# Patient Record
Sex: Female | Born: 1950 | Race: White | Hispanic: No | Marital: Married | Smoking: Never smoker
Health system: Southern US, Community
[De-identification: ages and names within clinical notes are randomized; demographics above are authoritative.]

## PROBLEM LIST (undated history)

## (undated) DIAGNOSIS — H9190 Unspecified hearing loss, unspecified ear: Secondary | ICD-10-CM

## (undated) DIAGNOSIS — M1990 Unspecified osteoarthritis, unspecified site: Secondary | ICD-10-CM

## (undated) DIAGNOSIS — E039 Hypothyroidism, unspecified: Secondary | ICD-10-CM

## (undated) DIAGNOSIS — Z9889 Other specified postprocedural states: Secondary | ICD-10-CM

## (undated) DIAGNOSIS — I428 Other cardiomyopathies: Secondary | ICD-10-CM

## (undated) DIAGNOSIS — I4819 Other persistent atrial fibrillation: Secondary | ICD-10-CM

## (undated) DIAGNOSIS — G629 Polyneuropathy, unspecified: Secondary | ICD-10-CM

## (undated) DIAGNOSIS — K760 Fatty (change of) liver, not elsewhere classified: Secondary | ICD-10-CM

## (undated) DIAGNOSIS — M8580 Other specified disorders of bone density and structure, unspecified site: Secondary | ICD-10-CM

## (undated) DIAGNOSIS — Z953 Presence of xenogenic heart valve: Secondary | ICD-10-CM

## (undated) DIAGNOSIS — K219 Gastro-esophageal reflux disease without esophagitis: Secondary | ICD-10-CM

## (undated) DIAGNOSIS — I351 Nonrheumatic aortic (valve) insufficiency: Secondary | ICD-10-CM

## (undated) DIAGNOSIS — M7580 Other shoulder lesions, unspecified shoulder: Secondary | ICD-10-CM

## (undated) DIAGNOSIS — I071 Rheumatic tricuspid insufficiency: Secondary | ICD-10-CM

## (undated) DIAGNOSIS — I10 Essential (primary) hypertension: Secondary | ICD-10-CM

## (undated) DIAGNOSIS — Q211 Atrial septal defect: Secondary | ICD-10-CM

## (undated) DIAGNOSIS — R7303 Prediabetes: Secondary | ICD-10-CM

## (undated) DIAGNOSIS — K5900 Constipation, unspecified: Secondary | ICD-10-CM

## (undated) DIAGNOSIS — K589 Irritable bowel syndrome without diarrhea: Secondary | ICD-10-CM

## (undated) DIAGNOSIS — I2510 Atherosclerotic heart disease of native coronary artery without angina pectoris: Secondary | ICD-10-CM

## (undated) DIAGNOSIS — F419 Anxiety disorder, unspecified: Secondary | ICD-10-CM

## (undated) DIAGNOSIS — Q2112 Patent foramen ovale: Secondary | ICD-10-CM

## (undated) DIAGNOSIS — E785 Hyperlipidemia, unspecified: Secondary | ICD-10-CM

## (undated) DIAGNOSIS — I340 Nonrheumatic mitral (valve) insufficiency: Secondary | ICD-10-CM

## (undated) DIAGNOSIS — I499 Cardiac arrhythmia, unspecified: Secondary | ICD-10-CM

## (undated) DIAGNOSIS — M19012 Primary osteoarthritis, left shoulder: Secondary | ICD-10-CM

## (undated) DIAGNOSIS — I5042 Chronic combined systolic (congestive) and diastolic (congestive) heart failure: Secondary | ICD-10-CM

## (undated) DIAGNOSIS — M797 Fibromyalgia: Secondary | ICD-10-CM

## (undated) HISTORY — PX: WISDOM TOOTH EXTRACTION: SHX21

## (undated) HISTORY — PX: COLON SURGERY: SHX602

## (undated) HISTORY — DX: Nonrheumatic aortic (valve) insufficiency: I35.1

## (undated) HISTORY — DX: Other cardiomyopathies: I42.8

## (undated) HISTORY — DX: Patent foramen ovale: Q21.12

## (undated) HISTORY — DX: Atrial septal defect: Q21.1

## (undated) HISTORY — DX: Chronic combined systolic (congestive) and diastolic (congestive) heart failure: I50.42

## (undated) HISTORY — PX: TONSILLECTOMY: SUR1361

## (undated) HISTORY — PX: ESOPHAGOGASTRODUODENOSCOPY ENDOSCOPY: SHX5814

## (undated) HISTORY — PX: ABDOMINAL HYSTERECTOMY: SHX81

## (undated) HISTORY — PX: COLONOSCOPY: SHX174

## (undated) HISTORY — PX: TUBAL LIGATION: SHX77

## (undated) HISTORY — DX: Unspecified hearing loss, unspecified ear: H91.90

## (undated) HISTORY — PX: EYE SURGERY: SHX253

## (undated) HISTORY — DX: Rheumatic tricuspid insufficiency: I07.1

---

## 1989-05-01 HISTORY — PX: THYROID SURGERY: SHX805

## 1998-01-29 ENCOUNTER — Ambulatory Visit (HOSPITAL_COMMUNITY): Admission: RE | Admit: 1998-01-29 | Discharge: 1998-01-29 | Payer: Self-pay | Admitting: Obstetrics and Gynecology

## 1998-02-16 ENCOUNTER — Ambulatory Visit (HOSPITAL_BASED_OUTPATIENT_CLINIC_OR_DEPARTMENT_OTHER): Admission: RE | Admit: 1998-02-16 | Discharge: 1998-02-16 | Payer: Self-pay | Admitting: Orthopaedic Surgery

## 1998-03-01 ENCOUNTER — Other Ambulatory Visit: Admission: RE | Admit: 1998-03-01 | Discharge: 1998-03-01 | Payer: Self-pay | Admitting: Obstetrics and Gynecology

## 1998-03-18 ENCOUNTER — Encounter: Admission: RE | Admit: 1998-03-18 | Discharge: 1998-06-16 | Payer: Self-pay | Admitting: Orthopaedic Surgery

## 1998-05-01 HISTORY — PX: OTHER SURGICAL HISTORY: SHX169

## 1998-06-03 ENCOUNTER — Emergency Department (HOSPITAL_COMMUNITY): Admission: EM | Admit: 1998-06-03 | Discharge: 1998-06-03 | Payer: Self-pay | Admitting: Emergency Medicine

## 1998-11-01 ENCOUNTER — Emergency Department (HOSPITAL_COMMUNITY): Admission: EM | Admit: 1998-11-01 | Discharge: 1998-11-01 | Payer: Self-pay | Admitting: Emergency Medicine

## 1998-11-01 ENCOUNTER — Encounter: Payer: Self-pay | Admitting: Emergency Medicine

## 1998-11-07 ENCOUNTER — Emergency Department (HOSPITAL_COMMUNITY): Admission: EM | Admit: 1998-11-07 | Discharge: 1998-11-07 | Payer: Self-pay | Admitting: Emergency Medicine

## 1999-02-11 ENCOUNTER — Encounter: Admission: RE | Admit: 1999-02-11 | Discharge: 1999-02-11 | Payer: Self-pay | Admitting: Internal Medicine

## 1999-04-15 ENCOUNTER — Other Ambulatory Visit: Admission: RE | Admit: 1999-04-15 | Discharge: 1999-04-15 | Payer: Self-pay | Admitting: Obstetrics and Gynecology

## 1999-06-14 ENCOUNTER — Ambulatory Visit (HOSPITAL_COMMUNITY): Admission: RE | Admit: 1999-06-14 | Discharge: 1999-06-14 | Payer: Self-pay | Admitting: Gastroenterology

## 1999-06-14 ENCOUNTER — Encounter (INDEPENDENT_AMBULATORY_CARE_PROVIDER_SITE_OTHER): Payer: Self-pay

## 1999-06-20 ENCOUNTER — Encounter: Payer: Self-pay | Admitting: Gastroenterology

## 1999-06-20 ENCOUNTER — Ambulatory Visit (HOSPITAL_COMMUNITY): Admission: RE | Admit: 1999-06-20 | Discharge: 1999-06-20 | Payer: Self-pay | Admitting: Gastroenterology

## 1999-06-27 ENCOUNTER — Encounter: Payer: Self-pay | Admitting: Gastroenterology

## 1999-06-27 ENCOUNTER — Ambulatory Visit (HOSPITAL_COMMUNITY): Admission: RE | Admit: 1999-06-27 | Discharge: 1999-06-27 | Payer: Self-pay | Admitting: Gastroenterology

## 2000-04-03 ENCOUNTER — Ambulatory Visit (HOSPITAL_COMMUNITY): Admission: RE | Admit: 2000-04-03 | Discharge: 2000-04-03 | Payer: Self-pay | Admitting: Gastroenterology

## 2000-04-03 ENCOUNTER — Encounter: Payer: Self-pay | Admitting: Gastroenterology

## 2000-05-10 ENCOUNTER — Encounter: Payer: Self-pay | Admitting: Vascular Surgery

## 2000-05-14 ENCOUNTER — Ambulatory Visit (HOSPITAL_COMMUNITY): Admission: RE | Admit: 2000-05-14 | Discharge: 2000-05-14 | Payer: Self-pay | Admitting: Vascular Surgery

## 2000-05-14 ENCOUNTER — Encounter (INDEPENDENT_AMBULATORY_CARE_PROVIDER_SITE_OTHER): Payer: Self-pay | Admitting: *Deleted

## 2000-05-25 ENCOUNTER — Other Ambulatory Visit: Admission: RE | Admit: 2000-05-25 | Discharge: 2000-05-25 | Payer: Self-pay | Admitting: Obstetrics and Gynecology

## 2001-06-28 ENCOUNTER — Other Ambulatory Visit: Admission: RE | Admit: 2001-06-28 | Discharge: 2001-06-28 | Payer: Self-pay | Admitting: Obstetrics and Gynecology

## 2001-07-22 ENCOUNTER — Encounter: Payer: Self-pay | Admitting: Obstetrics and Gynecology

## 2001-07-22 ENCOUNTER — Ambulatory Visit (HOSPITAL_COMMUNITY): Admission: RE | Admit: 2001-07-22 | Discharge: 2001-07-22 | Payer: Self-pay | Admitting: Obstetrics and Gynecology

## 2002-07-22 ENCOUNTER — Other Ambulatory Visit: Admission: RE | Admit: 2002-07-22 | Discharge: 2002-07-22 | Payer: Self-pay | Admitting: Obstetrics and Gynecology

## 2002-08-01 ENCOUNTER — Ambulatory Visit (HOSPITAL_COMMUNITY): Admission: RE | Admit: 2002-08-01 | Discharge: 2002-08-01 | Payer: Self-pay | Admitting: Internal Medicine

## 2002-08-01 ENCOUNTER — Encounter: Payer: Self-pay | Admitting: Internal Medicine

## 2002-08-13 ENCOUNTER — Encounter: Payer: Self-pay | Admitting: Obstetrics and Gynecology

## 2002-08-13 ENCOUNTER — Ambulatory Visit (HOSPITAL_COMMUNITY): Admission: RE | Admit: 2002-08-13 | Discharge: 2002-08-13 | Payer: Self-pay | Admitting: Obstetrics and Gynecology

## 2002-09-23 ENCOUNTER — Encounter
Admission: RE | Admit: 2002-09-23 | Discharge: 2002-12-22 | Payer: Self-pay | Admitting: Physical Medicine & Rehabilitation

## 2002-09-24 ENCOUNTER — Encounter: Payer: Self-pay | Admitting: Physical Medicine & Rehabilitation

## 2002-09-24 ENCOUNTER — Ambulatory Visit (HOSPITAL_COMMUNITY)
Admission: RE | Admit: 2002-09-24 | Discharge: 2002-09-24 | Payer: Self-pay | Admitting: Physical Medicine & Rehabilitation

## 2003-01-15 ENCOUNTER — Encounter
Admission: RE | Admit: 2003-01-15 | Discharge: 2003-04-15 | Payer: Self-pay | Admitting: Physical Medicine & Rehabilitation

## 2003-02-03 ENCOUNTER — Encounter
Admission: RE | Admit: 2003-02-03 | Discharge: 2003-05-04 | Payer: Self-pay | Admitting: Physical Medicine & Rehabilitation

## 2003-04-24 ENCOUNTER — Ambulatory Visit (HOSPITAL_COMMUNITY): Admission: RE | Admit: 2003-04-24 | Discharge: 2003-04-24 | Payer: Self-pay | Admitting: Emergency Medicine

## 2003-05-07 ENCOUNTER — Encounter
Admission: RE | Admit: 2003-05-07 | Discharge: 2003-08-05 | Payer: Self-pay | Admitting: Physical Medicine & Rehabilitation

## 2003-07-06 ENCOUNTER — Ambulatory Visit (HOSPITAL_COMMUNITY): Admission: RE | Admit: 2003-07-06 | Discharge: 2003-07-06 | Payer: Self-pay | Admitting: Gastroenterology

## 2003-07-28 ENCOUNTER — Other Ambulatory Visit: Admission: RE | Admit: 2003-07-28 | Discharge: 2003-07-28 | Payer: Self-pay | Admitting: Obstetrics and Gynecology

## 2003-07-29 ENCOUNTER — Ambulatory Visit (HOSPITAL_COMMUNITY): Admission: RE | Admit: 2003-07-29 | Discharge: 2003-07-29 | Payer: Self-pay | Admitting: Obstetrics and Gynecology

## 2003-08-06 ENCOUNTER — Encounter
Admission: RE | Admit: 2003-08-06 | Discharge: 2003-11-04 | Payer: Self-pay | Admitting: Physical Medicine & Rehabilitation

## 2003-08-21 ENCOUNTER — Ambulatory Visit (HOSPITAL_COMMUNITY): Admission: RE | Admit: 2003-08-21 | Discharge: 2003-08-21 | Payer: Self-pay | Admitting: Obstetrics and Gynecology

## 2004-02-04 ENCOUNTER — Encounter
Admission: RE | Admit: 2004-02-04 | Discharge: 2004-05-04 | Payer: Self-pay | Admitting: Physical Medicine & Rehabilitation

## 2004-02-05 ENCOUNTER — Ambulatory Visit: Payer: Self-pay | Admitting: Physical Medicine & Rehabilitation

## 2004-03-30 ENCOUNTER — Encounter: Admission: RE | Admit: 2004-03-30 | Discharge: 2004-03-30 | Payer: Self-pay | Admitting: Internal Medicine

## 2004-04-06 ENCOUNTER — Encounter: Admission: RE | Admit: 2004-04-06 | Discharge: 2004-04-06 | Payer: Self-pay | Admitting: Internal Medicine

## 2004-04-15 ENCOUNTER — Encounter: Admission: RE | Admit: 2004-04-15 | Discharge: 2004-04-15 | Payer: Self-pay | Admitting: Gastroenterology

## 2004-07-04 ENCOUNTER — Ambulatory Visit (HOSPITAL_COMMUNITY): Admission: RE | Admit: 2004-07-04 | Discharge: 2004-07-04 | Payer: Self-pay | Admitting: Internal Medicine

## 2004-08-01 ENCOUNTER — Encounter
Admission: RE | Admit: 2004-08-01 | Discharge: 2004-10-30 | Payer: Self-pay | Admitting: Physical Medicine & Rehabilitation

## 2004-08-04 ENCOUNTER — Other Ambulatory Visit: Admission: RE | Admit: 2004-08-04 | Discharge: 2004-08-04 | Payer: Self-pay | Admitting: Obstetrics and Gynecology

## 2004-11-23 ENCOUNTER — Ambulatory Visit: Payer: Self-pay | Admitting: Physical Medicine & Rehabilitation

## 2004-11-23 ENCOUNTER — Encounter
Admission: RE | Admit: 2004-11-23 | Discharge: 2005-02-21 | Payer: Self-pay | Admitting: Physical Medicine & Rehabilitation

## 2005-01-24 ENCOUNTER — Ambulatory Visit: Payer: Self-pay | Admitting: Physical Medicine & Rehabilitation

## 2005-07-13 ENCOUNTER — Ambulatory Visit: Payer: Self-pay | Admitting: Physical Medicine & Rehabilitation

## 2005-07-13 ENCOUNTER — Encounter
Admission: RE | Admit: 2005-07-13 | Discharge: 2005-10-11 | Payer: Self-pay | Admitting: Physical Medicine & Rehabilitation

## 2005-10-13 ENCOUNTER — Ambulatory Visit: Payer: Self-pay | Admitting: Physical Medicine & Rehabilitation

## 2005-10-13 ENCOUNTER — Encounter
Admission: RE | Admit: 2005-10-13 | Discharge: 2006-01-11 | Payer: Self-pay | Admitting: Physical Medicine & Rehabilitation

## 2006-02-14 ENCOUNTER — Encounter: Admission: RE | Admit: 2006-02-14 | Discharge: 2006-02-14 | Payer: Self-pay | Admitting: Gastroenterology

## 2006-02-22 ENCOUNTER — Ambulatory Visit: Payer: Self-pay | Admitting: Physical Medicine & Rehabilitation

## 2006-02-22 ENCOUNTER — Encounter
Admission: RE | Admit: 2006-02-22 | Discharge: 2006-05-23 | Payer: Self-pay | Admitting: Physical Medicine & Rehabilitation

## 2006-08-08 ENCOUNTER — Encounter
Admission: RE | Admit: 2006-08-08 | Discharge: 2006-11-06 | Payer: Self-pay | Admitting: Physical Medicine & Rehabilitation

## 2006-09-05 ENCOUNTER — Ambulatory Visit: Payer: Self-pay | Admitting: Physical Medicine & Rehabilitation

## 2006-11-29 ENCOUNTER — Encounter
Admission: RE | Admit: 2006-11-29 | Discharge: 2007-02-27 | Payer: Self-pay | Admitting: Physical Medicine & Rehabilitation

## 2007-01-14 ENCOUNTER — Ambulatory Visit: Payer: Self-pay | Admitting: Physical Medicine & Rehabilitation

## 2007-02-17 ENCOUNTER — Emergency Department (HOSPITAL_COMMUNITY): Admission: EM | Admit: 2007-02-17 | Discharge: 2007-02-18 | Payer: Self-pay | Admitting: Emergency Medicine

## 2007-05-31 ENCOUNTER — Encounter: Admission: RE | Admit: 2007-05-31 | Discharge: 2007-05-31 | Payer: Self-pay | Admitting: Internal Medicine

## 2007-06-21 ENCOUNTER — Inpatient Hospital Stay (HOSPITAL_COMMUNITY): Admission: EM | Admit: 2007-06-21 | Discharge: 2007-06-25 | Payer: Self-pay | Admitting: Emergency Medicine

## 2007-06-24 ENCOUNTER — Encounter (INDEPENDENT_AMBULATORY_CARE_PROVIDER_SITE_OTHER): Payer: Self-pay | Admitting: Cardiology

## 2007-07-08 ENCOUNTER — Encounter
Admission: RE | Admit: 2007-07-08 | Discharge: 2007-07-09 | Payer: Self-pay | Admitting: Physical Medicine & Rehabilitation

## 2007-07-08 ENCOUNTER — Ambulatory Visit: Payer: Self-pay | Admitting: Physical Medicine & Rehabilitation

## 2007-07-15 ENCOUNTER — Encounter: Admission: RE | Admit: 2007-07-15 | Discharge: 2007-07-15 | Payer: Self-pay | Admitting: Endocrinology

## 2007-07-19 ENCOUNTER — Encounter: Admission: RE | Admit: 2007-07-19 | Discharge: 2007-07-19 | Payer: Self-pay | Admitting: Endocrinology

## 2007-07-26 ENCOUNTER — Ambulatory Visit: Payer: Self-pay | Admitting: Pulmonary Disease

## 2007-07-26 ENCOUNTER — Inpatient Hospital Stay (HOSPITAL_COMMUNITY): Admission: AD | Admit: 2007-07-26 | Discharge: 2007-08-04 | Payer: Self-pay | Admitting: Interventional Cardiology

## 2007-07-31 ENCOUNTER — Encounter (INDEPENDENT_AMBULATORY_CARE_PROVIDER_SITE_OTHER): Payer: Self-pay | Admitting: Pulmonary Disease

## 2007-09-11 ENCOUNTER — Encounter
Admission: RE | Admit: 2007-09-11 | Discharge: 2007-09-11 | Payer: Self-pay | Admitting: Physical Medicine & Rehabilitation

## 2007-09-11 ENCOUNTER — Ambulatory Visit: Payer: Self-pay | Admitting: Physical Medicine & Rehabilitation

## 2007-11-18 ENCOUNTER — Emergency Department (HOSPITAL_COMMUNITY): Admission: EM | Admit: 2007-11-18 | Discharge: 2007-11-18 | Payer: Self-pay | Admitting: Emergency Medicine

## 2008-01-24 ENCOUNTER — Encounter
Admission: RE | Admit: 2008-01-24 | Discharge: 2008-01-27 | Payer: Self-pay | Admitting: Physical Medicine & Rehabilitation

## 2008-01-27 ENCOUNTER — Ambulatory Visit: Payer: Self-pay | Admitting: Physical Medicine & Rehabilitation

## 2008-04-20 ENCOUNTER — Encounter
Admission: RE | Admit: 2008-04-20 | Discharge: 2008-04-20 | Payer: Self-pay | Admitting: Physical Medicine & Rehabilitation

## 2008-04-20 ENCOUNTER — Ambulatory Visit: Payer: Self-pay | Admitting: Physical Medicine & Rehabilitation

## 2008-06-20 ENCOUNTER — Encounter: Admission: RE | Admit: 2008-06-20 | Discharge: 2008-06-20 | Payer: Self-pay | Admitting: Orthopedic Surgery

## 2008-06-29 ENCOUNTER — Encounter: Admission: RE | Admit: 2008-06-29 | Discharge: 2008-06-29 | Payer: Self-pay | Admitting: Orthopedic Surgery

## 2008-08-14 ENCOUNTER — Encounter
Admission: RE | Admit: 2008-08-14 | Discharge: 2008-08-18 | Payer: Self-pay | Admitting: Physical Medicine & Rehabilitation

## 2008-08-18 ENCOUNTER — Ambulatory Visit: Payer: Self-pay | Admitting: Physical Medicine & Rehabilitation

## 2008-09-29 ENCOUNTER — Encounter: Admission: RE | Admit: 2008-09-29 | Discharge: 2008-09-29 | Payer: Self-pay | Admitting: Internal Medicine

## 2009-05-10 ENCOUNTER — Encounter
Admission: RE | Admit: 2009-05-10 | Discharge: 2009-08-08 | Payer: Self-pay | Admitting: Physical Medicine & Rehabilitation

## 2009-05-12 ENCOUNTER — Ambulatory Visit: Payer: Self-pay | Admitting: Physical Medicine & Rehabilitation

## 2009-09-02 ENCOUNTER — Encounter
Admission: RE | Admit: 2009-09-02 | Discharge: 2009-09-03 | Payer: Self-pay | Admitting: Physical Medicine & Rehabilitation

## 2009-09-03 ENCOUNTER — Ambulatory Visit: Payer: Self-pay | Admitting: Physical Medicine & Rehabilitation

## 2009-11-16 ENCOUNTER — Encounter
Admission: RE | Admit: 2009-11-16 | Discharge: 2009-11-24 | Payer: Self-pay | Admitting: Physical Medicine & Rehabilitation

## 2009-11-24 ENCOUNTER — Ambulatory Visit: Payer: Self-pay | Admitting: Physical Medicine & Rehabilitation

## 2010-03-11 ENCOUNTER — Encounter
Admission: RE | Admit: 2010-03-11 | Discharge: 2010-03-11 | Payer: Self-pay | Source: Home / Self Care | Attending: Physical Medicine & Rehabilitation | Admitting: Physical Medicine & Rehabilitation

## 2010-04-12 ENCOUNTER — Ambulatory Visit: Payer: Self-pay | Admitting: Physical Medicine & Rehabilitation

## 2010-05-22 ENCOUNTER — Encounter: Payer: Self-pay | Admitting: Gastroenterology

## 2010-06-08 ENCOUNTER — Ambulatory Visit (HOSPITAL_BASED_OUTPATIENT_CLINIC_OR_DEPARTMENT_OTHER): Payer: Medicare Other | Admitting: Physical Medicine & Rehabilitation

## 2010-06-08 ENCOUNTER — Encounter: Payer: MEDICARE | Attending: Physical Medicine & Rehabilitation

## 2010-06-08 DIAGNOSIS — M76899 Other specified enthesopathies of unspecified lower limb, excluding foot: Secondary | ICD-10-CM

## 2010-06-08 DIAGNOSIS — F3289 Other specified depressive episodes: Secondary | ICD-10-CM | POA: Insufficient documentation

## 2010-06-08 DIAGNOSIS — M47812 Spondylosis without myelopathy or radiculopathy, cervical region: Secondary | ICD-10-CM

## 2010-06-08 DIAGNOSIS — M542 Cervicalgia: Secondary | ICD-10-CM | POA: Insufficient documentation

## 2010-06-08 DIAGNOSIS — M5382 Other specified dorsopathies, cervical region: Secondary | ICD-10-CM

## 2010-06-08 DIAGNOSIS — IMO0001 Reserved for inherently not codable concepts without codable children: Secondary | ICD-10-CM | POA: Insufficient documentation

## 2010-06-08 DIAGNOSIS — F329 Major depressive disorder, single episode, unspecified: Secondary | ICD-10-CM | POA: Insufficient documentation

## 2010-06-08 DIAGNOSIS — Z9181 History of falling: Secondary | ICD-10-CM | POA: Insufficient documentation

## 2010-06-08 DIAGNOSIS — R51 Headache: Secondary | ICD-10-CM | POA: Insufficient documentation

## 2010-08-03 ENCOUNTER — Encounter: Payer: MEDICARE | Attending: Physical Medicine & Rehabilitation | Admitting: Physical Medicine & Rehabilitation

## 2010-08-03 DIAGNOSIS — IMO0001 Reserved for inherently not codable concepts without codable children: Secondary | ICD-10-CM | POA: Insufficient documentation

## 2010-08-03 DIAGNOSIS — M545 Low back pain, unspecified: Secondary | ICD-10-CM | POA: Insufficient documentation

## 2010-08-03 DIAGNOSIS — M47812 Spondylosis without myelopathy or radiculopathy, cervical region: Secondary | ICD-10-CM

## 2010-08-03 DIAGNOSIS — Z79899 Other long term (current) drug therapy: Secondary | ICD-10-CM | POA: Insufficient documentation

## 2010-08-03 DIAGNOSIS — F329 Major depressive disorder, single episode, unspecified: Secondary | ICD-10-CM | POA: Insufficient documentation

## 2010-08-03 DIAGNOSIS — M76899 Other specified enthesopathies of unspecified lower limb, excluding foot: Secondary | ICD-10-CM | POA: Insufficient documentation

## 2010-08-03 DIAGNOSIS — F3289 Other specified depressive episodes: Secondary | ICD-10-CM | POA: Insufficient documentation

## 2010-08-03 DIAGNOSIS — M7530 Calcific tendinitis of unspecified shoulder: Secondary | ICD-10-CM

## 2010-08-03 DIAGNOSIS — M25519 Pain in unspecified shoulder: Secondary | ICD-10-CM | POA: Insufficient documentation

## 2010-08-15 ENCOUNTER — Other Ambulatory Visit: Payer: Self-pay | Admitting: Physical Medicine & Rehabilitation

## 2010-08-15 DIAGNOSIS — M25512 Pain in left shoulder: Secondary | ICD-10-CM

## 2010-08-18 ENCOUNTER — Ambulatory Visit (HOSPITAL_COMMUNITY)
Admission: RE | Admit: 2010-08-18 | Discharge: 2010-08-18 | Disposition: A | Discharge status: Home / Self Care | Payer: MEDICARE | Source: Ambulatory Visit | Attending: Physical Medicine & Rehabilitation | Admitting: Physical Medicine & Rehabilitation

## 2010-08-18 DIAGNOSIS — M719 Bursopathy, unspecified: Secondary | ICD-10-CM | POA: Insufficient documentation

## 2010-08-18 DIAGNOSIS — M25519 Pain in unspecified shoulder: Secondary | ICD-10-CM | POA: Insufficient documentation

## 2010-08-18 DIAGNOSIS — M67919 Unspecified disorder of synovium and tendon, unspecified shoulder: Secondary | ICD-10-CM | POA: Insufficient documentation

## 2010-08-18 DIAGNOSIS — M25419 Effusion, unspecified shoulder: Secondary | ICD-10-CM | POA: Insufficient documentation

## 2010-08-18 DIAGNOSIS — M25512 Pain in left shoulder: Secondary | ICD-10-CM

## 2010-08-26 ENCOUNTER — Encounter: Payer: MEDICARE | Attending: Neurosurgery | Admitting: Neurosurgery

## 2010-08-26 DIAGNOSIS — M719 Bursopathy, unspecified: Secondary | ICD-10-CM | POA: Insufficient documentation

## 2010-08-26 DIAGNOSIS — M549 Dorsalgia, unspecified: Secondary | ICD-10-CM | POA: Insufficient documentation

## 2010-08-26 DIAGNOSIS — M25519 Pain in unspecified shoulder: Secondary | ICD-10-CM | POA: Insufficient documentation

## 2010-08-26 DIAGNOSIS — M67919 Unspecified disorder of synovium and tendon, unspecified shoulder: Secondary | ICD-10-CM | POA: Insufficient documentation

## 2010-08-26 DIAGNOSIS — M5380 Other specified dorsopathies, site unspecified: Secondary | ICD-10-CM | POA: Insufficient documentation

## 2010-08-26 DIAGNOSIS — R209 Unspecified disturbances of skin sensation: Secondary | ICD-10-CM | POA: Insufficient documentation

## 2010-08-26 DIAGNOSIS — M542 Cervicalgia: Secondary | ICD-10-CM | POA: Insufficient documentation

## 2010-08-26 DIAGNOSIS — M25529 Pain in unspecified elbow: Secondary | ICD-10-CM

## 2010-08-26 DIAGNOSIS — R5381 Other malaise: Secondary | ICD-10-CM | POA: Insufficient documentation

## 2010-08-26 DIAGNOSIS — M25419 Effusion, unspecified shoulder: Secondary | ICD-10-CM | POA: Insufficient documentation

## 2010-08-26 DIAGNOSIS — K5900 Constipation, unspecified: Secondary | ICD-10-CM | POA: Insufficient documentation

## 2010-08-26 DIAGNOSIS — R5383 Other fatigue: Secondary | ICD-10-CM | POA: Insufficient documentation

## 2010-08-26 DIAGNOSIS — M19029 Primary osteoarthritis, unspecified elbow: Secondary | ICD-10-CM

## 2010-08-27 NOTE — Assessment & Plan Note (Signed)
Account [id number].  This is a patient of Dr. Riley Kill who has been seen for some time, looks like lastly in early April with some shoulder problems.  She had had a fall in [employer] prior to that time, and she had had resulting left shoulder pain, back pain, and some neck pain.  She does have an attorney that has represented her.  She rates her pain today at about 7, varies from time to time, her general activity level is about 2.  Pain is worse in the morning and in the evening.  She sleeps pretty good.  Pain is worse with most of all activities.  Rest and medications tend to help.  She is disabled.  REVIEW OF SYSTEMS:  Notable for some weight loss, constipation, tingling, spasms in her back, and some weakness, otherwise unremarkable.  Primary physician is Dr. Rene Paci.  Orthopedist, Dr. Prince Rome.  Social history and family history unchanged.  PHYSICAL EXAMINATION:  VITAL SIGNS:  Blood pressure 124/43.  Pulse 63, respirations 18, O2 sats 98% on room air. MUSCULOSKELETAL:  Shows her to be about 3/5 strength in the upper extremities.  She gives way to pain.  She has a very decreased range of motion bilaterally in her shoulders.  She cannot abduct her arms very well laterally, reaching behind her is very painful. NEUROLOGICAL:  She is alert and oriented x3.  Her affect appears to be bright.  Gait is normal.  Constitutionally, she is within normal limits.  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  The patient did undergo an MRI on August 18, 2010, which shows rotator cuff tendon is intact.  She has mild infraspinatus and supraspinatus tendinopathy and tendinosis.  The long head of the biceps tendon is intact.  There are some degenerative changes without any discrete labral tears.  There is moderate-sized glenohumeral joint effusion and shows nodular thickening with a significant synovial mass surrounding biceps tendon and the bicipital groove, this is on the left.  Also the  findings could be suspicious for pigmented villonodular synovitis.  This also could be a sequelae of hemarthrosis.  AC joint degenerative changes lateral downsloping the acromion may be causing some impingement, mild rotator cuff tendinopathy, but no tear.  Again, glenohumeral joint effusion and synovial thickening.  She is requested to go back to see Dr. Lavada Mesi for all of this as he is her orthopedist and has been in the past.  We will go ahead and refer her back over to him.  If she has any difficulty in the interim, she is to call, otherwise, we will see her back in a month as scheduled.  She would like a copy of this sent to her attorney who is Larence Penning.     Carey Johndrow L. Blima Dessert    RLW/MedQ D:  08/26/2010 14:59:56  T:  08/27/2010 04:58:07  Job #:  [id number]

## 2010-08-30 NOTE — Assessment & Plan Note (Signed)
Allison [name] is back regarding her multiple pain issues, particularly her problems after her fall.  Therapy has been working on her neck and shoulders.  She has noted increasing pain in the left shoulder and is having problems with lifting the left arm.  She reports pain radiating from the arm down to the hand.  She is having some similar symptoms in the right but less severe.  Pain is sharp, stabbing, and aching.  Her pain medication is not helping her with her symptoms.  REVIEW OF SYSTEMS:  Notable for spasm, tingling, numbness, weakness, weight gain, constipation.  Full 12-point review is in the written health and history section of the chart.  SOCIAL HISTORY:  Unchanged.  She remains married.  PHYSICAL EXAMINATION:  VITAL SIGNS:  Blood pressure is 146/49, pulse 63, respiratory rate 18, she is satting 97% on room air. NEUROLOGIC:  The patient has some pain in her left upper trapezius on the left side particularly at the C4-C5 levels.  She is less tender and spastic on the right.  Rotation and forward flexion tend to exacerbate her neck symptoms.  Left shoulder was painful with apprehension testing as well as rotator cuff impingement testing as she had a hard time lifting the left arm in abduction.  Right arm was slightly tender to palpation over the acromion a subacromial space.  She was able to lift the arm more comfortably and had some mild pain with impingement testing.  Bicipital tendons may have been tender on the left side, lesser so on the right.  Both shoulders were diffusely tender as a whole.  Spurling test was negative.  Reflexes were 2+ and besides her shoulder strength was 5/5 in all upper extremity muscle testing.  ASSESSMENT: 1. Fibromyalgia syndrome. 2. Depression. 3. Recent fall with head trauma, whiplash injury.  The patient with     myofascial pain on the left trapezius muscle today.  She may have     some underlying facet arthropathy as well. 4. History of bilateral  greater trochanter bursitis. 5. History of low back pain. 6. Bilateral shoulder pain, left much more than right.  Exam on the     left side is concerning for rotator cuff tear today.  Symptoms are     less severe in the right and more likely only a tendonitis and     bursitis.  PLAN: 1. After informed consent, I injected both shoulders via lateral     approach of 40 mg Kenalog and 3 mL of 1% lidocaine.  The patient     tolerated it well and had some pain relief when she left the office     today. 2. I injected the left trapezius at 2 separate locations with trigger     point injections.  We used 2 mL of 1% lidocaine at each location.     The patient tolerated it well, had no adverse responses and really     did a good job with her injections today. 3. We will increase Celebrex temporarily to b.i.d.  She uses her     narcotic medication including hydrocodone for breakthrough     symptoms. 4. I will send her for an MRI of the left shoulder without contrast to     assess the rotator cuff region and to look for     any tears.  I asked that therapy hold off on exercises to that area     until we receive the results of her test. 5. I will  see her back pending the above.     Ranelle Oyster, M.D. Electronically Signed    ZTS/MedQ D:  08/03/2010 12:27:59  T:  08/04/2010 01:37:58  Job #:  [id number]

## 2010-09-13 NOTE — Assessment & Plan Note (Signed)
Allison [name] is back regarding her fibromyalgia.  Her main complaints were  ongoing hip pain as well as swelling in the legs.  She has had some  persistent nausea for which she is seeing GI at [facility] and still  has no diagnosis.  She is using Darvocet or Vicodin for breakthrough  pain.  She also likes Celebrex for days when she is more achy.  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  She states that she  talked to her family doctor about the lower extremity swelling and it  has really happened more recently and then once earlier in the summer,  perhaps in June.  Elevation and local measures were recommended.   REVIEW OF SYSTEMS:  Notable for the above.  The patient reports  occasional tingling in the legs as well as constipation at times, some  coughing.  Other pertinent positives are above.   SOCIAL HISTORY:  The patient is married and husband is supportive.   PHYSICAL EXAMINATION:  Blood pressure is 152/57, pulse is 99,  respiratory rate is 20.  Her saturation is 97% on room air.  The patient  is pleasant.  Alert and oriented x3.  Affect is bright and appropriate.  Gait is stable.  She has some ongoing tenderness in the bilateral  greater trochanter regions.  She has good extremity strength and  sensation throughout.  She has trace 1+ edema of both lower extremities  of a pitting type.  No spasticity is seen in the legs.  Cognitively she  is appropriate.  HEART:  Regular.  CHEST:  Clear.  ABDOMEN:  Soft, nontender.   ASSESSMENT:  1. Fibromyalgia syndrome.  2. Depression.  3. Myofascial cervical pain.  4. Bilateral greater trochanteric bursitis.  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  6. History of plantar fasciitis.  7. Lower extremity edema.   PLAN:  1. Discussed preventative measures with her legs including elevation,      TED stockings, etc.  The Celebrex certainly may add to this as      well.  2. After informed consent we injected both hips  via the lateral      approach using 40 mg of Kenalog and 3 mL of 1% lidocaine.  The      patient tolerated it well.  3. Continue activity and range of motion as tolerated.  4. I will see her back in about 6 months for follow up.  In general      she is doing fairly well.      Ranelle Oyster, M.D.  Electronically Signed     ZTS/MedQ  D:  01/14/2007 11:02:40  T:  01/14/2007 11:27:43  Job #:  [id number]

## 2010-09-13 NOTE — Assessment & Plan Note (Signed)
Allison [name] is back regarding multiple pain complaints.  She states that she saw  Dr. Lajoyce Corners recently for increased low back pain.  He states that her pain  in her back and leg are due to the back issues.  There was also some  question of her cervical problems as well.  Apparently MRI was done in  both areas.  She has not been able to pursue a lot of her activities as  she usually does.  She has complained of lot of anxiety and depression  recently.  She ran out of her Zoloft about 2 weeks ago or so.  The  patient's pain score today was 6/10.  Pain is described as burning and  aching.  Burning specifically is notable in her feet.  She uses  Neurontin here and there for pain as well as Vicodin.  We injected her  hips on many occasions in the past.   REVIEW OF SYSTEMS:  The patient reports tingling, spasms, anxiety,  constipation, nausea, shortness of breath, and weight gain.  Other  pertinent positives as above.  Full review is in the written health and  history section of the chart.   SOCIAL HISTORY:  The patient is married, and she and her husband are  looking for a new home.   PHYSICAL EXAMINATION:  VITAL SIGNS:  Blood pressure is 152/71, pulse is  58, respiratory rate 18, and she is sating 96% on room air.  NEUROLOGIC:  The patient is pleasant, alert, and oriented x3.  Affect is  bright and appropriate.  Cognitively she is intact.  She is very anxious  and tearful at times in the office today.  MUSCULOSKELETAL:  Gait is generally stable.  No obvious antalgia.  She  had some pain over the PSIS areas bilaterally and Allison [name] test was  equivocal and positive.  Compression test was equivocal.  She had  essentially normal range of motion in the legs, although pain with  hamstring stretching.  Straight leg testing was negative.  Strength is  5/5 with 2+ reflexes and normal sensation in both legs.  Posture is  fair.  She has gained a bit of weight, but not much over her baseline.  HEART:  Regular.  CHEST:  Clear.  ABDOMEN:  Soft and nontender.   ASSESSMENT:  1. Fibromyalgia syndrome.  2. Depression.  3. Hypothyroidism.  4. Bilateral trochanteric bursitis.  5. Low back pain with likely spondylosis and degenerative disk      disease.  I think a lot of her problems are also made related to      sacroiliitis.   PLAN:  1. I would like to get the patient in physical therapy.  First we need      to review her MRI studies.  We will ask Dr. Lajoyce Corners for these.  2. Resume Zoloft 100 mg daily.  3. I would like the patient to resume her Neurontin 100 mg b.i.d.      schedule.  We described that today.  4. I will see her back in about a month's time.  I think certainly      that her new pain in the back and legs is correctable situation to      a certain extent.  Lot of her pain is appearing to be slight      anxiety mediated.     Ranelle Oyster, M.D.  Electronically Signed    ZTS/MedQ  D:  08/18/2008 13:12:37  T:  08/19/2008 02:14:17  Job #:  [id number] 

## 2010-09-13 NOTE — Consult Note (Signed)
Allison [name], Allison [name]              ACCOUNT NO.:  [id number]   MEDICAL RECORD NO.:  [id number]          PATIENT TYPE:  INP   LOCATION:  2925                         FACILITY:  [facility]   PHYSICIAN:  Corky Crafts, MDDATE OF BIRTH:  1951-02-10   DATE OF CONSULTATION:  DATE OF DISCHARGE:                                 CONSULTATION   REASON FOR CONSULTATION:  1. Atrial fibrillation with rapid ventricular response.  2. Thyroid disease.  3. Hypertension.   HISTORY OF PRESENT ILLNESS:  The patient is a 60 year old woman who has  no known prior cardiac history.  She had been experiencing fatigue and  dyspnea for the past 2 weeks. She has also been feeling palpitations.  She saw Dr. Donette Larry in the office today because of problems related to  irritable bowel syndrome.  She had an irregular heart beat on exam and  an EKG showed atrial fibrillation with rapid ventricular response.  Her  ventricular rate was about 160 beats per minute.  She was sent to the  emergency room.  We are asked to see her in consultation   She notes intermittently that she has had palpitations for really a few  months and even more rarely palpitations longer than that.  She has not  had chest pain.  She was scheduled to have her thyroid evaluated by Dr.  Zachery Dakins.  She has had a partial thyroidectomy in the past.  Currently  she is not short of breath.  She does feel her heart racing and her  blood pressure is elevated although she does not carry a diagnosis of  high blood pressure.   PAST MEDICAL HISTORY:  1. Congenital hearing loss t.i.d.  2. IBS.  3. GERD.  4. Partial thyroidectomy in 1991.  5. Bilateral cataract removal.   ALLERGIES:  1. SULFA.  2. LEVAQUIN.  3. BIAXIN.  4. MACROBID.  5. TOPAMAX.  6. DOXYCYCLINE.  7. NSAIDS.  8. EFFEXOR.  9. TRAZODONE.  10.LEXAPRO.  11.MIRTAZAPINE.   MEDICATIONS:  Caltrate, Benadryl, Tums, lorazepam p.r.n., Ambien p.r.n.,  Darvocet p.r.n., Vicodin p.r.n.,  Celebrex p.r.n.,  Cymbalta, Xanax,  Phenergan,  B12, Mucinex nasal spray, Sudafed cough suppressant, Nexium  40 mg a day, Singulair, Flexeril, Nasocort, hyoscyamine   SOCIAL HISTORY:  She does not smoke.  She does not drink.  She does not  use any illegal drugs.   FAMILY HISTORY:  Father had a history of an irregular heartbeat.   REVIEW OF SYSTEMS:  Significant for the palpitations, fatigue, and  abnormal bowel pattern. No bleeding problems.  No focal weakness.  No  rash.  No chest pain.  No orthopnea.  No PND.  All other systems  negative.   PHYSICAL EXAMINATION:  Blood pressure 150/55, heart rate ranging  anywhere from 160 to 200 beats a minute, respiratory rate of 20.  GENERAL:  She is awake, alert, in no apparent distress.  HEAD:  Normocephalic, atraumatic.  EYES:  Extraocular movements intact.  NECK:  No JVD.  CARDIOVASCULAR:  Tachycardiac, S1-S2.  LUNGS:  Clear to auscultation bilaterally.  ABDOMEN:  Soft, mild tenderness with palpation.  No rebound.  No  guarding.  EXTREMITIES:  Showed trace pretibial edema.  NEURO:  No focal motor deficits.  She is deaf and communicates with the  help of sign language as well as lip breathing.  SKIN:  No rash.  BACK:  No kyphosis.  PSYCHIATRIC:  She is anxious.   EKG shows atrial fibrillation with rapid ventricular response.  Nonspecific ST-segment changes.   Lab work is pending.   Chest x-ray is pending.   MEDICAL DECISION MAKING:  A 60 year old with atrial fibrillation with  rapid ventricular response.   PLAN:  1. For rate control she is on IV Cardizem.  She did respond somewhat      to a dose of IV Lopressor. Will give her metoprolol 25 mg b.i.d.  2. In terms of anticoagulation, she is on heparin.  She actually may      be a long-term aspirin candidate given her young age and lack of      risk factors.  I will have to see what her happens with her blood      pressure; for now would use aspirin 325 mg.  3. She needs her  thyroid function checked.  Certainly this atrial      fibrillation could be in response to problems with her thyroid.  4. Use Cardizem for blood pressure control for now.  5. Irritable bowel syndrome symptoms per Dr. Donette Larry.  6. We also counseled her in terms of not taking stimulants such as      Sudafed.  7. She should be in a step-down unit.      Corky Crafts, MD  Electronically Signed     JSV/MEDQ  D:  06/21/2007  T:  06/22/2007  Job:  [id number]

## 2010-09-13 NOTE — H&P (Signed)
Allison [name], Allison [name]              ACCOUNT NO.:  [id number]   MEDICAL RECORD NO.:  [id number]          PATIENT TYPE:  INP   LOCATION:  2925                         FACILITY:  [facility]   PHYSICIAN:  [name], MD      DATE OF BIRTH:  08-21-1950   DATE OF ADMISSION:  06/21/2007  DATE OF DISCHARGE:  04/02/2007                              HISTORY & PHYSICAL   CHIEF COMPLAINT:  Shortness of breath and palpitations.  The patient is  60 year old, was seen in the office the first time, past medical history  of IBS, GERD, fibromyalgia and anxiety and congenital hearing deficit,  comes in with her mom.  She has been having some shortness of breath and  heart beating real fast.  She said it went on, on and off for some time  and also has history of longstanding GI problems with vomiting, and when  she bends down.  Extensive workup done in the past unremarkable, as far  as in the office, her heart rate of 160s. No chest pain, no fevers, no  cough, no abdominal pain, admitted for evaluation and for her atrial  fibrillation.   PAST MEDICAL HISTORY:  She has had medication list:  1. Lorazepam 2 mg at bedtime p.r.n.  2. Darvocet 100 p.r.n.  3. Betonica 25 mg as needed for urination.  4. Climara 0.075 once a week.  5. Nexium 40 mg daily.  6. Singulair 10 mg daily.  7. Celebrex 200 mg mg p.r.n.  8. Cymbalta 60 mg daily.  9. Levsin p.r.n.  10.Nasacort nasal spray.   PAST SURGICAL HISTORY:  Hysterectomy, partial thyroid surgery 91,  cataract removed, tubal ligation, and tonsillectomy.   ALLERGIES:  AS FAR AS ALLERGIES.  SHE HAS ALLERGIES TO SULFA, LEVAQUIN,  BIAXIN AND MACROBID.  SHE IS INTOLERANT TO NONSTEROIDALS AND TOPAMAX AND  TRAZODONE AND SSRIS.   REVIEW OF SYSTEMS:  As above.   EXAM:  VITAL SIGNS:  Blood pressure 130/80, heart rate of 160,  temperature 92, pulse ox 98% on room air, weight of 174 pounds.  GENERAL:  Well-appearing female with congenital hearing loss with little  difficult to understand at times.  LUNGS:  Was clear.  CARDIOLOGY:  Irregular, normal S1, S2, tachy.  No JVD.  ABDOMEN:  Soft  without tenderness.  Extremities: No edema.  No thyromegaly.  She has  scar from thyroid surgery.   LAB DATA:  Will get blood chemistries, thyroid, CBC and cardiac markers.  EKG showed atrial fibrillation with rate of 165 with a-fib.   ASSESSMENT/PLAN:  1. Shortness of breath and palpitation with atrial fibrillation, rapid      ventricular  response.  2. Irritable bowel syndrome and gastroesophageal reflux disease.  3. Fibromyalgia.  4. Anxiety.   PLAN:  Admit to telemetry at [facility].  Will start him on diltiazem  drip to control the rate, IV heparin, get cardiac markers and blood  work, check a thyroid, cardiology consult, possibly need an echo.  On  the IBS and GERD, continue on Nexium and fibromyalgia, continue on  Cymbalta and lorazepam.  I have discussed  with cardiology Dr. Eldridge Dace,  will see the patient this afternoon.      [name], MD  Electronically Signed     KH/MEDQ  D:  06/21/2007  T:  06/22/2007  Job:  [id number]

## 2010-09-13 NOTE — Assessment & Plan Note (Signed)
Allison [name] is back regarding her fibromyalgia and chronic hip pain.  She  continues to have hip pain in the greater trochanter regions  bilaterally.  This tends to flare up every 3-4 months or so.  She has  had back spasms flare after shoveling snow on Saturday and is still a  bit sore there.  We talked about stretching exercises, and she really is  not stretching on a regular basis in an organized manner.  The patient  rates her pain at 5/10, described as constant and aching.  The pain  interferes with general activities, relationship with others, and  enjoyment of life on a moderate level.  Sleep is fair.  She uses Vicodin  for breakthrough pain.   REVIEW OF SYSTEMS:  Notable for numbness, tingling, spasms, anxiety,  some weight gain, and shortness of breath.  Other pertinent  positives  are above.  Full review is in the written health and history section in  the chart.   SOCIAL HISTORY:  The patient is married and living with her husband.   PHYSICAL EXAMINATION:  VITAL SIGNS:  Blood pressure is 132/71, pulse is  59, respiratory rate 18, and she is sating 96% on room air.  GENERAL:  The patient is pleasant, alert, and oriented x3.  MUSCULOSKELETAL:  She walks with a normal gait.  She is able to nearly  bend her waist to 90 degrees.  She had some pain in both PSIS areas and  lower lumbar paraspinals.  Both greater trochanter regions were tender  with palpation and cross-legged maneuvers.  Strength remains 5/5 and  sensation is normal.  Cognitively, she is intact.  HEART:  Regular.  CHEST:  Clear.  ABDOMEN:  Soft and nontender.   ASSESSMENT:  1. Fibromyalgia syndrome.  2. Depression.  3. Hypothyroidism.  4. Bilateral trochanteric bursitis.  5. Low back muscle strain.   PLAN:  1. After informed consent, we injected both the greater trochanters      today with 3 mL of 1% lidocaine and 40 mg of Kenalog.  The patient      tolerated it well.  2. Continue Vicodin for breakthrough  pain.  3. I will see her back in 3 months' time.  In general, she is doing      very well.  I did give her a formal exercise to try for her back      and core muscles.  She may need a formal therapy to reiterate.      Ranelle Oyster, M.D.  Electronically Signed     ZTS/MedQ  D:  04/20/2008 12:37:48  T:  04/21/2008 02:40:55  Job #:  [id number]

## 2010-09-13 NOTE — Assessment & Plan Note (Signed)
Kinsleigh is back regarding her fibromyalgia and chronic hip pain.  She has  had some pain in her neck over the last week or so after repositioning  in bed due to upper respiratory tract infection.  The pain usually is  over the shoulder and lower neck region.  It is most often centralized  without pain into either shoulder.  Her hips are beginning to bother her  a bit again.  She has been working in the yard quite a lot and thinks  this has exacerbated symptoms.   REVIEW OF SYSTEMS:  Notable for the above.  She still has some anxiety,  recent weight gain, occasional constipation, and her upper respiratory  symptoms.  Otherwise she is stable.   SOCIAL HISTORY:  Unchanged.  She is married and doing well with her  husband.   PHYSICAL EXAMINATION:  VITAL SIGNS:  Blood pressure is 114/52, pulse is  54, respiratory rate 20, and she is saturating 99% on room air.  GENERAL:  The patient is pleasant, alert and oriented x3.  Affect is  bright and appropriate.  She has some pain over both greater trochanters  with radiation into the knees.  No pain with straight leg raising or  ranging of the back.  She has some pain over the spinous process of C6-7  without obvious muscle spasm or taut bands appreciated today.  She does  have a head forward posture and rotation internally of the shoulders.  HEART:  Regular.  CHEST:  Clear.  ABDOMEN:  Soft and nontender.   ASSESSMENT:  1. Fibromyalgia syndrome.  2. Depression.  3. Hypothyroidism.  4. Bilateral greater trochanter bursitis.  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  6. Plantar fasciitis.   PLAN:  1. After informed consent, we injected both greater trochanters with      40 mg of Kenalog and 3 mL of 1% lidocaine.  The patient tolerated      it well.  2. I gave the patient a sample of Lidoderm patches to place over her      neck region.  I think most of this is positional at this point and      should improve with stretching and  range of motion and improved      posture.  3. Continue Darvocet or Vicodin for breakthrough pain.  4. I will see her back at her prior scheduled appointment in 3-4      months.      Ranelle Oyster, M.D.  Electronically Signed     ZTS/MedQ  D:  09/11/2007 12:30:25  T:  09/11/2007 12:42:46  Job #:  [id number]

## 2010-09-13 NOTE — H&P (Signed)
[name], [name]              ACCOUNT NO.:  [id number]   MEDICAL RECORD NO.:  [id number]          PATIENT TYPE:  INP   LOCATION:  2032                         FACILITY:  [facility]   PHYSICIAN:  Corky Crafts, MDDATE OF BIRTH:  [birth year]   DATE OF ADMISSION:  07/26/2007  DATE OF DISCHARGE:                              HISTORY & PHYSICAL   CHIEF COMPLAINT:  Shortness of breath.   Allison [name] is a pleasant 60 year old female patient of Dr. Fredric [name] with a 4- to 5-week history of atrial fibrillation felt to be  secondary to hyperthyroidism.  She was initially hospitalized for this  on June 21, 2007.  She was placed on beta blocker therapy at that  time as well as Cardizem.  Rate improved and she was later discharged.  Due to her young age and low CHADS score, she was not placed on  Coumadin.   The patient underwent radioactive iodine treatment on July 19, 2007,  and has done fairly well.  However, heart rate has continued to be  elevated and the last week she has had increasing dyspnea and inability  to lie flat due to her breathing.  She has also had some increasing  fatigue.  She is being admitted for atrial fibrillation with rapid  ventricular rate with increased dyspnea.   REVIEW OF SYSTEMS:  As above.  Denies any fever or chills.  HEENT:  Congenital deafness.  Does read lips.  Has had some mouth soreness on  the right side of her tongue.  Is currently on a mouthwash for this.  No  sore throat.  NECK:  Feels like she is choking at times.  Thyroid gland  is enlarged.  Thyroid nontender.  CHEST:  Has had some palpitations.  Has had no chest pain or pressure.  LUNGS:  As above.  She is unable to  speak due to difficulty of breathing.  She has PND and orthopnea.  No  cough or wheezing.  No recent URI.  ABDOMEN:  Denies pain, nausea,  vomiting, diarrhea, constipation, melena or hematochezia.  GU:  Recent  UTI, placed on Keflex, symptoms improving.  EXTREMITIES:   Denies  significant edema.  No numbness, tingling.  No headaches, seizures,  memory loss.  Occasional lightheadedness but no presyncope or frank  syncope.   PAST MEDICAL HISTORY:  1. Atrial fibrillation with RVR 4-5 weeks.  2. Hyperthyroidism, received radioactive iodine treatment on July 19, 2007.  3. Congenital deafness.  4. Anxiety/depression.  5. CHF.  6. GERD.  7. IBS.   PAST SURGICAL HISTORY:  1. Partial thyroidectomy, 1991.  2. Bilateral cataract removal.   SOCIAL HISTORY:  Nonsmoker, does not drink.  She is married.  No illicit  drug use.  Mother involved in her care.   FAMILY HISTORY:  Father had a history of irregular heartbeat.   CURRENT MEDICATIONS:  1. Nexium 40 mg a day.  2. Climara patch 0.075 mg weekly.  3. Cymbalta 60 mg daily.  4. Xanax 0.25 mg b.i.d. p.r.n.  5. Calcium with D b.i.d.  6. Singulair 10  mg a day.  7. Atenolol 50 mg one in the morning and two in the evening.  8. __________  question dose, one a day.  9. Levsin 0.125 mg t.i.d. p.r.n.  10.Lasix 40 mg b.i.d.  11.Potassium chloride 20 mEq daily.  12.Aspirin 325 mg daily.  13.Zantac 150 mg daily.  14.Cartia XT 120 mg daily.  15.Ambien 5 mg at bedtime p.r.n. sleep.  16.Cephalexin 500 mg b.i.d. x10 days.  17.Cyclobenzaprine 5 mg p.r.n.  18.Nystatin mouthwash t.i.d.   DRUG ALLERGIES:  Numerous:  1. SULFA.  2. LEVAQUIN.  3. BIAXIN.  4. MACROBID.  5. TOPAMAX.  6. DOXYCYCLINE.  7. NSAIDS.  8. EFFEXOR.  9. TRAZODONE.  10.LEXAPRO.  11.MIRTAZAPINE.   OBJECTIVE DATA:  Weight is 182.4 up from 172.8 on July 16, 2007.  Pulse  is 108 and irregular.  Blood pressure is 100/80.  GENERAL:  Very pleasant, no acute distress.  She is able to communicate  well with lip reading.  SKIN:  Warm and dry.  HEENT:  Ears, nose and throat unremarkable.  NECK:  Small goiter present.  It is nontender.  No carotid bruits are  auscultated.  There is JVD when the patient lies flat.  CHEST:  Rapid S1, S2.   Ventricular rate 120 beats per minute.  I do not  hear a murmur or rub.  LUNGS:  Tachypneic, respiratory rate at 28 breaths per minute.  She has  decreased breath sounds in the right mid to lower lung area.  ABDOMEN:  Nontender.  GU/RECTAL:  Deferred.  EXTREMITIES:  Moves all extremities x4.  Strength equal bilaterally.  Distal pulses +2.  NEURO:  Strength equal bilaterally.  Neurologic nonfocal.  INTEGUMENT:  Faint macular rash on the lower arms and legs, suspect drug  rash, question Keflex.   IMPRESSION:  1. Atrial fibrillation with rapid ventricular rate.  2. Congestive heart failure as a result of #1.  3. Increasing dyspnea and weakness.  4. Hyperthyroidism, status post radioactive iodine treatment.  5. History of anxiety/depression.  6. Current urinary tract infection.   PLAN:  Will admit to telemetry.  Obtain isoenzymes, labs including TSH.  Start on Cardizem drip after IV bolus.  Monitor pulmonary status.  May  increase Lasix dose based on BNP, chest x-ray.  We will obtain a UA with  admission orders due to a previous history of UTI.  I think her rash is  most likely due to the Keflex.  Dr. Eldridge Dace to follow.      Tamera C. Melvyn Neth, N.P.      Corky Crafts, MD  Electronically Signed    TCL/MEDQ  D:  07/26/2007  T:  07/27/2007  Job:  [id number]

## 2010-09-13 NOTE — Assessment & Plan Note (Signed)
Allison [name] is back regarding her fibromyalgia and chronic hip pain.  She has  had some more pain in the greater trochanteric regions over the last few  weeks.  She has been active in the yard again and this seems to  exacerbate symptoms.  She does try to ice and rest the legs and stretch  but pain has been slowly increasing again.  She has some pain in her  feet as well, usually associated with more walking and exercise.  Neck  is generally stable.  Mood has been good.  She uses Vicodin for  breakthrough pain primarily.  The patient rates her pain at 5/10 today  and describes it as aching.  Pain interferes with general activity,  relations with others, enjoyment of life on a moderate level.   REVIEW OF SYSTEMS:  Notable for spasms in the back and legs.  Otherwise  pertinent positives are as above.  Full reviews is in the written health  and history section in the chart.   SOCIAL HISTORY:  The patient is married and living with her spouse.   PHYSICAL EXAMINATION:  VITAL SIGNS:  Blood pressure is 113/43, pulse is  56, respiratory rate 18, and she is sating 95% on room air.  GENERAL:  The patient is pleasant, alert, and oriented x3.  Affect is  bright.  She continues to have pain over both greater trochanters, left  greater than right.  She had good strength at 5/5 in both legs.  Normal  sensation.  She had no antalgia with gait today.  She has some pain over  the lower cervical spine but no obvious spasm was appreciated.  She has  some mild head forward position but otherwise posture is good.  HEART:  Regular.  CHEST:  Clear.  ABDOMEN:  Soft and nontender.  She remains hard of hearing.   ASSESSMENT:  1. Fibromyalgia syndrome.  2. Depression.  3. Hypothyroidism.  4. Bilateral greater trochanteric bursitis.  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  6. Plantar fasciitis bilaterally.   PLAN:  1. After informed consent we injected both greater trochanters today      with 4 mg  of Kenalog and 3 mL of 1% lidocaine.  The patient      tolerated well.  2. Continue Vicodin for breakthrough pain.  3. Advised her to continue stretching but to stay active as tolerated.  4. I will see her back in about 3 months' time.      Ranelle Oyster, M.D.  Electronically Signed     ZTS/MedQ  D:  01/27/2008 13:21:57  T:  01/28/2008 04:50:55  Job #:  [id number]

## 2010-09-13 NOTE — Discharge Summary (Signed)
Allison [name], Allison [name]              ACCOUNT NO.:  [id number]   MEDICAL RECORD NO.:  [id number]          PATIENT TYPE:  INP   LOCATION:  3710                         FACILITY:  [facility]   PHYSICIAN:  [name], MD      DATE OF BIRTH:  [birth year]   DATE OF ADMISSION:  06/21/2007  DATE OF DISCHARGE:  06/25/2007                               DISCHARGE SUMMARY   MEDICATIONS ON DISCHARGE:  1. Nexium 40 mg daily.  2. Climara 0.075-mg patch once a week.  3. Cymbalta 60 mg daily.  4. Xanax 0.25 b.i.d. p.r.n.  5. Calcium with vitamin D 2 a day.  6. Aspirin 325 mg daily.  7. Singulair 10 mg daily.  8. Atenolol 50 mg b.i.d.  9. Tapazole 5 mg t.i.d.  10.Darvocet-N 100 p.r.n. for pain.  11.Bethanechol 25 mg daily p.r.n.  12.Lorazepam 2 mg p.o. daily p.r.n.  13.Nystatin mouthwash 5-10 mL swish and spit 4 times a day as needed.   No decongestants or cold medications.  Follow with cardiology and follow  with me in 1 week.   DISCHARGE DIAGNOSES:  1. Atrial fibrillation with rapid ventricular response.  2. Mild congestive heart failure secondary to atrial fibrillation.  3. Hyperthyroidism.  4. Gastroesophageal reflux disease.  5. Hearing loss, congenital.  6. Hypertension.  7. Depression.  8. Fibromyalgia.   CONSULTATION:  Cardiology.   As far as the lab data, normal renal function.  White count 9.6.  The  electrolytes remained negative.  TSH 0.007, free T4 3.88, and T3 is 447,  antibodies positive.  As far as her EKG, showed a AFib  with rapid  ventricular response.  The echocardiogram of the heart, normal LV  function, mildly decreased EF of 45%, moderate mitral regurgitation.  As  far as the BNP, was elevated initially.   HOSPITAL COURSE:  1. AFib with rapid ventricular response and CHF.  The patient was      initially treated with IV Cardizem and switched to beta-blockers      and thought to be secondary to hyperthyroidism, medically managed      with aspirin and did not require  any Coumadin.  Cardiology was      following.  An echocardiogram showed mild decreased LV function      would consider mild CHF.  The patient was started on some      diuresing, which improved her symptoms.  It was also thought to be      related to hyperthyroidism.  2. Hyperthyroidism with possibly Graves disease with positive      antibodies.  The patient had previous thyroidectomies, started her      on Tapazole.  We will give radioactive iodine treatment as      outpatient and her heart rate remained in the 90-100 range with the      beta-blocker and Tapazole.  3. Depression.  Continued on Cymbalta.  4. Gastroesophageal reflux disease.  Continued on Nexium.  5. The hypertension remain stable.   The patient will be followed up outpatient with cardiology as well as  followup endocrinology outpatient,  with me  in the 1 week.      [name], MD  Electronically Signed     KH/MEDQ  D:  07/23/2007  T:  07/24/2007  Job:  [id number]

## 2010-09-13 NOTE — Discharge Summary (Signed)
[name], [name]              ACCOUNT NO.:  [id number]   MEDICAL RECORD NO.:  [id number]          PATIENT TYPE:  INP   LOCATION:  2032                         FACILITY:  [facility]   PHYSICIAN:  Guy Franco, P.A.       DATE OF BIRTH:  1950-10-01   DATE OF ADMISSION:  07/26/2007  DATE OF DISCHARGE:  08/04/2007                               DISCHARGE SUMMARY   DISCHARGE DIAGNOSES:  1. Atrial fibrillation, rate controlled.  2. Transudative effusion, status post thoracentesis.  3. Volume overload, clinically improved.  4. Hyperthyroidism, status post radioactive iodine treatment March      2009.  5. Depression.   HOSPITAL COURSE:  Allison [name] is a 60 year old female with a 4-5 week  history of atrial fibrillation felt to be secondary to her  hyperthyroidism.  She was initially hospitalized for this on June 21, 2007.  She was placed on beta-blocker therapy at that time as well  as Cardizem. The rate improved and she was later discharged to home from  that hospital stay.  Due to her young age and low chance for it, she was  not placed on Coumadin nor will be placed on Coumadin during this  admission.  On July 19, 2007, she underwent radioactive iodine  treatment for her hyperthyroidism and unfortunately her heart rates  continued to elevate over the past week.  She has had some shortness of  breath and inability to lie flat due to her breathing.  She was admitted  for atrial fibrillation with RVR and with increased shortness of breath.   She was treated in the hospital with IV Cardizem and Tenormin.  Ultimately, we did get her heart rate under control.  She was felt to be  at more risk and she had a low chance for it, therefore, she was not  placed on any Coumadin but she was placed on aspirin therapy.   Because of her shortness of breath, a chest x-ray was performed and this  showed a congestive heart failure with bilateral pleural and parenchymal  densities noted.  Followup  chest x-rays confirmed right pleural  effusion.  Pulmonary and critical care were called in to help [place] with  this, and she underwent a thoracentesis and this was transudative.  By  August 04, 2007, she was felt to be ready for discharge to home.   DISCHARGE LABS:  Include pleural fluid with no yeast or fungal elements  noted.  Pleural fluid grew no organisms.  TSH less than 0.004 with a  free T4 of 1.25 and T3 of 3.1.  AFB showed no acid fast bacilli.  Sodium  135, potassium 4.5, BUN 10, creatinine 0.89, BNP 515.  Pleural fluid  showed cholesterol of 31, pH 7.61, triglycerides 20, protein 6.5, LDH  181.  Pleural fluid shows a hazy appearance with white blood count per  field of 1075 with neutrophils 27, lymphocytes 48, macrophage in serous  fluid was 25.   DISCHARGE MEDICATIONS:  1. Potassium 20 mEq a day.  2. Lasix 40 mg a day.  3. Enteric-coated aspirin 325 mg a day.  4. Zantac 150 mg a day.  5. Singulair 10 mg a day.  6. Climara patches as prior to admission.  7. Nexium 40 mg a day.  8. Cymbalta 60 mg.  9. Atenolol 100 mg twice a day.  10  Cartia XT 180 mg a day.  1. Xanax p.r.n.  2. Calcium with vitamin D daily.  3. Buffinol as before.  4. Levsin as needed.  5. Nystatin mouthwash as needed, as prior to admission.   The patient is to remain on low-sodium, heart-healthy diet.  Follow up  with Eldridge Dace on August 26, 2007, at 3:30 p.m.      Guy Franco, P.A.     LB/MEDQ  D:  09/04/2007  T:  09/05/2007  Job:  [id number]

## 2010-09-13 NOTE — Assessment & Plan Note (Signed)
Allison [name] is back regarding her fibromyalgia and hip pain.  From the  standpoint of her pain, she has been doing fairly well.  The pain today  is a 2/10.  She has had some problems with her thyroid and secondarily  her heart.  She was in the hospital for increased heart rate and heart  failure.  She is apparently going to need chemical therapy for her  enlarged thyroid with eventual thyroid replacement after treatment is  complete.  In general, she admits to feeling a bit better, but she is  still short of breath with minimal exertion.  Hips are stable.  She does  not want further injections today.   REVIEW OF SYSTEMS:  Notable for constipation, vomiting, coughing,  shortness of breath.  Full review of systems as noted above.   SOCIAL HISTORY:  Unchanged.   PHYSICAL EXAMINATION:  Blood pressure is 113/62.  Pulse is 85 and  irregular.  Respiratory rate 18.  She is saturating 98% on room air.  Patient is pleasant, alert and oriented x3.  Affect is bright and  appropriate.  Gait is stable.  No loss of balance noted today.  She has some tender  points in the hips, shoulders, back, legs, etc.  Minimal swelling is  noted in the legs.  Reflexes are 2+.  Sensation is intact.   ASSESSMENT:  1. Fibromyalgia syndrome.  2. Depression.  3. History of hypothyroidism due to enlarged thyroid with associated      cardiac complications.  4. Bilateral greater trochanter bursitis.  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  6. History of plantar fasciitis.   PLAN:  1. Continue exercise up to tolerance in the house.  2. Endocrinology plan per primary team.  3. Hold off on any further injections today.  4. Continue Darvocet or Vicodin for breakthrough pain.  5. Continue Celebrex for now as long as this is okay with her      cardiology team.  6. I will see her back in 58-month followup.  From my standpoint, she      is doing fairly well.      Ranelle Oyster, M.D.  Electronically  Signed     ZTS/MedQ  D:  07/09/2007 11:50:11  T:  07/09/2007 18:27:02  Job #:  [id number]

## 2010-09-16 NOTE — Op Note (Signed)
[place]. [facility]  Patient:    Allison [name], Allison [name]                       MRN: [id number] Proc. Date: 05/14/00 Attending:  Larina Earthly, M.D.                           Operative Report  PREOPERATIVE DIAGNOSIS:  Varicose veins, right leg.  POSTOPERATIVE DIAGNOSIS:  Varicose veins, right leg.  PROCEDURE:  Ligation/stripping of greater saphenous vein from mid thigh to mid calf, removal of tributary varicosities with stab evulsion technique.  SURGEON:  Larina Earthly, M.D.  ASSISTANT:  Myrlene Broker, P.A.-C.  ANESTHESIA:  LMA.  COMPLICATIONS:  None.  DISPOSITION:  Recovery room, stable.  PROCEDURE IN DETAIL:  The patient was taken to the operating room, placed in supine position, where the area of the right groin and right leg were prepped and draped in usual sterile fashion.  Prior to anesthesia, the patient had had her legs marked while standing and although tributary varicosities were isolated.  Using stab evulsion technique, the tributary branches of the greater saphenous vein were removed with stab evulsion.  There was removal of her saphenous vein from the mid thigh to the mid calf, which was the only segment involved with the varicosities.  This was removed with the small head of the vein stripper.  The saphenous vein was ligated proximal and distal to this level.  Hemostasis was obtained with pressure otherwise.  The wounds were irrigated with saline and all wounds were all closed with 4-0 subcuticular Vicryl stitches.  Benzoin and Steri-Strips were applied.  A Kerlix and Coban pressure dressing was applied.  The patient tolerated without immediate complication and was transferred to the recovery room in stable condition. DD:  05/14/00 TD:  05/14/00 Job: [id number]

## 2010-09-27 ENCOUNTER — Encounter: Payer: Medicare Other | Attending: Physical Medicine & Rehabilitation | Admitting: Physical Medicine & Rehabilitation

## 2010-09-27 DIAGNOSIS — S139XXA Sprain of joints and ligaments of unspecified parts of neck, initial encounter: Secondary | ICD-10-CM | POA: Insufficient documentation

## 2010-09-27 DIAGNOSIS — X58XXXA Exposure to other specified factors, initial encounter: Secondary | ICD-10-CM | POA: Insufficient documentation

## 2010-09-27 DIAGNOSIS — M76899 Other specified enthesopathies of unspecified lower limb, excluding foot: Secondary | ICD-10-CM | POA: Insufficient documentation

## 2010-09-27 DIAGNOSIS — M47812 Spondylosis without myelopathy or radiculopathy, cervical region: Secondary | ICD-10-CM

## 2010-09-27 DIAGNOSIS — IMO0001 Reserved for inherently not codable concepts without codable children: Secondary | ICD-10-CM

## 2010-09-27 DIAGNOSIS — M19019 Primary osteoarthritis, unspecified shoulder: Secondary | ICD-10-CM

## 2010-09-27 DIAGNOSIS — G609 Hereditary and idiopathic neuropathy, unspecified: Secondary | ICD-10-CM

## 2010-09-27 DIAGNOSIS — F329 Major depressive disorder, single episode, unspecified: Secondary | ICD-10-CM | POA: Insufficient documentation

## 2010-09-27 DIAGNOSIS — M129 Arthropathy, unspecified: Secondary | ICD-10-CM | POA: Insufficient documentation

## 2010-09-27 DIAGNOSIS — F3289 Other specified depressive episodes: Secondary | ICD-10-CM | POA: Insufficient documentation

## 2010-09-27 DIAGNOSIS — R209 Unspecified disturbances of skin sensation: Secondary | ICD-10-CM | POA: Insufficient documentation

## 2010-09-27 DIAGNOSIS — M545 Low back pain, unspecified: Secondary | ICD-10-CM | POA: Insufficient documentation

## 2010-09-28 NOTE — Assessment & Plan Note (Signed)
Allison [name] is back regarding her multiple pain issues.  She ended up having left shoulder surgery several days ago.  Her MRI of her shoulder revealed significant tendinopathy and tendinosis as well as degenerative changes.  Dr. Lajoyce Corners, performed the surgery.  Her neck is still giving her problems at times as well particularly over the lower aspect of the cervical spine.  She has burning and stinging in her feet which is not new and something that we have treated before.  Orthopedics recommend Neurology consult.  REVIEW OF SYSTEMS:  Notable for the above.  Full 12-point review is in the written health and history section of the chart.  SOCIAL HISTORY:  Unchanged.  PHYSICAL EXAMINATION:  VITAL SIGNS:  Blood pressure is 133/60, pulse is 63, respiratory rate 18, she is satting 94% on room air. GENERAL:  The patient is pleasant, alert, and oriented x3.  Remains hard- of-hearing. MUSCULOSKELETAL:  She has pain with palpation over the C6-C7 interspace and associated paraspinals.  Pain seemed to be worse with extension and flexion.  Left shoulder is notably bruised with operative site noted which is intact.  She is limited with rotation and abduction of left shoulder today still.  The remainder of her exam is essentially unremarkable.  She has decreased pinprick and light touch distally in both feet which is not new for her and often inconsistent, but frankly.  ASSESSMENT: 1. Fibromyalgia syndrome. 2. Depression. 3. Cervical whiplash injury with underlying facet     arthropathy/spondylosis. 4. History of bilateral greater trochanter bursitis and low back pain. 5. Bilateral degenerative shoulder disease.  PLAN: 1. Injected the C6-C7 trigger point today, 2 mL of 1% lidocaine.  The     patient has experienced some relief before she left.  I recommended     neck flexion exercises and improved posture and range of motion.  I     think ideally therapy would be helpful for her neck problem.  She   does have enough on her playing with the left shoulder at this     point, so I backed off that request. 2. Continue with her medications for pain as we prescribed. 3. The patient will see Neurology, regarding her suspected neuropathy     and peripheral neuropathy. 4. I will see her back here in about 3-4 months' time.  She will call     me with any problems or questions.     Ranelle Oyster, M.D. Electronically Signed    ZTS/MedQ D:  09/27/2010 12:36:11  T:  09/28/2010 01:25:14  Job #:  [id number]

## 2011-01-17 ENCOUNTER — Encounter: Payer: Medicare Other | Attending: Physical Medicine & Rehabilitation | Admitting: Physical Medicine & Rehabilitation

## 2011-01-17 DIAGNOSIS — M47812 Spondylosis without myelopathy or radiculopathy, cervical region: Secondary | ICD-10-CM

## 2011-01-17 DIAGNOSIS — M76899 Other specified enthesopathies of unspecified lower limb, excluding foot: Secondary | ICD-10-CM

## 2011-01-17 DIAGNOSIS — F329 Major depressive disorder, single episode, unspecified: Secondary | ICD-10-CM | POA: Insufficient documentation

## 2011-01-17 DIAGNOSIS — F3289 Other specified depressive episodes: Secondary | ICD-10-CM | POA: Insufficient documentation

## 2011-01-17 DIAGNOSIS — S139XXA Sprain of joints and ligaments of unspecified parts of neck, initial encounter: Secondary | ICD-10-CM | POA: Insufficient documentation

## 2011-01-17 DIAGNOSIS — IMO0001 Reserved for inherently not codable concepts without codable children: Secondary | ICD-10-CM | POA: Insufficient documentation

## 2011-01-17 DIAGNOSIS — M7530 Calcific tendinitis of unspecified shoulder: Secondary | ICD-10-CM

## 2011-01-17 DIAGNOSIS — X58XXXA Exposure to other specified factors, initial encounter: Secondary | ICD-10-CM | POA: Insufficient documentation

## 2011-01-17 NOTE — Assessment & Plan Note (Signed)
HISTORY:  Allison [name] is back regarding her multiple issues.  She had minimal benefits with the trigger point injection, we performed in May.  I have not heard from her since.  Apparently she saw Dr. Aldean Baker and is doing an extensive neck workup.  The patient tells me she had some injections in her neck as well as EMG nerve conduction study too.  I am really not to clear on the complicated events of the last few months as the patient was not clear when she recounted them to me today.  The patient is getting oxycodone from Dr. Lajoyce Corners.  REVIEW OF SYSTEMS:  Notable for the above.  Full 12-point review is in the written health and history section of the chart.  SOCIAL HISTORY:  Unchanged.  PHYSICAL EXAMINATION:  VITAL SIGNS:  Blood pressure 135/55, pulse 60, respiratory rate 16, and she is satting 95% on room air. GENERAL:  The patient is pleasant, alert.  Right shoulder is notably painful with apprehension maneuver as well as rotator cuff maneuvers today.  She has good strength and sensation in both sides.  She is a bit limited still with flexion, extension as well as rotation of the neck. Cognitively, she is at her baseline.  ASSESSMENT: 1. Fibromyalgia syndrome. 2. Depression. 3. Cervical whiplash injury questionable facet/spondylosis underlying.  PLAN: 1. The patient is going through extensive workup to Dr. Audrie Lia office.     I understand he is prescribing her some oxycodone.  If this     continues, I will not be able to prescribe her hydrocodone any     longer.  I let her followup with him regarding further treatment     and plan.  I will stay out of the neck picture at this point. 2. We will see her back here in about 4 months with no further changes     made today from our end.  I do feel that her shoulder and neck     problem is potentially more shoulder related involving the labrum     and/or rotator cuff.     Ranelle Oyster, M.D. Electronically  Signed    ZTS/MedQ D:  01/17/2011 13:35:33  T:  01/17/2011 22:15:00  Job #:  [id number]

## 2011-01-23 LAB — CBC
HCT: 33.4 — ABNORMAL LOW
HCT: 35 — ABNORMAL LOW
HCT: 36.9
HCT: 38.6
Hemoglobin: 11.1 — ABNORMAL LOW
Hemoglobin: 11.6 — ABNORMAL LOW
Hemoglobin: 12.6
Hemoglobin: 13
MCHC: 32.6
MCHC: 33.1
MCHC: 33.2
MCHC: 32.2
MCV: 83.2
MCV: 83.5
MCV: 83.8
MCV: 84.1
MCV: 81.6
Platelets: 193
Platelets: 200
Platelets: 223
RBC: 4.21
RBC: 4.95
RDW: 12.7
RDW: 12.7
RDW: 12.8
RDW: 12.8
RDW: 13.2
WBC: 12.7 — ABNORMAL HIGH
WBC: 9.6
WBC: 8.1

## 2011-01-23 LAB — BASIC METABOLIC PANEL
BUN: 12
CO2: 24
CO2: 25
CO2: 27
CO2: 27
CO2: 32
Calcium: 8.7
Calcium: 8.7
Calcium: 8.8
Chloride: 101
Chloride: 100
Chloride: 100
Chloride: 99
Creatinine, Ser: 1.13
Creatinine, Ser: 1.34 — ABNORMAL HIGH
Creatinine, Ser: 1.38 — ABNORMAL HIGH
GFR calc Af Amer: >60
GFR calc Af Amer: >60
GFR calc non Af Amer: 58 — ABNORMAL LOW
GFR calc non Af Amer: >60
Glucose, Bld: 101 — ABNORMAL HIGH
Glucose, Bld: 142 — ABNORMAL HIGH
Glucose, Bld: 103 — ABNORMAL HIGH
Glucose, Bld: 93
Glucose, Bld: 96
Glucose, Bld: 99
Potassium: 3.3 — ABNORMAL LOW
Potassium: 4
Potassium: 3.9
Sodium: 132 — ABNORMAL LOW
Sodium: 134 — ABNORMAL LOW
Sodium: 137

## 2011-01-23 LAB — COMPREHENSIVE METABOLIC PANEL
ALT: 13
ALT: 12
AST: 18
Alkaline Phosphatase: 97
Alkaline Phosphatase: 172 — ABNORMAL HIGH
BUN: 6
CO2: 22
CO2: 29
Chloride: 106
Chloride: 100
Creatinine, Ser: 1.18
GFR calc Af Amer: 57 — ABNORMAL LOW
GFR calc non Af Amer: 47 — ABNORMAL LOW
Glucose, Bld: 166 — ABNORMAL HIGH
Potassium: 4.1
Potassium: 4.5
Total Bilirubin: 0.9
Total Bilirubin: 0.8

## 2011-01-23 LAB — CARDIAC PANEL(CRET KIN+CKTOT+MB+TROPI)
CK, MB: 1.1
CK, MB: 1.4
Relative Index: INVALID
Relative Index: INVALID
Relative Index: INVALID
Relative Index: INVALID
Total CK: 20
Total CK: 22
Total CK: 31
Total CK: 29
Total CK: 42
Troponin I: 0.01
Troponin I: 0.02
Troponin I: 0.01

## 2011-01-23 LAB — DIFFERENTIAL
Basophils Absolute: 0
Basophils Relative: 1
Eosinophils Absolute: 0
Neutro Abs: 6.2
Neutrophils Relative %: 66

## 2011-01-23 LAB — CK TOTAL AND CKMB (NOT AT ARMC)
CK, MB: 0.8
Total CK: 24

## 2011-01-23 LAB — HEPARIN LEVEL (UNFRACTIONATED)
Heparin Unfractionated: 0.18 — ABNORMAL LOW
Heparin Unfractionated: 0.32
Heparin Unfractionated: 0.36

## 2011-01-23 LAB — BLOOD GAS, ARTERIAL
Acid-base deficit: 4.2 — ABNORMAL HIGH
TCO2: 20.1
pCO2 arterial: 27.9 — ABNORMAL LOW
pH, Arterial: 7.449 — ABNORMAL HIGH
pO2, Arterial: 62.9 — ABNORMAL LOW

## 2011-01-23 LAB — B-NATRIURETIC PEPTIDE (CONVERTED LAB)
Pro B Natriuretic peptide (BNP): 476 — ABNORMAL HIGH
Pro B Natriuretic peptide (BNP): 962 — ABNORMAL HIGH

## 2011-01-23 LAB — T3: T3, Total: 447.5 — ABNORMAL HIGH (ref 80.0–204.0)

## 2011-01-23 LAB — MAGNESIUM: Magnesium: 1.9

## 2011-01-23 LAB — APTT
aPTT: 41 — ABNORMAL HIGH
aPTT: 42 — ABNORMAL HIGH

## 2011-01-23 LAB — T4, FREE: Free T4: 3.15 — ABNORMAL HIGH

## 2011-01-23 LAB — PROTIME-INR
INR: 1.1
INR: 1.1
Prothrombin Time: 13.9

## 2011-01-24 LAB — BASIC METABOLIC PANEL
BUN: 10
BUN: 10
Calcium: 8.7
Calcium: 8.9
Creatinine, Ser: 0.89
Creatinine, Ser: 0.93
GFR calc Af Amer: >60
GFR calc non Af Amer: >60
GFR calc non Af Amer: >60
Glucose, Bld: 111 — ABNORMAL HIGH
Potassium: 3.7

## 2011-01-24 LAB — FUNGUS CULTURE W SMEAR

## 2011-01-24 LAB — BODY FLUID CELL COUNT WITH DIFFERENTIAL
Lymphs, Fluid: 48
Monocyte-Macrophage-Serous Fluid: 25 — ABNORMAL LOW
Neutrophil Count, Fluid: 27 — ABNORMAL HIGH
Total Nucleated Cell Count, Fluid: 1075 — ABNORMAL HIGH

## 2011-01-24 LAB — PROTEIN, TOTAL: Total Protein: 6.5

## 2011-01-24 LAB — BODY FLUID CULTURE

## 2011-01-24 LAB — LACTATE DEHYDROGENASE: LDH: 181

## 2011-01-24 LAB — GLUCOSE, SEROUS FLUID: Glucose, Fluid: 101

## 2011-01-24 LAB — TRIGLYCERIDES, BODY FLUIDS: Triglycerides, Fluid: 20

## 2011-01-24 LAB — AFB CULTURE WITH SMEAR (NOT AT ARMC)

## 2011-01-24 LAB — PH, BODY FLUID: pH, Fluid: 7.61

## 2011-01-24 LAB — B-NATRIURETIC PEPTIDE (CONVERTED LAB)
Pro B Natriuretic peptide (BNP): 515 — ABNORMAL HIGH
Pro B Natriuretic peptide (BNP): 746 — ABNORMAL HIGH

## 2011-01-24 LAB — T3, FREE: T3, Free: 3.1 (ref 2.3–4.2)

## 2011-01-24 LAB — GLUCOSE, RANDOM: Glucose, Bld: 82

## 2011-01-24 LAB — PROTEIN, BODY FLUID

## 2011-01-24 LAB — AMYLASE, BODY FLUID: Amylase, Fluid: 13

## 2011-02-08 LAB — URINALYSIS, ROUTINE W REFLEX MICROSCOPIC
Bilirubin Urine: NEGATIVE
Nitrite: NEGATIVE
Specific Gravity, Urine: 1.011
Urobilinogen, UA: 0.2
pH: 5.5

## 2011-02-08 LAB — DIFFERENTIAL
Basophils Absolute: 0
Basophils Relative: 0
Lymphocytes Relative: 37
Monocytes Absolute: 0.7
Monocytes Relative: 11
Neutro Abs: 3.4
Neutrophils Relative %: 50

## 2011-02-08 LAB — CBC
HCT: 40.5
Hemoglobin: 13.4
MCV: 84.9
Platelets: 277
RDW: 13

## 2011-02-08 LAB — COMPREHENSIVE METABOLIC PANEL
Albumin: 3.3 — ABNORMAL LOW
Alkaline Phosphatase: 91
BUN: 5 — ABNORMAL LOW
Creatinine, Ser: 0.66
Glucose, Bld: 117 — ABNORMAL HIGH
Total Bilirubin: 0.4
Total Protein: 6.1

## 2011-05-15 ENCOUNTER — Encounter: Payer: Medicare Other | Admitting: Physical Medicine & Rehabilitation

## 2011-06-19 ENCOUNTER — Other Ambulatory Visit: Payer: Self-pay | Admitting: Internal Medicine

## 2011-06-19 DIAGNOSIS — Z1231 Encounter for screening mammogram for malignant neoplasm of breast: Secondary | ICD-10-CM

## 2011-07-12 ENCOUNTER — Ambulatory Visit
Admission: RE | Admit: 2011-07-12 | Discharge: 2011-07-12 | Disposition: A | Discharge status: Home / Self Care | Payer: Medicare Other | Source: Ambulatory Visit | Attending: Internal Medicine | Admitting: Internal Medicine

## 2011-07-12 DIAGNOSIS — Z1231 Encounter for screening mammogram for malignant neoplasm of breast: Secondary | ICD-10-CM

## 2012-04-03 ENCOUNTER — Other Ambulatory Visit: Payer: Self-pay | Admitting: Gastroenterology

## 2012-04-04 ENCOUNTER — Encounter (HOSPITAL_COMMUNITY): Payer: Self-pay | Admitting: Pharmacy Technician

## 2012-04-05 ENCOUNTER — Encounter (HOSPITAL_COMMUNITY): Payer: Self-pay | Admitting: *Deleted

## 2012-04-05 NOTE — Pre-Procedure Instructions (Addendum)
Your procedure is scheduled on: Tuesday, April 09, 2012     Hearing impaired,numerous medication allergies Report to [facility] Admitting at:1200 Call this number if you have problems morning of your procedure:[phone]  Follow all bowel prep instructions per your doctor's orders.  Do not eat or drink anything after midnight the night before your procedure. You may brush your teeth, rinse out your mouth, but no water, no food, no chewing gum, no mints, no candies, no chewing tobacco.     Take these medicines the morning of your procedure with A SIP OF WATER:   Please make arrangements for a responsible person to drive you home after the procedure. You cannot go home by cab/taxi. We recommend you have someone with you at home the first 24 hours after your procedure. Driver for procedure is Bud  LEAVE ALL VALUABLES, JEWELRY, BILLFOLD AT HOME.  NO DENTURES, CONTACT LENSES ALLOWED IN THE ENDOSCOPY ROOM.   YOU MAY WEAR DEODORANT, PLEASE REMOVE ALL JEWELRY, WATCHES RINGS, BODY PIERCINGS AND LEAVE AT HOME.   WOMEN: NO MAKE-UP, LOTIONS PERFUMES

## 2012-04-09 ENCOUNTER — Encounter (HOSPITAL_COMMUNITY): Payer: Self-pay | Admitting: Anesthesiology

## 2012-04-09 ENCOUNTER — Encounter (HOSPITAL_COMMUNITY)
Admission: RE | Disposition: A | Discharge status: Home / Self Care | Payer: Self-pay | Source: Ambulatory Visit | Attending: Gastroenterology

## 2012-04-09 ENCOUNTER — Ambulatory Visit (HOSPITAL_COMMUNITY)
Admission: RE | Admit: 2012-04-09 | Discharge: 2012-04-09 | Disposition: A | Discharge status: Home / Self Care | Payer: Medicare Other | Source: Ambulatory Visit | Attending: Gastroenterology | Admitting: Gastroenterology

## 2012-04-09 ENCOUNTER — Encounter (HOSPITAL_COMMUNITY): Payer: Self-pay | Admitting: *Deleted

## 2012-04-09 ENCOUNTER — Ambulatory Visit (HOSPITAL_COMMUNITY): Payer: Medicare Other | Admitting: Anesthesiology

## 2012-04-09 DIAGNOSIS — N183 Chronic kidney disease, stage 3 unspecified: Secondary | ICD-10-CM | POA: Insufficient documentation

## 2012-04-09 DIAGNOSIS — IMO0001 Reserved for inherently not codable concepts without codable children: Secondary | ICD-10-CM | POA: Insufficient documentation

## 2012-04-09 DIAGNOSIS — E890 Postprocedural hypothyroidism: Secondary | ICD-10-CM | POA: Insufficient documentation

## 2012-04-09 DIAGNOSIS — Z860100 Personal history of colon polyps, unspecified: Secondary | ICD-10-CM | POA: Insufficient documentation

## 2012-04-09 DIAGNOSIS — K921 Melena: Secondary | ICD-10-CM | POA: Insufficient documentation

## 2012-04-09 DIAGNOSIS — Z79899 Other long term (current) drug therapy: Secondary | ICD-10-CM | POA: Insufficient documentation

## 2012-04-09 DIAGNOSIS — K219 Gastro-esophageal reflux disease without esophagitis: Secondary | ICD-10-CM | POA: Insufficient documentation

## 2012-04-09 HISTORY — PX: COLONOSCOPY WITH PROPOFOL: SHX5780

## 2012-04-09 HISTORY — DX: Essential (primary) hypertension: I10

## 2012-04-09 HISTORY — DX: Gastro-esophageal reflux disease without esophagitis: K21.9

## 2012-04-09 HISTORY — DX: Other shoulder lesions, unspecified shoulder: M75.80

## 2012-04-09 HISTORY — DX: Unspecified osteoarthritis, unspecified site: M19.90

## 2012-04-09 SURGERY — COLONOSCOPY WITH PROPOFOL
Anesthesia: Monitor Anesthesia Care

## 2012-04-09 MED ORDER — LACTATED RINGERS IV SOLN
INTRAVENOUS | Status: DC | PRN
  Administered 2012-04-09: 12:00:00 via INTRAVENOUS

## 2012-04-09 MED ORDER — KETAMINE HCL 10 MG/ML IJ SOLN
INTRAMUSCULAR | Status: DC | PRN
  Administered 2012-04-09: 30 mg via INTRAVENOUS

## 2012-04-09 MED ORDER — SODIUM CHLORIDE 0.9 % IV SOLN: INTRAVENOUS | Status: DC

## 2012-04-09 MED ORDER — ONDANSETRON HCL 4 MG/2ML IJ SOLN
INTRAMUSCULAR | Status: DC | PRN
  Administered 2012-04-09: 4 mg via INTRAVENOUS

## 2012-04-09 MED ORDER — PROPOFOL 10 MG/ML IV EMUL
INTRAVENOUS | Status: DC | PRN
  Administered 2012-04-09: 100 ug/kg/min via INTRAVENOUS

## 2012-04-09 MED ORDER — MIDAZOLAM HCL 5 MG/5ML IJ SOLN
INTRAMUSCULAR | Status: DC | PRN
  Administered 2012-04-09: 2 mg via INTRAVENOUS

## 2012-04-09 SURGICAL SUPPLY — 22 items

## 2012-04-09 NOTE — Preoperative (Signed)
Beta Blockers   Reason not to administer Beta Blockers:Not Applicable 

## 2012-04-09 NOTE — Anesthesia Preprocedure Evaluation (Addendum)
Anesthesia Evaluation  Patient identified by MRN, date of birth, ID band Patient awake    Reviewed: Allergy & Precautions, H&P , NPO status , Patient's Chart, lab work & pertinent test results, reviewed documented beta blocker date and time   Airway Mallampati: II TM Distance: >3 FB Neck ROM: full    Dental No notable dental hx.    Pulmonary asthma ,  breath sounds clear to auscultation  Pulmonary exam normal       Cardiovascular Exercise Tolerance: Good hypertension, Pt. on home beta blockers Rhythm:regular Rate:Normal     Neuro/Psych negative neurological ROS  negative psych ROS   GI/Hepatic negative GI ROS, Neg liver ROS, GERD-  Medicated and Controlled,  Endo/Other  negative endocrine ROS  Renal/GU negative Renal ROS  negative genitourinary   Musculoskeletal   Abdominal   Peds  Hematology negative hematology ROS (+)   Anesthesia Other Findings   Reproductive/Obstetrics negative OB ROS                           Anesthesia Physical Anesthesia Plan  ASA: II  Anesthesia Plan: MAC   Post-op Pain Management:    Induction:   Airway Management Planned:   Additional Equipment:   Intra-op Plan:   Post-operative Plan:   Informed Consent: I have reviewed the patients History and Physical, chart, labs and discussed the procedure including the risks, benefits and alternatives for the proposed anesthesia with the patient or authorized representative who has indicated his/her understanding and acceptance.   Dental Advisory Given  Plan Discussed with: CRNA and Surgeon  Anesthesia Plan Comments:         Anesthesia Quick Evaluation

## 2012-04-09 NOTE — Op Note (Signed)
Procedure: Diagnostic colonoscopy following a bout of painless hematochezia.  Endoscopist:Martin Johnson  Premedication: Propofol administered by anesthesia  Procedure: The patient was placed in the left lateral decubitus position. Anal inspection and digital rectal exam were normal. The Pentax pediatric colonoscope was introduced into the rectum and advanced to the cecum. A normal-appearing ileocecal valve and appendiceal orifice were identified. Colonic preparation for the exam today was good.  Rectum. Normal. Retroflex view of the distal rectum normal.  Sigmoid colon and descending colon area normal.  Splenic flexure. Normal.  Transverse colon. Normal.  Hepatic flexure. Normal.  Ascending colon. Normal.  Cecum and ileocecal valve. Normal.  Assessment: Normal diagnostic proctocolonoscopy to the cecum.  Recommendations: Schedule repeat surveillance colonoscopy in 5 years

## 2012-04-09 NOTE — H&P (Signed)
  Problem: Resolved hematochezia.  History: The patient is a 61 year old female born 10/14/1950. The patient has a past history of colon polyps and underwent a normal surveillance colonoscopy in 2009. Last month, the patient had an episode of painless hematochezia which has not recurred.  The patient is scheduled to undergo a diagnostic colonoscopy.  Medication allergies: Sulfa drugs. Levaquin. Biaxin. Macrobid. Doxycycline. Nonsteroidal anti-inflammatory medication.  Chronic medications: Lorazepam. Cyclobenzaprine. Hydrocodone. Acetaminophen. Ambien. Synthroid. Calcium. Vitamin D. Climara patch. Flexeril. Fluticasone. Hyoscyamine. Atenolol. Nexium. Singulair. Promethazine. Fexofenodine. Diclofenac. Lyrica.  Past medical history: Hypothyroidism post ablation for hyperthyroidism. Atrial fibrillation. Heart failure. Chronic abdominal pain. Gastroesophageal reflux. Fibromyalgia syndrome. Anxiety. Osteoarthritis. Insomnia. Irritable bowel syndrome. Depression. Rhinitis. Congenital hearing loss. Colon polyps removed colonoscopically in the past. Peripheral neuropathy. Stage III chronic kidney disease.  Past surgical history: Thyroidectomy. Bilateral cataract surgery. Tonsillectomy. Hysterectomy. Tubal ligation.  Habits: The patient has never smoked cigarettes. She does not consume alcohol.  Exam: The patient is alert and lying comfortably on the endoscopy stretcher. Sclera are nonicteric. Lungs are clear to auscultation. Cardiac exam reveals a regular rhythm. Abdomen is soft, flat, and nontender to palpation in all quadrants.  Plan: Proceed with diagnostic colonoscopy to evaluate hematochezia.

## 2012-04-09 NOTE — Anesthesia Postprocedure Evaluation (Signed)
  Anesthesia Post-op Note  Patient: Allison [name]  Procedure(s) Performed: Procedure(s) (LRB): COLONOSCOPY WITH PROPOFOL (N/A)  Patient Location: PACU  Anesthesia Type: MAC  Level of Consciousness: awake and alert   Airway and Oxygen Therapy: Patient Spontanous Breathing  Post-op Pain: mild  Post-op Assessment: Post-op Vital signs reviewed, Patient's Cardiovascular Status Stable, Respiratory Function Stable, Patent Airway and No signs of Nausea or vomiting  Last Vitals:  Filed Vitals:   04/09/12 1336  BP: 133/76  Temp:   Resp: 15    Post-op Vital Signs: stable   Complications: No apparent anesthesia complications

## 2012-04-09 NOTE — Transfer of Care (Signed)
Immediate Anesthesia Transfer of Care Note  Patient: Allison [name]  Procedure(s) Performed: Procedure(s) (LRB) with comments: COLONOSCOPY WITH PROPOFOL (N/A)  Patient Location: PACU  Anesthesia Type:MAC  Level of Consciousness: awake, alert , oriented and patient cooperative  Airway & Oxygen Therapy: Patient Spontanous Breathing and Patient connected to face mask oxygen  Post-op Assessment: Report given to PACU RN, Post -op Vital signs reviewed and stable and Patient moving all extremities X 4  Post vital signs: Reviewed and stable  Complications: No apparent anesthesia complications

## 2012-04-10 ENCOUNTER — Encounter (HOSPITAL_COMMUNITY): Payer: Self-pay | Admitting: Gastroenterology

## 2012-05-17 ENCOUNTER — Other Ambulatory Visit: Payer: Self-pay | Admitting: Orthopedic Surgery

## 2012-05-17 ENCOUNTER — Ambulatory Visit
Admission: RE | Admit: 2012-05-17 | Discharge: 2012-05-17 | Disposition: A | Discharge status: Home / Self Care | Payer: Medicare Other | Source: Ambulatory Visit | Attending: Orthopedic Surgery | Admitting: Orthopedic Surgery

## 2012-05-17 DIAGNOSIS — M25511 Pain in right shoulder: Secondary | ICD-10-CM

## 2012-05-21 ENCOUNTER — Other Ambulatory Visit: Payer: Self-pay | Admitting: Orthopedic Surgery

## 2012-05-21 NOTE — Progress Notes (Signed)
Patient has preop appointment on 05/29/12 at 1100am for surgery of 06/06/12.  Need orders in EPIC.  Thanks.

## 2012-05-22 ENCOUNTER — Encounter (HOSPITAL_COMMUNITY): Payer: Self-pay | Admitting: Pharmacy Technician

## 2012-05-23 ENCOUNTER — Other Ambulatory Visit: Payer: Self-pay | Admitting: Orthopedic Surgery

## 2012-05-29 ENCOUNTER — Inpatient Hospital Stay (HOSPITAL_COMMUNITY): Admission: RE | Admit: 2012-05-29 | Payer: Medicare Other | Source: Ambulatory Visit

## 2012-06-06 ENCOUNTER — Inpatient Hospital Stay (HOSPITAL_COMMUNITY): Admission: RE | Admit: 2012-06-06 | Payer: Medicare Other | Source: Ambulatory Visit | Admitting: Orthopedic Surgery

## 2012-06-06 ENCOUNTER — Encounter (HOSPITAL_COMMUNITY): Admission: RE | Payer: Self-pay | Source: Ambulatory Visit

## 2012-06-06 SURGERY — ARTHROPLASTY, SHOULDER, TOTAL
Anesthesia: Choice | Site: Shoulder | Laterality: Right

## 2012-06-25 ENCOUNTER — Other Ambulatory Visit: Payer: Self-pay | Admitting: Orthopedic Surgery

## 2012-06-26 ENCOUNTER — Encounter (HOSPITAL_COMMUNITY): Payer: Self-pay | Admitting: Pharmacy Technician

## 2012-06-27 ENCOUNTER — Inpatient Hospital Stay (HOSPITAL_COMMUNITY)
Admission: RE | Admit: 2012-06-27 | Discharge: 2012-06-27 | Disposition: A | Discharge status: Home / Self Care | Payer: Medicare Other | Source: Ambulatory Visit

## 2012-06-27 NOTE — Pre-Procedure Instructions (Signed)
Allison [name]  06/27/2012   Your procedure is scheduled on:  Tuesday, March 4  Report to [facility]  On the 3rd floorat 0530 AM.  Call this number if you have problems the morning of surgery: [phone]   Remember:   Do not eat food or drink liquids after midnight.Monday night   Take these medicines the morning of surgery with A SIP OF WATER: Atenolol,Nexium,Estrace,Gabapentin,Synthroid,Hydrocodone, if needed, Singulair,Methocarbomol, Prednisone   Do not wear jewelry, make-up or nail polish.  Do not wear lotions, powders,  Perfumes or deodorant.  Do not shave 48 hours prior to surgery.   Do not bring valuables to the hospital.  Contacts, dentures or bridgework may not be worn into surgery.  Leave suitcase in the car. After surgery it may be brought to your room.  For patients admitted to the hospital, checkout time is 11:00 AM the day of  discharge.   Special Instructions: Incentive Spirometry - Practice and bring it with you on the day of surgery. Shower using CHG 2 nights before surgery and the night before surgery.  If you shower the day of surgery use CHG.  Use special wash - you have one bottle of CHG for all showers.  You should use approximately 1/3 of the bottle for each shower.   Please read over the following fact sheets that you were given: Pain Booklet, Coughing and Deep Breathing, Blood Transfusion Information and Surgical Site Infection Prevention

## 2012-06-27 NOTE — Progress Notes (Signed)
No show for PAT; patient called and states the insurance has not approved her surgery. Will call her surgeon to verify.PAT to be rescheduled when surgery is approved.

## 2012-07-01 ENCOUNTER — Encounter (HOSPITAL_COMMUNITY): Payer: Self-pay | Admitting: *Deleted

## 2012-07-01 MED ORDER — CEFAZOLIN SODIUM-DEXTROSE 2-3 GM-% IV SOLR
2.0000 g | INTRAVENOUS | Status: AC
  Administered 2012-07-02: 2 g via INTRAVENOUS
  Filled 2012-07-01: qty 50

## 2012-07-01 MED ORDER — POVIDONE-IODINE 7.5 % EX SOLN
Freq: Once | CUTANEOUS | Status: DC
  Filled 2012-07-01: qty 118

## 2012-07-02 ENCOUNTER — Inpatient Hospital Stay (HOSPITAL_COMMUNITY)
Admission: RE | Admit: 2012-07-02 | Discharge: 2012-07-03 | DRG: 484 | Disposition: A | Discharge status: Home / Self Care | Payer: Medicare Other | Source: Ambulatory Visit | Attending: Orthopedic Surgery | Admitting: Orthopedic Surgery

## 2012-07-02 ENCOUNTER — Encounter (HOSPITAL_COMMUNITY)
Admission: RE | Disposition: A | Discharge status: Home / Self Care | Payer: Self-pay | Source: Ambulatory Visit | Attending: Orthopedic Surgery

## 2012-07-02 ENCOUNTER — Encounter (HOSPITAL_COMMUNITY): Payer: Self-pay | Admitting: Anesthesiology

## 2012-07-02 ENCOUNTER — Inpatient Hospital Stay (HOSPITAL_COMMUNITY): Payer: Medicare Other

## 2012-07-02 ENCOUNTER — Inpatient Hospital Stay (HOSPITAL_COMMUNITY): Payer: Medicare Other | Admitting: Anesthesiology

## 2012-07-02 DIAGNOSIS — Z888 Allergy status to other drugs, medicaments and biological substances status: Secondary | ICD-10-CM

## 2012-07-02 DIAGNOSIS — E039 Hypothyroidism, unspecified: Secondary | ICD-10-CM | POA: Diagnosis present

## 2012-07-02 DIAGNOSIS — K219 Gastro-esophageal reflux disease without esophagitis: Secondary | ICD-10-CM | POA: Diagnosis present

## 2012-07-02 DIAGNOSIS — I10 Essential (primary) hypertension: Secondary | ICD-10-CM | POA: Diagnosis present

## 2012-07-02 DIAGNOSIS — Z882 Allergy status to sulfonamides status: Secondary | ICD-10-CM

## 2012-07-02 DIAGNOSIS — M19019 Primary osteoarthritis, unspecified shoulder: Principal | ICD-10-CM | POA: Diagnosis present

## 2012-07-02 DIAGNOSIS — Z96611 Presence of right artificial shoulder joint: Secondary | ICD-10-CM

## 2012-07-02 DIAGNOSIS — Z881 Allergy status to other antibiotic agents status: Secondary | ICD-10-CM

## 2012-07-02 DIAGNOSIS — M19011 Primary osteoarthritis, right shoulder: Secondary | ICD-10-CM

## 2012-07-02 HISTORY — DX: Hypothyroidism, unspecified: E03.9

## 2012-07-02 HISTORY — DX: Fibromyalgia: M79.7

## 2012-07-02 HISTORY — PX: TOTAL SHOULDER ARTHROPLASTY: SHX126

## 2012-07-02 LAB — COMPREHENSIVE METABOLIC PANEL
BUN: 12 mg/dL (ref 6–23)
Calcium: 9.8 mg/dL (ref 8.4–10.5)
Creatinine, Ser: 1.09 mg/dL (ref 0.50–1.10)
GFR calc Af Amer: 61 mL/min — ABNORMAL LOW (ref >90)
Glucose, Bld: 87 mg/dL (ref 70–99)
Total Protein: 6.9 g/dL (ref 6.0–8.3)

## 2012-07-02 LAB — CBC WITH DIFFERENTIAL/PLATELET
Eosinophils Absolute: 0.1 10*3/uL (ref 0.0–0.7)
Eosinophils Relative: 2 % (ref 0–5)
Hemoglobin: 13 g/dL (ref 12.0–15.0)
Lymphs Abs: 2.7 10*3/uL (ref 0.7–4.0)
MCH: 26.7 pg (ref 26.0–34.0)
MCHC: 31.9 g/dL (ref 30.0–36.0)
MCV: 83.8 fL (ref 78.0–100.0)
Monocytes Relative: 8 % (ref 3–12)
RBC: 4.87 MIL/uL (ref 3.87–5.11)

## 2012-07-02 LAB — URINALYSIS, ROUTINE W REFLEX MICROSCOPIC
Bilirubin Urine: NEGATIVE
Hgb urine dipstick: NEGATIVE
Protein, ur: NEGATIVE mg/dL
Specific Gravity, Urine: 1.011 (ref 1.005–1.030)
Urobilinogen, UA: 0.2 mg/dL (ref 0.0–1.0)

## 2012-07-02 LAB — TYPE AND SCREEN
ABO/RH(D): O POS
Antibody Screen: NEGATIVE

## 2012-07-02 LAB — PROTIME-INR
INR: 0.87 (ref 0.00–1.49)
Prothrombin Time: 11.8 seconds (ref 11.6–15.2)

## 2012-07-02 LAB — SURGICAL PCR SCREEN: MRSA, PCR: NEGATIVE

## 2012-07-02 SURGERY — ARTHROPLASTY, SHOULDER, TOTAL
Anesthesia: General | Site: Shoulder | Laterality: Right | Wound class: Clean

## 2012-07-02 MED ORDER — NEOSTIGMINE METHYLSULFATE 1 MG/ML IJ SOLN
INTRAMUSCULAR | Status: DC | PRN
  Administered 2012-07-02: 4 mg via INTRAVENOUS

## 2012-07-02 MED ORDER — DEXAMETHASONE SODIUM PHOSPHATE 10 MG/ML IJ SOLN
INTRAMUSCULAR | Status: DC | PRN
  Administered 2012-07-02: 8 mg via INTRAVENOUS

## 2012-07-02 MED ORDER — THROMBIN 5000 UNITS EX SOLR
CUTANEOUS | Status: AC
  Filled 2012-07-02: qty 5000

## 2012-07-02 MED ORDER — HYDROMORPHONE HCL PF 1 MG/ML IJ SOLN
0.2500 mg | INTRAMUSCULAR | Status: DC | PRN
  Administered 2012-07-02 (×2): 0.5 mg via INTRAVENOUS

## 2012-07-02 MED ORDER — ACETAMINOPHEN 10 MG/ML IV SOLN
INTRAVENOUS | Status: AC
  Administered 2012-07-02: 1000 mg via INTRAVENOUS
  Filled 2012-07-02: qty 100

## 2012-07-02 MED ORDER — ONDANSETRON HCL 4 MG/2ML IJ SOLN
INTRAMUSCULAR | Status: DC | PRN
  Administered 2012-07-02: 4 mg via INTRAVENOUS

## 2012-07-02 MED ORDER — THROMBIN 20000 UNITS EX KIT
PACK | CUTANEOUS | Status: AC
  Filled 2012-07-02: qty 1

## 2012-07-02 MED ORDER — METHOCARBAMOL 750 MG PO TABS
750.0000 mg | ORAL_TABLET | Freq: Three times a day (TID) | ORAL | Status: DC
  Administered 2012-07-02 – 2012-07-03 (×3): 750 mg via ORAL
  Filled 2012-07-02 (×6): qty 1

## 2012-07-02 MED ORDER — ATENOLOL 50 MG PO TABS
50.0000 mg | ORAL_TABLET | Freq: Every day | ORAL | Status: DC
  Administered 2012-07-03: 50 mg via ORAL
  Filled 2012-07-02 (×2): qty 1

## 2012-07-02 MED ORDER — ALUM & MAG HYDROXIDE-SIMETH 200-200-20 MG/5ML PO SUSP: 30.0000 mL | ORAL | Status: DC | PRN

## 2012-07-02 MED ORDER — LORAZEPAM 1 MG PO TABS
2.0000 mg | ORAL_TABLET | Freq: Every evening | ORAL | Status: DC | PRN
  Administered 2012-07-02: 2 mg via ORAL
  Filled 2012-07-02: qty 2

## 2012-07-02 MED ORDER — METOCLOPRAMIDE HCL 5 MG/ML IJ SOLN
5.0000 mg | Freq: Three times a day (TID) | INTRAMUSCULAR | Status: DC | PRN
  Administered 2012-07-02: 10 mg via INTRAVENOUS
  Filled 2012-07-02: qty 2

## 2012-07-02 MED ORDER — MIDAZOLAM HCL 2 MG/2ML IJ SOLN
INTRAMUSCULAR | Status: AC
  Filled 2012-07-02: qty 2

## 2012-07-02 MED ORDER — PROPOFOL 10 MG/ML IV BOLUS
INTRAVENOUS | Status: DC | PRN
  Administered 2012-07-02: 160 mg via INTRAVENOUS
  Administered 2012-07-02: 40 mg via INTRAVENOUS

## 2012-07-02 MED ORDER — ONDANSETRON HCL 4 MG/2ML IJ SOLN
4.0000 mg | Freq: Once | INTRAMUSCULAR | Status: AC | PRN
  Administered 2012-07-02: 4 mg via INTRAVENOUS

## 2012-07-02 MED ORDER — ONDANSETRON HCL 4 MG/2ML IJ SOLN: 4.0000 mg | Freq: Four times a day (QID) | INTRAMUSCULAR | Status: DC | PRN

## 2012-07-02 MED ORDER — OXYCODONE-ACETAMINOPHEN 5-325 MG PO TABS: 1.0000 | ORAL_TABLET | ORAL | Status: DC | PRN

## 2012-07-02 MED ORDER — SODIUM CHLORIDE 0.9 % IR SOLN
Status: DC | PRN
  Administered 2012-07-02: 3000 mL

## 2012-07-02 MED ORDER — MENTHOL 3 MG MT LOZG
1.0000 | LOZENGE | OROMUCOSAL | Status: DC | PRN
  Filled 2012-07-02: qty 9

## 2012-07-02 MED ORDER — BUPIVACAINE-EPINEPHRINE (PF) 0.5% -1:200000 IJ SOLN
INTRAMUSCULAR | Status: AC
  Filled 2012-07-02: qty 10

## 2012-07-02 MED ORDER — ACETAMINOPHEN 650 MG RE SUPP: 650.0000 mg | Freq: Four times a day (QID) | RECTAL | Status: DC | PRN

## 2012-07-02 MED ORDER — FLEET ENEMA 7-19 GM/118ML RE ENEM: 1.0000 | ENEMA | Freq: Once | RECTAL | Status: AC | PRN

## 2012-07-02 MED ORDER — LEVOTHYROXINE SODIUM 112 MCG PO TABS
112.0000 ug | ORAL_TABLET | Freq: Every day | ORAL | Status: DC
  Administered 2012-07-03: 112 ug via ORAL
  Filled 2012-07-02 (×2): qty 1

## 2012-07-02 MED ORDER — MUPIROCIN 2 % EX OINT
TOPICAL_OINTMENT | CUTANEOUS | Status: AC
  Administered 2012-07-02: 1 via NASAL
  Filled 2012-07-02: qty 22

## 2012-07-02 MED ORDER — ROCURONIUM BROMIDE 100 MG/10ML IV SOLN
INTRAVENOUS | Status: DC | PRN
  Administered 2012-07-02: 35 mg via INTRAVENOUS

## 2012-07-02 MED ORDER — BUPIVACAINE-EPINEPHRINE PF 0.5-1:200000 % IJ SOLN
INTRAMUSCULAR | Status: DC | PRN
  Administered 2012-07-02: 25 mL

## 2012-07-02 MED ORDER — PHENOL 1.4 % MT LIQD: 1.0000 | OROMUCOSAL | Status: DC | PRN

## 2012-07-02 MED ORDER — BISACODYL 5 MG PO TBEC: 5.0000 mg | DELAYED_RELEASE_TABLET | Freq: Every day | ORAL | Status: DC | PRN

## 2012-07-02 MED ORDER — PREDNISONE 10 MG PO TABS
10.0000 mg | ORAL_TABLET | Freq: Every day | ORAL | Status: DC
  Administered 2012-07-02: 10 mg via ORAL
  Filled 2012-07-02 (×2): qty 1

## 2012-07-02 MED ORDER — GLYCOPYRROLATE 0.2 MG/ML IJ SOLN
INTRAMUSCULAR | Status: DC | PRN
  Administered 2012-07-02: 0.6 mg via INTRAVENOUS

## 2012-07-02 MED ORDER — FENTANYL CITRATE 0.05 MG/ML IJ SOLN
INTRAMUSCULAR | Status: DC | PRN
  Administered 2012-07-02: 50 ug via INTRAVENOUS
  Administered 2012-07-02: 25 ug via INTRAVENOUS
  Administered 2012-07-02: 100 ug via INTRAVENOUS

## 2012-07-02 MED ORDER — LIDOCAINE HCL (CARDIAC) 20 MG/ML IV SOLN
INTRAVENOUS | Status: DC | PRN
  Administered 2012-07-02: 100 mg via INTRAVENOUS

## 2012-07-02 MED ORDER — MIDAZOLAM HCL 5 MG/5ML IJ SOLN
INTRAMUSCULAR | Status: DC | PRN
  Administered 2012-07-02 (×2): 2 mg via INTRAVENOUS

## 2012-07-02 MED ORDER — POLYETHYLENE GLYCOL 3350 17 G PO PACK: 17.0000 g | PACK | Freq: Every day | ORAL | Status: DC | PRN

## 2012-07-02 MED ORDER — LORAZEPAM 1 MG PO TABS: 2.0000 mg | ORAL_TABLET | Freq: Every evening | ORAL | Status: DC | PRN

## 2012-07-02 MED ORDER — PANTOPRAZOLE SODIUM 40 MG PO TBEC
80.0000 mg | DELAYED_RELEASE_TABLET | Freq: Every day | ORAL | Status: DC
  Administered 2012-07-03: 80 mg via ORAL
  Filled 2012-07-02: qty 2

## 2012-07-02 MED ORDER — DIPHENHYDRAMINE HCL 12.5 MG/5ML PO ELIX: 12.5000 mg | ORAL_SOLUTION | ORAL | Status: DC | PRN

## 2012-07-02 MED ORDER — HEMOSTATIC AGENTS (NO CHARGE) OPTIME
TOPICAL | Status: DC | PRN
  Administered 2012-07-02: 1 via TOPICAL

## 2012-07-02 MED ORDER — OXYCODONE HCL 5 MG PO TABS
5.0000 mg | ORAL_TABLET | ORAL | Status: DC | PRN
Start: 2012-07-02 — End: 2012-07-03
  Administered 2012-07-03: 10 mg via ORAL
  Filled 2012-07-02: qty 2

## 2012-07-02 MED ORDER — HYDROMORPHONE HCL PF 1 MG/ML IJ SOLN
INTRAMUSCULAR | Status: AC
  Filled 2012-07-02: qty 1

## 2012-07-02 MED ORDER — DOCUSATE SODIUM 100 MG PO CAPS
100.0000 mg | ORAL_CAPSULE | Freq: Two times a day (BID) | ORAL | Status: DC
  Administered 2012-07-02 – 2012-07-03 (×3): 100 mg via ORAL
  Filled 2012-07-02 (×4): qty 1

## 2012-07-02 MED ORDER — ASPIRIN EC 325 MG PO TBEC: 325.0000 mg | DELAYED_RELEASE_TABLET | Freq: Two times a day (BID) | ORAL | Status: DC

## 2012-07-02 MED ORDER — PHENYLEPHRINE HCL 10 MG/ML IJ SOLN
10.0000 mg | INTRAVENOUS | Status: DC | PRN
  Administered 2012-07-02: 5 ug/min via INTRAVENOUS

## 2012-07-02 MED ORDER — ONDANSETRON HCL 4 MG/2ML IJ SOLN
INTRAMUSCULAR | Status: AC
  Filled 2012-07-02: qty 2

## 2012-07-02 MED ORDER — ACETAMINOPHEN 10 MG/ML IV SOLN
1000.0000 mg | Freq: Four times a day (QID) | INTRAVENOUS | Status: AC
  Administered 2012-07-02 – 2012-07-03 (×3): 1000 mg via INTRAVENOUS
  Filled 2012-07-02 (×5): qty 100

## 2012-07-02 MED ORDER — METOCLOPRAMIDE HCL 10 MG PO TABS: 5.0000 mg | ORAL_TABLET | Freq: Three times a day (TID) | ORAL | Status: DC | PRN

## 2012-07-02 MED ORDER — GABAPENTIN 300 MG PO CAPS
300.0000 mg | ORAL_CAPSULE | Freq: Three times a day (TID) | ORAL | Status: DC
  Administered 2012-07-02 – 2012-07-03 (×3): 300 mg via ORAL
  Filled 2012-07-02 (×5): qty 1

## 2012-07-02 MED ORDER — ACETAMINOPHEN 325 MG PO TABS: 650.0000 mg | ORAL_TABLET | Freq: Four times a day (QID) | ORAL | Status: DC | PRN

## 2012-07-02 MED ORDER — SODIUM CHLORIDE 0.9 % IV SOLN: INTRAVENOUS | Status: DC

## 2012-07-02 MED ORDER — MONTELUKAST SODIUM 10 MG PO TABS
10.0000 mg | ORAL_TABLET | Freq: Every day | ORAL | Status: DC
  Administered 2012-07-03: 10 mg via ORAL
  Filled 2012-07-02 (×2): qty 1

## 2012-07-02 MED ORDER — LACTATED RINGERS IV SOLN
INTRAVENOUS | Status: DC | PRN
  Administered 2012-07-02 (×2): via INTRAVENOUS

## 2012-07-02 MED ORDER — 0.9 % SODIUM CHLORIDE (POUR BTL) OPTIME
TOPICAL | Status: DC | PRN
  Administered 2012-07-02: 1000 mL

## 2012-07-02 MED ORDER — ASPIRIN EC 325 MG PO TBEC
325.0000 mg | DELAYED_RELEASE_TABLET | Freq: Two times a day (BID) | ORAL | Status: DC
  Administered 2012-07-02 – 2012-07-03 (×2): 325 mg via ORAL
  Filled 2012-07-02 (×3): qty 1

## 2012-07-02 MED ORDER — PHENYLEPHRINE HCL 10 MG/ML IJ SOLN
INTRAMUSCULAR | Status: DC | PRN
  Administered 2012-07-02: 40 ug via INTRAVENOUS
  Administered 2012-07-02: 80 ug via INTRAVENOUS
  Administered 2012-07-02 (×2): 40 ug via INTRAVENOUS

## 2012-07-02 MED ORDER — ONDANSETRON HCL 4 MG PO TABS: 4.0000 mg | ORAL_TABLET | Freq: Four times a day (QID) | ORAL | Status: DC | PRN

## 2012-07-02 MED ORDER — CEFAZOLIN SODIUM-DEXTROSE 2-3 GM-% IV SOLR
2.0000 g | Freq: Four times a day (QID) | INTRAVENOUS | Status: AC
Start: 2012-07-02 — End: 2012-07-03
  Administered 2012-07-02 – 2012-07-03 (×3): 2 g via INTRAVENOUS
  Filled 2012-07-02 (×3): qty 50

## 2012-07-02 MED ORDER — MORPHINE SULFATE 2 MG/ML IJ SOLN: 1.0000 mg | INTRAMUSCULAR | Status: DC | PRN

## 2012-07-02 SURGICAL SUPPLY — 78 items
ASSEMBLY NECK TAPER FIXED 135 (Orthopedic Implant) ×1 IMPLANT
BLADE SAW SAG 73X25 THK (BLADE) ×1
BLADE SAW SGTL 73X25 THK (BLADE) ×1 IMPLANT
BLADE SURG 15 STRL LF DISP TIS (BLADE) ×1 IMPLANT
BLADE SURG 15 STRL SS (BLADE) ×2
BOWL SMART MIX CTS (DISPOSABLE) IMPLANT
CEMENT BONE DEPUY (Cement) ×2 IMPLANT
CHLORAPREP W/TINT 26ML (MISCELLANEOUS) ×2 IMPLANT
CLOTH BEACON ORANGE TIMEOUT ST (SAFETY) ×2 IMPLANT
COVER SURGICAL LIGHT HANDLE (MISCELLANEOUS) ×2 IMPLANT
DRAPE INCISE IOBAN 66X45 STRL (DRAPES) ×2 IMPLANT
DRAPE SURG 17X23 STRL (DRAPES) ×2 IMPLANT
DRAPE U-SHAPE 47X51 STRL (DRAPES) ×3 IMPLANT
DRSG ADAPTIC 3X8 NADH LF (GAUZE/BANDAGES/DRESSINGS) IMPLANT
DRSG MEPILEX BORDER 4X4 (GAUZE/BANDAGES/DRESSINGS) ×1 IMPLANT
DRSG MEPILEX BORDER 4X8 (GAUZE/BANDAGES/DRESSINGS) ×2 IMPLANT
DRSG PAD ABDOMINAL 8X10 ST (GAUZE/BANDAGES/DRESSINGS) ×4 IMPLANT
ELECT BLADE 4.0 EZ CLEAN MEGAD (MISCELLANEOUS) ×2
ELECT REM PT RETURN 9FT ADLT (ELECTROSURGICAL) ×2
ELECTRODE BLDE 4.0 EZ CLN MEGD (MISCELLANEOUS) ×1 IMPLANT
ELECTRODE REM PT RTRN 9FT ADLT (ELECTROSURGICAL) ×1 IMPLANT
EPIPHYSIS BODY POROCOAT SZ10 (Orthopedic Implant) ×1 IMPLANT
EVACUATOR 1/8 PVC DRAIN (DRAIN) ×3 IMPLANT
GLENOID ANCHOR PEG CROSSLK 48 (Orthopedic Implant) ×1 IMPLANT
GLOVE BIO SURGEON STRL SZ7 (GLOVE) ×2 IMPLANT
GLOVE BIO SURGEON STRL SZ7.5 (GLOVE) ×2 IMPLANT
GLOVE BIOGEL PI IND STRL 6.5 (GLOVE) IMPLANT
GLOVE BIOGEL PI IND STRL 7.0 (GLOVE) ×1 IMPLANT
GLOVE BIOGEL PI IND STRL 8 (GLOVE) ×1 IMPLANT
GLOVE BIOGEL PI INDICATOR 6.5 (GLOVE) ×1
GLOVE BIOGEL PI INDICATOR 7.0 (GLOVE) ×1
GLOVE BIOGEL PI INDICATOR 8 (GLOVE) ×1
GLOVE SURG SS PI 7.0 STRL IVOR (GLOVE) ×1 IMPLANT
GOWN PREVENTION PLUS LG XLONG (DISPOSABLE) ×2 IMPLANT
GOWN STRL NON-REIN LRG LVL3 (GOWN DISPOSABLE) ×1 IMPLANT
GOWN STRL REIN XL XLG (GOWN DISPOSABLE) ×2 IMPLANT
HANDPIECE INTERPULSE COAX TIP (DISPOSABLE) ×2
HEMOSTAT SURGICEL 2X14 (HEMOSTASIS) ×2 IMPLANT
HOOD PEEL AWAY FACE SHEILD DIS (HOOD) ×4 IMPLANT
KIT BASIN OR (CUSTOM PROCEDURE TRAY) ×2 IMPLANT
KIT ROOM TURNOVER OR (KITS) ×2 IMPLANT
MANIFOLD NEPTUNE II (INSTRUMENTS) ×2 IMPLANT
NDL HYPO 25GX1X1/2 BEV (NEEDLE) IMPLANT
NDL MAYO TROCAR (NEEDLE) ×1 IMPLANT
NEEDLE HYPO 25GX1X1/2 BEV (NEEDLE) IMPLANT
NEEDLE MAYO TROCAR (NEEDLE) ×2 IMPLANT
NS IRRIG 1000ML POUR BTL (IV SOLUTION) ×2 IMPLANT
PACK SHOULDER (CUSTOM PROCEDURE TRAY) ×2 IMPLANT
PAD ARMBOARD 7.5X6 YLW CONV (MISCELLANEOUS) ×4 IMPLANT
PIN METAGLENE 2.5 (PIN) ×1 IMPLANT
RETRIEVER SUT HEWSON (MISCELLANEOUS) ×1 IMPLANT
SET HNDPC FAN SPRY TIP SCT (DISPOSABLE) ×1 IMPLANT
SLING ARM IMMOBILIZER LRG (SOFTGOODS) ×1 IMPLANT
SLING ARM IMMOBILIZER MED (SOFTGOODS) ×1 IMPLANT
SMARTMIX MINI TOWER (MISCELLANEOUS) ×2
SPONGE GAUZE 4X4 12PLY (GAUZE/BANDAGES/DRESSINGS) ×2 IMPLANT
SPONGE LAP 18X18 X RAY DECT (DISPOSABLE) ×2 IMPLANT
SPONGE LAP 4X18 X RAY DECT (DISPOSABLE) ×5 IMPLANT
STEM GLOBAL AP 12MM (Stem) ×1 IMPLANT
STRIP CLOSURE SKIN 1/2X4 (GAUZE/BANDAGES/DRESSINGS) ×3 IMPLANT
SUCTION FRAZIER TIP 10 FR DISP (SUCTIONS) ×2 IMPLANT
SUPPORT WRAP ARM LG (MISCELLANEOUS) ×2 IMPLANT
SUT ETHIBOND 2 OS 4 DA (SUTURE) ×6 IMPLANT
SUT ETHIBOND NAB CT1 #1 30IN (SUTURE) ×3 IMPLANT
SUT FIBERWIRE #2 38 T-5 BLUE (SUTURE) ×6
SUT MNCRL AB 4-0 PS2 18 (SUTURE) ×2 IMPLANT
SUT SILK 2 0 TIES 17X18 (SUTURE)
SUT SILK 2-0 18XBRD TIE BLK (SUTURE) ×1 IMPLANT
SUT VIC AB 0 CTB1 27 (SUTURE) ×2 IMPLANT
SUT VIC AB 2-0 CT1 27 (SUTURE) ×4
SUT VIC AB 2-0 CT1 TAPERPNT 27 (SUTURE) ×1 IMPLANT
SUTURE FIBERWR #2 38 T-5 BLUE (SUTURE) IMPLANT
SYR CONTROL 10ML LL (SYRINGE) ×1 IMPLANT
TOWEL OR 17X24 6PK STRL BLUE (TOWEL DISPOSABLE) ×2 IMPLANT
TOWEL OR 17X26 10 PK STRL BLUE (TOWEL DISPOSABLE) ×2 IMPLANT
TOWER SMARTMIX MINI (MISCELLANEOUS) ×1 IMPLANT
TRAY FOLEY CATH 14FR (SET/KITS/TRAYS/PACK) IMPLANT
WATER STERILE IRR 1000ML POUR (IV SOLUTION) ×2 IMPLANT

## 2012-07-02 NOTE — H&P (Signed)
Allison [name] is an 62 y.o. female.   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 Failed arthroscopic treatment, injections and activity modification.  Past Medical History  Diagnosis Date  . Hypertension   . GERD (gastroesophageal reflux disease)   . Arthritis     all over  . AC (acromioclavicular) joint bone spurs     spurs in shoulders with pain  . Complication of anesthesia   . PONV (postoperative nausea and vomiting)   . Hypothyroidism   . Asthma     from reflux  . Deaf     Past Surgical History  Procedure Laterality Date  . Righr shoulder surgery  1999    spurs  . Wisdom tooth extraction    . Tonsillectomy    . Tubal ligation    . Thyroid surgery  1991  . Vericose surgery right leg  05-1998  . Eye surgery      catarcts  . Esophagogastroduodenoscopy endoscopy      several times  . Colonoscopy  [id number]  . Colonoscopy with propofol  04/09/2012    Procedure: COLONOSCOPY WITH PROPOFOL;  Surgeon: Charolett Bumpers, MD;  Location: [facility];  Service: Endoscopy;  Laterality: N/A;  . Abdominal hysterectomy      partial    History reviewed. No pertinent family history. Social History:  reports that she has never smoked. She has never used smokeless tobacco. She reports that she does not drink alcohol or use illicit drugs.  Allergies:  Allergies  Allergen Reactions  . Aleve (Naproxen) Other (See Comments)    Stomach ache  . Bextra (Valdecoxib) Other (See Comments)    Stomach ache   . Biaxin (Clarithromycin) Other (See Comments)    Dizzy, blurred vision, achy stomach  . Doxycycline Other (See Comments)    Stomach ache  . Durabac (Apap-Salicyl-Phenyltolox-Caff) Nausea And Vomiting  . Estradiol Other (See Comments)    Hurt stomach  . Levaquin (Levofloxacin) Other (See Comments)    Dizzy, achy stomach, can't sleep  . Macrobid (Nitrofurantoin)   . Motrin (Ibuprofen) Other (See Comments)    Stomach ahce  .  Oxaprozin Other (See Comments)    Hurt stomach  . Robaxin (Methocarbamol) Other (See Comments)    Hurt stomach  . Sulfa Antibiotics Other (See Comments)    Blister on large toe  . Topamax (Topiramate) Nausea And Vomiting  . Trazodone And Nefazodone Other (See Comments)    Weakness in legs, numbness in arms, funny feeling, rapid heart beat, loose focus    Medications Prior to Admission  Medication Sig Dispense Refill  . atenolol (TENORMIN) 50 MG tablet Take 50 mg by mouth daily before breakfast.       . Calcium Carbonate-Vitamin D (CALTRATE 600+D) 600-400 MG-UNIT per tablet Take 1 tablet by mouth daily.      . Cyanocobalamin (VITAMIN B-12) 5000 MCG SUBL Place 1 tablet under the tongue. For nerve pain in feet and metabolism support      . cyclobenzaprine (FLEXERIL) 5 MG tablet Take 5 mg by mouth 3 (three) times daily as needed. Muscle spasms      . diclofenac (VOLTAREN) 75 MG EC tablet Take 75 mg by mouth daily as needed. For arthritis and stiffness      . diphenhydrAMINE (BENADRYL) 25 mg capsule Take 25 mg by mouth every 6 (six) hours as needed. For itching      . esomeprazole (NEXIUM) 40 MG capsule Take 40 mg by mouth daily  before breakfast.      . estradiol (ESTRACE) 1 MG tablet Take 1 mg by mouth daily.      Marland Kitchen gabapentin (NEURONTIN) 300 MG capsule Take 300 mg by mouth 3 (three) times daily. For nerve pain in both feet      . Guaifenesin-Codeine (IOPHEN C-NR PO) Take 15 mLs by mouth every 6 (six) hours as needed. For cough      . HYDROcodone-acetaminophen (VICODIN) 5-500 MG per tablet Take 1 tablet by mouth every 6 (six) hours as needed. For pain for fibromyalgia      . levothyroxine (SYNTHROID, LEVOTHROID) 112 MCG tablet Take 112 mcg by mouth every morning.      Marland Kitchen LORazepam (ATIVAN) 2 MG tablet Take 2 mg by mouth at bedtime as needed. For restful sleep      . methocarbamol (ROBAXIN) 750 MG tablet Take 750 mg by mouth 3 (three) times daily.      . montelukast (SINGULAIR) 10 MG tablet Take  10 mg by mouth daily before breakfast.       . OVER THE COUNTER MEDICATION Take 1 tablet by mouth every 4 (four) hours as needed. For pain. [employer] Backache Relief Extra Strength.      . predniSONE (DELTASONE) 10 MG tablet Take 10 mg by mouth daily.       . promethazine (PHENERGAN) 25 MG tablet Take 25 mg by mouth every 8 (eight) hours as needed. For nasuea and vomiting      . Simethicone (GAS-X PO) Take 1 tablet by mouth daily as needed. For gas      . oxaprozin (DAYPRO) 600 MG tablet Take 1,200 mg by mouth daily.        Results for orders placed during the hospital encounter of 07/02/12 (from the past 48 hour(s))  URINALYSIS, ROUTINE W REFLEX MICROSCOPIC     Status: None   Collection Time    07/02/12  6:33 AM      Result Value Range   Color, Urine YELLOW  YELLOW   APPearance CLEAR  CLEAR   Specific Gravity, Urine 1.011  1.005 - 1.030   pH 5.0  5.0 - 8.0   Glucose, UA NEGATIVE  NEGATIVE mg/dL   Hgb urine dipstick NEGATIVE  NEGATIVE   Bilirubin Urine NEGATIVE  NEGATIVE   Ketones, ur NEGATIVE  NEGATIVE mg/dL   Protein, ur NEGATIVE  NEGATIVE mg/dL   Urobilinogen, UA 0.2  0.0 - 1.0 mg/dL   Nitrite NEGATIVE  NEGATIVE   Leukocytes, UA NEGATIVE  NEGATIVE   Comment: MICROSCOPIC NOT DONE ON URINES WITH NEGATIVE PROTEIN, BLOOD, LEUKOCYTES, NITRITE, OR GLUCOSE <1000 mg/dL.   Dg Chest 2 View  07/02/2012  *RADIOLOGY REPORT*  Clinical Data: Preoperative chest radiograph.  CHEST - 2 VIEW  Comparison: Chest radiograph performed 11/18/2007  Findings: The lungs are well-aerated and clear.  There is no evidence of focal opacification, pleural effusion or pneumothorax.  The heart is normal in size; the mediastinal contour is within normal limits.  No acute osseous abnormalities are seen.  Mild degenerative change is noted at the right glenohumeral joint.  IMPRESSION: No acute cardiopulmonary process seen.   Original Report Authenticated By: Tonia Ghent, M.D.     Review of Systems  All other systems  reviewed and are negative.    Blood pressure 149/72, pulse 56, temperature 98.1 F (36.7 C), temperature source Oral, resp. rate 20, height 5\' 7"  (1.702 m), weight 95.255 kg (210 lb), SpO2 98.00%. Physical Exam  Constitutional: She is oriented  to person, place, and time. She appears well-developed and well-nourished.  HENT:  Head: Atraumatic.  Eyes: EOM are normal.  Cardiovascular: Intact distal pulses.   Respiratory: Effort normal.  Musculoskeletal:       Right shoulder: She exhibits decreased range of motion and pain. She exhibits no swelling, no deformity, normal pulse and normal strength.  Neurological: She is alert and oriented to person, place, and time.  Skin: Skin is warm and dry.  Psychiatric: She has a normal mood and affect.     Assessment/Plan R endstage glenohumeral OA Plan R total shoulder replacement Risks / benefits of surgery discussed Consent on chart  NPO for OR Preop antibiotics   Jameel Quant WILLIAM 07/02/2012, 7:30 AM

## 2012-07-02 NOTE — Anesthesia Procedure Notes (Addendum)
Anesthesia Regional Block:  Supraclavicular block  Pre-Anesthetic Checklist: ,, timeout performed, Correct Patient, Correct Site, Correct Laterality, Correct Procedure, Correct Position, site marked, Risks and benefits discussed,  Surgical consent,  Pre-op evaluation,  At surgeon's request and post-op pain management  Laterality: Right and Upper  Prep: chloraprep       Needles:  Injection technique: Single-shot  Needle Type: Echogenic Needle     Needle Length: 5cm 5 cm Needle Gauge: 21    Additional Needles:  Procedures: ultrasound guided (picture in chart) Supraclavicular block Narrative:  Start time: 07/02/2012 8:00 AM End time: 07/02/2012 8:11 AM Injection made incrementally with aspirations every 5 mL.  Performed by: Personally  Anesthesiologist: Sheldon Silvan  Supraclavicular block Procedure Name: Intubation Date/Time: 07/02/2012 8:29 AM Performed by: Sheppard Evens Pre-anesthesia Checklist: Patient identified, Emergency Drugs available, Suction available, Patient being monitored and Timeout performed Patient Re-evaluated:Patient Re-evaluated prior to inductionOxygen Delivery Method: Circle system utilized Preoxygenation: Pre-oxygenation with 100% oxygen Intubation Type: IV induction Ventilation: Mask ventilation without difficulty Laryngoscope Size: Miller and 2 Grade View: Grade I Tube type: Oral Tube size: 7.5 mm Number of attempts: 1 Airway Equipment and Method: Stylet Placement Confirmation: ETT inserted through vocal cords under direct vision,  breath sounds checked- equal and bilateral and positive ETCO2 Secured at: 22 cm Tube secured with: Tape Dental Injury: Teeth and Oropharynx as per pre-operative assessment

## 2012-07-02 NOTE — Progress Notes (Signed)
Orthopedic Tech Progress Note Patient Details:  Allison [name] April 14, 1951 [id number] Replacement arm sling applied to patient's right UE. Sling patient was given in OR was saturated with blood.  Ortho Devices Type of Ortho Device: Arm sling Ortho Device/Splint Interventions: Application   Asia R Thompson 07/02/2012, 1:23 PM

## 2012-07-02 NOTE — Anesthesia Postprocedure Evaluation (Signed)
  Anesthesia Post-op Note  Patient: Allison [name]  Procedure(s) Performed: Procedure(s) with comments: TOTAL SHOULDER ARTHROPLASTY (Right) - Right total shoulder arthroplasty  Patient Location: PACU  Anesthesia Type:GA combined with regional for post-op pain  Level of Consciousness: awake, oriented, sedated and patient cooperative  Airway and Oxygen Therapy: Patient Spontanous Breathing  Post-op Pain: none  Post-op Assessment: Post-op Vital signs reviewed, Patient's Cardiovascular Status Stable, Respiratory Function Stable, Patent Airway, No signs of Nausea or vomiting and Pain level controlled  Post-op Vital Signs: stable  Complications: No apparent anesthesia complications

## 2012-07-02 NOTE — Op Note (Signed)
Procedure(s): TOTAL SHOULDER ARTHROPLASTY Procedure Note  Allison [name] female 62 y.o. 07/02/2012  Procedure(s) and Anesthesia Type:    * RIGHT TOTAL SHOULDER ARTHROPLASTY - General  Surgeon(s) and Role:    * Mable Paris, MD - Primary   Indications:  62 y.o. female  With endstage right shoulder arthritis. Pain and dysfunction interfered with quality of life and nonoperative treatment with activity modification, NSAIDS and injections failed.     Surgeon: Mable Paris   Assistants: Damita Lack PA-C [facility] was present and scrubbed throughout the procedure and was essential in positioning, retraction, exposure, and closure)  Anesthesia: General endotracheal anesthesia with preoperative interscalene block    Procedure Detail  TOTAL SHOULDER ARTHROPLASTY  Findings: DePuy size 12 porocoated stem with excellent press fit, 48 x 18 centered head, 48 anchor peg glenoid. Excellent stability. A lesser tuberosity osteotomy was performed.  Estimated Blood Loss:  200 mL         Drains: 1 medium hemovac  Blood Given: none          Specimens: none        Complications:  * No complications entered in OR log *         Disposition: PACU - hemodynamically stable.         Condition: stable    Procedure:   The patient was identified in the preoperative holding area where I personally marked the operative extremity after verifying with the patient and consent. She  was taken to the operating room where She was transferred to the   operative table.  The patient received an interscalene block in   the holding area by the attending anesthesiologist.  General anesthesia was induced   in the operating room without complication.  The patient did receive IV  Ancef prior to the commencement of the procedure.  The patient was   placed in the beach-chair position with the back raised about 30   degrees.  The nonoperative extremity and head and neck were  carefully   positioned and padded protecting against neurovascular compromise.  The   left upper extremity was then prepped and draped in the standard sterile   fashion.    The appropriate operative time-out was performed with   Anesthesia, the perioperative staff, as well as myself and we all agreed   that the right side was the correct operative site.  An approximately   10 cm incision was made from the tip of the coracoid to the center point of the   humerus at the level of the axilla.  Dissection was carried down sharply   through subcutaneous tissues and cephalic vein was identified and taken   laterally with the deltoid.  The pectoralis major was taken medially.  The   upper 1 cm of the pectoralis major was released from its attachment on   the humerus.  The clavipectoral fascia was incised just lateral to the   conjoined tendon.  This incision was carried up to but not into the   coracoacromial ligament.  Digital palpation was used to prove   integrity of the axillary nerve which was protected throughout the   procedure.  Musculocutaneous nerve was not palpated in the operative   field.  Conjoined tendon was then retracted gently medially and the   deltoid laterally.  Anterior circumflex humeral vessels were clamped and   coagulated.  The soft tissues overlying the biceps was incised and this   incision was carried across the transverse  humeral ligament to the base   of the coracoid.  The biceps was tenodesed to the soft tissue just above   pectoralis major and the remaining portion of the biceps superiorly was   excised.  An osteotomy was performed at the lesser tuberosity and the   subscapularis was freed from the underlying capsule.  Capsule was then   released all the way down to the 6 o'clock position of the humeral head.   The humeral head was then delivered with simultaneous adduction,   extension and external rotation.  All humeral osteophytes were removed   and the  anatomic neck of the humerus was marked and cut free hand at   approximately 25 degrees retroversion within about 3 mm of the cuff   reflection posteriorly.  The head size was estimated to be a 48 medium   offset.  At that point, the humeral head was retracted posteriorly with   a Fukuda retractor and the anterior-inferior capsule was excised.   Remaining portion of the capsule was released at the base of the   coracoid.  The remaining biceps anchor and the entire anterior-inferior   labrum was excised.  The posterior labrum was also excised but the   posterior capsule was not released.  The guidepin was placed bicortically with 0 elevated guide.  The reamer was used to ream to concentric bone with punctate bleeding.  This gave an excellent concentric surface.  The center hole was then drilled for an anchor peg glenoid followed by the three peripheral holes and none of the holes   exited the glenoid wall.  I then pulse irrigated these holes and dried   them with Surgicel.  The three peripheral holes were then   pressurized cemented and the anchor peg glenoid was placed and impacted   with an excellent fit.  The glenoid was a 48 component.  The proximal humerus was then again exposed taking care not to displace the glenoid.    The humerus was then sequentially reamed going from 6 to 12 by 2 mm incriments. The 12 mm reamer was found to have appropriate cortical contact.  A   box osteotome was then used and a 12-mm broach.  The broach handle was   removed and the trial head was placed.   Calcar reamer was used.The centered 48 x 18 head fit best.  With the trial implantation of the component, there was   approximately 50% posterior translation with immediate snap back to the   anatomic position.  With forward elevation, there was no tendency   towards posterior subluxation.    The trial was removed and the final implant was prepared on a back table.   Small holes were drilled on both sides of the  lesser tuberosity osteotomy, through which 3 #2 FiberWire is were passed. The implant was then placed through the loop of all 3 #2 FiberWires and impacted with an excellent press-fit. This achieved excellent anatomic reconstruction of the proximal humerus.  The joint was then copiously irrigated with pulse lavage.  The subscapularis and   lesser tuberosity osteotomy were then repaired using the 3 #2 fiber wires previously passed.   One #1 Ethibond was placed at the rotator interval just above   the lesser tuberosity.  After repair of the lesser tuberosity, a medium   Hemovac was placed out anterolaterally and again copious irrigation was   used.   Skin was closed with 2-0 Vicryl sutures in the deep dermal layer  and 4-0 Monocryl in a subcuticular  running fashion.  Sterile dressings were then applied including Steri- Strips, 4x4s, ABDs and tape.  The patient was placed in a sling and allowed to awaken from general anesthesia and taken to the recovery room in stable  condition.      POSTOPERATIVE PLAN:  Early passive range of motion will be allowed with the goal of 40 degrees external rotation and a 140 degrees forward elevation.  No internal rotation at this time.  No active motion of the arm until the lesser tuberosity heels.  The patient will likely be kept in the hospital for 1-2 days and then discharged home.

## 2012-07-02 NOTE — Transfer of Care (Signed)
Immediate Anesthesia Transfer of Care Note  Patient: Allison [name]  Procedure(s) Performed: Procedure(s) with comments: TOTAL SHOULDER ARTHROPLASTY (Right) - Right total shoulder arthroplasty  Patient Location: PACU  Anesthesia Type:General  Level of Consciousness: awake, alert  and oriented  Airway & Oxygen Therapy: Patient Spontanous Breathing and Patient connected to nasal cannula oxygen  Post-op Assessment: Report given to PACU RN and Post -op Vital signs reviewed and stable  Post vital signs: Reviewed and stable  Complications: No apparent anesthesia complications

## 2012-07-02 NOTE — Anesthesia Preprocedure Evaluation (Addendum)
Anesthesia Evaluation  Patient identified by MRN, date of birth, ID band Patient awake    Reviewed: Allergy & Precautions, H&P , NPO status , Patient's Chart, lab work & pertinent test results, reviewed documented beta blocker date and time   History of Anesthesia Complications (+) PONV  Airway Mallampati: I TM Distance: >3 FB Neck ROM: full    Dental  (+) Teeth Intact and Dental Advisory Given   Pulmonary asthma ,  breath sounds clear to auscultation        Cardiovascular hypertension, Pt. on medications and Pt. on home beta blockers Rhythm:regular Rate:Normal     Neuro/Psych    GI/Hepatic GERD-  Medicated and Controlled,  Endo/Other  Hypothyroidism   Renal/GU      Musculoskeletal  (+) Fibromyalgia -, narcotic dependent  Abdominal   Peds  Hematology   Anesthesia Other Findings   Reproductive/Obstetrics                         Anesthesia Physical Anesthesia Plan  ASA: II  Anesthesia Plan: General and Regional   Post-op Pain Management: MAC Combined w/ Regional for Post-op pain   Induction: Intravenous  Airway Management Planned: Oral ETT  Additional Equipment:   Intra-op Plan:   Post-operative Plan: Extubation in OR  Informed Consent: I have reviewed the patients History and Physical, chart, labs and discussed the procedure including the risks, benefits and alternatives for the proposed anesthesia with the patient or authorized representative who has indicated his/her understanding and acceptance.   Dental advisory given  Plan Discussed with: CRNA, Anesthesiologist and Surgeon  Anesthesia Plan Comments:        Anesthesia Quick Evaluation

## 2012-07-03 ENCOUNTER — Encounter (HOSPITAL_COMMUNITY): Payer: Self-pay | Admitting: Orthopedic Surgery

## 2012-07-03 DIAGNOSIS — M19011 Primary osteoarthritis, right shoulder: Secondary | ICD-10-CM

## 2012-07-03 LAB — CBC
HCT: 34.4 % — ABNORMAL LOW (ref 36.0–46.0)
MCH: 26.8 pg (ref 26.0–34.0)
MCV: 79.4 fL (ref 78.0–100.0)
RBC: 4.33 MIL/uL (ref 3.87–5.11)
RDW: 14.9 % (ref 11.5–15.5)
WBC: 12.1 10*3/uL — ABNORMAL HIGH (ref 4.0–10.5)

## 2012-07-03 LAB — BASIC METABOLIC PANEL
BUN: 11 mg/dL (ref 6–23)
CO2: 24 mEq/L (ref 19–32)
Chloride: 104 mEq/L (ref 96–112)
Creatinine, Ser: 0.96 mg/dL (ref 0.50–1.10)

## 2012-07-03 NOTE — Progress Notes (Signed)
PATIENT ID: Allison [name]   1 Day Post-Op Procedure(s) (LRB): TOTAL SHOULDER ARTHROPLASTY (Right)  Subjective: Feeling well today, reports some soreness in right shoulder but pain is under control. Reports hand is still numb but somewhat improving. No other complaints or concerns. Ready to go home today.  Objective:  Filed Vitals:   07/03/12 0505  BP: 110/48  Pulse: 56  Temp: 97.7 F (36.5 C)  Resp: 18     Awake, alert, orientated R UE dressing clean, dry, intact Drain removed today Not quite firing biceps and deltoid, dull sensation to light touch of right hand Vascularly intact Repositioned in sling  Labs:   Recent Labs  07/02/12 0650 07/03/12 0700  HGB 13.0 11.6*   Recent Labs  07/02/12 0650 07/03/12 0700  WBC 6.2 12.1*  RBC 4.87 4.33  HCT 40.8 34.4*  PLT 239 245   Recent Labs  07/02/12 0650 07/03/12 0700  NA 141 141  K 4.1 3.7  CL 105 104  CO2 25 24  BUN 12 11  CREATININE 1.09 0.96  GLUCOSE 87 113*  CALCIUM 9.8 9.4    Assessment and Plan: 1 day s/p right total shoulder arthroplasty Will work with OT today on passive ROM limited to 40 ER, 140FF Right hand numbness and lack of bicep firing likely due to resiudal block, should wear off in a few hours Drain removed today Likely d/c home today after working with PT Percocet 1-2 po q 4-6 hrs prn pain for home pain control, ASA325mg  BID x2 weeks   VTE proph: SCDs, ASA 325mg  BIDx2 weeks

## 2012-07-03 NOTE — Evaluation (Signed)
Occupational Therapy Evaluation Patient Details Name: Allison [name] MRN: [id number] DOB: 09/26/1950 Today's Date: 07/03/2012 Time: 0906-1005 OT Time Calculation (min): 59 min  OT Assessment / Plan / Recommendation Clinical Impression  Pt is a pleasant 62 yr old female admitted for shoulder replacement on the right side.  She presents with increased shoulder pain and decreased overall strength.  Will bnefit from acute care OT to help provide education on compensation strategies for selfcare as well as education on positiong and exercises allowed.      OT Assessment  Patient needs continued OT Services    Follow Up Recommendations  No OT follow up       Equipment Recommendations  None recommended by OT       Frequency  Min 2X/week    Precautions / Restrictions Precautions Precautions: Shoulder Type of Shoulder Precautions: No AROM right shoulder, OK for AAROM/PROM shoulder flexion to 140 degrees and internal/external rotation with dowel Precaution Booklet Issued: Yes (comment) Restrictions Weight Bearing Restrictions: Yes RUE Weight Bearing: Non weight bearing   Pertinent Vitals/Pain Pt with pain 2/10 in her shoulder    ADL  Eating/Feeding: Simulated;Set up Where Assessed - Eating/Feeding: Chair Grooming: Simulated;Set up Where Assessed - Grooming: Unsupported sitting Upper Body Bathing: Simulated;Minimal assistance Where Assessed - Upper Body Bathing: Unsupported sitting Lower Body Bathing: Simulated;Supervision/safety Where Assessed - Lower Body Bathing: Supported sit to stand Upper Body Dressing: Simulated;Minimal assistance Where Assessed - Upper Body Dressing: Unsupported sitting Lower Body Dressing: Simulated;Supervision/safety Where Assessed - Lower Body Dressing: Supported sit to stand Toilet Transfer: Performed;Independent Toilet Transfer Method: Other (comment) (ambulate without assistive device) Toilet Transfer Equipment: Regular height toilet Toileting -  Clothing Manipulation and Hygiene: Simulated;Supervision/safety Where Assessed - Toileting Clothing Manipulation and Hygiene: Sit on 3-in-1 or toilet Tub/Shower Transfer Method: Not assessed Equipment Used: Other (comment) (sling) Transfers/Ambulation Related to ADLs: Pt is independent with mobility ADL Comments: Pt is deaf but able to read lips.  Educated her on positioning, AAROM/AROM exercises for the shoulder and non-involved joints per orders, selfcare tasks, and sling wear.  Pt requests one more visit to help with educating her husband.    OT Diagnosis: Generalized weakness;Acute pain  OT Problem List: Decreased strength;Decreased range of motion;Decreased knowledge of precautions;Impaired UE functional use OT Treatment Interventions: Therapeutic exercise;Self-care/ADL training;Patient/family education   OT Goals Acute Rehab OT Goals OT Goal Formulation: With patient Time For Goal Achievement: 07/10/12 Potential to Achieve Goals: Good Miscellaneous OT Goals Miscellaneous OT Goal #1: Pt's husband will be independent with donning sling. Miscellaneous OT Goal #2: Pt's husband will demonstrate independence with helping pt during dressing, bathing, and exercises for the RUE OT Goal: Miscellaneous Goal #2 - Progress: Goal set today  Visit Information  Last OT Received On: 07/03/12 Assistance Needed: +1    Subjective Data  Subjective: My husband is handicapped so I'll have to get my son to help me. Patient Stated Goal: Pt wants to get back   Prior Functioning     Home Living Lives With: Spouse Available Help at Discharge: Family Home Adaptive Equipment: None Prior Function Level of Independence: Independent Able to Take Stairs?: Yes Driving: No Communication Communication: Deaf Dominant Hand: Right         Vision/Perception Vision - History Baseline Vision: Bifocals Patient Visual Report: No change from baseline Vision - Assessment Eye Alignment: Within Functional  Limits Vision Assessment: Vision not tested Perception Perception: Within Functional Limits Praxis Praxis: Intact   Cognition  Cognition Overall Cognitive Status: Appears within  functional limits for tasks assessed/performed Arousal/Alertness: Awake/alert Orientation Level: Appears intact for tasks assessed Behavior During Session: [facility] for tasks performed    Extremity/Trunk Assessment Right Upper Extremity Assessment RUE ROM/Strength/Tone: Deficits;Unable to fully assess;Due to precautions RUE ROM/Strength/Tone Deficits: Pt with RUE in sling, able to demonstrate 90% of full digit flexion and -20 -140 degrees elbow flexion.   RUE Sensation: Deficits RUE Sensation Deficits: Pt still with numbness in fingers throughout. RUE Coordination: Deficits RUE Coordination Deficits: decreased secondary to numbness and swelling in the digits. Left Upper Extremity Assessment LUE ROM/Strength/Tone: Within functional levels LUE Sensation: WFL - Light Touch LUE Coordination: WFL - gross/fine motor Trunk Assessment Trunk Assessment: Normal     Mobility Bed Mobility Bed Mobility: Supine to Sit Supine to Sit: 6: Modified independent (Device/Increase time);HOB flat Transfers Transfers: Sit to Stand;Stand to Sit Sit to Stand: 7: Independent;With upper extremity assist Stand to Sit: With upper extremity assist;7: Independent        Balance Balance Balance Assessed: Yes Dynamic Standing Balance Dynamic Standing - Balance Support: No upper extremity supported Dynamic Standing - Level of Assistance: 7: Independent   End of Session OT - End of Session Equipment Utilized During Treatment: Other (comment) (sling) Activity Tolerance: Patient tolerated treatment well Patient left: in chair;with call bell/phone within reach Nurse Communication: Mobility status     MCGUIRE,JAMES OTR/L Pager number [phone] 07/03/2012, 10:32 AM

## 2012-07-03 NOTE — Progress Notes (Signed)
UR COMPLETED  

## 2012-07-03 NOTE — Progress Notes (Signed)
Occupational Therapy Treatment Patient Details Name: Allison [name] MRN: [id number] DOB: 02-Nov-1950 Today's Date: 07/03/2012 Time: 8469-6295 OT Time Calculation (min): 25 min  OT Assessment / Plan / Recommendation Comments on Treatment Session Pt with slightly more pain this session than earlier session secondary to arm numbness now wearing off.  Educated pt and spouse on exercises for the shoulder and non-involved joints, sling wear and proper fit, positioning, and selfcare tasks.  No further acute OT needs.    Follow Up Recommendations  No OT follow up       Equipment Recommendations  None recommended by OT          Plan All goals met and education completed, patient discharged from OT services    Precautions / Restrictions Precautions Precautions: Shoulder Type of Shoulder Precautions: No AROM right shoulder, OK for AAROM/PROM shoulder flexion to 140 degrees and internal/external rotation with dowel Precaution Booklet Issued: Yes (comment) Restrictions Weight Bearing Restrictions: Yes RUE Weight Bearing: Non weight bearing   Pertinent Vitals/Pain Pain 4/10 on the faces scale in the right shoulder with AAROM exercises    ADL  Lower Body Dressing: Performed;Set up Transfers/Ambulation Related to ADLs: Pt is independent with mobility ADL Comments: Second visit today to provide education to pt and pt's spouse on positioning, sling wear, exercises, and ADLS.  Pt performed 1 set 61f 10 repetitions for elbow flexion AROM to the right arm, and 1 set of 10 repetitions for external and shoulder flexion AAROM.  Pt able to tolerate 0-70 degrees shoulder flexion.  Pt's husband is able to verbalize safe assist with helping pt during bathing and dressing tasks as well as providing assistance with donning the sling to correct position.    OT Goals Miscellaneous OT Goals OT Goal: Miscellaneous Goal #1 - Progress: Met OT Goal: Miscellaneous Goal #2 - Progress: Met  Visit Information  Last OT  Received On: 07/03/12 Assistance Needed: +1    Subjective Data  Subjective: It hurts more this time than it did earlier today. Patient Stated Goal: Get back to using her right arm like before      Cognition  Cognition Overall Cognitive Status: Appears within functional limits for tasks assessed/performed Arousal/Alertness: Awake/alert Orientation Level: Appears intact for tasks assessed Behavior During Session: [facility] for tasks performed    Mobility  Bed Mobility Bed Mobility: Supine to Sit Supine to Sit: 6: Modified independent (Device/Increase time);HOB flat Transfers Transfers: Sit to Stand;Stand to Sit Sit to Stand: 7: Independent;With upper extremity assist Stand to Sit: With upper extremity assist;7: Independent    Exercises  Donning/doffing shirt without moving shoulder: Caregiver independent with task Method for sponge bathing under operated UE: Caregiver independent with task Donning/doffing sling/immobilizer: Caregiver independent with task Correct positioning of sling/immobilizer: Caregiver independent with task ROM for elbow, wrist and digits of operated UE: Independent Sling wearing schedule (on at all times/off for ADL's): Independent Proper positioning of operated UE when showering: Independent;Caregiver independent with task Positioning of UE while sleeping: Independent;Caregiver independent with task   Balance Balance Balance Assessed: Yes Dynamic Standing Balance Dynamic Standing - Balance Support: No upper extremity supported Dynamic Standing - Level of Assistance: 7: Independent   End of Session OT - End of Session Equipment Utilized During Treatment: Other (comment) (sling) Activity Tolerance: Patient limited by pain Patient left: in bed;with nursing in room;with family/visitor present Nurse Communication: Other (comment) (conclusion of OT needs)     Andreina Outten OTR/L Pager number [phone] 07/03/2012, 4:13 PM

## 2012-07-04 NOTE — Discharge Summary (Signed)
Patient ID: Allison [name] MRN: [id number] DOB/AGE: 05/06/1950 62 y.o.  Admit date: 07/02/2012 Discharge date: 07/04/2012  Admission Diagnoses:  Principal Problem:   Arthritis of right shoulder region   Discharge Diagnoses:  Same  Past Medical History  Diagnosis Date  . Hypertension   . GERD (gastroesophageal reflux disease)   . Arthritis     all over  . AC (acromioclavicular) joint bone spurs     spurs in shoulders with pain  . Complication of anesthesia   . PONV (postoperative nausea and vomiting)   . Hypothyroidism   . Asthma     from reflux  . Deaf   . Fibromyalgia     Surgeries: Procedure(s): TOTAL SHOULDER ARTHROPLASTY on 07/02/2012   Consultants:    Discharged Condition: Improved  Hospital Course: Allison [name] is an 62 y.o. female who was admitted 07/02/2012 for operative treatment ofArthritis of right shoulder region. Patient has severe unremitting pain that affects sleep, daily activities, and work/hobbies. After pre-op clearance the patient was taken to the operating room on 07/02/2012 and underwent  Procedure(s): TOTAL SHOULDER ARTHROPLASTY.    Patient was given perioperative antibiotics: Anti-infectives   Start     Dose/Rate Route Frequency Ordered Stop   07/02/12 1500  ceFAZolin (ANCEF) IVPB 2 g/50 mL premix     2 g 100 mL/hr over 30 Minutes Intravenous Every 6 hours 07/02/12 1427 07/03/12 0336   07/02/12 0600  ceFAZolin (ANCEF) IVPB 2 g/50 mL premix     2 g 100 mL/hr over 30 Minutes Intravenous On call to O.R. 07/01/12 1409 07/02/12 0830       Patient was given sequential compression devices, early ambulation, and ASA 325mg  BID to prevent DVT.  Patient benefited maximally from hospital stay and there were no complications.    Recent vital signs: No data found.    Recent laboratory studies:  Recent Labs  07/02/12 0650 07/03/12 0700  WBC 6.2 12.1*  HGB 13.0 11.6*  HCT 40.8 34.4*  PLT 239 245  NA 141 141  K 4.1 3.7  CL 105 104  CO2 25 24   BUN 12 11  CREATININE 1.09 0.96  GLUCOSE 87 113*  INR 0.87  --   CALCIUM 9.8 9.4     Discharge Medications:     Medication List    STOP taking these medications       diclofenac 75 MG EC tablet  Commonly known as:  VOLTAREN     HYDROcodone-acetaminophen 5-500 MG per tablet  Commonly known as:  VICODIN     oxaprozin 600 MG tablet  Commonly known as:  DAYPRO      TAKE these medications       aspirin EC 325 MG tablet  Take 1 tablet (325 mg total) by mouth 2 (two) times daily.     atenolol 50 MG tablet  Commonly known as:  TENORMIN  Take 50 mg by mouth daily before breakfast.     CALTRATE 600+D 600-400 MG-UNIT per tablet  Generic drug:  Calcium Carbonate-Vitamin D  Take 1 tablet by mouth daily.     cyclobenzaprine 5 MG tablet  Commonly known as:  FLEXERIL  Take 5 mg by mouth 3 (three) times daily as needed. Muscle spasms     diphenhydrAMINE 25 mg capsule  Commonly known as:  BENADRYL  Take 25 mg by mouth every 6 (six) hours as needed. For itching     esomeprazole 40 MG capsule  Commonly known as:  NEXIUM  Take  40 mg by mouth daily before breakfast.     estradiol 1 MG tablet  Commonly known as:  ESTRACE  Take 1 mg by mouth daily.     gabapentin 300 MG capsule  Commonly known as:  NEURONTIN  Take 300 mg by mouth 3 (three) times daily. For nerve pain in both feet     GAS-X PO  Take 1 tablet by mouth daily as needed. For gas     IOPHEN C-NR PO  Take 15 mLs by mouth every 6 (six) hours as needed. For cough     levothyroxine 112 MCG tablet  Commonly known as:  SYNTHROID, LEVOTHROID  Take 112 mcg by mouth every morning.     LORazepam 2 MG tablet  Commonly known as:  ATIVAN  Take 2 mg by mouth at bedtime as needed. For restful sleep     methocarbamol 750 MG tablet  Commonly known as:  ROBAXIN  Take 750 mg by mouth 3 (three) times daily.     montelukast 10 MG tablet  Commonly known as:  SINGULAIR  Take 10 mg by mouth daily before breakfast.     OVER  THE COUNTER MEDICATION  Take 1 tablet by mouth every 4 (four) hours as needed. For pain. [employer] Backache Relief Extra Strength.     oxyCODONE-acetaminophen 5-325 MG per tablet  Commonly known as:  PERCOCET/ROXICET  Take 1-2 tablets by mouth every 4 (four) hours as needed.     predniSONE 10 MG tablet  Commonly known as:  DELTASONE  Take 10 mg by mouth daily.     promethazine 25 MG tablet  Commonly known as:  PHENERGAN  Take 25 mg by mouth every 8 (eight) hours as needed. For nasuea and vomiting     Vitamin B-12 5000 MCG Subl  Place 1 tablet under the tongue. For nerve pain in feet and metabolism support        Diagnostic Studies: Dg Chest 2 View  07/02/2012  *RADIOLOGY REPORT*  Clinical Data: Preoperative chest radiograph.  CHEST - 2 VIEW  Comparison: Chest radiograph performed 11/18/2007  Findings: The lungs are well-aerated and clear.  There is no evidence of focal opacification, pleural effusion or pneumothorax.  The heart is normal in size; the mediastinal contour is within normal limits.  No acute osseous abnormalities are seen.  Mild degenerative change is noted at the right glenohumeral joint.  IMPRESSION: No acute cardiopulmonary process seen.   Original Report Authenticated By: Tonia Ghent, M.D.    Dg Shoulder Right Port  07/02/2012  *RADIOLOGY REPORT*  Clinical Data: Post right total shoulder replacement  PORTABLE RIGHT SHOULDER - 2+ VIEW  Comparison: Portable exam 1122 hours compared to CT shoulder 05/17/2012  Findings: Osseous demineralization. Right humeral prosthesis identified. No gross evidence of fracture or dislocation on single AP view; no orthogonal view for confirmation of glenohumeral alignment. AC joint alignment grossly normal. Surgical drain present. Right basilar atelectasis noted.  IMPRESSION: Right humeral prosthesis without apparent acute complication on single AP view.   Original Report Authenticated By: Ulyses Southward, M.D.     Disposition: 01-Home or Self Care       Discharge Orders   Future Orders Complete By Expires     Call MD / Call 911  As directed     Comments:      If you experience chest pain or shortness of breath, CALL 911 and be transported to the hospital emergency room.  If you develope a fever above 101 F, pus (  white drainage) or increased drainage or redness at the wound, or calf pain, call your surgeon's office.    Constipation Prevention  As directed     Comments:      Drink plenty of fluids.  Prune juice may be helpful.  You may use a stool softener, such as Colace (over the counter) 100 mg twice a day.  Use MiraLax (over the counter) for constipation as needed.    Diet - low sodium heart healthy  As directed     Driving restrictions  As directed     Comments:      No driving until cleared by physician.    Increase activity slowly as tolerated  As directed     Lifting restrictions  As directed     Comments:      No lifting until cleared by physician.       Follow-up Information   Follow up with Mable Paris, MD. Schedule an appointment as soon as possible for a visit in 2 weeks.   Contact information:   [address] [place] [address] [phone]        Signed: Jiles Harold 07/04/2012, 4:05 PM

## 2012-10-14 ENCOUNTER — Other Ambulatory Visit: Payer: Self-pay | Admitting: Orthopedic Surgery

## 2012-10-14 DIAGNOSIS — M545 Low back pain: Secondary | ICD-10-CM

## 2012-10-15 ENCOUNTER — Ambulatory Visit
Admission: RE | Admit: 2012-10-15 | Discharge: 2012-10-15 | Disposition: A | Discharge status: Home / Self Care | Payer: Medicare Other | Source: Ambulatory Visit | Attending: Orthopedic Surgery | Admitting: Orthopedic Surgery

## 2012-10-15 DIAGNOSIS — M545 Low back pain: Secondary | ICD-10-CM

## 2013-04-17 ENCOUNTER — Other Ambulatory Visit: Payer: Self-pay | Admitting: Endocrinology

## 2013-04-17 DIAGNOSIS — E039 Hypothyroidism, unspecified: Secondary | ICD-10-CM

## 2013-04-18 ENCOUNTER — Ambulatory Visit
Admission: RE | Admit: 2013-04-18 | Discharge: 2013-04-18 | Disposition: A | Discharge status: Home / Self Care | Payer: Medicare Other | Source: Ambulatory Visit | Attending: Endocrinology | Admitting: Endocrinology

## 2013-04-18 DIAGNOSIS — E039 Hypothyroidism, unspecified: Secondary | ICD-10-CM

## 2013-06-19 ENCOUNTER — Telehealth: Payer: Self-pay | Admitting: *Deleted

## 2013-06-19 NOTE — Telephone Encounter (Signed)
Patient needs atenolol refill to be sent to [facility]. Thanks, MI

## 2013-06-20 MED ORDER — ATENOLOL 50 MG PO TABS: 50.0000 mg | ORAL_TABLET | Freq: Every day | ORAL | Status: DC

## 2013-06-20 NOTE — Telephone Encounter (Signed)
Refilled

## 2013-07-02 ENCOUNTER — Encounter: Payer: Self-pay | Admitting: Interventional Cardiology

## 2013-07-11 ENCOUNTER — Encounter: Payer: Self-pay | Admitting: Interventional Cardiology

## 2013-07-15 ENCOUNTER — Other Ambulatory Visit: Payer: Self-pay | Admitting: Internal Medicine

## 2013-07-15 DIAGNOSIS — Z1231 Encounter for screening mammogram for malignant neoplasm of breast: Secondary | ICD-10-CM

## 2013-08-01 ENCOUNTER — Ambulatory Visit: Payer: Medicare Other

## 2013-08-22 ENCOUNTER — Encounter (INDEPENDENT_AMBULATORY_CARE_PROVIDER_SITE_OTHER): Payer: Self-pay

## 2013-08-22 ENCOUNTER — Ambulatory Visit
Admission: RE | Admit: 2013-08-22 | Discharge: 2013-08-22 | Disposition: A | Discharge status: Home / Self Care | Payer: Medicare Other | Source: Ambulatory Visit | Attending: Internal Medicine | Admitting: Internal Medicine

## 2013-08-22 DIAGNOSIS — Z1231 Encounter for screening mammogram for malignant neoplasm of breast: Secondary | ICD-10-CM

## 2013-08-28 ENCOUNTER — Encounter: Payer: Self-pay | Admitting: Interventional Cardiology

## 2013-08-28 ENCOUNTER — Ambulatory Visit (INDEPENDENT_AMBULATORY_CARE_PROVIDER_SITE_OTHER): Payer: Medicare Other | Admitting: Interventional Cardiology

## 2013-08-28 VITALS — BP 110/70 | HR 64 | Ht 67.0 in | Wt 224.0 lb

## 2013-08-28 DIAGNOSIS — I4891 Unspecified atrial fibrillation: Secondary | ICD-10-CM

## 2013-08-28 DIAGNOSIS — R1310 Dysphagia, unspecified: Secondary | ICD-10-CM

## 2013-08-28 NOTE — Patient Instructions (Signed)
Your physician recommends that you continue on your current medications as directed. Please refer to the Current Medication list given to you today.  Your physician wants you to follow-up in: 1 year with Dr. Varanasi. You will receive a reminder letter in the mail two months in advance. If you don't receive a letter, please call our office to schedule the follow-up appointment.  

## 2013-08-28 NOTE — Progress Notes (Signed)
Patient ID: Allison [name], female   DOB: 11-08-1950, 63 y.o.   MRN: [id number]    [address] [place], [place]  [address] Phone: [phone] Fax:  [phone]  Date:  08/28/2013   ID:  Allison [name], DOB 28-Apr-1951, MRN [id number]  PCP:  [name], MD      History of Present Illness: Allison [name] is a 63 y.o. female who had atrial fibrillation associated with hyperthyroidism several years ago. She underwent a Cardiolite stress test a year ago which was negative for ischemia. She had successful shoulder surgery. She has not had any chest discomfort or palpitations recently. Her only complaint is trouble swallowing certain solid foods. It is better she drinks something along with trying to swallow the food. She does not report any weight loss.   Wt Readings from Last 3 Encounters:  08/28/13 224 lb (101.606 kg)  07/01/12 210 lb (95.255 kg)  07/01/12 210 lb (95.255 kg)     Past Medical History  Diagnosis Date  . Hypertension   . GERD (gastroesophageal reflux disease)   . Arthritis     all over  . AC (acromioclavicular) joint bone spurs     spurs in shoulders with pain  . Complication of anesthesia   . PONV (postoperative nausea and vomiting)   . Hypothyroidism   . Asthma     from reflux  . Deaf   . Fibromyalgia   . Arrhythmia     Current Outpatient Prescriptions  Medication Sig Dispense Refill  . ALPRAZolam (XANAX) 0.25 MG tablet       . aspirin EC 325 MG tablet Take 1 tablet (325 mg total) by mouth 2 (two) times daily.  30 tablet  0  . atenolol (TENORMIN) 50 MG tablet Take 1 tablet (50 mg total) by mouth daily before breakfast.  90 tablet  0  . Calcium Carbonate-Vitamin D (CALTRATE 600+D) 600-400 MG-UNIT per tablet Take 1 tablet by mouth daily.      . cefUROXime (CEFTIN) 500 MG tablet       . Cyanocobalamin (VITAMIN B-12) 5000 MCG SUBL Place 1 tablet under the tongue. For nerve pain in feet and metabolism support      . cyclobenzaprine (FLEXERIL)  5 MG tablet Take 5 mg by mouth 3 (three) times daily as needed. Muscle spasms      . diclofenac (VOLTAREN) 75 MG EC tablet       . diphenhydrAMINE (BENADRYL) 25 mg capsule Take 25 mg by mouth every 6 (six) hours as needed. For itching      . esomeprazole (NEXIUM) 40 MG capsule Take 40 mg by mouth daily before breakfast.      . estradiol (ESTRACE) 1 MG tablet Take 1 mg by mouth daily.      Marland Kitchen gabapentin (NEURONTIN) 300 MG capsule Take 300 mg by mouth 3 (three) times daily. For nerve pain in both feet      . Guaifenesin-Codeine (IOPHEN C-NR PO) Take 15 mLs by mouth every 6 (six) hours as needed. For cough      . ipratropium (ATROVENT) 0.06 % nasal spray       . levothyroxine (SYNTHROID, LEVOTHROID) 112 MCG tablet Take 112 mcg by mouth every morning.      Marland Kitchen LORazepam (ATIVAN) 2 MG tablet Take 2 mg by mouth at bedtime as needed. For restful sleep      . methocarbamol (ROBAXIN) 750 MG tablet Take 750 mg by mouth 3 (three) times daily.      Marland Kitchen  montelukast (SINGULAIR) 10 MG tablet Take 10 mg by mouth daily before breakfast.       . omeprazole (PRILOSEC) 40 MG capsule       . OVER THE COUNTER MEDICATION Take 1 tablet by mouth every 4 (four) hours as needed. For pain. [employer]S Backache Relief Extra Strength.      . oxyCODONE-acetaminophen (PERCOCET/ROXICET) 5-325 MG per tablet Take 1-2 tablets by mouth every 4 (four) hours as needed.  60 tablet  0  . predniSONE (DELTASONE) 10 MG tablet Take 10 mg by mouth daily.       . promethazine (PHENERGAN) 25 MG tablet Take 25 mg by mouth every 8 (eight) hours as needed. For nasuea and vomiting      . Simethicone (GAS-X PO) Take 1 tablet by mouth daily as needed. For gas       No current facility-administered medications for this visit.    Allergies:    Allergies  Allergen Reactions  . Aleve [Naproxen] Other (See Comments)    Stomach ache  . Bextra [Valdecoxib] Other (See Comments)    Stomach ache   . Biaxin [Clarithromycin] Other (See Comments)    Dizzy, blurred  vision, achy stomach  . Doxycycline Other (See Comments)    Stomach ache  . Durabac [Apap-Salicyl-Phenyltolox-Caff] Nausea And Vomiting  . Estradiol Other (See Comments)    Hurt stomach  . Levaquin [Levofloxacin] Other (See Comments)    Dizzy, achy stomach, can't sleep  . Macrobid [Nitrofurantoin]   . Motrin [Ibuprofen] Other (See Comments)    Stomach ahce  . Oxaprozin Other (See Comments)    Hurt stomach  . Robaxin [Methocarbamol] Other (See Comments)    Hurt stomach  . Sulfa Antibiotics Other (See Comments)    Blister on large toe  . Topamax [Topiramate] Nausea And Vomiting  . Trazodone And Nefazodone Other (See Comments)    Weakness in legs, numbness in arms, funny feeling, rapid heart beat, loose focus    Social History:  The patient  reports that she has never smoked. She has never used smokeless tobacco. She reports that she does not drink alcohol or use illicit drugs.   Family History:  The patient's family history is not on file.   ROS:  Please see the history of present illness.  No nausea, vomiting.  No fevers, chills.  No focal weakness.  No dysuria.  All other systems reviewed and negative.   PHYSICAL EXAM: VS:  BP 110/70  Pulse 64  Ht 5\' 7"  (1.702 m)  Wt 224 lb (101.606 kg)  BMI 35.08 kg/m2 Well nourished, well developed, in no acute distress HEENT: normal Neck: no JVD, no carotid bruits Cardiac:  normal S1, S2; RRR;  Lungs:  clear to auscultation bilaterally, no wheezing, rhonchi or rales Abd: soft, nontender, no hepatomegaly Ext: no edema Skin: warm and dry Neuro:   no focal abnormalities noted  EKG:  Normal sinus rhythm with occasional PACs. No significant ST segment changes     ASSESSMENT AND PLAN:  Atrial fibrillation  Refill Atenolol Tablet, 50 MG, 1 tablet, Orally, Once a day, 90 days, 90, Refills 3 No recurrent symptoms of atrial fibrillation recently.  Continue aspirin. I reviewed her lab work from earlier in 2015. Her thyroid, lipids, kidney  and liver tests were normal.  Dysphagia: She reports some difficulty swallowing solid foods and having to drink liquids when eating certain foods.  I asked her to Followup with Dr. Eula ListenHussain. She may need some kind of barium swallow study.  Signed, Fredric Mare, MD, [facility] 08/28/2013 12:07 PM

## 2013-09-25 ENCOUNTER — Other Ambulatory Visit: Payer: Self-pay | Admitting: *Deleted

## 2013-09-25 MED ORDER — ATENOLOL 50 MG PO TABS: 50.0000 mg | ORAL_TABLET | Freq: Every day | ORAL | Status: DC

## 2013-10-01 ENCOUNTER — Encounter: Payer: Self-pay | Admitting: Interventional Cardiology

## 2013-10-01 ENCOUNTER — Ambulatory Visit (INDEPENDENT_AMBULATORY_CARE_PROVIDER_SITE_OTHER): Payer: Medicare Other | Admitting: Interventional Cardiology

## 2013-10-01 ENCOUNTER — Telehealth: Payer: Self-pay | Admitting: Cardiology

## 2013-10-01 ENCOUNTER — Encounter (INDEPENDENT_AMBULATORY_CARE_PROVIDER_SITE_OTHER): Payer: Self-pay

## 2013-10-01 VITALS — BP 110/70 | HR 72 | Ht 67.0 in | Wt 223.0 lb

## 2013-10-01 DIAGNOSIS — R0602 Shortness of breath: Secondary | ICD-10-CM

## 2013-10-01 DIAGNOSIS — R609 Edema, unspecified: Secondary | ICD-10-CM

## 2013-10-01 DIAGNOSIS — I4891 Unspecified atrial fibrillation: Secondary | ICD-10-CM

## 2013-10-01 MED ORDER — FUROSEMIDE 20 MG PO TABS: 20.0000 mg | ORAL_TABLET | Freq: Every day | ORAL | Status: DC

## 2013-10-01 MED ORDER — ASPIRIN EC 325 MG PO TBEC: 325.0000 mg | DELAYED_RELEASE_TABLET | Freq: Every day | ORAL | Status: DC

## 2013-10-01 MED ORDER — POTASSIUM CHLORIDE ER 8 MEQ PO TBCR: 8.0000 meq | EXTENDED_RELEASE_TABLET | Freq: Every day | ORAL | Status: DC

## 2013-10-01 NOTE — Telephone Encounter (Signed)
Pt request to be seen. Appt made for 1:30 pm today.

## 2013-10-01 NOTE — Telephone Encounter (Signed)
Pt called stating she had to go to the ED on Monday because she had difficulty breathing. She called 911 and they brought her to [facility]. They did all kinds of test and they said that pt didn't have any blood clots. Pt had some LE edema. Pt started taking Klor-Con 8 MEQ 1 tablet and furosemide 20 mg 1 tablet when she got home on Monday night. The ER did not tell her to do this; they just told her she needed to f/u with her cardiologist. Pt feels okay today and the fluid retention is a little better. The SOB is better, but it is still worse at night.

## 2013-10-01 NOTE — Patient Instructions (Addendum)
Your physician has recommended you make the following change in your medication:   1. Start taking lasix 20 mg daily.  2. Start taking Klor-Con 8 MEQ 1 tablet daily.   3. Start taking Aspirin 325 mg 1 tablet daily.   Your physician recommends that you return for lab work in: 1 week for BMET and BNP on 10/08/13  Your physician has requested that you have an echocardiogram. Echocardiography is a painless test that uses sound waves to create images of your heart. It provides your doctor with information about the size and shape of your heart and how well your heart's chambers and valves are working. This procedure takes approximately one hour. There are no restrictions for this procedure.  Your physician recommends that follow up as scheduled (April of 2016). You will receive a letter in the mail 2 months before you are due for appointment.

## 2013-10-01 NOTE — Progress Notes (Signed)
Patient ID: Allison [name], female   DOB: 1950/11/04, 63 y.o.   MRN: [id number] Patient ID: Allison [name], female   DOB: March 12, 1951, 63 y.o.   MRN: [id number]    [address] [place], [place]  [address] Phone: [phone] Fax:  [phone]  Date:  10/01/2013   ID:  Allison [name], DOB [birth year], MRN [id number]  PCP:  [name], MD      History of Present Illness: Allison [name] is a 63 y.o. female who had atrial fibrillation associated with hyperthyroidism several years ago. She underwent a Cardiolite stress test in 2014 which was negative for ischemia. She had successful shoulder surgery in 2014. She has not had any chest discomfort or palpitations recently.    She had SHOB a few days ago and went to the ER.  She feel the [facility] is with resting and with activity.  Shefeels that she is not getting enough oxygen.  She had a postivie d-dimer but a negative chest CT for PE.  No pulmonary edema noted.  Troponins were negative.  She had leg swelling as well.  She had a neative LE u/s to r/o DVT.    She took some lasix that she has at home and this is improved since she took the lasix.  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  Worse with bending down or when she lie totally flat.    Wt Readings from Last 3 Encounters:  08/28/13 224 lb (101.606 kg)  07/01/12 210 lb (95.255 kg)  07/01/12 210 lb (95.255 kg)     Past Medical History  Diagnosis Date  . Hypertension   . GERD (gastroesophageal reflux disease)   . Arthritis     all over  . AC (acromioclavicular) joint bone spurs     spurs in shoulders with pain  . Complication of anesthesia   . PONV (postoperative nausea and vomiting)   . Hypothyroidism   . Asthma     from reflux  . Deaf   . Fibromyalgia   . Arrhythmia     Current Outpatient Prescriptions  Medication Sig Dispense Refill  . ALPRAZolam (XANAX) 0.25 MG tablet       . aspirin EC 325 MG tablet Take 1 tablet (325 mg total) by mouth 2 (two) times daily.  30  tablet  0  . atenolol (TENORMIN) 50 MG tablet Take 1 tablet (50 mg total) by mouth daily before breakfast.  90 tablet  3  . Calcium Carbonate-Vitamin D (CALTRATE 600+D) 600-400 MG-UNIT per tablet Take 1 tablet by mouth daily.      . cefUROXime (CEFTIN) 500 MG tablet       . Cyanocobalamin (VITAMIN B-12) 5000 MCG SUBL Place 1 tablet under the tongue. For nerve pain in feet and metabolism support      . cyclobenzaprine (FLEXERIL) 5 MG tablet Take 5 mg by mouth 3 (three) times daily as needed. Muscle spasms      . diclofenac (VOLTAREN) 75 MG EC tablet       . diphenhydrAMINE (BENADRYL) 25 mg capsule Take 25 mg by mouth every 6 (six) hours as needed. For itching      . esomeprazole (NEXIUM) 40 MG capsule Take 40 mg by mouth daily before breakfast.      . estradiol (ESTRACE) 1 MG tablet Take 1 mg by mouth daily.      Marland Kitchen gabapentin (NEURONTIN) 300 MG capsule Take 300 mg by mouth 3 (three) times daily. For nerve  pain in both feet      . Guaifenesin-Codeine (IOPHEN C-NR PO) Take 15 mLs by mouth every 6 (six) hours as needed. For cough      . ipratropium (ATROVENT) 0.06 % nasal spray       . levothyroxine (SYNTHROID, LEVOTHROID) 112 MCG tablet Take 112 mcg by mouth every morning.      Marland Kitchen LORazepam (ATIVAN) 2 MG tablet Take 2 mg by mouth at bedtime as needed. For restful sleep      . methocarbamol (ROBAXIN) 750 MG tablet Take 750 mg by mouth 3 (three) times daily.      . montelukast (SINGULAIR) 10 MG tablet Take 10 mg by mouth daily before breakfast.       . omeprazole (PRILOSEC) 40 MG capsule       . OVER THE COUNTER MEDICATION Take 1 tablet by mouth every 4 (four) hours as needed. For pain. CVS Backache Relief Extra Strength.      . oxyCODONE-acetaminophen (PERCOCET/ROXICET) 5-325 MG per tablet Take 1-2 tablets by mouth every 4 (four) hours as needed.  60 tablet  0  . predniSONE (DELTASONE) 10 MG tablet Take 10 mg by mouth daily.       . promethazine (PHENERGAN) 25 MG tablet Take 25 mg by mouth every 8  (eight) hours as needed. For nasuea and vomiting      . Simethicone (GAS-X PO) Take 1 tablet by mouth daily as needed. For gas       No current facility-administered medications for this visit.    Allergies:    Allergies  Allergen Reactions  . Aleve [Naproxen] Other (See Comments)    Stomach ache  . Bextra [Valdecoxib] Other (See Comments)    Stomach ache   . Biaxin [Clarithromycin] Other (See Comments)    Dizzy, blurred vision, achy stomach  . Doxycycline Other (See Comments)    Stomach ache  . Durabac [Apap-Salicyl-Phenyltolox-Caff] Nausea And Vomiting  . Estradiol Other (See Comments)    Hurt stomach  . Levaquin [Levofloxacin] Other (See Comments)    Dizzy, achy stomach, can't sleep  . Macrobid [Nitrofurantoin]   . Motrin [Ibuprofen] Other (See Comments)    Stomach ahce  . Oxaprozin Other (See Comments)    Hurt stomach  . Robaxin [Methocarbamol] Other (See Comments)    Hurt stomach  . Sulfa Antibiotics Other (See Comments)    Blister on large toe  . Topamax [Topiramate] Nausea And Vomiting  . Trazodone And Nefazodone Other (See Comments)    Weakness in legs, numbness in arms, funny feeling, rapid heart beat, loose focus    Social History:  The patient  reports that she has never smoked. She has never used smokeless tobacco. She reports that she does not drink alcohol or use illicit drugs.   Family History:  The patient's family history includes Lung cancer in her father.   ROS:  Please see the history of present illness.  No nausea, vomiting.  No fevers, chills.  No focal weakness.  No dysuria.  All other systems reviewed and negative.   PHYSICAL EXAM: VS:  There were no vitals taken for this visit. Well nourished, well developed, in no acute distress HEENT: normal Neck: no JVD, no carotid bruits Cardiac:  normal S1, S2; RRR;  Lungs:  clear to auscultation bilaterally, no wheezing, rhonchi or rales Abd: soft, nontender, no hepatomegaly Ext: no edema Skin: warm  and dry Neuro:   no focal abnormalities noted  EKG:  Normal sinus rhythm with occasional  PACs. No significant ST segment changes     ASSESSMENT AND PLAN:  Atrial fibrillation  Refilled Atenolol Tablet, 50 MG, 1 tablet, Orally, Once a day, 90 days, 90, Refills 3 No recurrent symptoms of atrial fibrillation recently.  Continue aspirin. I reviewed her lab work from earlier in 2015. Her thyroid, lipids, kidney and liver tests were normal.  Shortness of breath: Extensive cardiac workup was negative. She did have air trapping suggestive of small airways disease noted on chest CT. Have asked her followup Dr. Eula ListenHussain regarding this. She may benefit from an inhaler.  Will check BNP as well and a week due to shortness of breath.  Lower extremity edema: I asked her to elevate her legs. She started taking Lasix 20 mg daily to help with the discomfort from the swelling. I recommended that she try to manage this without medication but she feels that it is too painful. Given that he has pain, we will prescribe Lasix 20 mg daily. He'll take supplemental potassium. We will check a bmet in a week.   Signed, Fredric MareJay S. Indi Willhite, MD, [facility] 10/01/2013 1:44 PM

## 2013-10-08 ENCOUNTER — Other Ambulatory Visit (INDEPENDENT_AMBULATORY_CARE_PROVIDER_SITE_OTHER): Payer: Medicare Other

## 2013-10-08 DIAGNOSIS — R0602 Shortness of breath: Secondary | ICD-10-CM

## 2013-10-08 LAB — BASIC METABOLIC PANEL
BUN: 12 mg/dL (ref 6–23)
CHLORIDE: 101 meq/L (ref 96–112)
CO2: 31 meq/L (ref 19–32)
CREATININE: 1.3 mg/dL — AB (ref 0.4–1.2)
Calcium: 9.6 mg/dL (ref 8.4–10.5)
GFR: 42.59 mL/min — ABNORMAL LOW (ref >60.00)
Glucose, Bld: 101 mg/dL — ABNORMAL HIGH (ref 70–99)
POTASSIUM: 4 meq/L (ref 3.5–5.1)
Sodium: 139 mEq/L (ref 135–145)

## 2013-10-08 LAB — BRAIN NATRIURETIC PEPTIDE: PRO B NATRI PEPTIDE: 23 pg/mL (ref 0.0–100.0)

## 2013-10-09 ENCOUNTER — Telehealth: Payer: Self-pay | Admitting: Cardiology

## 2013-10-09 MED ORDER — FUROSEMIDE 20 MG PO TABS: ORAL_TABLET | ORAL | Status: DC

## 2013-10-09 NOTE — Telephone Encounter (Signed)
meds updated.

## 2013-10-09 NOTE — Telephone Encounter (Signed)
Message copied by Theda Sers on Thu Oct 09, 2013  3:19 PM ------      Message from: Corky Crafts      Created: Thu Oct 09, 2013  2:54 PM       Reduce Lasix to 20 mg on 5 days a week.  Drink plenty of water.  No excess fluid. ------

## 2013-10-22 ENCOUNTER — Ambulatory Visit (HOSPITAL_COMMUNITY): Payer: Medicare Other | Attending: Interventional Cardiology | Admitting: Radiology

## 2013-10-22 DIAGNOSIS — I359 Nonrheumatic aortic valve disorder, unspecified: Secondary | ICD-10-CM | POA: Insufficient documentation

## 2013-10-22 DIAGNOSIS — I4891 Unspecified atrial fibrillation: Secondary | ICD-10-CM | POA: Insufficient documentation

## 2013-10-22 DIAGNOSIS — R0602 Shortness of breath: Secondary | ICD-10-CM

## 2013-10-22 DIAGNOSIS — R609 Edema, unspecified: Secondary | ICD-10-CM

## 2013-10-22 DIAGNOSIS — I10 Essential (primary) hypertension: Secondary | ICD-10-CM | POA: Insufficient documentation

## 2013-10-22 NOTE — Progress Notes (Signed)
Echocardiogram performed.  

## 2013-11-19 ENCOUNTER — Ambulatory Visit (INDEPENDENT_AMBULATORY_CARE_PROVIDER_SITE_OTHER): Payer: Medicare Other | Admitting: Internal Medicine

## 2013-11-19 ENCOUNTER — Encounter: Payer: Self-pay | Admitting: Internal Medicine

## 2013-11-19 VITALS — BP 130/80 | HR 60 | Ht 67.5 in | Wt 213.0 lb

## 2013-11-19 DIAGNOSIS — R0609 Other forms of dyspnea: Secondary | ICD-10-CM

## 2013-11-19 DIAGNOSIS — R0600 Dyspnea, unspecified: Secondary | ICD-10-CM

## 2013-11-19 DIAGNOSIS — R0989 Other specified symptoms and signs involving the circulatory and respiratory systems: Secondary | ICD-10-CM

## 2013-11-19 NOTE — Progress Notes (Signed)
Subjective:    Patient ID: Allison [name], female    DOB: 1950-09-06  MRN: [id number]  HPI  45 yowf never smoker with h/o nasal allergies x decades on singulair  never respiratory problems at all until April 2015 and doe ever since with neg cardiac w/u so referred to pulmonary 11/19/2013 by Dr Eula Listen  11/19/2013 1st [facility] office visit/ Janna Oak c Chief Complaint  Patient presents with  . Pulmonary Consult    Referred by Dr. Karl Luke for SOB with exertion  extremely difficult hx despite interpreter 1) doe is reproducible, never changes since onset, can't walk one aisle at [facility] s sob/ s ex cp- had only one episode at hs x 35m 2) assoc with nasal congestion but no cough or wheeze 3) assoc with severe dyshphagia / choking on food despite ppi rx   No obvious other patterns in day to day or daytime variabilty or assoc chronic cough or cp or chest tightness, subjective wheeze overt sinus or hb symptoms. No unusual exp hx or h/o childhood pna/ asthma or knowledge of premature birth.  Sleeping ok without nocturnal  or early am exacerbation  of respiratory  c/o's or need for noct saba. Also denies any obvious fluctuation of symptoms with weather or environmental changes or other aggravating or alleviating factors except as outlined above   Current Medications, Allergies, Complete Past Medical History, Past Surgical History, Family History, and Social History were reviewed in [employer] record.           Review of Systems  Constitutional: Positive for appetite change and unexpected weight change. Negative for fever.  HENT: Positive for congestion, sinus pressure, sore throat and trouble swallowing. Negative for dental problem, ear pain, nosebleeds, postnasal drip, rhinorrhea and sneezing.   Eyes: Negative for redness and itching.  Respiratory: Positive for shortness of breath. Negative for cough, chest tightness and wheezing.   Cardiovascular: Positive for leg  swelling. Negative for palpitations.       Feet swelling  Gastrointestinal: Positive for abdominal distention. Negative for nausea and vomiting.  Genitourinary: Negative for dysuria.  Musculoskeletal: Negative for joint swelling.  Skin: Negative for rash.  Neurological: Negative for headaches.  Hematological: Does not bruise/bleed easily.  Psychiatric/Behavioral: Negative for dysphoric mood. The patient is not nervous/anxious.        Objective:   Physical Exam  Wt Readings from Last 3 Encounters:  11/19/13 213 lb (96.616 kg)  10/01/13 223 lb (101.152 kg)  08/28/13 224 lb (101.606 kg)      HEENT: nl dentition, turbinates(no evidence of any obst/edema), and orophanx. Nl external ear canals without cough reflex   NECK :  without JVD/Nodes/TM/ nl carotid upstrokes bilaterally   LUNGS: no acc muscle use, clear to A and P bilaterally without cough on insp or exp maneuvers   CV:  RRR  no s3 or murmur or increase in P2, no edema   ABD:  soft and nontender with nl excursion in the supine position. No bruits or organomegaly, bowel sounds nl  MS:  warm without deformities, calf tenderness, cyanosis or clubbing  SKIN: warm and dry without lesions    NEURO:  alert, approp, no deficits  09/29/13 CTa non specific "small airways changes:"/ no PE or ILD    Lab Results  Component Value Date   WBC 12.1* 07/03/2012   HGB 11.6* 07/03/2012   HCT 34.4* 07/03/2012   MCV 79.4 07/03/2012   PLT 245 07/03/2012  Chemistry      Component Value Date/Time   NA 139 10/08/2013 1300   K 4.0 10/08/2013 1300   CL 101 10/08/2013 1300   CO2 31 10/08/2013 1300   BUN 12 10/08/2013 1300   CREATININE 1.3* 10/08/2013 1300      Component Value Date/Time   CALCIUM 9.6 10/08/2013 1300   ALKPHOS 119* 07/02/2012 0650   AST 17 07/02/2012 0650   ALT 20 07/02/2012 0650   BILITOT 0.2* 07/02/2012 0650       Lab Results  Component Value Date   PROBNP 23.0 10/08/2013         Assessment & Plan:

## 2013-11-19 NOTE — Patient Instructions (Addendum)
Prilosec (omeprazole) 40mg  Take 30- 60 min before your first and last  meals of the day until  You see Dr Donette Larry  GERD (REFLUX)  is an extremely common cause of respiratory symptoms, many times with no significant heartburn at all.    It can be treated with medication, but also with lifestyle changes including avoidance of late meals, excessive alcohol, smoking cessation, and avoid fatty foods, chocolate, peppermint, colas, red wine, and acidic juices such as orange juice.  NO MINT OR MENTHOL PRODUCTS SO NO COUGH DROPS  USE SUGARLESS CANDY INSTEAD (jolley ranchers or Stover's)  NO OIL BASED VITAMINS - use powdered substitutes.    No evidence of a lung problem so we will see you back here as needed

## 2013-11-19 NOTE — Assessment & Plan Note (Addendum)
11/19/2013  Walked RA  2 laps @ 185 ft each stopped due to  Sob with breath holding with nose clogged up, unsteady on feet, no desat or tachycardia   Symptoms are markedly disproportionate to objective findings and not clear this is a lung problem but pt does appear to have difficult airway management issues. DDX of  difficult airways management all start with A and  include Adherence, Ace Inhibitors, Acid Reflux, Active Sinus Disease, Alpha 1 Antitripsin deficiency, Anxiety masquerading as Airways dz,  ABPA,  allergy(esp in young), Aspiration (esp in elderly), Adverse effects of DPI,  Active smokers, plus two Bs  = Bronchiectasis and Beta blocker use..and one C= CHF  ? Anxiety > usually a dx of exclusion but much higher here> would make sure TSH has normalized on present rx > defer to Dr Eula Listen  ? Acid (or non-acid) GERD > always difficult to exclude as up to 75% of pts in some series report no assoc GI/ Heartburn symptoms and see def reports dysphagia also > rec max (24h)  acid suppression and diet restrictions/ reviewed and instructions given in writing > consider GI eval next   ? Active sinus dz > consider sinus cT/ trial of afrin to see if helps any of her ex symptoms    I see no evidence at all of a pulmonary problem here but would certainly be happy to see her in the future for any reproducible doe

## 2014-04-15 ENCOUNTER — Other Ambulatory Visit: Payer: Self-pay | Admitting: Interventional Cardiology

## 2014-06-17 ENCOUNTER — Other Ambulatory Visit: Payer: Self-pay | Admitting: *Deleted

## 2014-06-17 MED ORDER — ATENOLOL 50 MG PO TABS: 50.0000 mg | ORAL_TABLET | Freq: Every day | ORAL | Status: DC

## 2014-08-05 ENCOUNTER — Other Ambulatory Visit: Payer: Self-pay | Admitting: Interventional Cardiology

## 2014-08-10 ENCOUNTER — Other Ambulatory Visit: Payer: Self-pay | Admitting: Internal Medicine

## 2014-08-10 DIAGNOSIS — E039 Hypothyroidism, unspecified: Secondary | ICD-10-CM

## 2014-08-10 DIAGNOSIS — Z1231 Encounter for screening mammogram for malignant neoplasm of breast: Secondary | ICD-10-CM

## 2014-08-25 ENCOUNTER — Ambulatory Visit
Admission: RE | Admit: 2014-08-25 | Discharge: 2014-08-25 | Disposition: A | Discharge status: Home / Self Care | Payer: Medicare Other | Source: Ambulatory Visit | Attending: Internal Medicine | Admitting: Internal Medicine

## 2014-08-25 DIAGNOSIS — E039 Hypothyroidism, unspecified: Secondary | ICD-10-CM

## 2014-08-25 DIAGNOSIS — Z1231 Encounter for screening mammogram for malignant neoplasm of breast: Secondary | ICD-10-CM

## 2014-09-17 ENCOUNTER — Encounter: Payer: Self-pay | Admitting: Interventional Cardiology

## 2014-09-17 ENCOUNTER — Ambulatory Visit (INDEPENDENT_AMBULATORY_CARE_PROVIDER_SITE_OTHER): Payer: Medicare Other | Admitting: Interventional Cardiology

## 2014-09-17 ENCOUNTER — Other Ambulatory Visit: Payer: Self-pay | Admitting: Interventional Cardiology

## 2014-09-17 VITALS — BP 102/60 | HR 86 | Ht 67.5 in | Wt 186.1 lb

## 2014-09-17 DIAGNOSIS — I4891 Unspecified atrial fibrillation: Secondary | ICD-10-CM

## 2014-09-17 DIAGNOSIS — R0600 Dyspnea, unspecified: Secondary | ICD-10-CM | POA: Diagnosis not present

## 2014-09-17 DIAGNOSIS — E039 Hypothyroidism, unspecified: Secondary | ICD-10-CM

## 2014-09-17 DIAGNOSIS — I480 Paroxysmal atrial fibrillation: Secondary | ICD-10-CM

## 2014-09-17 MED ORDER — APIXABAN 5 MG PO TABS: 5.0000 mg | ORAL_TABLET | Freq: Two times a day (BID) | ORAL | Status: DC

## 2014-09-17 NOTE — Patient Instructions (Addendum)
Medication Instructions:  Your physician has recommended you make the following change in your medication:  1) START Eliquis 5mg  Twice Daily 2) STOP Aspirin  Labwork: Your physician recommends that you return for lab work on 10/12/14   Testing/Procedures: Your physician has recommended that you have a Cardioversion (DCCV). Electrical Cardioversion uses a jolt of electricity to your heart either through paddles or wired patches attached to your chest. This is a controlled, usually prescheduled, procedure. Defibrillation is done under light anesthesia in the hospital, and you usually go home the day of the procedure. This is done to get your heart back into a normal rhythm. You are not awake for the procedure. Please see the instruction sheet given to you today.   Follow-Up: Your physician recommends that you schedule a follow-up appointment in: 6 weeks    Any Other Special Instructions Will Be Listed Below (If Applicable).

## 2014-09-17 NOTE — Progress Notes (Signed)
Patient ID: Allison [name], female   DOB: 01/29/1951, 64 y.o.   MRN: [id number]     Cardiology Office Note   Date:  09/17/2014   ID:  Aishah R Stockley, DOB 07/25/1950, MRN [id number]  PCP:  HUSAIN,[name], MD    No chief complaint on file.    Wt Readings from Last 3 Encounters:  09/17/14 186 lb 1.9 oz (84.423 kg)  11/19/13 213 lb (96.616 kg)  10/01/13 223 lb (101.152 kg)       History of Present Illness: Allison [name] is a 64 y.o. female   who had atrial fibrillation associated with hyperthyroidism several years ago. She underwent a Cardiolite stress test in 2014 which was negative for ischemia. She had successful shoulder surgery in 2014. She has not had any chest discomfort or palpitations recently.   SHOB is still worse when she vacuums.   She does not walk for long distances due to foot pain.  She does not use a pool.  She has a bike at home but does not use it.    Past Medical History  Diagnosis Date  . Hypertension   . GERD (gastroesophageal reflux disease)   . Arthritis     all over  . AC (acromioclavicular) joint bone spurs     spurs in shoulders with pain  . Complication of anesthesia   . PONV (postoperative nausea and vomiting)   . Hypothyroidism   . Asthma     from reflux  . Deaf   . Fibromyalgia   . Arrhythmia     Past Surgical History  Procedure Laterality Date  . Righr shoulder surgery  1999    spurs  . Wisdom tooth extraction    . Tonsillectomy    . Tubal ligation    . Thyroid surgery  1991  . Vericose surgery right leg  05-1998  . Eye surgery      catarcts  . Esophagogastroduodenoscopy endoscopy      several times  . Colonoscopy  2004,1999  . Colonoscopy with propofol  04/09/2012    Procedure: COLONOSCOPY WITH PROPOFOL;  Surgeon: Martin K Johnson, MD;  Location: [facility];  Service: Endoscopy;  Laterality: N/A;  . Abdominal hysterectomy      partial  . Total shoulder arthroplasty Right 07/02/2012    Procedure: TOTAL SHOULDER  ARTHROPLASTY;  Surgeon: Justin William Chandler, MD;  Location: [facility] OR;  Service: Orthopedics;  Laterality: Right;  Right total shoulder arthroplasty     Current Outpatient Prescriptions  Medication Sig Dispense Refill  . ALPRAZolam (XANAX) 0.25 MG tablet Take 0.25 mg by mouth at bedtime.     . aspirin EC 325 MG tablet Take 1 tablet (325 mg total) by mouth daily. 30 tablet 0  . atenolol (TENORMIN) 50 MG tablet Take 1 tablet by mouth  daily before breakfast 90 tablet 0  . Calcium Carbonate-Vitamin D (CALTRATE 600+D) 600-400 MG-UNIT per tablet Take 1 tablet by mouth daily.    . Cyanocobalamin (VITAMIN B-12) 5000 MCG SUBL Place 1 tablet under the tongue. For nerve pain in feet and metabolism support    . cyclobenzaprine (FLEXERIL) 5 MG tablet Take 5 mg by mouth 3 (three) times daily as needed. Muscle spasms    . diclofenac (VOLTAREN) 75 MG EC tablet Take 75 mg by mouth 2 (two) times daily.     . diphenhydrAMINE (BENADRYL) 25 mg capsule Take 25 mg by mouth every 6 (six) hours as needed. For itching    . furosemide (LASIX) 20   MG tablet TAKE 1 TABLET (20 MG TOTAL) BY MOUTH DAILY. 30 tablet 6  . gabapentin (NEURONTIN) 600 MG tablet Take 600 mg by mouth 3 (three) times daily.  6  . ipratropium (ATROVENT) 0.06 % nasal spray     . KLOR-CON 8 MEQ tablet TAKE 1 TABLET (8 MEQ TOTAL) BY MOUTH DAILY. 30 tablet 6  . levothyroxine (SYNTHROID, LEVOTHROID) 125 MCG tablet Take 125 mcg by mouth daily before breakfast.    . LORazepam (ATIVAN) 2 MG tablet Take 2 mg by mouth at bedtime as needed. For restful sleep    . montelukast (SINGULAIR) 10 MG tablet Take 10 mg by mouth daily before breakfast.     . omeprazole (PRILOSEC) 40 MG capsule     . OVER THE COUNTER MEDICATION Take 1 tablet by mouth every 4 (four) hours as needed. For pain. [employer] Backache Relief Extra Strength.    . oxyCODONE-acetaminophen (PERCOCET/ROXICET) 5-325 MG per tablet Take 1-2 tablets by mouth every 4 (four) hours as needed. 60 tablet 0  .  promethazine (PHENERGAN) 25 MG tablet Take 25 mg by mouth every 8 (eight) hours as needed. For nasuea and vomiting    . Simethicone (GAS-X PO) Take 1 tablet by mouth daily as needed. For gas     No current facility-administered medications for this visit.    Allergies:   Aleve; Bextra; Biaxin; Doxycycline; Durabac; Estradiol; Levaquin; Macrobid; Motrin; Oxaprozin; Robaxin; Sulfa antibiotics; Topamax; and Trazodone and nefazodone    Social History:  The patient  reports that she has never smoked. She has never used smokeless tobacco. She reports that she does not drink alcohol or use illicit drugs.   Family History:  The patient's *family history includes Arrhythmia in her father and mother; Lung cancer in her father.    ROS:  Please see the history of present illness.   Otherwise, review of systems are positive for SHOB.   All other systems are reviewed and negative.    PHYSICAL EXAM: VS:  BP 102/60 mmHg  Pulse 86  Ht 5' 7.5" (1.715 m)  Wt 186 lb 1.9 oz (84.423 kg)  BMI 28.70 kg/m2 , BMI Body mass index is 28.7 kg/(m^2). GEN: Well nourished, well developed, in no acute distress HEENT: normal Neck: no JVD, carotid bruits, or masses Cardiac: irregularly irregularly, tachycardia; no murmurs, rubs, or gallops,no edema  Respiratory:  clear to auscultation bilaterally, normal work of breathing GI: soft, nontender, nondistended, + BS MS: no deformity or atrophy Skin: warm and dry, no rash Neuro:  Strength and sensation are intact Psych: euthymic mood, full affect   EKG:   The ekg ordered today demonstrates atrial fibrillation with controlled ventricular response   Recent Labs: 10/08/2013: BUN 12; Creatinine 1.3*; Potassium 4.0; Pro B Natriuretic peptide (BNP) 23.0; Sodium 139   Lipid Panel No results found for: CHOL, TRIG, HDL, CHOLHDL, VLDL, LDLCALC, LDLDIRECT   Other studies Reviewed: Additional studies/ records that were reviewed today with results demonstrating: Normal LV  function by most recent echocardiogram in June 2015.   ASSESSMENT AND PLAN:  AFib: Se is back in AFib.  Rate controlled.  Only taking aspirin. This patients CHA2DS2-VASc Score and unadjusted Ischemic Stroke Rate (% per year) is equal to 2.2 % stroke rate/year from a score of 2  Above score calculated as 1 point each if present [CHF, HTN, DM, Vascular=MI/PAD/Aortic Plaque, Age if 65-74, or Female] Above score calculated as 2 points each if present [Age > 75, or Stroke/TIA/TE] We had a long   discussion about anticoagulation. She is agreeable to try Eliquis. We'll plan for cardioversion in 4 weeks. The procedure was explained to her. She is in agreement. All questions were answered. Her father has atrial fibrillation and has been on blood thinners and has had a cardioversion in the past.  Stop aspirin since she will be taking Eliquis.  I think there is a chance she will convert on her own back to normal sinus rhythm.  Hypothyroidism: She states that her thyroid was checked recently and no changes were made.  Shortness of breath likely multifactorial. May be related to atrial fibrillation as well as some deconditioning.    Current medicines are reviewed at length with the patient today.  The patient concerns regarding her medicines were addressed.  The following changes have been made:  No change  Labs/ tests ordered today include:  No orders of the defined types were placed in this encounter.    Recommend 150 minutes/week of aerobic exercise Low fat, low carb, high fiber diet recommended  Disposition:   FU in 6 weeks   Signed, Tiearra Colwell S., MD  09/17/2014 11:51 AM    [facility] [address], [place], [place]  [address] Phone: [phone]; Fax: [phone]     

## 2014-09-22 ENCOUNTER — Telehealth: Payer: Self-pay

## 2014-09-22 NOTE — Telephone Encounter (Signed)
Prior auth sent to [facility] for Eliquis 5mg  bid .

## 2014-09-22 NOTE — Telephone Encounter (Signed)
Prior auth sent to [facility] for Eliquis 5mg 

## 2014-09-22 NOTE — Telephone Encounter (Signed)
Fax from [facility] with approval for Eliquis thru 06/24/2015. [id number].

## 2014-09-25 ENCOUNTER — Telehealth: Payer: Self-pay | Admitting: Interventional Cardiology

## 2014-09-25 NOTE — Telephone Encounter (Signed)
Lm w/telephone interrupter to call back.  Pt is deaf.

## 2014-09-25 NOTE — Telephone Encounter (Signed)
Calling back thru interrupter.  Wanted to know what EKG showed when she was here on 5/19.  Advised she has atrial fibrillation and that is why she is scheduled for a cardioversion on 6/17 at [facility].  She did not receive letter so will mail another copy with instructions.  Advised to come to office on 6/13 to get blood work. She verbalizes understanding and is appreciative of talking with her and giving understanding what atrial fibrillation means.

## 2014-09-25 NOTE — Telephone Encounter (Signed)
New Message   Patient would like to know about her EKG. Please give patient a call back. (Patient is deaf)

## 2014-10-12 ENCOUNTER — Other Ambulatory Visit (INDEPENDENT_AMBULATORY_CARE_PROVIDER_SITE_OTHER): Payer: Medicare Other | Admitting: *Deleted

## 2014-10-12 DIAGNOSIS — I4891 Unspecified atrial fibrillation: Secondary | ICD-10-CM

## 2014-10-12 LAB — BASIC METABOLIC PANEL
BUN: 11 mg/dL (ref 6–23)
CO2: 28 mEq/L (ref 19–32)
Calcium: 9.4 mg/dL (ref 8.4–10.5)
Chloride: 103 mEq/L (ref 96–112)
Creatinine, Ser: 1.1 mg/dL (ref 0.40–1.20)
GFR: 52.86 mL/min — ABNORMAL LOW (ref >60.00)
GLUCOSE: 104 mg/dL — AB (ref 70–99)
POTASSIUM: 4.1 meq/L (ref 3.5–5.1)
Sodium: 138 mEq/L (ref 135–145)

## 2014-10-16 ENCOUNTER — Encounter (HOSPITAL_COMMUNITY)
Admission: RE | Disposition: A | Discharge status: Home / Self Care | Payer: Self-pay | Source: Ambulatory Visit | Attending: Interventional Cardiology

## 2014-10-16 ENCOUNTER — Ambulatory Visit (HOSPITAL_COMMUNITY)
Admission: RE | Admit: 2014-10-16 | Discharge: 2014-10-16 | Disposition: A | Discharge status: Home / Self Care | Payer: Medicare Other | Source: Ambulatory Visit | Attending: Interventional Cardiology | Admitting: Interventional Cardiology

## 2014-10-16 ENCOUNTER — Ambulatory Visit (HOSPITAL_COMMUNITY): Payer: Medicare Other | Admitting: Anesthesiology

## 2014-10-16 ENCOUNTER — Encounter (HOSPITAL_COMMUNITY): Payer: Self-pay

## 2014-10-16 DIAGNOSIS — M1990 Unspecified osteoarthritis, unspecified site: Secondary | ICD-10-CM | POA: Diagnosis not present

## 2014-10-16 DIAGNOSIS — J45909 Unspecified asthma, uncomplicated: Secondary | ICD-10-CM | POA: Insufficient documentation

## 2014-10-16 DIAGNOSIS — Z96611 Presence of right artificial shoulder joint: Secondary | ICD-10-CM | POA: Diagnosis not present

## 2014-10-16 DIAGNOSIS — I480 Paroxysmal atrial fibrillation: Secondary | ICD-10-CM | POA: Diagnosis not present

## 2014-10-16 DIAGNOSIS — I10 Essential (primary) hypertension: Secondary | ICD-10-CM | POA: Diagnosis not present

## 2014-10-16 DIAGNOSIS — Z7982 Long term (current) use of aspirin: Secondary | ICD-10-CM | POA: Insufficient documentation

## 2014-10-16 DIAGNOSIS — Z791 Long term (current) use of non-steroidal anti-inflammatories (NSAID): Secondary | ICD-10-CM | POA: Insufficient documentation

## 2014-10-16 DIAGNOSIS — E890 Postprocedural hypothyroidism: Secondary | ICD-10-CM | POA: Diagnosis not present

## 2014-10-16 DIAGNOSIS — R9431 Abnormal electrocardiogram [ECG] [EKG]: Secondary | ICD-10-CM | POA: Diagnosis not present

## 2014-10-16 DIAGNOSIS — H9190 Unspecified hearing loss, unspecified ear: Secondary | ICD-10-CM | POA: Diagnosis not present

## 2014-10-16 DIAGNOSIS — K219 Gastro-esophageal reflux disease without esophagitis: Secondary | ICD-10-CM | POA: Diagnosis not present

## 2014-10-16 DIAGNOSIS — Z79891 Long term (current) use of opiate analgesic: Secondary | ICD-10-CM | POA: Diagnosis not present

## 2014-10-16 DIAGNOSIS — Z79899 Other long term (current) drug therapy: Secondary | ICD-10-CM | POA: Diagnosis not present

## 2014-10-16 DIAGNOSIS — I517 Cardiomegaly: Secondary | ICD-10-CM | POA: Insufficient documentation

## 2014-10-16 DIAGNOSIS — M797 Fibromyalgia: Secondary | ICD-10-CM | POA: Diagnosis not present

## 2014-10-16 DIAGNOSIS — R0602 Shortness of breath: Secondary | ICD-10-CM | POA: Insufficient documentation

## 2014-10-16 DIAGNOSIS — I4891 Unspecified atrial fibrillation: Secondary | ICD-10-CM | POA: Insufficient documentation

## 2014-10-16 DIAGNOSIS — I4819 Other persistent atrial fibrillation: Secondary | ICD-10-CM | POA: Insufficient documentation

## 2014-10-16 HISTORY — PX: CARDIOVERSION: SHX1299

## 2014-10-16 SURGERY — CARDIOVERSION
Anesthesia: General

## 2014-10-16 MED ORDER — SODIUM CHLORIDE 0.9 % IJ SOLN
3.0000 mL | INTRAMUSCULAR | Status: DC | PRN
Start: 2014-10-16 — End: 2014-10-16

## 2014-10-16 MED ORDER — SODIUM CHLORIDE 0.9 % IJ SOLN: 3.0000 mL | Freq: Two times a day (BID) | INTRAMUSCULAR | Status: DC

## 2014-10-16 MED ORDER — SODIUM CHLORIDE 0.9 % IV SOLN
INTRAVENOUS | Status: DC | PRN
  Administered 2014-10-16: 13:00:00 via INTRAVENOUS

## 2014-10-16 MED ORDER — ATENOLOL 50 MG PO TABS: 25.0000 mg | ORAL_TABLET | Freq: Every day | ORAL | Status: DC

## 2014-10-16 MED ORDER — LIDOCAINE HCL (CARDIAC) 20 MG/ML IV SOLN
INTRAVENOUS | Status: DC | PRN
  Administered 2014-10-16: 100 mg via INTRAVENOUS

## 2014-10-16 MED ORDER — HYDROCORTISONE 1 % EX CREA
1.0000 "application " | TOPICAL_CREAM | Freq: Three times a day (TID) | CUTANEOUS | Status: DC | PRN
  Filled 2014-10-16: qty 28

## 2014-10-16 MED ORDER — SODIUM CHLORIDE 0.9 % IV SOLN: 250.0000 mL | INTRAVENOUS | Status: DC

## 2014-10-16 NOTE — Transfer of Care (Signed)
Immediate Anesthesia Transfer of Care Note  Patient: Allison [name]  Procedure(s) Performed: Procedure(s): CARDIOVERSION (N/A)  Patient Location: Endoscopy Unit  Anesthesia Type:General  Level of Consciousness: awake, alert  and oriented  Airway & Oxygen Therapy: Patient Spontanous Breathing and Patient connected to nasal cannula oxygen  Post-op Assessment: Report given to RN, Post -op Vital signs reviewed and stable and Patient moving all extremities X 4  Post vital signs: Reviewed and stable  Last Vitals:  Filed Vitals:   10/16/14 1304  BP: 126/56  Pulse: 89  Temp:   Resp: 20    Complications: No apparent anesthesia complications

## 2014-10-16 NOTE — Discharge Instructions (Signed)

## 2014-10-16 NOTE — Interval H&P Note (Signed)
History and Physical Interval Note:  10/16/2014 1:10 PM  Allison [name]  has presented today for surgery, with the diagnosis of afib  The various methods of treatment have been discussed with the patient and family. After consideration of risks, benefits and other options for treatment, the patient has consented to  Procedure(s): CARDIOVERSION (N/A) as a surgical intervention .  The patient's history has been reviewed, patient examined, no change in status, stable for surgery.  I have reviewed the patient's chart and labs.  Questions were answered to the patient's satisfaction.     Rosibel Giacobbe S.

## 2014-10-16 NOTE — Anesthesia Postprocedure Evaluation (Signed)
  Anesthesia Post-op Note  Patient: Allison [name]  Procedure(s) Performed: Procedure(s): CARDIOVERSION (N/A)  Patient Location: Endoscopy Unit  Anesthesia Type:General  Level of Consciousness: awake, alert  and oriented  Airway and Oxygen Therapy: Patient Spontanous Breathing and Patient connected to nasal cannula oxygen  Post-op Pain: mild  Post-op Assessment: Post-op Vital signs reviewed, Patient's Cardiovascular Status Stable, Respiratory Function Stable, Patent Airway, No signs of Nausea or vomiting, Adequate PO intake and Pain level controlled              Post-op Vital Signs: Reviewed and stable  Last Vitals:  Filed Vitals:   10/16/14 1304  BP: 126/56  Pulse: 89  Temp:   Resp: 20    Complications: No apparent anesthesia complications

## 2014-10-16 NOTE — CV Procedure (Signed)
   Electrical Cardioversion Procedure Note Allison [name] [id number] 08/23/1950  Procedure: Electrical Cardioversion Indications:  Atrial Fibrillation  Time Out: Verified patient identification, verified procedure,medications/allergies/relevent history reviewed, required imaging and test results available.  Performed  Procedure Details  The patient was NPO after midnight. Anesthesia was administered at the beside  by Dr. Renold Don with 70 mg of propofol, 100 mg Lidocaine.  Cardioversion was done with synchronized biphasic defibrillation with AP pads with 120 J, unsuccessful.  THen repeated at 150 J with successful restoration of NSR..  The patient converted to normal sinus rhythm. The patient tolerated the procedure well  IMPRESSION:  Successful cardioversion of atrial fibrillation to NSR.  Continue Eliquis for at least a month.    VARANASI,[name]. 10/16/2014, 1:22 PM

## 2014-10-16 NOTE — Anesthesia Preprocedure Evaluation (Addendum)
Anesthesia Evaluation  Patient identified by MRN, date of birth, ID band Patient awake    Reviewed: Allergy & Precautions, NPO status , Patient's Chart, lab work & pertinent test results, reviewed documented beta blocker date and time   History of Anesthesia Complications (+) PONV and history of anesthetic complications  Airway Mallampati: II  TM Distance: >3 FB Neck ROM: Full    Dental no notable dental hx.    Pulmonary shortness of breath, asthma ,  breath sounds clear to auscultation  Pulmonary exam normal       Cardiovascular hypertension, Pt. on medications and Pt. on home beta blockers Normal cardiovascular exam+ dysrhythmias Rhythm:Regular Rate:Normal     Neuro/Psych  Neuromuscular disease negative psych ROS   GI/Hepatic Neg liver ROS, GERD-  ,  Endo/Other  Hypothyroidism   Renal/GU negative Renal ROS     Musculoskeletal  (+) Arthritis -, Fibromyalgia -  Abdominal   Peds  Hematology negative hematology ROS (+)   Anesthesia Other Findings   Reproductive/Obstetrics negative OB ROS                           Anesthesia Physical Anesthesia Plan  ASA: III  Anesthesia Plan: General   Post-op Pain Management:    Induction: Intravenous  Airway Management Planned: Mask  Additional Equipment:   Intra-op Plan:   Post-operative Plan:   Informed Consent: I have reviewed the patients History and Physical, chart, labs and discussed the procedure including the risks, benefits and alternatives for the proposed anesthesia with the patient or authorized representative who has indicated his/her understanding and acceptance.   Dental advisory given  Plan Discussed with: CRNA  Anesthesia Plan Comments:         Anesthesia Quick Evaluation                                  Anesthesia Evaluation  Patient identified by MRN, date of birth, ID band Patient awake    Reviewed: Allergy  & Precautions, H&P , NPO status , Patient's Chart, lab work & pertinent test results, reviewed documented beta blocker date and time   History of Anesthesia Complications (+) PONV  Airway Mallampati: I TM Distance: >3 FB Neck ROM: full    Dental  (+) Teeth Intact and Dental Advisory Given   Pulmonary asthma ,  breath sounds clear to auscultation        Cardiovascular hypertension, Pt. on medications and Pt. on home beta blockers Rhythm:regular Rate:Normal     Neuro/Psych    GI/Hepatic GERD-  Medicated and Controlled,  Endo/Other  Hypothyroidism   Renal/GU      Musculoskeletal  (+) Fibromyalgia -, narcotic dependent  Abdominal   Peds  Hematology   Anesthesia Other Findings   Reproductive/Obstetrics                         Anesthesia Physical Anesthesia Plan  ASA: II  Anesthesia Plan: General and Regional   Post-op Pain Management: MAC Combined w/ Regional for Post-op pain   Induction: Intravenous  Airway Management Planned: Oral ETT  Additional Equipment:   Intra-op Plan:   Post-operative Plan: Extubation in OR  Informed Consent: I have reviewed the patients History and Physical, chart, labs and discussed the procedure including the risks, benefits and alternatives for the proposed anesthesia with the patient or authorized representative who has indicated  his/her understanding and acceptance.   Dental advisory given  Plan Discussed with: CRNA, Anesthesiologist and Surgeon  Anesthesia Plan Comments:        Anesthesia Quick Evaluation                                   Anesthesia Evaluation  Patient identified by MRN, date of birth, ID band Patient awake    Reviewed: Allergy & Precautions, H&P , NPO status , Patient's Chart, lab work & pertinent test results, reviewed documented beta blocker date and time   History of Anesthesia Complications (+) PONV  Airway Mallampati: I TM Distance: >3 FB Neck ROM:  full    Dental  (+) Teeth Intact and Dental Advisory Given   Pulmonary asthma ,  breath sounds clear to auscultation        Cardiovascular hypertension, Pt. on medications and Pt. on home beta blockers Rhythm:regular Rate:Normal     Neuro/Psych    GI/Hepatic GERD-  Medicated and Controlled,  Endo/Other  Hypothyroidism   Renal/GU      Musculoskeletal  (+) Fibromyalgia -, narcotic dependent  Abdominal   Peds  Hematology   Anesthesia Other Findings   Reproductive/Obstetrics                         Anesthesia Physical Anesthesia Plan  ASA: II  Anesthesia Plan: General and Regional   Post-op Pain Management: MAC Combined w/ Regional for Post-op pain   Induction: Intravenous  Airway Management Planned: Oral ETT  Additional Equipment:   Intra-op Plan:   Post-operative Plan: Extubation in OR  Informed Consent: I have reviewed the patients History and Physical, chart, labs and discussed the procedure including the risks, benefits and alternatives for the proposed anesthesia with the patient or authorized representative who has indicated his/her understanding and acceptance.   Dental advisory given  Plan Discussed with: CRNA, Anesthesiologist and Surgeon  Anesthesia Plan Comments:        Anesthesia Quick Evaluation

## 2014-10-16 NOTE — H&P (View-Only) (Signed)
Patient ID: Allison [name], female   DOB: 1951/02/16, 64 y.o.   MRN: [id number]     Cardiology Office Note   Date:  09/17/2014   ID:  Allison [name], DOB February 19, 1951, MRN [id number]  PCP:  Allison Housekeeper, MD    No chief complaint on file.    Wt Readings from Last 3 Encounters:  09/17/14 186 lb 1.9 oz (84.423 kg)  11/19/13 213 lb (96.616 kg)  10/01/13 223 lb (101.152 kg)       History of Present Illness: Allison [name] is a 64 y.o. female   who had atrial fibrillation associated with hyperthyroidism several years ago. She underwent a Cardiolite stress test in 2014 which was negative for ischemia. She had successful shoulder surgery in 2014. She has not had any chest discomfort or palpitations recently.   SHOB is still worse when she vacuums.   She does not walk for long distances due to foot pain.  She does not use a pool.  She has a bike at home but does not use it.    Past Medical History  Diagnosis Date  . Hypertension   . GERD (gastroesophageal reflux disease)   . Arthritis     all over  . AC (acromioclavicular) joint bone spurs     spurs in shoulders with pain  . Complication of anesthesia   . PONV (postoperative nausea and vomiting)   . Hypothyroidism   . Asthma     from reflux  . Deaf   . Fibromyalgia   . Arrhythmia     Past Surgical History  Procedure Laterality Date  . Righr shoulder surgery  1999    spurs  . Wisdom tooth extraction    . Tonsillectomy    . Tubal ligation    . Thyroid surgery  1991  . Vericose surgery right leg  05-1998  . Eye surgery      catarcts  . Esophagogastroduodenoscopy endoscopy      several times  . Colonoscopy  [id number]  . Colonoscopy with propofol  04/09/2012    Procedure: COLONOSCOPY WITH PROPOFOL;  Surgeon: Allison Bumpers, MD;  Location: [facility];  Service: Endoscopy;  Laterality: N/A;  . Abdominal hysterectomy      partial  . Total shoulder arthroplasty Right 07/02/2012    Procedure: TOTAL SHOULDER  ARTHROPLASTY;  Surgeon: Allison Paris, MD;  Location: [facility];  Service: Orthopedics;  Laterality: Right;  Right total shoulder arthroplasty     Current Outpatient Prescriptions  Medication Sig Dispense Refill  . ALPRAZolam (XANAX) 0.25 MG tablet Take 0.25 mg by mouth at bedtime.     Marland Kitchen aspirin EC 325 MG tablet Take 1 tablet (325 mg total) by mouth daily. 30 tablet 0  . atenolol (TENORMIN) 50 MG tablet Take 1 tablet by mouth  daily before breakfast 90 tablet 0  . Calcium Carbonate-Vitamin D (CALTRATE 600+D) 600-400 MG-UNIT per tablet Take 1 tablet by mouth daily.    . Cyanocobalamin (VITAMIN B-12) 5000 MCG SUBL Place 1 tablet under the tongue. For nerve pain in feet and metabolism support    . cyclobenzaprine (FLEXERIL) 5 MG tablet Take 5 mg by mouth 3 (three) times daily as needed. Muscle spasms    . diclofenac (VOLTAREN) 75 MG EC tablet Take 75 mg by mouth 2 (two) times daily.     . diphenhydrAMINE (BENADRYL) 25 mg capsule Take 25 mg by mouth every 6 (six) hours as needed. For itching    . furosemide (LASIX) 20  MG tablet TAKE 1 TABLET (20 MG TOTAL) BY MOUTH DAILY. 30 tablet 6  . gabapentin (NEURONTIN) 600 MG tablet Take 600 mg by mouth 3 (three) times daily.  6  . ipratropium (ATROVENT) 0.06 % nasal spray     . KLOR-CON 8 MEQ tablet TAKE 1 TABLET (8 MEQ TOTAL) BY MOUTH DAILY. 30 tablet 6  . levothyroxine (SYNTHROID, LEVOTHROID) 125 MCG tablet Take 125 mcg by mouth daily before breakfast.    . LORazepam (ATIVAN) 2 MG tablet Take 2 mg by mouth at bedtime as needed. For restful sleep    . montelukast (SINGULAIR) 10 MG tablet Take 10 mg by mouth daily before breakfast.     . omeprazole (PRILOSEC) 40 MG capsule     . OVER THE COUNTER MEDICATION Take 1 tablet by mouth every 4 (four) hours as needed. For pain. [employer] Backache Relief Extra Strength.    . oxyCODONE-acetaminophen (PERCOCET/ROXICET) 5-325 MG per tablet Take 1-2 tablets by mouth every 4 (four) hours as needed. 60 tablet 0  .  promethazine (PHENERGAN) 25 MG tablet Take 25 mg by mouth every 8 (eight) hours as needed. For nasuea and vomiting    . Simethicone (GAS-X PO) Take 1 tablet by mouth daily as needed. For gas     No current facility-administered medications for this visit.    Allergies:   Aleve; Bextra; Biaxin; Doxycycline; Durabac; Estradiol; Levaquin; Macrobid; Motrin; Oxaprozin; Robaxin; Sulfa antibiotics; Topamax; and Trazodone and nefazodone    Social History:  The patient  reports that she has never smoked. She has never used smokeless tobacco. She reports that she does not drink alcohol or use illicit drugs.   Family History:  The patient's *family history includes Arrhythmia in her father and mother; Lung cancer in her father.    ROS:  Please see the history of present illness.   Otherwise, review of systems are positive for [facility].   All other systems are reviewed and negative.    PHYSICAL EXAM: VS:  BP 102/60 mmHg  Pulse 86  Ht 5' 7.5" (1.715 m)  Wt 186 lb 1.9 oz (84.423 kg)  BMI 28.70 kg/m2 , BMI Body mass index is 28.7 kg/(m^2). GEN: Well nourished, well developed, in no acute distress HEENT: normal Neck: no JVD, carotid bruits, or masses Cardiac: irregularly irregularly, tachycardia; no murmurs, rubs, or gallops,no edema  Respiratory:  clear to auscultation bilaterally, normal work of breathing GI: soft, nontender, nondistended, + BS MS: no deformity or atrophy Skin: warm and dry, no rash Neuro:  Strength and sensation are intact Psych: euthymic mood, full affect   EKG:   The ekg ordered today demonstrates atrial fibrillation with controlled ventricular response   Recent Labs: 10/08/2013: BUN 12; Creatinine 1.3*; Potassium 4.0; Pro B Natriuretic peptide (BNP) 23.0; Sodium 139   Lipid Panel No results found for: CHOL, TRIG, HDL, CHOLHDL, VLDL, LDLCALC, LDLDIRECT   Other studies Reviewed: Additional studies/ records that were reviewed today with results demonstrating: Normal LV  function by most recent echocardiogram in June 2015.   ASSESSMENT AND PLAN:  AFib: Se is back in AFib.  Rate controlled.  Only taking aspirin. This patients CHA2DS2-VASc Score and unadjusted Ischemic Stroke Rate (% per year) is equal to 2.2 % stroke rate/year from a score of 2  Above score calculated as 1 point each if present [CHF, HTN, DM, Vascular=MI/PAD/Aortic Plaque, Age if 65-74, or Female] Above score calculated as 2 points each if present [Age > 75, or Stroke/TIA/TE] We had a long  discussion about anticoagulation. She is agreeable to try Eliquis. We'll plan for cardioversion in 4 weeks. The procedure was explained to her. She is in agreement. All questions were answered. Her father has atrial fibrillation and has been on blood thinners and has had a cardioversion in the past.  Stop aspirin since she will be taking Eliquis.  I think there is a chance she will convert on her own back to normal sinus rhythm.  Hypothyroidism: She states that her thyroid was checked recently and no changes were made.  Shortness of breath likely multifactorial. May be related to atrial fibrillation as well as some deconditioning.    Current medicines are reviewed at length with the patient today.  The patient concerns regarding her medicines were addressed.  The following changes have been made:  No change  Labs/ tests ordered today include:  No orders of the defined types were placed in this encounter.    Recommend 150 minutes/week of aerobic exercise Low fat, low carb, high fiber diet recommended  Disposition:   FU in 6 weeks   Delorise Jackson., MD  09/17/2014 11:51 AM    [facility] [address] [place], [place], [place]  [address] Phone: [phone]; Fax: [phone]

## 2014-10-19 ENCOUNTER — Telehealth: Payer: Self-pay | Admitting: Interventional Cardiology

## 2014-10-19 ENCOUNTER — Encounter (HOSPITAL_COMMUNITY): Payer: Self-pay | Admitting: Interventional Cardiology

## 2014-10-19 NOTE — Telephone Encounter (Signed)
Left message to call back  

## 2014-10-19 NOTE — Telephone Encounter (Signed)
New Message  Pt calling to speak w/ RN about the special instructons in her AVS from last Friday 6/17 (concerning physical activity). Per pt- can leave detailed VM. Please call back and discuss.

## 2014-10-19 NOTE — Telephone Encounter (Signed)
Spoke with pt with interpreter line. Pt inquired about the portion of information on her discharge summary that spoke about heart failure.  Pt wasn't sure if that was pertaining to her or if it was just general information given.  Informed pt that there is a section that says "special instructions for pt's with heart failure" and that this section for for those pt's that have been diagnosed with heart failure. Informed pt that that she does need to avoid straining and to stop any activity that causes CP, SOB, dizziness, sweating or excessive weakness. Informed pt to let our office know if she has any of these issues occur. Per interpreter, pt understood instructions.

## 2014-10-28 ENCOUNTER — Other Ambulatory Visit: Payer: Self-pay | Admitting: Interventional Cardiology

## 2014-10-28 ENCOUNTER — Ambulatory Visit: Payer: Medicare Other | Admitting: Interventional Cardiology

## 2014-10-29 NOTE — Telephone Encounter (Signed)
Per note 4.4.16 

## 2014-11-02 ENCOUNTER — Other Ambulatory Visit: Payer: Self-pay | Admitting: Interventional Cardiology

## 2014-11-03 NOTE — Telephone Encounter (Signed)
Patient stated that she has been taking the whole 50mg  tablet, but it looks like she was to reduce the dose to 25mg  per hospital note. Please advise. Thanks, MI

## 2014-11-04 NOTE — Telephone Encounter (Signed)
Spoke with pt through interpreter service. Pt states that she was not aware that she was suppose to decrease her Atenolol to 25mg  post hospital. Pt states that she has been taking Atenolol 50mg  QD. Pt took vitals while on the phone and they were 112/58, HR 64. Pt states these values are her normal range. Informed pt that I would route this information to Dr. Eldridge Dace to see if he wanted her to stay on the 50mg  or if he wants her to decrease it to the 25mg . Pt in agreement with this plan.

## 2014-11-11 NOTE — Telephone Encounter (Signed)
OK to stay on atenolol 50 mg daily.

## 2014-11-11 NOTE — Telephone Encounter (Signed)
Left message to call back  

## 2014-11-18 NOTE — Telephone Encounter (Signed)
Late Entry:  Spoke with pt through interpreter service on 7/13 and informed her Dr. Eldridge Dace said ok to stay on Atenolol 50mg  daily. Prescription sent to pharmacy. Pt in agreement with this plan.

## 2014-11-24 ENCOUNTER — Encounter: Payer: Self-pay | Admitting: Interventional Cardiology

## 2014-11-24 ENCOUNTER — Ambulatory Visit (INDEPENDENT_AMBULATORY_CARE_PROVIDER_SITE_OTHER): Payer: Medicare Other | Admitting: Interventional Cardiology

## 2014-11-24 VITALS — BP 130/68 | HR 61 | Ht 68.0 in | Wt 187.0 lb

## 2014-11-24 DIAGNOSIS — I4891 Unspecified atrial fibrillation: Secondary | ICD-10-CM

## 2014-11-24 DIAGNOSIS — I480 Paroxysmal atrial fibrillation: Secondary | ICD-10-CM | POA: Diagnosis not present

## 2014-11-24 NOTE — Patient Instructions (Signed)
**Note De-identified  Obfuscation** Medication Instructions:  Same-no change  Labwork: None  Testing/Procedures: None  Follow-Up: Your physician wants you to follow-up in: 6 months. You will receive a reminder letter in the mail two months in advance. If you don't receive a letter, please call our office to schedule the follow-up appointment.      

## 2014-11-24 NOTE — Progress Notes (Signed)
Patient ID: [name], female   DOB: 1951-02-28, 64 y.o.   MRN: [id number]     Cardiology Office Note   Date:  11/24/2014   ID:  Allison [name], DOB 21-Jun-1950, MRN [id number]  PCP:  [name], MD    No chief complaint on file. f/u AFib   Wt Readings from Last 3 Encounters:  11/24/14 187 lb (84.823 kg)  10/16/14 183 lb (83.008 kg)  09/17/14 186 lb 1.9 oz (84.423 kg)       History of Present Illness: Allison [name] is a 64 y.o. female who had atrial fibrillation associated with hyperthyroidism several years ago. She underwent a Cardiolite stress test in 2014 which was negative for ischemia. She had successful shoulder surgery in 2014. She has not had any chest discomfort or palpitations recently.   At her last visit, she was found to be in atrial fibrillation. She was anticoagulated with Eliquis for 4 weeks. She then underwent cardioversion which was successful. Since that time, she has felt well. She denies any fluid retention symptoms. She denies any palpitations. She has had one fall when she tripped on a rug. She does note that she bruises easily. She has some nuisance bleeding but no signs of any internal bleeding.     Past Medical History  Diagnosis Date  . Hypertension   . GERD (gastroesophageal reflux disease)   . Arthritis     all over  . AC (acromioclavicular) joint bone spurs     spurs in shoulders with pain  . Complication of anesthesia   . PONV (postoperative nausea and vomiting)   . Hypothyroidism   . Asthma     from reflux  . Deaf   . Fibromyalgia   . Arrhythmia     Past Surgical History  Procedure Laterality Date  . Righr shoulder surgery  1999    spurs  . Wisdom tooth extraction    . Tonsillectomy    . Tubal ligation    . Thyroid surgery  1991  . Vericose surgery right leg  05-1998  . Eye surgery      catarcts  . Esophagogastroduodenoscopy endoscopy      several times  . Colonoscopy  [id number]  . Colonoscopy with propofol   04/09/2012    Procedure: COLONOSCOPY WITH PROPOFOL;  Surgeon: Charolett Bumpers, MD;  Location: [facility];  Service: Endoscopy;  Laterality: N/A;  . Abdominal hysterectomy      partial  . Total shoulder arthroplasty Right 07/02/2012    Procedure: TOTAL SHOULDER ARTHROPLASTY;  Surgeon: Mable Paris, MD;  Location: [facility] OR;  Service: Orthopedics;  Laterality: Right;  Right total shoulder arthroplasty  . Cardioversion N/A 10/16/2014    Procedure: CARDIOVERSION;  Surgeon: Corky Crafts, MD;  Location: [facility] ENDOSCOPY;  Service: Cardiovascular;  Laterality: N/A;     Current Outpatient Prescriptions  Medication Sig Dispense Refill  . ALPRAZolam (XANAX) 0.25 MG tablet Take 0.25 mg by mouth at bedtime.     Marland Kitchen apixaban (ELIQUIS) 5 MG TABS tablet Take 1 tablet (5 mg total) by mouth 2 (two) times daily. 180 tablet 1  . atenolol (TENORMIN) 50 MG tablet Take 1 tablet by mouth  daily before breakfast 90 tablet 2  . Calcium Carbonate-Vitamin D (CALTRATE 600+D) 600-400 MG-UNIT per tablet Take 1 tablet by mouth daily.    . Cyanocobalamin (VITAMIN B-12) 5000 MCG SUBL Place 1 tablet under the tongue. For nerve pain in feet and metabolism support    . cyclobenzaprine (FLEXERIL)  5 MG tablet Take 5 mg by mouth 3 (three) times daily as needed. Muscle spasms    . diphenhydrAMINE (BENADRYL) 25 mg capsule Take 25 mg by mouth every 6 (six) hours as needed. For itching    . furosemide (LASIX) 20 MG tablet TAKE 1 TABLET (20 MG TOTAL) BY MOUTH DAILY. 30 tablet 1  . gabapentin (NEURONTIN) 600 MG tablet Take 600 mg by mouth 3 (three) times daily.  6  . HYDROcodone-acetaminophen (NORCO/VICODIN) 5-325 MG per tablet TAKE 1 TABLET TWICE A DAY AS NEEDED FOR PAIN  0  . ipratropium (ATROVENT) 0.06 % nasal spray     . KLOR-CON 8 MEQ tablet TAKE 1 TABLET (8 MEQ TOTAL) BY MOUTH DAILY. 30 tablet 1  . levothyroxine (SYNTHROID, LEVOTHROID) 112 MCG tablet TAKE 1 TABLET ONCE A DAY ORALLY 30 DAYS  11  . LORazepam (ATIVAN) 2 MG  tablet Take 2 mg by mouth at bedtime as needed. For restful sleep    . montelukast (SINGULAIR) 10 MG tablet Take 10 mg by mouth daily before breakfast.     . omeprazole (PRILOSEC) 40 MG capsule     . OVER THE COUNTER MEDICATION Take 1 tablet by mouth every 4 (four) hours as needed. For pain. [employer] Backache Relief Extra Strength.    . oxyCODONE-acetaminophen (PERCOCET/ROXICET) 5-325 MG per tablet Take 1-2 tablets by mouth every 4 (four) hours as needed. 60 tablet 0  . promethazine (PHENERGAN) 25 MG tablet Take 25 mg by mouth every 8 (eight) hours as needed for nausea or vomiting.     . Simethicone (GAS-X PO) Take 1 tablet by mouth daily as needed. For gas     No current facility-administered medications for this visit.    Allergies:   Aleve; Bextra; Biaxin; Doxycycline; Durabac; Estradiol; Levaquin; Macrobid; Motrin; Oxaprozin; Robaxin; Sulfa antibiotics; Topamax; and Trazodone and nefazodone    Social History:  The patient  reports that she has never smoked. She has never used smokeless tobacco. She reports that she does not drink alcohol or use illicit drugs.   Family History:  The patient's family history includes Arrhythmia in her father and mother; Lung cancer in her father.    ROS:  Please see the history of present illness.   Otherwise, review of systems are positive for easy bruising on Eliquis.   All other systems are reviewed and negative.    PHYSICAL EXAM: VS:  BP 130/68 mmHg  Pulse 61  Ht 5\' 8"  (1.727 m)  Wt 187 lb (84.823 kg)  BMI 28.44 kg/m2 , BMI Body mass index is 28.44 kg/(m^2). GEN: Well nourished, well developed, in no acute distress HEENT: normal Neck: no JVD, carotid bruits, or masses Cardiac: RRR; no murmurs, rubs, or gallops,no edema  Respiratory:  clear to auscultation bilaterally, normal work of breathing GI: soft, nontender, nondistended, + BS MS: no deformity or atrophy Skin: warm and dry, no rash Neuro:  Strength and sensation are intact Psych: euthymic  mood, full affect   EKG:   The ekg ordered today demonstrates NSR, wavy baseline   Recent Labs: 10/12/2014: BUN 11; Creatinine, Ser 1.10; Potassium 4.1; Sodium 138   Lipid Panel No results found for: CHOL, TRIG, HDL, CHOLHDL, VLDL, LDLCALC, LDLDIRECT   Other studies Reviewed: Additional studies/ records that were reviewed today with results demonstrating: Cardioversion in 6/16.   ASSESSMENT AND PLAN:  Atrial fibrillation: This patients CHA2DS2-VASc Score and unadjusted Ischemic Stroke Rate (% per year) is equal to 2.2 % stroke rate/year from a score  of 2  Above score calculated as 1 point each if present [CHF, HTN, DM, Vascular=MI/PAD/Aortic Plaque, Age if 65-74, or Female] Above score calculated as 2 points each if present [Age > 75, or Stroke/TIA/TE]  Continue Eliquis for now for stroke prevention. Continue atenolol. This may be related to her thyroid. This is followed by Dr. Eula Listen and Dr. Talmage Nap.  She was counseled to do her best to try to avoid falling.    Current medicines are reviewed at length with the patient today.  The patient concerns regarding her medicines were addressed.  The following changes have been made:  No change  Labs/ tests ordered today include:  No orders of the defined types were placed in this encounter.    Recommend 150 minutes/week of aerobic exercise Low fat, low carb, high fiber diet recommended  Disposition:   FU in 6 months   Delorise Jackson., MD  11/24/2014 3:22 PM    [facility] [address] [place], [place], [place]  [address] Phone: [phone]; Fax: [phone]

## 2014-12-01 ENCOUNTER — Encounter: Payer: Self-pay | Admitting: Physician Assistant

## 2014-12-01 ENCOUNTER — Ambulatory Visit (INDEPENDENT_AMBULATORY_CARE_PROVIDER_SITE_OTHER): Payer: Medicare Other | Admitting: Physician Assistant

## 2014-12-01 ENCOUNTER — Encounter: Payer: Self-pay | Admitting: *Deleted

## 2014-12-01 VITALS — BP 100/60 | HR 57 | Ht 68.0 in | Wt 181.4 lb

## 2014-12-01 DIAGNOSIS — E039 Hypothyroidism, unspecified: Secondary | ICD-10-CM | POA: Diagnosis not present

## 2014-12-01 DIAGNOSIS — I10 Essential (primary) hypertension: Secondary | ICD-10-CM | POA: Diagnosis not present

## 2014-12-01 DIAGNOSIS — I480 Paroxysmal atrial fibrillation: Secondary | ICD-10-CM

## 2014-12-01 LAB — CBC WITH DIFFERENTIAL/PLATELET
Basophils Absolute: 0 10*3/uL (ref 0.0–0.1)
Basophils Relative: 0.5 % (ref 0.0–3.0)
Eosinophils Absolute: 0.1 10*3/uL (ref 0.0–0.7)
Eosinophils Relative: 1 % (ref 0.0–5.0)
HCT: 45.2 % (ref 36.0–46.0)
Hemoglobin: 14.9 g/dL (ref 12.0–15.0)
LYMPHS PCT: 37.6 % (ref 12.0–46.0)
Lymphs Abs: 3.8 10*3/uL (ref 0.7–4.0)
MCHC: 32.9 g/dL (ref 30.0–36.0)
MCV: 83.9 fl (ref 78.0–100.0)
MONOS PCT: 7.1 % (ref 3.0–12.0)
Monocytes Absolute: 0.7 10*3/uL (ref 0.1–1.0)
Neutro Abs: 5.4 10*3/uL (ref 1.4–7.7)
Neutrophils Relative %: 53.8 % (ref 43.0–77.0)
Platelets: 327 10*3/uL (ref 150.0–400.0)
RBC: 5.39 Mil/uL — ABNORMAL HIGH (ref 3.87–5.11)
RDW: 16.4 % — ABNORMAL HIGH (ref 11.5–15.5)
WBC: 10.1 10*3/uL (ref 4.0–10.5)

## 2014-12-01 LAB — BASIC METABOLIC PANEL
BUN: 14 mg/dL (ref 6–23)
CALCIUM: 9.8 mg/dL (ref 8.4–10.5)
CO2: 30 mEq/L (ref 19–32)
Chloride: 103 mEq/L (ref 96–112)
Creatinine, Ser: 1.26 mg/dL — ABNORMAL HIGH (ref 0.40–1.20)
GFR: 45.17 mL/min — ABNORMAL LOW (ref >60.00)
GLUCOSE: 86 mg/dL (ref 70–99)
Potassium: 3.9 mEq/L (ref 3.5–5.1)
Sodium: 141 mEq/L (ref 135–145)

## 2014-12-01 LAB — TSH: TSH: 1.3 u[IU]/mL (ref 0.35–4.50)

## 2014-12-01 MED ORDER — FLECAINIDE ACETATE 50 MG PO TABS: 50.0000 mg | ORAL_TABLET | Freq: Two times a day (BID) | ORAL | Status: DC

## 2014-12-01 NOTE — Progress Notes (Signed)
Pt walked in today c/o of dizziness, weakness over her body, sweating, SOB at times and feeling like she is going to faint all since Friday 11/27/14.  Pt is deaf but can communicate by reading lips and responds verbally.  States her BP yesterday was up and down.  Has not felt heart palpitating.  States she took Meclizine yesterday for veritgo but didn't help.  States she has had vertigo in past.  States when she walks head dizzy feeling and gets  SOB if walks any distance.  Gait is unsteady and leans on wall to get balance.  BP sitting was 108/70; standing 100/60 HR 102-irregular. EKG done which shows AF.  She had a CV 10/16/14. Reviewed with Lilian Coma who states he can see her at 2:00 today. She is agreeable and will come back then.

## 2014-12-01 NOTE — Patient Instructions (Signed)
Medication Instructions:  1. START FLECAINIDE 50 MG TWICE DAILY; RX SENT IN TODAY   2. HOLD LASIX AND POTASSIUM FOR 2 DAYS THEN RESUME LASIX AND POTASSIUM  Labwork: TODAY BMET, CBC W/DIFF, TSH  Testing/Procedures: 1. Your physician has requested that you have en exercise stress myoview THIS IS TO BE DONE IN THE NEXT WEEK POST CARDIOVERSION BEFORE HER APPT WITH DR. VARANASI. For further information please visit [email or web address] Please follow instruction sheet, as given.  2. Your physician has recommended that you have a Cardioversion (DCCV). Electrical Cardioversion uses a jolt of electricity to your heart either through paddles or wired patches attached to your chest. This is a controlled, usually prescheduled, procedure. Defibrillation is done under light anesthesia in the hospital, and you usually go home the day of the procedure. This is done to get your heart back into a normal rhythm. You are not awake for the procedure. Please see the instruction sheet given to you today.  Follow-Up: Tereso Newcomer, [facility] 12/16/14 @ 12:10  Any Other Special Instructions Will Be Listed Below (If Applicable).

## 2014-12-01 NOTE — Progress Notes (Signed)
Cardiology Office Note   Date:  12/01/2014   ID:  Allison [name], DOB 1950/05/19, MRN [id number]  PCP:  [name], MD  Cardiologist:  Dr. Everette Rank   Electrophysiologist:  n/a  Chief Complaint  Patient presents with  . Atrial Fibrillation     History of Present Illness: Allison [name] is a 64 y.o. female with a hx of deafness, paroxysmal atrial fibrillation with associated hyperthyroidism several years ago, HTN, fibromyalgia, asthma, GERD.   She was noted to be back in atrial fibrillation when she saw Dr. Eldridge Dace in May. She was placed on Eliquis and set up for cardioversion. Last seen by Dr. Eldridge Dace 11/24/14. She was maintaining NSR after cardioversion. She walked into the office today with complaints of dizziness. EKG confirmed recurrent atrial fibrillation. She is added on for further evaluation.  She felt well last week when she saw Dr. Everette Rank.  She started to feel poorly over the last few days. She has been dizzy with symptoms of near syncope at times.  She denies frank syncope. She notes increased DOE.  She is NYHA 2-2b.  She denies orthopnea, PND.  No edema.  No chest pain.  No fevers. She is diaphoretic at times.  She feels like she did several years ago when she was hyperthyroid.     Studies/Reports Reviewed Today:  Echo 10/22/13:  EF 55-60%, normal wall motion, grade 1 diastolic dysfunction, mild AI, trivial MR, mild LAE, normal RV function, PASP 21 mmHg  Myoview 06/25/12  normal perfusion, EF 67%, low risk     Past Medical History  Diagnosis Date  . Hypertension   . GERD (gastroesophageal reflux disease)   . Arthritis     all over  . AC (acromioclavicular) joint bone spurs     spurs in shoulders with pain  . Complication of anesthesia   . PONV (postoperative nausea and vomiting)   . Hypothyroidism   . Asthma     from reflux  . Deaf   . Fibromyalgia   . Arrhythmia     Past Surgical History  Procedure Laterality Date  . Righr shoulder  surgery  1999    spurs  . Wisdom tooth extraction    . Tonsillectomy    . Tubal ligation    . Thyroid surgery  1991  . Vericose surgery right leg  05-1998  . Eye surgery      catarcts  . Esophagogastroduodenoscopy endoscopy      several times  . Colonoscopy  [id number]  . Colonoscopy with propofol  04/09/2012    Procedure: COLONOSCOPY WITH PROPOFOL;  Surgeon: Charolett Bumpers, MD;  Location: [facility] ENDOSCOPY;  Service: Endoscopy;  Laterality: N/A;  . Abdominal hysterectomy      partial  . Total shoulder arthroplasty Right 07/02/2012    Procedure: TOTAL SHOULDER ARTHROPLASTY;  Surgeon: Mable Paris, MD;  [facility]: [facility] OR;  Service: Orthopedics;  Laterality: Right;  Right total shoulder arthroplasty  . Cardioversion N/A 10/16/2014    Procedure: CARDIOVERSION;  Surgeon: Corky Crafts, MD;  Location: [facility] ENDOSCOPY;  Service: Cardiovascular;  Laterality: N/A;     Current Outpatient Prescriptions  Medication Sig Dispense Refill  . ALPRAZolam (XANAX) 0.25 MG tablet Take 0.25 mg by mouth at bedtime.     Marland Kitchen apixaban (ELIQUIS) 5 MG TABS tablet Take 1 tablet (5 mg total) by mouth 2 (two) times daily. 180 tablet 1  . atenolol (TENORMIN) 50 MG tablet Take 1 tablet by mouth  daily before breakfast  90 tablet 2  . Calcium Carbonate-Vitamin D (CALTRATE 600+D) 600-400 MG-UNIT per tablet Take 1 tablet by mouth daily.    . Cyanocobalamin (VITAMIN B-12) 5000 MCG SUBL Place 1 tablet under the tongue. For nerve pain in feet and metabolism support    . cyclobenzaprine (FLEXERIL) 5 MG tablet Take 5 mg by mouth 3 (three) times daily as needed. Muscle spasms    . diphenhydrAMINE (BENADRYL) 25 mg capsule Take 25 mg by mouth every 6 (six) hours as needed. For itching    . furosemide (LASIX) 20 MG tablet TAKE 1 TABLET (20 MG TOTAL) BY MOUTH DAILY. 30 tablet 1  . gabapentin (NEURONTIN) 600 MG tablet Take 600 mg by mouth 3 (three) times daily.  6  . HYDROcodone-acetaminophen (NORCO/VICODIN) 5-325 MG per  tablet TAKE 1 TABLET TWICE A DAY AS NEEDED FOR PAIN  0  . ipratropium (ATROVENT) 0.06 % nasal spray Place 1 spray into both nostrils daily as needed for rhinitis.     Marland Kitchen KLOR-CON 8 MEQ tablet TAKE 1 TABLET (8 MEQ TOTAL) BY MOUTH DAILY. 30 tablet 1  . levothyroxine (SYNTHROID, LEVOTHROID) 112 MCG tablet TAKE 1 TABLET ONCE A DAY ORALLY 30 DAYS  11  . LORazepam (ATIVAN) 2 MG tablet Take 2 mg by mouth at bedtime as needed. For restful sleep    . montelukast (SINGULAIR) 10 MG tablet Take 10 mg by mouth daily before breakfast.     . omeprazole (PRILOSEC) 40 MG capsule Take 40 mg by mouth daily.     . promethazine (PHENERGAN) 25 MG tablet Take 25 mg by mouth daily as needed for nausea or vomiting.    . flecainide (TAMBOCOR) 50 MG tablet Take 1 tablet (50 mg total) by mouth 2 (two) times daily. 60 tablet 11   No current facility-administered medications for this visit.    Allergies:   Aleve; Bextra; Biaxin; Doxycycline; Durabac; Estradiol; Levaquin; Macrobid; Motrin; Oxaprozin; Robaxin; Sulfa antibiotics; Topamax; and Trazodone and nefazodone    Social History:  The patient  reports that she has never smoked. She has never used smokeless tobacco. She reports that she does not drink alcohol or use illicit drugs.   Family History:  The patient's family history includes Arrhythmia in her father and mother; Lung cancer in her father.    ROS:   Please see the history of present illness.   Review of Systems  Constitution: Positive for chills and diaphoresis.  Cardiovascular: Positive for dyspnea on exertion.  Gastrointestinal: Positive for abdominal pain.  Neurological: Positive for dizziness.  All other systems reviewed and are negative.     PHYSICAL EXAM: VS:  BP 100/60 mmHg  Pulse 57  Ht 5\' 8"  (1.727 m)  Wt 181 lb 6.4 oz (82.283 kg)  BMI 27.59 kg/m2  SpO2 98%    Orthostatic VS for the past 24 hrs:  BP- Lying Pulse- Lying BP- Sitting Pulse- Sitting BP- Standing at 0 minutes Pulse-  Standing at 0 minutes  12/01/14 1656 129/68 mmHg 70 109/67 mmHg 100 115/66 mmHg 83    Wt Readings from Last 3 Encounters:  12/01/14 181 lb 6.4 oz (82.283 kg)  12/01/14 179 lb (81.194 kg)  11/24/14 187 lb (84.823 kg)     GEN: Well nourished, well developed, in no acute distress HEENT: normal Neck: no JVD, no carotid bruits, no masses Cardiac:  Normal S1/S2, irreg irreg rhythm; no murmur ,  no rubs or gallops, no edema   Respiratory:  clear to auscultation bilaterally, no wheezing, rhonchi  or rales. GI: soft, nontender, nondistended, + BS MS: no deformity or atrophy Skin: warm and dry  Neuro:  CNs II-XII intact, Strength and sensation are intact Psych: Normal affect   EKG:  EKG is ordered today.  It demonstrates:   AFib, HR 91   Recent Labs: 12/01/2014: BUN 14; Creatinine, Ser 1.26*; Hemoglobin 14.9; Platelets 327.0; Potassium 3.9; Sodium 141; TSH 1.30    Lipid Panel No results found for: CHOL, TRIG, HDL, CHOLHDL, VLDL, LDLCALC, LDLDIRECT    ASSESSMENT AND PLAN:   1.  Paroxysmal Atrial Fibrillation:  She is back in AFib with controlled VR.  She is much more symptomatic this time than when she had AF in May.  Symptoms seem to be contributing to some anxiety as well.  Orthostatic VS today do demonstrate a BP drop and a HR increase.  I will hold her Lasix and K+ x 2 days, then resume.  I reviewed her case with Dr. Verdis Prime (DOD).  I will start her on Flecainide 50 mg bid.  Arrange DCCV later this week.  She can go to the ED if she feels worse.  Check CBC, BMET, TSH today.  Continue Eliquis.  She has not missed any doses.  We discussed the importance of this.  Consider referral to the AFib Clinic if she has recurrent AFib.  Arrange ETT-Myoview to rule out pro-arrhythmia with Flecainide as well as to rule out ischemic heart disease.    2.  Hypertension:  As noted, she is s/w orthostatic.  Will cut back on Lasix for a couple days as noted.  3.  Hypothyroidism:  Repeat  TSH.    Medication Changes: Current medicines are reviewed at length with the patient today.  Concerns regarding medicines are as outlined above.  The following changes have been made:   Discontinued Medications   OVER THE COUNTER MEDICATION    Take 1 tablet by mouth every 4 (four) hours as needed. For pain. [employer] Backache Relief Extra Strength.   OXYCODONE-ACETAMINOPHEN (PERCOCET/ROXICET) 5-325 MG PER TABLET    Take 1-2 tablets by mouth every 4 (four) hours as needed.   PROMETHAZINE (PHENERGAN) 25 MG TABLET    Take 25 mg by mouth every 8 (eight) hours as needed for nausea or vomiting.    SIMETHICONE (GAS-X PO)    Take 1 tablet by mouth daily as needed. For gas   Modified Medications   No medications on file   New Prescriptions   FLECAINIDE (TAMBOCOR) 50 MG TABLET    Take 1 tablet (50 mg total) by mouth 2 (two) times daily.    Labs/ tests ordered today include:   Orders Placed This Encounter  Procedures  . Basic Metabolic Panel (BMET)  . CBC w/Diff  . TSH  . Myocardial Perfusion Imaging  . EKG 12-Lead     Disposition:   FU with Dr. Everette Rank 2 weeks.    Signed, Brynda Rim, MHS 12/01/2014 5:25 PM    [facility] [address] [place], [place], [place]  [address] Phone: [phone]; Fax: [phone]

## 2014-12-02 ENCOUNTER — Ambulatory Visit (HOSPITAL_COMMUNITY)
Admission: RE | Admit: 2014-12-02 | Discharge: 2014-12-02 | Disposition: A | Discharge status: Home / Self Care | Payer: Medicare Other | Source: Ambulatory Visit | Attending: Nurse Practitioner | Admitting: Nurse Practitioner

## 2014-12-02 ENCOUNTER — Telehealth: Payer: Self-pay

## 2014-12-02 VITALS — BP 112/70

## 2014-12-02 DIAGNOSIS — I481 Persistent atrial fibrillation: Secondary | ICD-10-CM | POA: Insufficient documentation

## 2014-12-02 DIAGNOSIS — I4819 Other persistent atrial fibrillation: Secondary | ICD-10-CM

## 2014-12-02 MED ORDER — PROPAFENONE HCL 150 MG PO TABS: 150.0000 mg | ORAL_TABLET | Freq: Three times a day (TID) | ORAL | Status: DC

## 2014-12-02 NOTE — Patient Instructions (Addendum)
Your physician has recommended you make the following change in your medication:  1)Propafenone 150mg  take 1 tablet -- 3 times a day (take first dose tonight) 2) Do not take flecainide  Do not miss any doses of eliquis prior to cardioversion. Keep appointments for stress test and follow up with [facility] as scheduled.

## 2014-12-02 NOTE — Telephone Encounter (Signed)
Called interpreter to forward message to patient, giving very detailed instruction of place and time of A-Fib clinic appt. today. Patient states she did not receive message.  At 12:30 pm I called her back, via interpreter, to give her instruction again to stop taking Flecainide and come in to the A Fib Clinic today at 2:00.  Patient acknowleges that she understood and was thankful for the help.

## 2014-12-02 NOTE — Telephone Encounter (Signed)
Patient called in by interpreter stating the patient started Flecainide yesterday.  Last night she suffered with severe abdominal pain and stomach upset.  The patient did not take the Flecainide this morning.  I spoke with Tereso Newcomer concerning, he gave orders to stop Flecainide and come in to [facility] Fib Clinic today.  She has an appt. at 2:00 pm.

## 2014-12-03 ENCOUNTER — Encounter (HOSPITAL_COMMUNITY): Payer: Self-pay | Admitting: Nurse Practitioner

## 2014-12-03 NOTE — Progress Notes (Signed)
Patient ID: Allison [name], female   DOB: 08/21/1950, 64 y.o.   MRN: [id number]     Primary Care Physician: Allison Housekeeper, MD Referring Physician:   ANITRA [name] is a 63 y.o. female with a h/o PAF that recently had a successful cardioversion with ERAF. When she is in afib, she feels lightheaded and weak. She was recently evaluated by Allison Newcomer PA and Dr. Katrinka Blazing and decision was made to start flecainide 50 mg bid and DCCV on Friday with stress test next week, no  known CAD.(see Allison [name]'s note  as of 8/2). She is deaf and is here with a translator, but can read lips and talk well.  She took one dose of flecainide and she developed severe abdominal pin within one hour and was in discomfort all night. She is not quite over the discomfort yet but much improved. She is being seen in the afib clinic for other options for she says she will not take anymore flecainide. It was discussed with Dr. Johney [name] and decided to start propafenone 150 mg tid and continue with all the other plans that Allison [name] has pit into place.  Today, she denies symptoms of palpitations, chest pain, shortness of breath, orthopnea, PND, lower extremity edema, dizziness, presyncope, syncope, or neurologic sequela. The patient is tolerating medications without difficulties and is otherwise without complaint today.   Past Medical History  Diagnosis Date  . Hypertension   . GERD (gastroesophageal reflux disease)   . Arthritis     all over  . AC (acromioclavicular) joint bone spurs     spurs in shoulders with pain  . Complication of anesthesia   . PONV (postoperative nausea and vomiting)   . Hypothyroidism   . Asthma     from reflux  . Deaf   . Fibromyalgia   . Arrhythmia    Past Surgical History  Procedure Laterality Date  . Righr shoulder surgery  1999    spurs  . Wisdom tooth extraction    . Tonsillectomy    . Tubal ligation    . Thyroid surgery  1991  . Vericose surgery right leg  05-1998  . Eye  surgery      catarcts  . Esophagogastroduodenoscopy endoscopy      several times  . Colonoscopy  [id number]  . Colonoscopy with propofol  04/09/2012    Procedure: COLONOSCOPY WITH PROPOFOL;  Surgeon: Allison Bumpers, MD;  Location: [facility] ENDOSCOPY;  Service: Endoscopy;  Laterality: N/A;  . Abdominal hysterectomy      partial  . Total shoulder arthroplasty Right 07/02/2012    Procedure: TOTAL SHOULDER ARTHROPLASTY;  Surgeon: Allison Paris, MD;  [facility] OR;  Service: Orthopedics;  Laterality: Right;  Right total shoulder arthroplasty  . Cardioversion N/A 10/16/2014    Procedure: CARDIOVERSION;  Surgeon: Allison Crafts, MD;  Location: [facility] ENDOSCOPY;  Service: Cardiovascular;  Laterality: N/A;    Current Outpatient Prescriptions  Medication Sig Dispense Refill  . ALPRAZolam (XANAX) 0.25 MG tablet Take 0.25 mg by mouth at bedtime.     Marland Kitchen apixaban (ELIQUIS) 5 MG TABS tablet Take 1 tablet (5 mg total) by mouth 2 (two) times daily. 180 tablet 1  . atenolol (TENORMIN) 50 MG tablet Take 1 tablet by mouth  daily before breakfast 90 tablet 2  . Calcium Carbonate-Vitamin D (CALTRATE 600+D) 600-400 MG-UNIT per tablet Take 1 tablet by mouth daily.    . Cyanocobalamin (VITAMIN B-12) 5000 MCG SUBL Place 1 tablet under the tongue. For  nerve pain in feet and metabolism support    . cyclobenzaprine (FLEXERIL) 5 MG tablet Take 5 mg by mouth 3 (three) times daily as needed. Muscle spasms    . diphenhydrAMINE (BENADRYL) 25 mg capsule Take 25 mg by mouth every 6 (six) hours as needed. For itching    . furosemide (LASIX) 20 MG tablet TAKE 1 TABLET (20 MG TOTAL) BY MOUTH DAILY. 30 tablet 1  . gabapentin (NEURONTIN) 600 MG tablet Take 600 mg by mouth 3 (three) times daily.  6  . HYDROcodone-acetaminophen (NORCO/VICODIN) 5-325 MG per tablet TAKE 1 TABLET TWICE A DAY AS NEEDED FOR PAIN  0  . ipratropium (ATROVENT) 0.06 % nasal spray Place 1 spray into both nostrils daily as needed for rhinitis.     Marland Kitchen  KLOR-CON 8 MEQ tablet TAKE 1 TABLET (8 MEQ TOTAL) BY MOUTH DAILY. 30 tablet 1  . levothyroxine (SYNTHROID, LEVOTHROID) 112 MCG tablet TAKE 1 TABLET ONCE A DAY ORALLY 30 DAYS  11  . LORazepam (ATIVAN) 2 MG tablet Take 2 mg by mouth at bedtime as needed. For restful sleep    . montelukast (SINGULAIR) 10 MG tablet Take 10 mg by mouth daily before breakfast.     . omeprazole (PRILOSEC) 40 MG capsule Take 40 mg by mouth daily.     . promethazine (PHENERGAN) 25 MG tablet Take 25 mg by mouth daily as needed for nausea or vomiting.    . propafenone (RYTHMOL) 150 MG tablet Take 1 tablet (150 mg total) by mouth 3 (three) times daily. 90 tablet 1   No current facility-administered medications for this encounter.    Allergies  Allergen Reactions  . Aleve [Naproxen] Other (See Comments)    Stomach ache  . Bextra [Valdecoxib] Other (See Comments)    Stomach ache   . Biaxin [Clarithromycin] Other (See Comments)    Dizzy, blurred vision, achy stomach  . Doxycycline Other (See Comments)    Stomach ache  . Durabac [Apap-Salicyl-Phenyltolox-Caff] Nausea And Vomiting  . Estradiol Other (See Comments)    Hurt stomach  . Levaquin [Levofloxacin] Other (See Comments)    Dizzy, achy stomach, can't sleep  . Macrobid [Nitrofurantoin] Other (See Comments)    bloating  . Motrin [Ibuprofen] Other (See Comments)    Stomach ahce  . Oxaprozin Other (See Comments)    Hurt stomach  . Robaxin [Methocarbamol] Other (See Comments)    Hurt stomach  . Sulfa Antibiotics Other (See Comments)    Blister on large toe  . Topamax [Topiramate] Nausea And Vomiting  . Trazodone And Nefazodone Other (See Comments)    Weakness in legs, numbness in arms, funny feeling, rapid heart beat, loose focus    History   Social History  . Marital Status: Married    Spouse Name: N/A  . Number of Children: N/A  . Years of Education: N/A   Occupational History  . Not on file.   Social History Main Topics  . Smoking status:  Never Smoker   . Smokeless tobacco: Never Used  . Alcohol Use: No  . Drug Use: No  . Sexual Activity: Not on file   Other Topics Concern  . Not on file   Social History Narrative    Family History  Problem Relation Age of Onset  . Lung cancer Father   . Arrhythmia Father   . Arrhythmia Mother     ROS- All systems are reviewed and negative except as per the HPI above  Physical Exam: Filed Vitals:  12/02/14 1423  BP: 112/70    GEN- The patient is well appearing, alert and oriented x 3 today.   Head- normocephalic, atraumatic Eyes-  Sclera clear, conjunctiva pink Ears- deaf Oropharynx- clear Neck- supple, no JVP Lymph- no cervical lymphadenopathy Lungs- Clear to ausculation bilaterally, normal work of breathing Heart- Iregular rate and rhythm, no murmurs, rubs or gallops, PMI not laterally displaced GI- soft, NT, ND, + BS Extremities- no clubbing, cyanosis, or edema MS- no significant deformity or atrophy Skin- no rash or lesion Psych- euthymic mood, full affect Neuro- strength and sensation are intact  EKG- Afib at 84 bpm, Qrs 86 bpm, QTc 437 ms  Assessment and Plan: 1. Persistent symptomatic afib with intolerance of flecainide Stop flecainide and start propafenone 150 mg tid Continue with plans for cardioversion, and stress test as scheduled. F/u with [facility] as scheduled after cardioversion Continue apixaban without fail.   Pt was discussed and plan made in collaboration with Dr. Nehemiah Settle C. [facility] [address] [place], [place] [address] [phone]

## 2014-12-04 ENCOUNTER — Telehealth: Payer: Self-pay | Admitting: Cardiovascular Disease

## 2014-12-04 ENCOUNTER — Telehealth: Payer: Self-pay | Admitting: Physician Assistant

## 2014-12-04 ENCOUNTER — Encounter (HOSPITAL_COMMUNITY): Payer: Self-pay

## 2014-12-04 ENCOUNTER — Ambulatory Visit (HOSPITAL_COMMUNITY): Payer: Medicare Other | Admitting: Certified Registered Nurse Anesthetist

## 2014-12-04 ENCOUNTER — Encounter (HOSPITAL_COMMUNITY)
Admission: RE | Disposition: A | Discharge status: Home / Self Care | Payer: Self-pay | Source: Ambulatory Visit | Attending: Cardiovascular Disease

## 2014-12-04 ENCOUNTER — Ambulatory Visit (HOSPITAL_COMMUNITY)
Admission: RE | Admit: 2014-12-04 | Discharge: 2014-12-04 | Disposition: A | Discharge status: Home / Self Care | Payer: Medicare Other | Source: Ambulatory Visit | Attending: Cardiovascular Disease | Admitting: Cardiovascular Disease

## 2014-12-04 DIAGNOSIS — K219 Gastro-esophageal reflux disease without esophagitis: Secondary | ICD-10-CM | POA: Insufficient documentation

## 2014-12-04 DIAGNOSIS — I480 Paroxysmal atrial fibrillation: Secondary | ICD-10-CM | POA: Diagnosis not present

## 2014-12-04 DIAGNOSIS — M797 Fibromyalgia: Secondary | ICD-10-CM | POA: Insufficient documentation

## 2014-12-04 DIAGNOSIS — M1990 Unspecified osteoarthritis, unspecified site: Secondary | ICD-10-CM | POA: Insufficient documentation

## 2014-12-04 DIAGNOSIS — Z79899 Other long term (current) drug therapy: Secondary | ICD-10-CM | POA: Insufficient documentation

## 2014-12-04 DIAGNOSIS — I10 Essential (primary) hypertension: Secondary | ICD-10-CM | POA: Diagnosis not present

## 2014-12-04 DIAGNOSIS — J45909 Unspecified asthma, uncomplicated: Secondary | ICD-10-CM | POA: Insufficient documentation

## 2014-12-04 DIAGNOSIS — H9190 Unspecified hearing loss, unspecified ear: Secondary | ICD-10-CM | POA: Diagnosis not present

## 2014-12-04 DIAGNOSIS — E039 Hypothyroidism, unspecified: Secondary | ICD-10-CM | POA: Diagnosis not present

## 2014-12-04 DIAGNOSIS — I481 Persistent atrial fibrillation: Secondary | ICD-10-CM | POA: Diagnosis present

## 2014-12-04 DIAGNOSIS — Z79891 Long term (current) use of opiate analgesic: Secondary | ICD-10-CM | POA: Insufficient documentation

## 2014-12-04 DIAGNOSIS — Z7901 Long term (current) use of anticoagulants: Secondary | ICD-10-CM | POA: Insufficient documentation

## 2014-12-04 HISTORY — PX: CARDIOVERSION: SHX1299

## 2014-12-04 SURGERY — CARDIOVERSION
Anesthesia: Monitor Anesthesia Care

## 2014-12-04 MED ORDER — SODIUM CHLORIDE 0.9 % IV SOLN
INTRAVENOUS | Status: DC | PRN
  Administered 2014-12-04: 10:00:00 via INTRAVENOUS

## 2014-12-04 MED ORDER — PROPOFOL 10 MG/ML IV BOLUS
INTRAVENOUS | Status: DC | PRN
  Administered 2014-12-04: 90 mg via INTRAVENOUS

## 2014-12-04 MED ORDER — SODIUM CHLORIDE 0.9 % IJ SOLN: 3.0000 mL | Freq: Two times a day (BID) | INTRAMUSCULAR | Status: DC

## 2014-12-04 MED ORDER — LIDOCAINE HCL (CARDIAC) 20 MG/ML IV SOLN
INTRAVENOUS | Status: DC | PRN
  Administered 2014-12-04: 60 mg via INTRAVENOUS

## 2014-12-04 MED ORDER — SODIUM CHLORIDE 0.9 % IJ SOLN: 3.0000 mL | INTRAMUSCULAR | Status: DC | PRN

## 2014-12-04 MED ORDER — SODIUM CHLORIDE 0.9 % IV SOLN: 250.0000 mL | INTRAVENOUS | Status: DC

## 2014-12-04 NOTE — Telephone Encounter (Signed)
New message     Pt called very upset because an interpreter was not scheduled to be at her procedure this morning Please call pt to discuss

## 2014-12-04 NOTE — Transfer of Care (Signed)
Immediate Anesthesia Transfer of Care Note  Patient: Allison [name]  Procedure(s) Performed: Procedure(s): CARDIOVERSION (N/A)  Patient Location: Endoscopy Unit  Anesthesia Type:MAC  Level of Consciousness: awake, alert  and oriented  Airway & Oxygen Therapy: Patient Spontanous Breathing and Patient connected to nasal cannula oxygen  Post-op Assessment: Report given to RN, Post -op Vital signs reviewed and stable and Patient moving all extremities  Post vital signs: Reviewed and stable  Last Vitals:  Filed Vitals:   12/04/14 1007  BP: 99/51  Pulse: 65  Temp:   Resp: 18    Complications: No apparent anesthesia complications

## 2014-12-04 NOTE — Anesthesia Postprocedure Evaluation (Signed)
  Anesthesia Post-op Note  Patient: Allison [name]  Procedure(s) Performed: Procedure(s): CARDIOVERSION (N/A)  Patient Location: PACU  Anesthesia Type:MAC  Level of Consciousness: awake, alert  and oriented  Airway and Oxygen Therapy: Patient Spontanous Breathing  Post-op Pain: none  Post-op Assessment: Post-op Vital signs reviewed              Post-op Vital Signs: Reviewed  Last Vitals:  Filed Vitals:   12/04/14 1022  BP: 123/43  Pulse:   Temp:   Resp:     Complications: No apparent anesthesia complications

## 2014-12-04 NOTE — Telephone Encounter (Signed)
Recently, patient stopped flecainide and started Rythmol due to intense ABD cramping/bloating and constipation. Patient st the symptoms more bearable on the Rythmol, but she still has constipation pains. Patient requesting to stop Rythmol. Per Norma Fredrickson, patient is to continue Rythmol, try Miralax, eat lots of fruits and vegetables, and drink plenty of water over the weekend and have an appointment in the Afib Clinic next week to reevaluate medications. OV scheduled with Rudi Coco on Monday at 1100 per patient request.

## 2014-12-04 NOTE — Discharge Instructions (Signed)
Conscious Sedation, Adult, Care After °Refer to this sheet in the next few weeks. These instructions provide you with information on caring for yourself after your procedure. Your health care provider may also give you more specific instructions. Your treatment has been planned according to current medical practices, but problems sometimes occur. Call your health care provider if you have any problems or questions after your procedure. °WHAT TO EXPECT AFTER THE PROCEDURE  °After your procedure: °· You may feel sleepy, clumsy, and have poor balance for several hours. °· Vomiting may occur if you eat too soon after the procedure. °HOME CARE INSTRUCTIONS °· Do not participate in any activities where you could become injured for at least 24 hours. Do not: °¨ Drive. °¨ Swim. °¨ Ride a bicycle. °¨ Operate heavy machinery. °¨ Cook. °¨ Use power tools. °¨ Climb ladders. °¨ Work from a high place. °· Do not make important decisions or sign legal documents until you are improved. °· If you vomit, drink water, juice, or soup when you can drink without vomiting. Make sure you have little or no nausea before eating solid foods. °· Only take over-the-counter or prescription medicines for pain, discomfort, or fever as directed by your health care provider. °· Make sure you and your family fully understand everything about the medicines given to you, including what side effects may occur. °· You should not drink alcohol, take sleeping pills, or take medicines that cause drowsiness for at least 24 hours. °· If you smoke, do not smoke without supervision. °· If you are feeling better, you may resume normal activities 24 hours after you were sedated. °· Keep all appointments with your health care provider. °SEEK MEDICAL CARE IF: °· Your skin is pale or bluish in color. °· You continue to feel nauseous or vomit. °· Your pain is getting worse and is not helped by medicine. °· You have bleeding or swelling. °· You are still sleepy or  feeling clumsy after 24 hours. °SEEK IMMEDIATE MEDICAL CARE IF: °· You develop a rash. °· You have difficulty breathing. °· You develop any type of allergic problem. °· You have a fever. °MAKE SURE YOU: °· Understand these instructions. °· Will watch your condition. °· Will get help right away if you are not doing well or get worse. °Document Released: 02/05/2013 Document Reviewed: 02/05/2013 °[employer]® Patient Information ©2015 [employer]. This information is not intended to replace advice given to you by your health care provider. Make sure you discuss any questions you have with your health care provider. °Electrical Cardioversion, Care After °Refer to this sheet in the next few weeks. These instructions provide you with information on caring for yourself after your procedure. Your health care provider may also give you more specific instructions. Your treatment has been planned according to current medical practices, but problems sometimes occur. Call your health care provider if you have any problems or questions after your procedure. °WHAT TO EXPECT AFTER THE PROCEDURE °After your procedure, it is typical to have the following sensations: °· Some redness on the skin where the shocks were delivered. If this is tender, a sunburn lotion or hydrocortisone cream may help. °· Possible return of an abnormal heart rhythm within hours or days after the procedure. °HOME CARE INSTRUCTIONS °· Take medicines only as directed by your health care provider. Be sure you understand how and when to take your medicine. °· Learn how to feel your pulse and check it often. °· Limit your activity for 48 hours after   the procedure or as directed by your health care provider. °· Avoid or minimize caffeine and other stimulants as directed by your health care provider. °SEEK MEDICAL CARE IF: °· You feel like your heart is beating too fast or your pulse is not regular. °· You have any questions about your medicines. °· You have bleeding  that will not stop. °SEEK IMMEDIATE MEDICAL CARE IF: °· You are dizzy or feel faint. °· It is hard to breathe or you feel short of breath. °· There is a change in discomfort in your chest. °· Your speech is slurred or you have trouble moving an arm or leg on one side of your body. °· You get a serious muscle cramp that does not go away. °· Your fingers or toes turn cold or blue. °Document Released: 02/05/2013 Document Revised: 09/01/2013 Document Reviewed: 02/05/2013 °[employer]® Patient Information ©2015 [employer]. This information is not intended to replace advice given to you by your health care provider. Make sure you discuss any questions you have with your health care provider. ° °

## 2014-12-04 NOTE — Interval H&P Note (Signed)
History and Physical Interval Note:  12/04/2014 9:07 AM  Allison [name]  has presented today for surgery, with the diagnosis of A FIB  The various methods of treatment have been discussed with the patient and family. After consideration of risks, benefits and other options for treatment, the patient has consented to  Procedure(s): CARDIOVERSION (N/A) as a surgical intervention .  The patient's history has been reviewed, patient examined, no change in status, stable for surgery.  I have reviewed the patient's chart and labs.  Questions were answered to the patient's satisfaction.     [name]

## 2014-12-04 NOTE — Op Note (Signed)
Procedure: Electrical Cardioversion Indications:  Atrial Fibrillation  Procedure Details:  Consent: Risks of procedure as well as the alternatives and risks of each were explained to the (patient/caregiver).  Consent for procedure obtained.  Time Out: Verified patient identification, verified procedure, site/side was marked, verified correct patient position, special equipment/implants available, medications/allergies/relevent history reviewed, required imaging and test results available.  Performed  Patient placed on cardiac monitor, pulse oximetry, supplemental oxygen as necessary.  Sedation given: IV propofol, Dr. Hart Rochester Pacer pads placed anterior and posterior chest.  Cardioverted 1 time(s).  Cardioversion with synchronized biphasic 120J shock.  Evaluation: Findings: Post procedure EKG shows: NSR Complications: None Patient did tolerate procedure well.  Time Spent Directly with the Patient:  60 minutes   Rendon Howell 12/04/2014, 9:58 AM

## 2014-12-04 NOTE — Anesthesia Preprocedure Evaluation (Addendum)
Anesthesia Evaluation  Patient identified by MRN, date of birth, ID band Patient awake    Reviewed: Allergy & Precautions, NPO status , Patient's Chart, lab work & pertinent test results, reviewed documented beta blocker date and time   History of Anesthesia Complications (+) PONV and history of anesthetic complications  Airway Mallampati: II  TM Distance: >3 FB Neck ROM: Full    Dental no notable dental hx.    Pulmonary shortness of breath, asthma ,  breath sounds clear to auscultation  Pulmonary exam normal       Cardiovascular hypertension, Pt. on medications and Pt. on home beta blockers Normal cardiovascular exam+ dysrhythmias Rhythm:Regular Rate:Normal     Neuro/Psych  Neuromuscular disease negative psych ROS   GI/Hepatic Neg liver ROS, GERD-  ,  Endo/Other  Hypothyroidism   Renal/GU negative Renal ROS     Musculoskeletal  (+) Arthritis -, Fibromyalgia -  Abdominal   Peds  Hematology negative hematology ROS (+)   Anesthesia Other Findings   Reproductive/Obstetrics negative OB ROS                            Anesthesia Physical Anesthesia Plan  ASA: III  Anesthesia Plan: MAC   Post-op Pain Management:    Induction: Intravenous  Airway Management Planned: Mask  Additional Equipment:   Intra-op Plan:   Post-operative Plan:   Informed Consent: I have reviewed the patients History and Physical, chart, labs and discussed the procedure including the risks, benefits and alternatives for the proposed anesthesia with the patient or authorized representative who has indicated his/her understanding and acceptance.     Plan Discussed with: CRNA  Anesthesia Plan Comments:        Anesthesia Quick Evaluation

## 2014-12-04 NOTE — H&P (View-Only) (Signed)
Patient ID: Allison [name], female   DOB: 08/21/1950, 64 y.o.   MRN: [id number]     Primary Care Physician: [name], MD Referring Physician:   [name] is a 64 y.o. female with a h/o PAF that recently had a successful cardioversion with ERAF. When she is in afib, she feels lightheaded and weak. She was recently evaluated by Tereso Newcomer PA and Dr. Katrinka Blazing and decision was made to start flecainide 50 mg bid and DCCV on Friday with stress test next week, no  known CAD.(see [name]'s note  as of 8/2). She is deaf and is here with a translator, but can read lips and talk well.  She took one dose of flecainide and she developed severe abdominal pin within one hour and was in discomfort all night. She is not quite over the discomfort yet but much improved. She is being seen in the afib clinic for other options for she says she will not take anymore flecainide. It was discussed with Dr. Johney Frame and decided to start propafenone 150 mg tid and continue with all the other plans that Tereso Newcomer has pit into place.  Today, she denies symptoms of palpitations, chest pain, shortness of breath, orthopnea, PND, lower extremity edema, dizziness, presyncope, syncope, or neurologic sequela. The patient is tolerating medications without difficulties and is otherwise without complaint today.   Past Medical History  Diagnosis Date  . Hypertension   . GERD (gastroesophageal reflux disease)   . Arthritis     all over  . AC (acromioclavicular) joint bone spurs     spurs in shoulders with pain  . Complication of anesthesia   . PONV (postoperative nausea and vomiting)   . Hypothyroidism   . Asthma     from reflux  . Deaf   . Fibromyalgia   . Arrhythmia    Past Surgical History  Procedure Laterality Date  . Righr shoulder surgery  1999    spurs  . Wisdom tooth extraction    . Tonsillectomy    . Tubal ligation    . Thyroid surgery  1991  . Vericose surgery right leg  05-1998  . Eye  surgery      catarcts  . Esophagogastroduodenoscopy endoscopy      several times  . Colonoscopy  [id number]  . Colonoscopy with propofol  04/09/2012    Procedure: COLONOSCOPY WITH PROPOFOL;  Surgeon: Charolett Bumpers, MD;  Location: [facility] ENDOSCOPY;  Service: Endoscopy;  Laterality: N/A;  . Abdominal hysterectomy      partial  . Total shoulder arthroplasty Right 07/02/2012    Procedure: TOTAL SHOULDER ARTHROPLASTY;  Surgeon: Mable Paris, MD;  Location: [facility] OR;  Service: Orthopedics;  Laterality: Right;  Right total shoulder arthroplasty  . Cardioversion N/A 10/16/2014    Procedure: CARDIOVERSION;  Surgeon: Corky Crafts, MD;  Location: [facility] ENDOSCOPY;  Service: Cardiovascular;  Laterality: N/A;    Current Outpatient Prescriptions  Medication Sig Dispense Refill  . ALPRAZolam (XANAX) 0.25 MG tablet Take 0.25 mg by mouth at bedtime.     Marland Kitchen apixaban (ELIQUIS) 5 MG TABS tablet Take 1 tablet (5 mg total) by mouth 2 (two) times daily. 180 tablet 1  . atenolol (TENORMIN) 50 MG tablet Take 1 tablet by mouth  daily before breakfast 90 tablet 2  . Calcium Carbonate-Vitamin D (CALTRATE 600+D) 600-400 MG-UNIT per tablet Take 1 tablet by mouth daily.    . Cyanocobalamin (VITAMIN B-12) 5000 MCG SUBL Place 1 tablet under the tongue. For  nerve pain in feet and metabolism support    . cyclobenzaprine (FLEXERIL) 5 MG tablet Take 5 mg by mouth 3 (three) times daily as needed. Muscle spasms    . diphenhydrAMINE (BENADRYL) 25 mg capsule Take 25 mg by mouth every 6 (six) hours as needed. For itching    . furosemide (LASIX) 20 MG tablet TAKE 1 TABLET (20 MG TOTAL) BY MOUTH DAILY. 30 tablet 1  . gabapentin (NEURONTIN) 600 MG tablet Take 600 mg by mouth 3 (three) times daily.  6  . HYDROcodone-acetaminophen (NORCO/VICODIN) 5-325 MG per tablet TAKE 1 TABLET TWICE A DAY AS NEEDED FOR PAIN  0  . ipratropium (ATROVENT) 0.06 % nasal spray Place 1 spray into both nostrils daily as needed for rhinitis.     Marland Kitchen  KLOR-CON 8 MEQ tablet TAKE 1 TABLET (8 MEQ TOTAL) BY MOUTH DAILY. 30 tablet 1  . levothyroxine (SYNTHROID, LEVOTHROID) 112 MCG tablet TAKE 1 TABLET ONCE A DAY ORALLY 30 DAYS  11  . LORazepam (ATIVAN) 2 MG tablet Take 2 mg by mouth at bedtime as needed. For restful sleep    . montelukast (SINGULAIR) 10 MG tablet Take 10 mg by mouth daily before breakfast.     . omeprazole (PRILOSEC) 40 MG capsule Take 40 mg by mouth daily.     . promethazine (PHENERGAN) 25 MG tablet Take 25 mg by mouth daily as needed for nausea or vomiting.    . propafenone (RYTHMOL) 150 MG tablet Take 1 tablet (150 mg total) by mouth 3 (three) times daily. 90 tablet 1   No current facility-administered medications for this encounter.    Allergies  Allergen Reactions  . Aleve [Naproxen] Other (See Comments)    Stomach ache  . Bextra [Valdecoxib] Other (See Comments)    Stomach ache   . Biaxin [Clarithromycin] Other (See Comments)    Dizzy, blurred vision, achy stomach  . Doxycycline Other (See Comments)    Stomach ache  . Durabac [Apap-Salicyl-Phenyltolox-Caff] Nausea And Vomiting  . Estradiol Other (See Comments)    Hurt stomach  . Levaquin [Levofloxacin] Other (See Comments)    Dizzy, achy stomach, can't sleep  . Macrobid [Nitrofurantoin] Other (See Comments)    bloating  . Motrin [Ibuprofen] Other (See Comments)    Stomach ahce  . Oxaprozin Other (See Comments)    Hurt stomach  . Robaxin [Methocarbamol] Other (See Comments)    Hurt stomach  . Sulfa Antibiotics Other (See Comments)    Blister on large toe  . Topamax [Topiramate] Nausea And Vomiting  . Trazodone And Nefazodone Other (See Comments)    Weakness in legs, numbness in arms, funny feeling, rapid heart beat, loose focus    History   Social History  . Marital Status: Married    Spouse Name: N/A  . Number of Children: N/A  . Years of Education: N/A   Occupational History  . Not on file.   Social History Main Topics  . Smoking status:  Never Smoker   . Smokeless tobacco: Never Used  . Alcohol Use: No  . Drug Use: No  . Sexual Activity: Not on file   Other Topics Concern  . Not on file   Social History Narrative    Family History  Problem Relation Age of Onset  . Lung cancer Father   . Arrhythmia Father   . Arrhythmia Mother     ROS- All systems are reviewed and negative except as per the HPI above  Physical Exam: Filed Vitals:  12/02/14 1423  BP: 112/70    GEN- The patient is well appearing, alert and oriented x 3 today.   Head- normocephalic, atraumatic Eyes-  Sclera clear, conjunctiva pink Ears- deaf Oropharynx- clear Neck- supple, no JVP Lymph- no cervical lymphadenopathy Lungs- Clear to ausculation bilaterally, normal work of breathing Heart- Iregular rate and rhythm, no murmurs, rubs or gallops, PMI not laterally displaced GI- soft, NT, ND, + BS Extremities- no clubbing, cyanosis, or edema MS- no significant deformity or atrophy Skin- no rash or lesion Psych- euthymic mood, full affect Neuro- strength and sensation are intact  EKG- Afib at 84 bpm, Qrs 86 bpm, QTc 437 ms  Assessment and Plan: 1. Persistent symptomatic afib with intolerance of flecainide Stop flecainide and start propafenone 150 mg tid Continue with plans for cardioversion, and stress test as scheduled. F/u with [facility] as scheduled after cardioversion Continue apixaban without fail.   Pt was discussed and plan made in collaboration with Dr. Nehemiah Settle C. [facility] [address] [place], [place] [address] [phone]

## 2014-12-04 NOTE — Telephone Encounter (Signed)
New message     Pt c/o medication issue:  1. Name of Medication: flecainide 2. How are you currently taking this medication (dosage and times per day)? 150mg  3. Are you having a reaction (difficulty breathing--STAT)? no 4. What is your medication issue? Medication is causing pt to have stomach distress----cramps, gas, bloating.  She started medication on august 3rd. Please call and let her know what she can do for the weekend

## 2014-12-07 ENCOUNTER — Encounter (HOSPITAL_COMMUNITY): Payer: Self-pay | Admitting: Cardiovascular Disease

## 2014-12-07 ENCOUNTER — Other Ambulatory Visit: Payer: Self-pay

## 2014-12-07 ENCOUNTER — Ambulatory Visit (HOSPITAL_COMMUNITY)
Admission: RE | Admit: 2014-12-07 | Discharge: 2014-12-07 | Disposition: A | Discharge status: Home / Self Care | Payer: Medicare Other | Source: Ambulatory Visit | Attending: Nurse Practitioner | Admitting: Nurse Practitioner

## 2014-12-07 VITALS — BP 116/62 | HR 50 | Ht 68.0 in | Wt 183.4 lb

## 2014-12-07 DIAGNOSIS — K5900 Constipation, unspecified: Secondary | ICD-10-CM | POA: Diagnosis not present

## 2014-12-07 DIAGNOSIS — I481 Persistent atrial fibrillation: Secondary | ICD-10-CM | POA: Insufficient documentation

## 2014-12-07 NOTE — Patient Instructions (Addendum)
Your physician has recommended you make the following change in your medication:  1)Stop propaferone

## 2014-12-07 NOTE — Progress Notes (Signed)
Patient ID: Allison [name], female   DOB: [birth year], 64 y.o.   MRN: [id number]     Primary Care Physician: [name], MD Referring Physician: Tereso Newcomer, PA. Cardiology: Dr Filomena Jungling is a 64 y.o. female with a h/o PAF that recently had a successful cardioversion 6/17, with ERAF. When she is in afib, she feels lightheaded and weak. She was recently evaluated by Tereso Newcomer PA/Dr. Katrinka Blazing, due to being symptomatic from afib,and decision was made to start flecainide 50 mg bid and DCCV on Friday with stress test next week, no  known CAD.(see [name]'s note  as of 8/2). She is deaf and is here with a translator, but can read lips and talk well.  She took one dose of flecainide and she developed severe abdominal pain  within one hour and was in discomfort all night. She is not quite over the discomfort yet but much improved. She was seen in the afib clinic for other options for afib, for she said she would not take anymore flecainide. It was discussed with Dr. Johney Frame and decided to start propafenone 150 mg tid and continue with all the other plans that Tereso Newcomer put into place.  She underwent successful cardioversion 8/5, but returns today due to intolerance of Rythmol. She feels bloated and has not had a bowel movement for couple of days. She is crying and says she feels terrible on the drug and she rather have afib. Options are getting limited for AAD's. I feel that she would also have issues with multaq,  With main intolerance in my experience is GI complaints. If caught in SR she may be able to be started on sotalol as an outpt, but will have to allow washout period from Rythmol. She has 6 dogs and a sick husband and can't come not the hospital for tikosyn. Today, she is not in the frame of mind to discuss other antiarrythmic's, she just wants to stop Rythmol.  Today, she denies symptoms of palpitations, chest pain, shortness of breath, orthopnea, PND, lower extremity  edema, dizziness, presyncope, syncope, or neurologic sequela. The patient is tolerating medications without difficulties and is otherwise without complaint today.   Past Medical History  Diagnosis Date  . Hypertension   . GERD (gastroesophageal reflux disease)   . Arthritis     all over  . AC (acromioclavicular) joint bone spurs     spurs in shoulders with pain  . Complication of anesthesia   . PONV (postoperative nausea and vomiting)   . Hypothyroidism   . Asthma     from reflux  . Deaf   . Fibromyalgia   . Arrhythmia    Past Surgical History  Procedure Laterality Date  . Righr shoulder surgery  1999    spurs  . Wisdom tooth extraction    . Tonsillectomy    . Tubal ligation    . Thyroid surgery  1991  . Vericose surgery right leg  05-1998  . Eye surgery      catarcts  . Esophagogastroduodenoscopy endoscopy      several times  . Colonoscopy  [id number]  . Colonoscopy with propofol  04/09/2012    Procedure: COLONOSCOPY WITH PROPOFOL;  Surgeon: Charolett Bumpers, MD;  Location: [facility] ENDOSCOPY;  Service: Endoscopy;  Laterality: N/A;  . Abdominal hysterectomy      partial  . Total shoulder arthroplasty Right 07/02/2012    Procedure: TOTAL SHOULDER ARTHROPLASTY;  Surgeon: Mable Paris, MD;  Location: [facility] OR;  Service: Orthopedics;  Laterality: Right;  Right total shoulder arthroplasty  . Cardioversion N/A 10/16/2014    Procedure: CARDIOVERSION;  Surgeon: Corky Crafts, MD;  Location: [facility] ENDOSCOPY;  Service: Cardiovascular;  Laterality: N/A;  . Cardioversion N/A 12/04/2014    Procedure: CARDIOVERSION;  Surgeon: Thurmon Fair, MD;  Location: [facility] ENDOSCOPY;  Service: Cardiovascular;  Laterality: N/A;    Current Outpatient Prescriptions  Medication Sig Dispense Refill  . ALPRAZolam (XANAX) 0.25 MG tablet Take 0.25 mg by mouth at bedtime.     Marland Kitchen apixaban (ELIQUIS) 5 MG TABS tablet Take 1 tablet (5 mg total) by mouth 2 (two) times daily. 180 tablet 1  . atenolol (TENORMIN)  50 MG tablet Take 1 tablet by mouth  daily before breakfast 90 tablet 2  . Calcium Carbonate-Vitamin D (CALTRATE 600+D) 600-400 MG-UNIT per tablet Take 1 tablet by mouth daily.    . Cyanocobalamin (VITAMIN B-12) 5000 MCG SUBL Place 1 tablet under the tongue. For nerve pain in feet and metabolism support    . cyclobenzaprine (FLEXERIL) 5 MG tablet Take 5 mg by mouth 3 (three) times daily as needed. Muscle spasms    . diphenhydrAMINE (BENADRYL) 25 mg capsule Take 25 mg by mouth every 6 (six) hours as needed. For itching    . furosemide (LASIX) 20 MG tablet TAKE 1 TABLET (20 MG TOTAL) BY MOUTH DAILY. 30 tablet 1  . gabapentin (NEURONTIN) 600 MG tablet Take 600 mg by mouth 3 (three) times daily.  6  . HYDROcodone-acetaminophen (NORCO/VICODIN) 5-325 MG per tablet TAKE 1 TABLET TWICE A DAY AS NEEDED FOR PAIN  0  . ipratropium (ATROVENT) 0.06 % nasal spray Place 1 spray into both nostrils daily as needed for rhinitis.     Marland Kitchen KLOR-CON 8 MEQ tablet TAKE 1 TABLET (8 MEQ TOTAL) BY MOUTH DAILY. 30 tablet 1  . levothyroxine (SYNTHROID, LEVOTHROID) 112 MCG tablet TAKE 1 TABLET ONCE A DAY ORALLY 30 DAYS  11  . LORazepam (ATIVAN) 2 MG tablet Take 2 mg by mouth at bedtime as needed. For restful sleep    . montelukast (SINGULAIR) 10 MG tablet Take 10 mg by mouth daily before breakfast.     . omeprazole (PRILOSEC) 40 MG capsule Take 40 mg by mouth daily.     . promethazine (PHENERGAN) 25 MG tablet Take 25 mg by mouth daily as needed for nausea or vomiting.    Marland Kitchen oxyCODONE-acetaminophen (PERCOCET/ROXICET) 5-325 MG per tablet Take 1 tablet by mouth 2 (two) times daily as needed.  0  . tiZANidine (ZANAFLEX) 2 MG tablet Take 2 mg by mouth as needed.     No current facility-administered medications for this encounter.    Allergies  Allergen Reactions  . Aleve [Naproxen] Other (See Comments)    Stomach ache  . Bextra [Valdecoxib] Other (See Comments)    Stomach ache   . Biaxin [Clarithromycin] Other (See Comments)      Dizzy, blurred vision, achy stomach  . Doxycycline Other (See Comments)    Stomach ache  . Durabac [Apap-Salicyl-Phenyltolox-Caff] Nausea And Vomiting  . Estradiol Other (See Comments)    Hurt stomach  . Levaquin [Levofloxacin] Other (See Comments)    Dizzy, achy stomach, can't sleep  . Macrobid [Nitrofurantoin] Other (See Comments)    bloating  . Motrin [Ibuprofen] Other (See Comments)    Stomach ahce  . Oxaprozin Other (See Comments)    Hurt stomach  . Robaxin [Methocarbamol] Other (See Comments)    Hurt stomach  . Sulfa Antibiotics  Other (See Comments)    Blister on large toe  . Topamax [Topiramate] Nausea And Vomiting  . Trazodone And Nefazodone Other (See Comments)    Weakness in legs, numbness in arms, funny feeling, rapid heart beat, loose focus    History   Social History  . Marital Status: Married    Spouse Name: N/A  . Number of Children: N/A  . Years of Education: N/A   Occupational History  . Not on file.   Social History Main Topics  . Smoking status: Never Smoker   . Smokeless tobacco: Never Used  . Alcohol Use: No  . Drug Use: No  . Sexual Activity: Not on file   Other Topics Concern  . Not on file   Social History Narrative    Family History  Problem Relation Age of Onset  . Lung cancer Father   . Arrhythmia Father   . Arrhythmia Mother     ROS- All systems are reviewed and negative except as per the HPI above  Physical Exam: Filed Vitals:   12/07/14 1106  BP: 116/62  Pulse: 50  Height:  (1.727 m)  Weight: 183 lb 6.4 oz (83.19 kg)    GEN- The patient is well appearing, alert and oriented x 3 today.   Head- normocephalic, atraumatic Eyes-  Sclera clear, conjunctiva pink Ears- deaf Oropharynx- clear Neck- supple, no JVP Lymph- no cervical lymphadenopathy Lungs- Clear to ausculation bilaterally, normal work of breathing Heart- Iregular rate and rhythm, no murmurs, rubs or gallops, PMI not laterally displaced GI- soft, NT,  ND, + BS Extremities- no clubbing, cyanosis, or edema MS- no significant deformity or atrophy Skin- no rash or lesion Psych- euthymic mood, full affect Neuro- strength and sensation are intact  EKG- Sinus brady, Pr int 174 ms, QRS 96 ms, Qtc . Epic records reviewed.  Assessment and Plan: 1. Persistent symptomatic afib with intolerance of flecainide/rythmol Stop propafenone. She is aware that afib likely to return. At this point, she is willing to accept afib due to feeling so bad with bloating, constipation . Will cancel stress test that was set up due to not continuing use of 1c agents.   F/u with Tereso Newcomer as previously scheduled 8/17. Will try to obtain f/u with Dr. Eldridge Dace as well.  Elvina Sidle [facility] [address] [place], [place] [address] [phone]

## 2014-12-08 NOTE — Telephone Encounter (Signed)
I spoke with Allison [name] regarding the process of scheduling interpreters for procedures/office visit.  I relayed thru the TDD operator this is handled thru the Office of Inclusion. I explained her record in EP[employer] is flagged to request an interpreter each time and office appointment/procedure is scheduled and is sent to the Office of Inclusion via a workqueue.  She asked if these requests were followed up and I told her no.  She did state that maybe that procedure should change.  She then started to talk about her stress test being cancelled and the problem she was having with her medications.  I ask her if she wanted me to contact the office at [facility] to have them call and she said someone would call her later.  Our conversation ended at that point.  Triage may want to follow up with this patient regarding her medications.

## 2014-12-08 NOTE — Telephone Encounter (Signed)
Left message to call back  

## 2014-12-08 NOTE — Telephone Encounter (Signed)
Called patient's phone number thru TDD operator to explain the process of having interpreters scheduled for procedures/office visits.  No answer--will keep trying until patient is reached.

## 2014-12-08 NOTE — Telephone Encounter (Signed)
Received call thru TDD operator.  Pt cancelled her nuclear stress test as she feels it is unnecessary.  I check pt's chart and saw she was on flecainide.  I had operator relay to pt that I would send a message to Dr. Hoyle Barr nurse to have her call pt.  Also had operator explain that since pt was started on flecainide she definitely needed this test.

## 2014-12-08 NOTE — Telephone Encounter (Signed)
Spoke with pt through interpreter line and pt stated that Rudi Coco, NP seen pt yesterday and informed her that she did not need to have the stress test done because she is no longer taking the Flecanide. From Darvin Neighbours note: Assessment and Plan: 1. Persistent symptomatic afib with intolerance of flecainide/rythmol Stop propafenone. She is aware that afib likely to return. At this point, she is willing to accept afib due to feeling so bad with bloating, constipation . Will cancel stress test that was set up due to not continuing use of 1c agents.  Pt wanted to make sure that appt was cancelled and that interpreter was cancelled.  Spoke with rep from interpreter service and she stated that it actually had not been scheduled to begin with.  Pt also wanted to make sure her insurance would not be charged.  Spoke with Charmaine in billing and she said since the appt was cancelled then she would not be charged and the pre cert would still be viable in the event this needs to be rescheduled in the near future.  Pt wanted to know whether or not to keep f/u on 8/17 with Tereso Newcomer, PA-C.  Advised pt to keep this appt. Pt communicated understanding and was in agreement with this plan.

## 2014-12-09 ENCOUNTER — Other Ambulatory Visit: Payer: Self-pay | Admitting: Interventional Cardiology

## 2014-12-11 ENCOUNTER — Encounter (HOSPITAL_COMMUNITY): Payer: Medicare Other

## 2014-12-14 MED ORDER — PROPOFOL 10 MG/ML IV BOLUS
INTRAVENOUS | Status: DC | PRN
  Administered 2014-10-16: 70 mg via INTRAVENOUS

## 2014-12-14 NOTE — Addendum Note (Signed)
Addendum  created 12/14/14 1204 by Carmela Rima, CRNA   Modules edited: Anesthesia Medication Administration

## 2014-12-15 NOTE — Progress Notes (Signed)
Cardiology Office Note   Date:  12/16/2014   ID:  DAWT REEB, DOB 07/27/1950, MRN [id number]  PCP:  [name], MD  Cardiologist:  Dr. Everette Rank   Electrophysiologist:  n/a  Chief Complaint  Patient presents with  . Atrial Fibrillation     History of Present Illness: Allison [name] is a 64 y.o. female with a hx of deafness, paroxysmal atrial fibrillation with associated hyperthyroidism several years ago, HTN, fibromyalgia, asthma, GERD.   She was noted to be back in atrial fibrillation when she saw Dr. Eldridge Dace in May. She was placed on Eliquis and set up for cardioversion. Last seen by Dr. Eldridge Dace 11/24/14. She was maintaining NSR after cardioversion. She walked into the office 12/01/14 with complaints of dizziness and an EKG confirmed recurrent atrial fibrillation. She felt poorly with this including dizziness and near syncope at times and increased DOE.  We put her on Flecainide and planned a DCCV.  But, she had significant symptoms after her first dose of Flecainide. She had severe abdominal pain.  She was referred to the AFib Clinic.  She was seen by Rudi Coco, NP and Dr. Johney Frame.  She was switched to Propafenone 150 mg TID.  She underwent DCCV as previously arranged on 12/04/14.  She had return of NSR.  She was seen back in the AFib Clinic 8/8 and noted abdominal bloating and constipation from the Propafenone.  This drug was also stopped. It was not felt that she would be a good candidate for Multaq.  She would require a washout period after stopping Propafenone before Sotalol could be considered (it would have to be started while in NSR as an OP).  She noted difficulty planning an admission for Tikosyn Rx.  Therefore, a decision was made to stop Propafenone without any FU planned.    She returns for FU as previously scheduled when I last saw her.  She is here with an interpreter. She is feeling well.  The patient denies chest pain, shortness of breath, syncope, orthopnea,  PND or significant pedal edema.     Studies/Reports Reviewed Today:  Echo 10/22/13:  EF 55-60%, normal wall motion, grade 1 diastolic dysfunction, mild AI, trivial MR, mild LAE, normal RV function, PASP 21 mmHg  Myoview 06/25/12  normal perfusion, EF 67%, low risk     Past Medical History  Diagnosis Date  . Hypertension   . GERD (gastroesophageal reflux disease)   . Arthritis     all over  . AC (acromioclavicular) joint bone spurs     spurs in shoulders with pain  . Complication of anesthesia   . PONV (postoperative nausea and vomiting)   . Hypothyroidism   . Asthma     from reflux  . Deaf   . Fibromyalgia   . Arrhythmia     Past Surgical History  Procedure Laterality Date  . Righr shoulder surgery  1999    spurs  . Wisdom tooth extraction    . Tonsillectomy    . Tubal ligation    . Thyroid surgery  1991  . Vericose surgery right leg  05-1998  . Eye surgery      catarcts  . Esophagogastroduodenoscopy endoscopy      several times  . Colonoscopy  [id number]  . Colonoscopy with propofol  04/09/2012    Procedure: COLONOSCOPY WITH PROPOFOL;  Surgeon: Charolett Bumpers, MD;  Location: [facility] ENDOSCOPY;  Service: Endoscopy;  Laterality: N/A;  . Abdominal hysterectomy      partial  .  Total shoulder arthroplasty Right 07/02/2012    Procedure: TOTAL SHOULDER ARTHROPLASTY;  Surgeon: Mable Paris, MD;  Location: [facility] OR;  Service: Orthopedics;  Laterality: Right;  Right total shoulder arthroplasty  . Cardioversion N/A 10/16/2014    Procedure: CARDIOVERSION;  Surgeon: Corky Crafts, MD;  Location: [facility] ENDOSCOPY;  Service: Cardiovascular;  Laterality: N/A;  . Cardioversion N/A 12/04/2014    Procedure: CARDIOVERSION;  Surgeon: Thurmon Fair, MD;  Location: [facility] ENDOSCOPY;  Service: Cardiovascular;  Laterality: N/A;     Current Outpatient Prescriptions  Medication Sig Dispense Refill  . ALPRAZolam (XANAX) 0.25 MG tablet Take 0.25 mg by mouth at bedtime.     Marland Kitchen apixaban  (ELIQUIS) 5 MG TABS tablet Take 1 tablet (5 mg total) by mouth 2 (two) times daily. 180 tablet 1  . atenolol (TENORMIN) 50 MG tablet Take 1 tablet by mouth  daily before breakfast 90 tablet 2  . Calcium Carbonate-Vitamin D (CALTRATE 600+D) 600-400 MG-UNIT per tablet Take 1 tablet by mouth daily.    . Cyanocobalamin (VITAMIN B-12) 5000 MCG SUBL Place 1 tablet under the tongue. For nerve pain in feet and metabolism support    . cyclobenzaprine (FLEXERIL) 5 MG tablet Take 5 mg by mouth 3 (three) times daily as needed. Muscle spasms    . diphenhydrAMINE (BENADRYL) 25 mg capsule Take 25 mg by mouth every 6 (six) hours as needed. For itching    . furosemide (LASIX) 20 MG tablet TAKE 1 TABLET (20 MG TOTAL) BY MOUTH DAILY. 30 tablet 1  . gabapentin (NEURONTIN) 600 MG tablet Take 600 mg by mouth 3 (three) times daily.  6  . HYDROcodone-acetaminophen (NORCO/VICODIN) 5-325 MG per tablet TAKE 1 TABLET TWICE A DAY AS NEEDED FOR PAIN  0  . KLOR-CON 8 MEQ tablet TAKE 1 TABLET (8 MEQ TOTAL) BY MOUTH DAILY. 30 tablet 1  . levothyroxine (SYNTHROID, LEVOTHROID) 112 MCG tablet TAKE 1 TABLET ONCE A DAY ORALLY 30 DAYS  11  . LORazepam (ATIVAN) 2 MG tablet Take 2 mg by mouth at bedtime as needed. For restful sleep    . montelukast (SINGULAIR) 10 MG tablet Take 10 mg by mouth daily before breakfast.     . omeprazole (PRILOSEC) 40 MG capsule Take 40 mg by mouth daily.     . promethazine (PHENERGAN) 25 MG tablet Take 25 mg by mouth daily as needed for nausea or vomiting.    Marland Kitchen tiZANidine (ZANAFLEX) 2 MG tablet Take 2 mg by mouth daily as needed for muscle spasms.      No current facility-administered medications for this visit.    Allergies:   Aleve; Bextra; Biaxin; Doxycycline; Durabac; Estradiol; Levaquin; Macrobid; Motrin; Oxaprozin; Robaxin; Sulfa antibiotics; Topamax; and Trazodone and nefazodone    Social History:  The patient  reports that she has never smoked. She has never used smokeless tobacco. She reports  that she does not drink alcohol or use illicit drugs.   Family History:  The patient's family history includes Arrhythmia in her father and mother; Lung cancer in her father.    ROS:   Please see the history of present illness.   Review of Systems  All other systems reviewed and are negative.     PHYSICAL EXAM: VS:  BP 118/58 mmHg  Pulse 55  Ht 5\' 8"  (1.727 m)  Wt 181 lb 12.8 oz (82.464 kg)  BMI 27.65 kg/m2      Wt Readings from Last 3 Encounters:  12/16/14 181 lb 12.8 oz (82.464 kg)  12/07/14 183 lb 6.4 oz (83.19 kg)  12/04/14 181 lb (82.101 kg)     GEN: Well nourished, well developed, in no acute distress HEENT: normal Neck: no JVD,  no masses Cardiac:  Normal S1/S2, RRR; no murmur ,  no rubs or gallops, no edema   Respiratory:  clear to auscultation bilaterally, no wheezing, rhonchi or rales. GI: soft, nontender, nondistended, + BS MS: no deformity or atrophy Skin: warm and dry  Neuro:  CNs II-XII intact, Strength and sensation are intact Psych: Normal affect   EKG:  EKG is ordered today.  It demonstrates:  Sinus bradycardia, HR 55, PR interval 152 ms, QTC 417 ms   Recent Labs: 12/01/2014: BUN 14; Creatinine, Ser 1.26*; Hemoglobin 14.9; Platelets 327.0; Potassium 3.9; Sodium 141; TSH 1.30    Lipid Panel No results found for: CHOL, TRIG, HDL, CHOLHDL, VLDL, LDLCALC, LDLDIRECT    ASSESSMENT AND PLAN:   1.  Paroxysmal Atrial Fibrillation:   She is maintaining NSR. She is tolerating anticoagulation with apixaban. Continue current therapy. I tried to describe to her how to take when necessary atenolol if she should feel as though she were back in atrial fibrillation. If she feels that her heart rate is increased at a sustained rate, she can try an extra one half tablet of atenolol. If she has recurrent atrial fibrillation, consider referral back to the Atrial Fibrillation Clinic. Question if she would be a candidate for ablation. Could consider trying to start Sotalol  at some point as well. She tolerates atenolol, so I suspect she would be able to tolerate Sotalol. 2.  Hypertension:  She is no longer dizzy.  I cut back on her Lasix last time.  Her Creatinine was slightly higher.  Repeat BMET is pending today.    3.  Hypothyroidism:  Recent TSH ok.     Medication Changes: Current medicines are reviewed at length with the patient today.  Concerns regarding medicines are as outlined above.  The following changes have been made:   Discontinued Medications   IPRATROPIUM (ATROVENT) 0.06 % NASAL SPRAY    Place 1 spray into both nostrils daily as needed for rhinitis.    OXYCODONE-ACETAMINOPHEN (PERCOCET/ROXICET) 5-325 MG PER TABLET    Take 1 tablet by mouth 2 (two) times daily as needed.   Modified Medications   No medications on file   New Prescriptions   No medications on file    Labs/ tests ordered today include:   Orders Placed This Encounter  Procedures  . Basic Metabolic Panel (BMET)  . EKG 12-Lead     Disposition:   FU with Dr. Everette Rank 2 weeks.    Signed, Brynda Rim, MHS 12/16/2014 12:46 PM    [facility] [address] [place], [place], [place]  [address] Phone: [phone]; Fax: [phone]

## 2014-12-16 ENCOUNTER — Ambulatory Visit (INDEPENDENT_AMBULATORY_CARE_PROVIDER_SITE_OTHER): Payer: Medicare Other | Admitting: Physician Assistant

## 2014-12-16 ENCOUNTER — Telehealth: Payer: Self-pay | Admitting: *Deleted

## 2014-12-16 ENCOUNTER — Encounter: Payer: Self-pay | Admitting: Physician Assistant

## 2014-12-16 VITALS — BP 118/58 | HR 55 | Ht 68.0 in | Wt 181.8 lb

## 2014-12-16 DIAGNOSIS — E039 Hypothyroidism, unspecified: Secondary | ICD-10-CM

## 2014-12-16 DIAGNOSIS — I10 Essential (primary) hypertension: Secondary | ICD-10-CM

## 2014-12-16 DIAGNOSIS — I480 Paroxysmal atrial fibrillation: Secondary | ICD-10-CM

## 2014-12-16 LAB — BASIC METABOLIC PANEL
BUN: 12 mg/dL (ref 6–23)
CHLORIDE: 103 meq/L (ref 96–112)
CO2: 28 mEq/L (ref 19–32)
Calcium: 9.7 mg/dL (ref 8.4–10.5)
Creatinine, Ser: 1.03 mg/dL (ref 0.40–1.20)
GFR: 56.99 mL/min — AB (ref >60.00)
GLUCOSE: 83 mg/dL (ref 70–99)
POTASSIUM: 4.5 meq/L (ref 3.5–5.1)
Sodium: 139 mEq/L (ref 135–145)

## 2014-12-16 NOTE — Telephone Encounter (Signed)
Called pt with results; Pt is deaf so her interpretor relayed results by sign language for the pt. Pt gave understanding through interpreter and said we we very fast and efficient.

## 2014-12-16 NOTE — Patient Instructions (Signed)
Medication Instructions:  Your physician recommends that you continue on your current medications as directed. Please refer to the Current Medication list given to you today.   Labwork: TODAY  BMET  Testing/Procedures: NONE  Follow-Up: 3 MONTHS WITH DR. VARANASI  Any Other Special Instructions Will Be Listed Below (If Applicable).

## 2014-12-27 ENCOUNTER — Other Ambulatory Visit: Payer: Self-pay | Admitting: Interventional Cardiology

## 2015-02-21 ENCOUNTER — Other Ambulatory Visit: Payer: Self-pay | Admitting: Cardiology

## 2015-03-30 ENCOUNTER — Ambulatory Visit (INDEPENDENT_AMBULATORY_CARE_PROVIDER_SITE_OTHER): Payer: Medicare Other | Admitting: Interventional Cardiology

## 2015-03-30 ENCOUNTER — Encounter: Payer: Self-pay | Admitting: Interventional Cardiology

## 2015-03-30 VITALS — BP 130/62 | HR 52 | Ht 68.0 in | Wt 187.0 lb

## 2015-03-30 DIAGNOSIS — I480 Paroxysmal atrial fibrillation: Secondary | ICD-10-CM

## 2015-03-30 DIAGNOSIS — I4891 Unspecified atrial fibrillation: Secondary | ICD-10-CM

## 2015-03-30 DIAGNOSIS — E039 Hypothyroidism, unspecified: Secondary | ICD-10-CM | POA: Diagnosis not present

## 2015-03-30 NOTE — Patient Instructions (Signed)
Medication Instructions:  Same-no changes  Labwork: None  Testing/Procedures: None  Follow-Up: Your physician wants you to follow-up in: 6 months. You will receive a reminder letter in the mail two months in advance. If you don't receive a letter, please call our office to schedule the follow-up appointment.      If you need a refill on your cardiac medications before your next appointment, please call your pharmacy.   

## 2015-03-30 NOTE — Progress Notes (Signed)
Patient ID: Allison [name], female   DOB: 14-Feb-1951, 64 y.o.   MRN: [id number]     Cardiology Office Note   Date:  03/30/2015   ID:  Allison [name], DOB 01-May-1951, MRN [id number]  PCP:  [name], MD    No chief complaint on file. f/u AFib   Wt Readings from Last 3 Encounters:  03/30/15 187 lb (84.823 kg)  12/16/14 181 lb 12.8 oz (82.464 kg)  12/07/14 183 lb 6.4 oz (83.19 kg)       History of Present Illness: SHARAE [name] is a 64 y.o. female   with a hx of deafness, paroxysmal atrial fibrillation with associated hyperthyroidism several years ago, HTN, fibromyalgia, asthma, GERD.   She was noted to be back in atrial fibrillation in May 2016. She was placed on Eliquis and set up for cardioversion. She was maintaining NSR after cardioversion. She walked into the office 12/01/14 with complaints of dizziness and an EKG confirmed recurrent atrial fibrillation. She felt poorly with this including dizziness and near syncope at times and increased DOE. We put her on Flecainide and planned a DCCV. But, she had significant symptoms after her first dose of Flecainide. She had severe abdominal pain. She was referred to the AFib Clinic. She was seen by Rudi Coco, NP and Dr. Johney Frame. She was switched to Propafenone 150 mg TID. She underwent DCCV as previously arranged on 12/04/14. She had return of NSR. She was seen back in the AFib Clinic 8/8 and noted abdominal bloating and constipation from the Propafenone. This drug was also stopped. It was not felt that she would be a good candidate for Multaq. She would require a washout period after stopping Propafenone before Sotalol could be considered (it would have to be started while in NSR as an OP per [employer] note). She noted difficulty planning an admission for Tikosyn Rx. Therefore, a decision was made to stop Propafenone without any FU planned.   She returns for FU as previously scheduled when I last saw her. She is here  with an interpreter. She is feeling well. The patient denies chest pain, shortness of breath, syncope, orthopnea, PND or significant pedal edema.   She has stress over her mother's illness.   No bleeding issues other than when she cuts her toenails.  No blood in her stool.  She fell after tripping.  No loss of consciousness.  She is more careful to avoid falling.    Past Medical History  Diagnosis Date  . Hypertension   . GERD (gastroesophageal reflux disease)   . Arthritis     all over  . AC (acromioclavicular) joint bone spurs     spurs in shoulders with pain  . Complication of anesthesia   . PONV (postoperative nausea and vomiting)   . Hypothyroidism   . Asthma     from reflux  . Deaf   . Fibromyalgia   . Arrhythmia     Past Surgical History  Procedure Laterality Date  . Righr shoulder surgery  1999    spurs  . Wisdom tooth extraction    . Tonsillectomy    . Tubal ligation    . Thyroid surgery  1991  . Vericose surgery right leg  05-1998  . Eye surgery      catarcts  . Esophagogastroduodenoscopy endoscopy      several times  . Colonoscopy  [id number]  . Colonoscopy with propofol  04/09/2012    Procedure: COLONOSCOPY WITH PROPOFOL;  Surgeon: Jone Baseman  Laural Benes, MD;  Location: [facility] ENDOSCOPY;  Service: Endoscopy;  Laterality: N/A;  . Abdominal hysterectomy      partial  . Total shoulder arthroplasty Right 07/02/2012    Procedure: TOTAL SHOULDER ARTHROPLASTY;  Surgeon: Mable Paris, MD;  Location: [facility] OR;  Service: Orthopedics;  Laterality: Right;  Right total shoulder arthroplasty  . Cardioversion N/A 10/16/2014    Procedure: CARDIOVERSION;  Surgeon: Corky Crafts, MD;  Location: [facility] ENDOSCOPY;  Service: Cardiovascular;  Laterality: N/A;  . Cardioversion N/A 12/04/2014    Procedure: CARDIOVERSION;  Surgeon: Thurmon Fair, MD;  Location: [facility] ENDOSCOPY;  Service: Cardiovascular;  Laterality: N/A;     Current Outpatient Prescriptions  Medication Sig Dispense  Refill  . ALPRAZolam (XANAX) 0.25 MG tablet Take 0.25 mg by mouth at bedtime.     Marland Kitchen apixaban (ELIQUIS) 5 MG TABS tablet Take 1 tablet (5 mg total) by mouth 2 (two) times daily. 180 tablet 1  . atenolol (TENORMIN) 50 MG tablet Take 1 tablet by mouth  daily before breakfast 90 tablet 2  . Calcium Carbonate-Vitamin D (CALTRATE 600+D) 600-400 MG-UNIT per tablet Take 1 tablet by mouth daily.    . Cyanocobalamin (VITAMIN B-12) 5000 MCG SUBL Place 1 tablet under the tongue. For nerve pain in feet and metabolism support    . cyclobenzaprine (FLEXERIL) 5 MG tablet Take 5 mg by mouth 3 (three) times daily as needed. Muscle spasms    . diphenhydrAMINE (BENADRYL) 25 mg capsule Take 25 mg by mouth every 6 (six) hours as needed. For itching    . furosemide (LASIX) 20 MG tablet TAKE 1 TABLET (20 MG TOTAL) BY MOUTH DAILY. 30 tablet 5  . gabapentin (NEURONTIN) 600 MG tablet Take 600 mg by mouth 3 (three) times daily.  6  . HYDROcodone-acetaminophen (NORCO/VICODIN) 5-325 MG per tablet TAKE 1 TABLET TWICE A DAY AS NEEDED FOR PAIN  0  . KLOR-CON 8 MEQ tablet TAKE 1 TABLET (8 MEQ TOTAL) BY MOUTH DAILY. 30 tablet 5  . levothyroxine (SYNTHROID, LEVOTHROID) 112 MCG tablet TAKE 1 TABLET ONCE A DAY ORALLY 30 DAYS  11  . LORazepam (ATIVAN) 2 MG tablet Take 2 mg by mouth at bedtime as needed. For restful sleep    . montelukast (SINGULAIR) 10 MG tablet Take 10 mg by mouth daily before breakfast.     . omeprazole (PRILOSEC) 40 MG capsule Take 40 mg by mouth daily.     . promethazine (PHENERGAN) 25 MG tablet Take 25 mg by mouth daily as needed for nausea or vomiting.    Marland Kitchen tiZANidine (ZANAFLEX) 2 MG tablet Take 2 mg by mouth daily as needed for muscle spasms.     . traMADol (ULTRAM) 50 MG tablet as needed.     No current facility-administered medications for this visit.    Allergies:   Aleve; Bextra; Biaxin; Doxycycline; Durabac; Estradiol; Levaquin; Macrobid; Motrin; Oxaprozin; Robaxin; Sulfa antibiotics; Topamax; and  Trazodone and nefazodone    Social History:  The patient  reports that she has never smoked. She has never used smokeless tobacco. She reports that she does not drink alcohol or use illicit drugs.   Family History:  The patient's family history includes Arrhythmia in her father and mother; Heart attack in her maternal uncle; Hypertension in her father, maternal grandmother, and mother; Kidney disease in her paternal grandfather; Lung cancer in her father; Stroke in her maternal grandmother.    ROS:  Please see the history of present illness.   Otherwise, review of systems  are positive for anxiety.   All other systems are reviewed and negative.    PHYSICAL EXAM: VS:  BP 130/62 mmHg  Pulse 52  Ht  (1.727 m)  Wt 187 lb (84.823 kg)  BMI 28.44 kg/m2 , BMI Body mass index is 28.44 kg/(m^2). GEN: Well nourished, well developed, in no acute distress HEENT: normal Neck: no JVD, carotid bruits, or masses Cardiac: RRR; no murmurs, rubs, or gallops,no edema  Respiratory:  clear to auscultation bilaterally, normal work of breathing GI: soft, nontender, nondistended, + BS MS: no deformity or atrophy Skin: warm and dry, no rash Neuro:  Strength and sensation are intact Psych: euthymic mood, full affect   EKG:   The ekg ordered today demonstrates sinus bradycardia, PRWP, no ST segment changes   Recent Labs: 12/01/2014: Hemoglobin 14.9; Platelets 327.0; TSH 1.30 12/16/2014: BUN 12; Creatinine, Ser 1.03; Potassium 4.5; Sodium 139   Lipid Panel No results found for: CHOL, TRIG, HDL, CHOLHDL, VLDL, LDLCALC, LDLDIRECT   Other studies Reviewed: Additional studies/ records that were reviewed today with results demonstrating: .   ASSESSMENT AND PLAN:  1. AFib: Maintaining sinus rhythm. She did not tolerate flecainide or per path unknown due to side effects. She was thought not to be a good candidate for multiple back. Hopefully, she will maintain sinus rhythm on just rate slowing drugs. If  she has refractory atrial fibrillation, will need to get electrophysiology input regarding antiarrhythmic. Sotalol would be a possibility. 2. History of hyperthyroidism: Now hypothyroid. Synthroid. 3. Continue Eliquis for stroke prevention.   Current medicines are reviewed at length with the patient today.  The patient concerns regarding her medicines were addressed.  The following changes have been made:  No change  Labs/ tests ordered today include:  No orders of the defined types were placed in this encounter.    Recommend 150 minutes/week of aerobic exercise Low fat, low carb, high fiber diet recommended  Disposition:   FU in 6 months   Delorise Jackson., MD  03/30/2015 11:16 AM    [facility] [address] [place], [place], [place]  [address] Phone: [phone]; Fax: [phone]

## 2015-04-01 ENCOUNTER — Telehealth: Payer: Self-pay | Admitting: Interventional Cardiology

## 2015-04-01 NOTE — Telephone Encounter (Signed)
Pt c/o swelling: STAT is pt has developed SOB within 24 hours  1. How long have you been experiencing swelling? Don't know  2. Where is the swelling located? Both ankles  3.  Are you currently taking a "fluid pill"?yes  4.  Are you currently SOB? no  5.  Have you traveled recently?No

## 2015-04-01 NOTE — Telephone Encounter (Signed)
**Note De-Identified  Obfuscation** The pt had an OV with Dr Eldridge Dace 2 days ago and had no swelling at that time. She states that she took a Diclofenac dose since her OV on 11/29 due to arthritis pain and that she rarely takes them due to side effects which include swelling. She is advised to contact her PCP to discuss her swelling and other medications she can take in stead. She verbalized understanding and thanked me for calling her back.Allison [name]

## 2015-05-03 ENCOUNTER — Other Ambulatory Visit: Payer: Self-pay | Admitting: Interventional Cardiology

## 2015-05-06 ENCOUNTER — Telehealth: Payer: Self-pay | Admitting: Interventional Cardiology

## 2015-05-06 NOTE — Telephone Encounter (Signed)
Patient calling the office for samples of medication:   1.  What medication and dosage are you requesting samples for?Eliquis 5 mg   2.  Are you currently out of this medication? yes   Pt is deaf and said that she would take her chances coming up to the office, since she would be in the area and that she would not be able to take the phone call indicating that samples would be ready for her. Please advise.

## 2015-05-06 NOTE — Telephone Encounter (Signed)
Called pt and left message informing pt that 2 boxes of samples of Eliquis 5 mg tablets were left at the front desk for pt to pick up and if she has any other problems, questions or concerns to call the office.

## 2015-05-25 ENCOUNTER — Other Ambulatory Visit: Payer: Self-pay

## 2015-05-25 MED ORDER — APIXABAN 5 MG PO TABS: 5.0000 mg | ORAL_TABLET | Freq: Two times a day (BID) | ORAL | Status: DC

## 2015-05-27 ENCOUNTER — Telehealth: Payer: Self-pay

## 2015-05-27 NOTE — Telephone Encounter (Signed)
Patient Assistance forms for Eliquis faxed to [facility] [employer].

## 2015-06-01 ENCOUNTER — Telehealth: Payer: Self-pay | Admitting: Interventional Cardiology

## 2015-06-01 NOTE — Telephone Encounter (Signed)
Pt wants to know if any decision have been made whether or not  She can get help with her Eliquis. Please leave a detailed message if she is not there.

## 2015-06-02 NOTE — Telephone Encounter (Signed)
Left message with patient's husband that I will call back Thursday or Friday. She has been denied for PA (assistance) with Eliquis

## 2015-06-29 ENCOUNTER — Other Ambulatory Visit: Payer: Self-pay | Admitting: Interventional Cardiology

## 2015-08-16 ENCOUNTER — Other Ambulatory Visit: Payer: Self-pay | Admitting: Cardiology

## 2015-08-16 ENCOUNTER — Other Ambulatory Visit: Payer: Self-pay | Admitting: Internal Medicine

## 2015-08-16 DIAGNOSIS — Z1231 Encounter for screening mammogram for malignant neoplasm of breast: Secondary | ICD-10-CM

## 2015-08-17 NOTE — Telephone Encounter (Signed)
Rx request sent to pharmacy.  

## 2015-09-02 ENCOUNTER — Ambulatory Visit: Payer: Medicare Other

## 2015-09-22 ENCOUNTER — Ambulatory Visit: Payer: Medicare Other | Admitting: Interventional Cardiology

## 2015-10-10 ENCOUNTER — Other Ambulatory Visit: Payer: Self-pay | Admitting: Cardiology

## 2015-10-11 ENCOUNTER — Other Ambulatory Visit: Payer: Self-pay | Admitting: *Deleted

## 2015-10-11 MED ORDER — FUROSEMIDE 20 MG PO TABS: 20.0000 mg | ORAL_TABLET | Freq: Every day | ORAL | Status: DC

## 2015-10-11 MED ORDER — POTASSIUM CHLORIDE ER 8 MEQ PO TBCR: 8.0000 meq | EXTENDED_RELEASE_TABLET | Freq: Every day | ORAL | Status: DC

## 2015-10-12 ENCOUNTER — Other Ambulatory Visit: Payer: Self-pay | Admitting: Interventional Cardiology

## 2015-12-04 ENCOUNTER — Other Ambulatory Visit: Payer: Self-pay | Admitting: Interventional Cardiology

## 2015-12-22 ENCOUNTER — Ambulatory Visit
Admission: RE | Admit: 2015-12-22 | Discharge: 2015-12-22 | Disposition: A | Discharge status: Home / Self Care | Payer: Medicare Other | Source: Ambulatory Visit | Attending: Internal Medicine | Admitting: Internal Medicine

## 2015-12-22 DIAGNOSIS — Z1231 Encounter for screening mammogram for malignant neoplasm of breast: Secondary | ICD-10-CM

## 2016-01-06 ENCOUNTER — Encounter: Payer: Self-pay | Admitting: Interventional Cardiology

## 2016-01-20 ENCOUNTER — Ambulatory Visit (INDEPENDENT_AMBULATORY_CARE_PROVIDER_SITE_OTHER): Payer: Medicare Other | Admitting: Interventional Cardiology

## 2016-01-20 ENCOUNTER — Encounter: Payer: Self-pay | Admitting: Interventional Cardiology

## 2016-01-20 VITALS — BP 110/60 | HR 67 | Ht 68.0 in | Wt 205.0 lb

## 2016-01-20 DIAGNOSIS — R0602 Shortness of breath: Secondary | ICD-10-CM | POA: Diagnosis not present

## 2016-01-20 DIAGNOSIS — Z7901 Long term (current) use of anticoagulants: Secondary | ICD-10-CM | POA: Diagnosis not present

## 2016-01-20 DIAGNOSIS — I4891 Unspecified atrial fibrillation: Secondary | ICD-10-CM

## 2016-01-20 DIAGNOSIS — E039 Hypothyroidism, unspecified: Secondary | ICD-10-CM | POA: Diagnosis not present

## 2016-01-20 LAB — BASIC METABOLIC PANEL
BUN: 19 mg/dL (ref 7–25)
CALCIUM: 9.1 mg/dL (ref 8.6–10.4)
CHLORIDE: 102 mmol/L (ref 98–110)
CO2: 25 mmol/L (ref 20–31)
CREATININE: 1.42 mg/dL — AB (ref 0.50–0.99)
GLUCOSE: 84 mg/dL (ref 65–99)
Potassium: 4.6 mmol/L (ref 3.5–5.3)
Sodium: 140 mmol/L (ref 135–146)

## 2016-01-20 NOTE — Progress Notes (Signed)
Cardiology Office Note   Date:  01/20/2016   ID:  Allison [name], DOB 1951/02/26, MRN [id number]  PCP:  [name], MD    No chief complaint on file.  F/u AFib  Wt Readings from Last 3 Encounters:  01/20/16 205 lb (93 kg)  03/30/15 187 lb (84.8 kg)  12/16/14 181 lb 12.8 oz (82.5 kg)       History of Present Illness: Allison [name] is a 65 y.o. female   with a hx of deafness, paroxysmal atrial fibrillation with associated hyperthyroidism several years ago, HTN, fibromyalgia, asthma, GERD.   She was noted to be back in atrial fibrillation in May 2016. She was placed on Eliquis and set up for cardioversion. She was maintaining NSR after cardioversion. She walked into the office 12/01/14 with complaints of dizziness and an EKG confirmed recurrent atrial fibrillation. She felt poorly with this including dizziness and near syncope at times and increased DOE. We put her on Flecainide and planned a DCCV. But, she had significant symptoms after her first dose of Flecainide. She had severe abdominal pain. She was referred to the AFib Clinic. She was seen by Rudi Coco, NP and Dr. Johney Frame. She was switched to Propafenone 150 mg TID. She underwent DCCV as previously arranged on 12/04/14. She had return of NSR. She was seen back in the AFib Clinic 8/8 and noted abdominal bloating and constipation from the Propafenone. This drug was also stopped. It was not felt that she would be a good candidate for Multaq. She would require a washout period after stopping Propafenone before Sotalol could be considered (it would have to be started while in NSR as an OP per [employer] note). She noted difficulty planning an admission for Tikosyn Rx.  She did not get assistance with the Eliquis cost. No signs of internal bleeding.  She was maintaining NSR at her last visit.  SHe was intolerant of flecainide and propafenone.  She has felt poorly since the last visit.  She had some family  stressors.  She feels that her heart races when she walks.     Past Medical History:  Diagnosis Date  . AC (acromioclavicular) joint bone spurs    spurs in shoulders with pain  . Arrhythmia   . Arthritis    all over  . Asthma    from reflux  . Complication of anesthesia   . Deaf   . Fibromyalgia   . GERD (gastroesophageal reflux disease)   . Hypertension   . Hypothyroidism   . PONV (postoperative nausea and vomiting)     Past Surgical History:  Procedure Laterality Date  . ABDOMINAL HYSTERECTOMY     partial  . CARDIOVERSION N/A 10/16/2014   Procedure: CARDIOVERSION;  Surgeon: Corky Crafts, MD;  Location: [facility] ENDOSCOPY;  Service: Cardiovascular;  Laterality: N/A;  . CARDIOVERSION N/A 12/04/2014   Procedure: CARDIOVERSION;  Surgeon: Thurmon Fair, MD;  Location: [facility] ENDOSCOPY;  Service: Cardiovascular;  Laterality: N/A;  . COLONOSCOPY  [id number]  . COLONOSCOPY WITH PROPOFOL  04/09/2012   Procedure: COLONOSCOPY WITH PROPOFOL;  Surgeon: Charolett Bumpers, MD;  Location: [facility] ENDOSCOPY;  Service: Endoscopy;  Laterality: N/A;  . ESOPHAGOGASTRODUODENOSCOPY ENDOSCOPY     several times  . EYE SURGERY     catarcts  . Righr shoulder surgery  1999   spurs  . THYROID SURGERY  1991  . TONSILLECTOMY    . TOTAL SHOULDER ARTHROPLASTY Right 07/02/2012   Procedure: TOTAL SHOULDER ARTHROPLASTY;  Surgeon: Jill Alexanders  Jerrilyn CairoWilliam Chandler, MD;  [facility];  Service: Orthopedics;  Laterality: Right;  Right total shoulder arthroplasty  . TUBAL LIGATION    . vericose surgery Right leg  05-1998  . WISDOM TOOTH EXTRACTION       Current Outpatient Prescriptions  Medication Sig Dispense Refill  . ALPRAZolam (XANAX) 0.25 MG tablet Take 0.25 mg by mouth at bedtime.     Marland Kitchen. apixaban (ELIQUIS) 5 MG TABS tablet Take 1 tablet (5 mg total) by mouth 2 (two) times daily. 180 tablet 3  . atenolol (TENORMIN) 50 MG tablet Take 1 tablet by mouth  daily before breakfast 90 tablet 1  . Calcium Carbonate-Vitamin D  (CALTRATE 600+D) 600-400 MG-UNIT per tablet Take 1 tablet by mouth daily.    . Cyanocobalamin (VITAMIN B-12) 5000 MCG SUBL Place 1 tablet under the tongue. For nerve pain in feet and metabolism support    . cyclobenzaprine (FLEXERIL) 5 MG tablet Take 5 mg by mouth 3 (three) times daily as needed. Muscle spasms    . diphenhydrAMINE (BENADRYL) 25 mg capsule Take 25 mg by mouth every 6 (six) hours as needed. For itching    . furosemide (LASIX) 20 MG tablet Take 20 mg by mouth daily.    Marland Kitchen. gabapentin (NEURONTIN) 600 MG tablet Take 600 mg by mouth 3 (three) times daily.  6  . levothyroxine (SYNTHROID, LEVOTHROID) 112 MCG tablet TAKE 1 TABLET ONCE A DAY ORALLY 30 DAYS  11  . LORazepam (ATIVAN) 2 MG tablet Take 2 mg by mouth at bedtime as needed. For restful sleep    . montelukast (SINGULAIR) 10 MG tablet Take 10 mg by mouth daily before breakfast.     . omeprazole (PRILOSEC) 40 MG capsule Take 40 mg by mouth daily.     Marland Kitchen. oxyCODONE-acetaminophen (PERCOCET/ROXICET) 5-325 MG tablet Take 1-2 tablets by mouth every 6 (six) hours as needed for moderate pain.     . potassium chloride (KLOR-CON) 8 MEQ tablet Take 8 mEq by mouth daily.    . promethazine (PHENERGAN) 25 MG tablet Take 25 mg by mouth daily as needed for nausea or vomiting.    Marland Kitchen. tiZANidine (ZANAFLEX) 2 MG tablet Take 2 mg by mouth daily as needed for muscle spasms.     . traMADol (ULTRAM) 50 MG tablet Take 50 mg by mouth every 6 (six) hours as needed for moderate pain.      No current facility-administered medications for this visit.     Allergies:   Aleve [naproxen]; Bextra [valdecoxib]; Biaxin [clarithromycin]; Doxycycline; Durabac [apap-salicyl-phenyltolox-caff]; Estradiol; Levaquin [levofloxacin]; Macrobid [nitrofurantoin]; Motrin [ibuprofen]; Oxaprozin; Robaxin [methocarbamol]; Sulfa antibiotics; Topamax [topiramate]; and Trazodone and nefazodone    Social History:  The patient  reports that she has never smoked. She has never used smokeless  tobacco. She reports that she does not drink alcohol or use drugs.   Family History:  The patient's family history includes Arrhythmia in her father and mother; Heart attack in her maternal uncle; Hypertension in her father, maternal grandmother, and mother; Kidney disease in her paternal grandfather; Lung cancer in her father; Stroke in her maternal grandmother.    ROS:  Please see the history of present illness.   Otherwise, review of systems are positive for fatigue.   All other systems are reviewed and negative.    PHYSICAL EXAM: VS:  BP 110/60   Pulse 67   Ht 5\' 8"  (1.727 m)   Wt 205 lb (93 kg)   BMI 31.17 kg/m  , BMI Body  mass index is 31.17 kg/m. GEN: Well nourished, well developed, in no acute distress  HEENT: normal  Neck: no JVD, carotid bruits, or masses Cardiac: irregularly irregular; no murmurs, rubs, or gallops, tr leg edema  Respiratory:  clear to auscultation bilaterally, normal work of breathing GI: soft, nontender, nondistended, + BS MS: no deformity or atrophy  Skin: warm and dry, no rash Neuro:  Strength and sensation are intact Psych: euthymic mood, full affect   EKG:   The ekg ordered today demonstrates AFib , RVR   Recent Labs: No results found for requested labs within last 8760 hours.   Lipid Panel No results found for: CHOL, TRIG, HDL, CHOLHDL, VLDL, LDLCALC, LDLDIRECT   Other studies Reviewed: Additional studies/ records that were reviewed today with results demonstrating: Normal EF by prior echo.   ASSESSMENT AND PLAN:  1. AFib:  Back in Afib. She did not tolerate flecainide or propafenone due to side effects. She was thought not to be a good candidate for multaq. Since she is back in atrial fibrillation, will need to get electrophysiology input regarding antiarrhythmic. Sotalol would be a possibility.  Will defer to Dr. Johney Frame and the AFib clinic, who she has seen before.  2. History of hyperthyroidism: Now hypothyroid. Synthroid.  This is  being adjusted. 3. Continue Eliquis for stroke prevention.  She is still in the process of trying to get patient assistance.  4. SHOB: Check BNP.    Current medicines are reviewed at length with the patient today.  The patient concerns regarding her medicines were addressed.  The following changes have been made:  No change  Labs/ tests ordered today include:   Orders Placed This Encounter  Procedures  . B Nat Peptide  . Basic Metabolic Panel (BMET)  . EKG 12-Lead    Recommend 150 minutes/week of aerobic exercise Low fat, low carb, high fiber diet recommended  Disposition:   FU after AFib clinic f/u   Signed, Lance Muss, MD  01/20/2016 2:41 PM    [facility] [address] [place], [place], [place]  [address] Phone: [phone]; Fax: [phone]

## 2016-01-20 NOTE — Patient Instructions (Signed)
Medication Instructions:  Same-no changes  Labwork: None  Testing/Procedures: None  Follow-Up: Dr Eldridge Dace is referring you back to the A-Fib clinic.     If you need a refill on your cardiac medications before your next appointment, please call your pharmacy.

## 2016-01-21 ENCOUNTER — Telehealth: Payer: Self-pay | Admitting: *Deleted

## 2016-01-21 LAB — BRAIN NATRIURETIC PEPTIDE: Brain Natriuretic Peptide: 310.6 pg/mL — ABNORMAL HIGH

## 2016-01-21 NOTE — Telephone Encounter (Signed)
Pt notified of lab results and findings by phone. Pt advised due to BNP elevated to increase lasix to 40 mg on Saturday and Sunday, beginning on Monday 01/24/16 resume lasix 20 mg daily. Pt verbalized understandig to the lasix change for 2 days. Pt confirmed her appt with Rudi Coco 01/26/16 @ 11:30. Pt said she sometimes feels her heart beating. I advised that her heart at those times may be out of rhythm in A-fib. Pt states to me maybe she might need a pacemaker. I advised pt that if she is concerned then she should discuss this with Rudi Coco 01/26/16. Pt agreed and thanked me for my time.

## 2016-01-24 ENCOUNTER — Other Ambulatory Visit: Payer: Self-pay | Admitting: Endocrinology

## 2016-01-24 DIAGNOSIS — E041 Nontoxic single thyroid nodule: Secondary | ICD-10-CM

## 2016-01-26 ENCOUNTER — Ambulatory Visit (HOSPITAL_COMMUNITY)
Admission: RE | Admit: 2016-01-26 | Discharge: 2016-01-26 | Disposition: A | Discharge status: Home / Self Care | Payer: Medicare Other | Source: Ambulatory Visit | Attending: Nurse Practitioner | Admitting: Nurse Practitioner

## 2016-01-26 ENCOUNTER — Encounter (HOSPITAL_COMMUNITY): Payer: Self-pay | Admitting: Nurse Practitioner

## 2016-01-26 VITALS — BP 120/78 | HR 99 | Ht 68.0 in | Wt 203.6 lb

## 2016-01-26 DIAGNOSIS — K219 Gastro-esophageal reflux disease without esophagitis: Secondary | ICD-10-CM | POA: Insufficient documentation

## 2016-01-26 DIAGNOSIS — M1990 Unspecified osteoarthritis, unspecified site: Secondary | ICD-10-CM | POA: Diagnosis not present

## 2016-01-26 DIAGNOSIS — I482 Chronic atrial fibrillation, unspecified: Secondary | ICD-10-CM

## 2016-01-26 DIAGNOSIS — I4819 Other persistent atrial fibrillation: Secondary | ICD-10-CM

## 2016-01-26 DIAGNOSIS — I4891 Unspecified atrial fibrillation: Secondary | ICD-10-CM | POA: Diagnosis present

## 2016-01-26 DIAGNOSIS — E039 Hypothyroidism, unspecified: Secondary | ICD-10-CM | POA: Diagnosis not present

## 2016-01-26 DIAGNOSIS — M797 Fibromyalgia: Secondary | ICD-10-CM | POA: Insufficient documentation

## 2016-01-26 DIAGNOSIS — I481 Persistent atrial fibrillation: Secondary | ICD-10-CM

## 2016-01-26 DIAGNOSIS — H9190 Unspecified hearing loss, unspecified ear: Secondary | ICD-10-CM | POA: Insufficient documentation

## 2016-01-26 DIAGNOSIS — I10 Essential (primary) hypertension: Secondary | ICD-10-CM | POA: Insufficient documentation

## 2016-01-26 DIAGNOSIS — Z7901 Long term (current) use of anticoagulants: Secondary | ICD-10-CM | POA: Diagnosis not present

## 2016-01-26 DIAGNOSIS — J45909 Unspecified asthma, uncomplicated: Secondary | ICD-10-CM | POA: Insufficient documentation

## 2016-01-26 NOTE — Progress Notes (Signed)
Patient ID: Allison [name], female   DOB: 10/21/1950, 65 y.o.   MRN: [id number]     Primary Care Physician: [name],[name], MD Referring Physician: [name] Cardiology: [name]   Allison [name] is a 65 y.o. female with a h/o PAF, deafness, hyperthyroidism, HTN, fibromyalgia, asthma, gerd. When she is in afib, she feels lightheaded and weak. She was evaluated by [name] PA/Dr. Katrinka BlazingSmith, 12/01/14  due to being symptomatic from afib,and decision was made to start flecainide 50 mg bid and DCCV,, no  known CAD.(see [name]'s note  as of 8/2). She is deaf and is here with a translator, but can read lips and talk well.  She took one dose of flecainide and she developed severe abdominal pain  within one hour and was in discomfort all night. She is not quite over the discomfort yet but much improved. She was seen in the afib clinic for other options for afib, for she said she would not take anymore flecainide. It was discussed with Dr. [name] and decided to start propafenone 150 mg tid and continue with all the other plans that [name] put into place.  She underwent successful cardioversion 12/04/14, but returned today due to intolerance of Rythmol. She feels bloated and has not had a bowel movement for couple of days. She is crying and says she feels terrible on the drug and she rather have afib. Options were discussed for AAD's. Pt at that point wanted to stop Rythmol and not discuss any further antiarrythmic's.   She did well  maintaining SR for several months. She saw [name] 01/20/16 and was found to be in afib and was feeling poorly with fatigue and exertional dyspnea. She was asked to return to afib clinic again to discuss management. She is here with interpretor. Antiarrythmics discussed today include sotalol and tikosyn both would require hospitalization. Amiodarone but pt already leary of drug due to thyroid issues. She was very distrustful of all meds listed and feared that  would all cause severe abdominal pain. It tried to reassure. I mentioned afib ablation and she was very excited re this possibility. Echo will have to update but prior left [facility] size 2015 41 mm.  Today, she denies symptoms of   chest pain,  orthopnea, PND, lower extremity edema, dizziness, presyncope, syncope, or neurologic sequela. Positive for palps, dyspnea, shortness of breath. The patient is tolerating medications without difficulties and is otherwise without complaint today.   Past Medical History:  Diagnosis Date  . AC (acromioclavicular) joint bone spurs    spurs in shoulders with pain  . Arrhythmia   . Arthritis    all over  . Asthma    from reflux  . Complication of anesthesia   . Deaf   . Fibromyalgia   . GERD (gastroesophageal reflux disease)   . Hypertension   . Hypothyroidism   . PONV (postoperative nausea and vomiting)    Past Surgical History:  Procedure Laterality Date  . ABDOMINAL HYSTERECTOMY     partial  . CARDIOVERSION N/A 10/16/2014   Procedure: CARDIOVERSION;  Surgeon: [name], MD;  [facility];  Service: Cardiovascular;  Laterality: N/A;  . CARDIOVERSION N/A 12/04/2014   Procedure: CARDIOVERSION;  Surgeon: [name], MD;  Location: [facility] ENDOSCOPY;  Service: Cardiovascular;  Laterality: N/A;  . COLONOSCOPY  [id number]  . COLONOSCOPY WITH PROPOFOL  04/09/2012   Procedure: COLONOSCOPY WITH PROPOFOL;  Surgeon: Charolett BumpersMartin K Johnson, MD;  Location: [facility] ENDOSCOPY;  Service: Endoscopy;  Laterality: N/A;  . ESOPHAGOGASTRODUODENOSCOPY ENDOSCOPY     several times  . EYE SURGERY     catarcts  . Righr shoulder surgery  1999   spurs  . THYROID SURGERY  1991  . TONSILLECTOMY    . TOTAL SHOULDER ARTHROPLASTY Right 07/02/2012   Procedure: TOTAL SHOULDER ARTHROPLASTY;  Surgeon: Mable Paris, MD;  Location: [facility] OR;  Service: Orthopedics;  Laterality: Right;  Right total shoulder arthroplasty  . TUBAL LIGATION    . vericose surgery Right leg   05-1998  . WISDOM TOOTH EXTRACTION      Current Outpatient Prescriptions  Medication Sig Dispense Refill  . ALPRAZolam (XANAX) 0.25 MG tablet Take 0.25 mg by mouth at bedtime.     Marland Kitchen apixaban (ELIQUIS) 5 MG TABS tablet Take 1 tablet (5 mg total) by mouth 2 (two) times daily. 180 tablet 3  . atenolol (TENORMIN) 50 MG tablet Take 1 tablet by mouth  daily before breakfast 90 tablet 1  . Calcium Carbonate-Vitamin D (CALTRATE 600+D) 600-400 MG-UNIT per tablet Take 1 tablet by mouth daily.    . Cyanocobalamin (VITAMIN B-12) 5000 MCG SUBL Place 1 tablet under the tongue. For nerve pain in feet and metabolism support    . cyclobenzaprine (FLEXERIL) 5 MG tablet Take 5 mg by mouth 3 (three) times daily as needed. Muscle spasms    . diphenhydrAMINE (BENADRYL) 25 mg capsule Take 25 mg by mouth every 6 (six) hours as needed. For itching    . furosemide (LASIX) 20 MG tablet Take 20 mg by mouth daily.    Marland Kitchen gabapentin (NEURONTIN) 600 MG tablet Take 600 mg by mouth 3 (three) times daily.  6  . levothyroxine (SYNTHROID, LEVOTHROID) 125 MCG tablet Take 125 mcg by mouth daily before breakfast.    . LORazepam (ATIVAN) 2 MG tablet Take 2 mg by mouth at bedtime as needed. For restful sleep    . montelukast (SINGULAIR) 10 MG tablet Take 10 mg by mouth daily before breakfast.     . omeprazole (PRILOSEC) 40 MG capsule Take 40 mg by mouth daily.     . potassium chloride (KLOR-CON) 8 MEQ tablet Take 8 mEq by mouth daily.    . promethazine (PHENERGAN) 25 MG tablet Take 25 mg by mouth daily as needed for nausea or vomiting.    Marland Kitchen tiZANidine (ZANAFLEX) 2 MG tablet Take 2 mg by mouth daily as needed for muscle spasms.     . traMADol (ULTRAM) 50 MG tablet Take 50 mg by mouth every 6 (six) hours as needed for moderate pain.      No current facility-administered medications for this encounter.     Allergies  Allergen Reactions  . Aleve [Naproxen] Other (See Comments)    Stomach ache  . Bextra [Valdecoxib] Other (See  Comments)    Stomach ache   . Biaxin [Clarithromycin] Other (See Comments)    Dizzy, blurred vision, achy stomach  . Doxycycline Other (See Comments)    Stomach ache  . Durabac [Apap-Salicyl-Phenyltolox-Caff] Nausea And Vomiting  . Estradiol Other (See Comments)    Hurt stomach  . Levaquin [Levofloxacin] Other (See Comments)    Dizzy, achy stomach, can't sleep  . Macrobid [Nitrofurantoin] Other (See Comments)    bloating  . Motrin [Ibuprofen] Other (See Comments)    Stomach ahce  . Oxaprozin Other (See Comments)    Hurt stomach  . Robaxin [Methocarbamol] Other (See Comments)    Hurt stomach  . Sulfa Antibiotics Other (See Comments)  Blister on large toe  . Topamax [Topiramate] Nausea And Vomiting  . Trazodone And Nefazodone Other (See Comments)    Weakness in legs, numbness in arms, funny feeling, rapid heart beat, loose focus    Social History   Social History  . Marital status: Married    Spouse name: N/A  . Number of children: N/A  . Years of education: N/A   Occupational History  . Not on file.   Social History Main Topics  . Smoking status: Never Smoker  . Smokeless tobacco: Never Used  . Alcohol use No  . Drug use: No  . Sexual activity: Not on file   Other Topics Concern  . Not on file   Social History Narrative  . No narrative on file    Family History  Problem Relation Age of Onset  . Lung cancer Father   . Arrhythmia Father   . Hypertension Father   . Arrhythmia Mother   . Hypertension Mother   . Stroke Maternal Grandmother   . Hypertension Maternal Grandmother   . Heart attack Maternal Uncle     X2  . Kidney disease Paternal Grandfather     ROS- All systems are reviewed and negative except as per the HPI above  Physical Exam: Vitals:   01/26/16 1136  BP: 120/78  Pulse: 99  Weight: 203 lb 9.6 oz (92.4 kg)  Height: 5\' 8"  (1.727 m)    GEN- The patient is well appearing, alert and oriented x 3 today.   Head- normocephalic,  atraumatic Eyes-  Sclera clear, conjunctiva pink Ears- deaf Oropharynx- clear Neck- supple, no JVP Lymph- no cervical lymphadenopathy Lungs- Clear to ausculation bilaterally, normal work of breathing Heart- Irregular rate and rhythm, no murmurs, rubs or gallops, PMI not laterally displaced GI- soft, NT, ND, + BS Extremities- no clubbing, cyanosis, or edema MS- no significant deformity or atrophy Skin- no rash or lesion Psych- euthymic mood, full affect Neuro- strength and sensation are intact  EKG- afib at 99 bpm,  qrs int 128 ms, qtc 485 ms Epic records reviewed.  Assessment and Plan: 1. Persistent symptomatic afib with intolerance of flecainide/rythmol She is very concerned with any antiarrythmic discussed that it could cause abdominal pain  Discussed possibility of ablation  She is very interested in this Continue apixaban for a chadsvasc score of at least 3 Will  update echo and have her return 10/11 for further discussion with Dr. Nehemiah Settle [facility] [address] [place], [place] [address] [phone]

## 2016-02-01 ENCOUNTER — Ambulatory Visit (HOSPITAL_COMMUNITY)
Admission: RE | Admit: 2016-02-01 | Discharge: 2016-02-01 | Disposition: A | Discharge status: Home / Self Care | Payer: Medicare Other | Source: Ambulatory Visit | Attending: Nurse Practitioner | Admitting: Nurse Practitioner

## 2016-02-01 DIAGNOSIS — I482 Chronic atrial fibrillation, unspecified: Secondary | ICD-10-CM

## 2016-02-01 NOTE — Progress Notes (Signed)
  Echocardiogram 2D Echocardiogram has been performed.  Cathie Beams 02/01/2016, 12:35 PM

## 2016-02-09 ENCOUNTER — Encounter (HOSPITAL_COMMUNITY): Payer: Self-pay | Admitting: Nurse Practitioner

## 2016-02-09 ENCOUNTER — Ambulatory Visit (HOSPITAL_COMMUNITY)
Admission: RE | Admit: 2016-02-09 | Discharge: 2016-02-09 | Disposition: A | Discharge status: Home / Self Care | Payer: Medicare Other | Source: Ambulatory Visit | Attending: Nurse Practitioner | Admitting: Nurse Practitioner

## 2016-02-09 VITALS — BP 122/76 | HR 99 | Ht 68.0 in | Wt 201.6 lb

## 2016-02-09 DIAGNOSIS — I340 Nonrheumatic mitral (valve) insufficiency: Secondary | ICD-10-CM

## 2016-02-09 DIAGNOSIS — M797 Fibromyalgia: Secondary | ICD-10-CM | POA: Diagnosis not present

## 2016-02-09 DIAGNOSIS — I080 Rheumatic disorders of both mitral and aortic valves: Secondary | ICD-10-CM | POA: Insufficient documentation

## 2016-02-09 DIAGNOSIS — R0602 Shortness of breath: Secondary | ICD-10-CM

## 2016-02-09 DIAGNOSIS — Z7901 Long term (current) use of anticoagulants: Secondary | ICD-10-CM | POA: Insufficient documentation

## 2016-02-09 DIAGNOSIS — I110 Hypertensive heart disease with heart failure: Secondary | ICD-10-CM | POA: Insufficient documentation

## 2016-02-09 DIAGNOSIS — K219 Gastro-esophageal reflux disease without esophagitis: Secondary | ICD-10-CM | POA: Insufficient documentation

## 2016-02-09 DIAGNOSIS — I4819 Other persistent atrial fibrillation: Secondary | ICD-10-CM

## 2016-02-09 DIAGNOSIS — E039 Hypothyroidism, unspecified: Secondary | ICD-10-CM | POA: Diagnosis not present

## 2016-02-09 DIAGNOSIS — I509 Heart failure, unspecified: Secondary | ICD-10-CM | POA: Insufficient documentation

## 2016-02-09 DIAGNOSIS — J45909 Unspecified asthma, uncomplicated: Secondary | ICD-10-CM | POA: Diagnosis not present

## 2016-02-09 DIAGNOSIS — I5021 Acute systolic (congestive) heart failure: Secondary | ICD-10-CM | POA: Diagnosis not present

## 2016-02-09 DIAGNOSIS — E079 Disorder of thyroid, unspecified: Secondary | ICD-10-CM | POA: Diagnosis not present

## 2016-02-09 DIAGNOSIS — I481 Persistent atrial fibrillation: Secondary | ICD-10-CM | POA: Insufficient documentation

## 2016-02-09 DIAGNOSIS — M1990 Unspecified osteoarthritis, unspecified site: Secondary | ICD-10-CM | POA: Insufficient documentation

## 2016-02-09 HISTORY — DX: Other persistent atrial fibrillation: I48.19

## 2016-02-09 HISTORY — DX: Nonrheumatic mitral (valve) insufficiency: I34.0

## 2016-02-09 LAB — CBC
HEMATOCRIT: 43.8 % (ref 36.0–46.0)
HEMOGLOBIN: 14.2 g/dL (ref 12.0–15.0)
MCH: 28.6 pg (ref 26.0–34.0)
MCHC: 32.4 g/dL (ref 30.0–36.0)
MCV: 88.3 fL (ref 78.0–100.0)
Platelets: 357 10*3/uL (ref 150–400)
RBC: 4.96 MIL/uL (ref 3.87–5.11)
RDW: 16.8 % — ABNORMAL HIGH (ref 11.5–15.5)
WBC: 9 10*3/uL (ref 4.0–10.5)

## 2016-02-09 LAB — BASIC METABOLIC PANEL
Anion gap: 11 (ref 5–15)
BUN: 11 mg/dL (ref 6–20)
CHLORIDE: 103 mmol/L (ref 101–111)
CO2: 25 mmol/L (ref 22–32)
Calcium: 9.3 mg/dL (ref 8.9–10.3)
Creatinine, Ser: 1.41 mg/dL — ABNORMAL HIGH (ref 0.44–1.00)
GFR calc non Af Amer: 38 mL/min — ABNORMAL LOW (ref >60)
GFR, EST AFRICAN AMERICAN: 44 mL/min — AB (ref >60)
Glucose, Bld: 143 mg/dL — ABNORMAL HIGH (ref 65–99)
POTASSIUM: 4 mmol/L (ref 3.5–5.1)
SODIUM: 139 mmol/L (ref 135–145)

## 2016-02-09 NOTE — Patient Instructions (Addendum)
TEE and Heart Cath scheduled for Friday, October 20th  - Arrive at the [employer] and go to admitting at 6:30AM  -Do not eat or drink anything after midnight the night prior to your procedure.  - Take no medications the morning of the procedure.  - HOLD your eliquis after Tuesday evening dose.  - You will not be able to drive home after your procedure.  We will be in touch with you regarding follow up appointment.

## 2016-02-09 NOTE — Progress Notes (Addendum)
Primary Care Physician: [name], MD Primary Cardiologist: Dr Eldridge Dace Primary Electrophysiologist: Dr Johney Frame  Allison [name] is a 65 y.o. female with a history of persistent atrial fibrillation who presents for follow up in the [facility]. She was recently seen by Rudi Coco NP for refractory afib.  An echo was obtained which revealed a newly depressed EF as well as moderate to severe MR.  She presents today for additional follow-up. She does not feel well with afib.  She reports significant and progressive SOB.  She also has palpitations.  Her exercise tolerance is poor. She has had difficulty with hypothyroidism in the past as well as subsequent hypothyroidism.  She reports that her thyroid function was recently found to be abnormal.  She follows with Dr Romero Belling.  Today, she denies symptoms of chest pain, orthopnea, PND, lower extremity edema, dizziness, presyncope, syncope, snoring, daytime somnolence, bleeding, or neurologic sequela. The patient is tolerating medications without difficulties and is otherwise without complaint today.    Atrial Fibrillation Risk Factors:  she does not have a history of rheumatic fever.  she does not have a history of alcohol use.  she has a BMI of Body mass index is 30.65 kg/m.Marland Kitchen Filed Weights   02/09/16 1556  Weight: 201 lb 9.6 oz (91.4 kg)    LA size: 28mm   Atrial Fibrillation Management history:  Previous antiarrhythmic drugs: flecainide, rhythmol  Previous cardioversions: 6/16, 8/16  Previous ablations: none  CHADS2VASC score: 4  Anticoagulation history: eliquis   Past Medical History:  Diagnosis Date  . AC (acromioclavicular) joint bone spurs    spurs in shoulders with pain  . Arthritis    all over  . Asthma    from reflux  . Congestive heart failure (CHF) (HCC)   . Deaf   . Fibromyalgia   . GERD (gastroesophageal reflux disease)   . Hypertension   . Hypothyroidism   . Mitral  regurgitation   . Persistent atrial fibrillation (HCC)   . PONV (postoperative nausea and vomiting)    Past Surgical History:  Procedure Laterality Date  . ABDOMINAL HYSTERECTOMY     partial  . CARDIOVERSION N/A 10/16/2014   Procedure: CARDIOVERSION;  Surgeon: Corky Crafts, MD;  Location: [facility] ENDOSCOPY;  Service: Cardiovascular;  Laterality: N/A;  . CARDIOVERSION N/A 12/04/2014   Procedure: CARDIOVERSION;  Surgeon: Thurmon Fair, MD;  Location: [facility] ENDOSCOPY;  Service: Cardiovascular;  Laterality: N/A;  . COLONOSCOPY  [id number]  . COLONOSCOPY WITH PROPOFOL  04/09/2012   Procedure: COLONOSCOPY WITH PROPOFOL;  Surgeon: Charolett Bumpers, MD;  Location: [facility] ENDOSCOPY;  Service: Endoscopy;  Laterality: N/A;  . ESOPHAGOGASTRODUODENOSCOPY ENDOSCOPY     several times  . EYE SURGERY     catarcts  . Righr shoulder surgery  1999   spurs  . THYROID SURGERY  1991  . TONSILLECTOMY    . TOTAL SHOULDER ARTHROPLASTY Right 07/02/2012   Procedure: TOTAL SHOULDER ARTHROPLASTY;  Surgeon: Mable Paris, MD;  Location: [facility] OR;  Service: Orthopedics;  Laterality: Right;  Right total shoulder arthroplasty  . TUBAL LIGATION    . vericose surgery Right leg  05-1998  . WISDOM TOOTH EXTRACTION      Current Outpatient Prescriptions  Medication Sig Dispense Refill  . ALPRAZolam (XANAX) 0.25 MG tablet Take 0.25 mg by mouth at bedtime.     Marland Kitchen apixaban (ELIQUIS) 5 MG TABS tablet Take 1 tablet (5 mg total) by mouth 2 (two) times daily. 180 tablet 3  .  atenolol (TENORMIN) 50 MG tablet Take 1 tablet by mouth  daily before breakfast 90 tablet 1  . Calcium Carbonate-Vitamin D (CALTRATE 600+D) 600-400 MG-UNIT per tablet Take 1 tablet by mouth daily.    . Cyanocobalamin (VITAMIN B-12) 5000 MCG SUBL Place 1 tablet under the tongue. For nerve pain in feet and metabolism support    . diphenhydrAMINE (BENADRYL) 25 mg capsule Take 25 mg by mouth every 6 (six) hours as needed. For itching    . furosemide (LASIX) 20 MG  tablet Take 20 mg by mouth daily.    Marland Kitchen. gabapentin (NEURONTIN) 600 MG tablet Take 600 mg by mouth 3 (three) times daily.  6  . levothyroxine (SYNTHROID, LEVOTHROID) 125 MCG tablet Take 125 mcg by mouth daily before breakfast.    . LORazepam (ATIVAN) 2 MG tablet Take 2 mg by mouth at bedtime as needed. For restful sleep    . montelukast (SINGULAIR) 10 MG tablet Take 10 mg by mouth daily before breakfast.     . omeprazole (PRILOSEC) 40 MG capsule Take 40 mg by mouth daily.     . potassium chloride (KLOR-CON) 8 MEQ tablet Take 8 mEq by mouth daily.    . promethazine (PHENERGAN) 25 MG tablet Take 25 mg by mouth daily as needed for nausea or vomiting.    Marland Kitchen. tiZANidine (ZANAFLEX) 2 MG tablet Take 2 mg by mouth daily as needed for muscle spasms.     . traMADol (ULTRAM) 50 MG tablet Take 50 mg by mouth every 6 (six) hours as needed for moderate pain.      No current facility-administered medications for this encounter.     Allergies  Allergen Reactions  . Aleve [Naproxen] Other (See Comments)    Stomach ache  . Bextra [Valdecoxib] Other (See Comments)    Stomach ache   . Biaxin [Clarithromycin] Other (See Comments)    Dizzy, blurred vision, achy stomach  . Doxycycline Other (See Comments)    Stomach ache  . Durabac [Apap-Salicyl-Phenyltolox-Caff] Nausea And Vomiting  . Estradiol Other (See Comments)    Hurt stomach  . Levaquin [Levofloxacin] Other (See Comments)    Dizzy, achy stomach, can't sleep  . Macrobid [Nitrofurantoin] Other (See Comments)    bloating  . Motrin [Ibuprofen] Other (See Comments)    Stomach ahce  . Oxaprozin Other (See Comments)    Hurt stomach  . Robaxin [Methocarbamol] Other (See Comments)    Hurt stomach  . Sulfa Antibiotics Other (See Comments)    Blister on large toe  . Topamax [Topiramate] Nausea And Vomiting  . Trazodone And Nefazodone Other (See Comments)    Weakness in legs, numbness in arms, funny feeling, rapid heart beat, loose focus    Social  History   Social History  . Marital status: Married    Spouse name: N/A  . Number of children: N/A  . Years of education: N/A   Occupational History  . Not on file.   Social History Main Topics  . Smoking status: Never Smoker  . Smokeless tobacco: Never Used  . Alcohol use No  . Drug use: No  . Sexual activity: Not on file   Other Topics Concern  . Not on file   Social History Narrative  . No narrative on file    Family History  Problem Relation Age of Onset  . Lung cancer Father   . Arrhythmia Father   . Hypertension Father   . Arrhythmia Mother   . Hypertension Mother   .  Stroke Maternal Grandmother   . Hypertension Maternal Grandmother   . Heart attack Maternal Uncle     X2  . Kidney disease Paternal Grandfather     ROS- All systems are reviewed and negative except as per the HPI above.  Physical Exam: Vitals:   02/09/16 1556  BP: 122/76  Pulse: 99  Weight: 201 lb 9.6 oz (91.4 kg)  Height: 5\' 8"  (1.727 m)    GEN- The patient is overweight appearing, alert and oriented x 3 today.  Very pleasant Head- normocephalic, atraumatic Eyes-  Sclera clear, conjunctiva pink Ears- deaf Oropharynx- clear Neck- supple  Lungs- Clear to ausculation bilaterally, normal work of breathing Heart- irregular rate and rhythm, 2/6 SEM at the apex GI- soft, NT, ND, + BS Extremities- no clubbing, cyanosis, or edema MS- no significant deformity or atrophy Skin- no rash or lesion Psych- euthymic mood, full affect Neuro- strength and sensation are intact  Wt Readings from Last 3 Encounters:  02/09/16 201 lb 9.6 oz (91.4 kg)  01/26/16 203 lb 9.6 oz (92.4 kg)  01/20/16 205 lb (93 kg)    EKG today demonstrates afib, V rate 99 bpm, frequent abbarancy, LVH, nonspecific ST/T changes Echo 02/01/16 demonstrated EF 20-25%, severe diffuse HK, mild to moderate AI, moderate to severe MR, mild LA enlargement, mild to moderate MR  Epic records are reviewed at length  today  Assessment and Plan:  1. persistent atrial fibrillation The patient has symptomatic persistent atrial fibrillation.  She has significant structural heart disease presently.  Currently, will continue rate control until we better evaluate her CHF and MR.  If she has surgical MR then MAZE with LAA amputation would be advised. Continue eliquis for CHADS2VASC of 4.  2. Acute CHF Unclear etiology Possibly due to AF with RVR vs mitral valve disease. Currently on atenolol. Would switch to coreg and add losartan (which has been shown to be beneficial for atrial remodeling in AF as well). RHC/LHC and TEE to further evaluate CHF and MR Discussed the TEE and cath with the patient. The patient understands that risks included but are not limited to stroke (1 in 1000), death (1 in 1000), kidney failure [usually temporary] (1 in 500), bleeding (1 in 200), allergic reaction [possibly serious] (1 in 200), esophageal injury (1 in 1000). The patient understands and agrees to proceed. I will therefore schedule the procedure at the next available time.  Will hold eliquis 36 hours prior to the procedure.  3. Severe mitral regurgitation and moderate Aortic insufficiency TEE with LHC/RHC as above If she has CAD or surgical valvular disease, I would favor surgery with MAZE with LAA amputation.  4. Thyroid dysfunction She reports recent worsening of thyroid dysfunction but cannot articulate this further.  As she is still on levothyroxin, I would imagine that this is hypothyroid related.  I called Dr Vella Raring office however she is not back in the office until Monday.  I will try to discuss with Dr Romero Belling at that time.  Return to follow-up in AF clinic 1-2 weeks after cath.  Today, I have spent 40 minutes with the patient discussing her AF in the setting of echo findings.  More than 50% of the visit time today was spent on this issue.   Hillis Range, MD 02/09/2016 11:51 PM   Addendum: I spoke with Dr Romero Belling  this am. She states that the patient has hypothyroidism.  With recent weight gain, she had increased the patients replacement dose.  Dr Romero Belling did not feel  that the patient had any acute thyroid issues and did not feel that this would interfere with TEE or cath.  Hillis Range MD, [facility] 02/14/2016 10:20 AM

## 2016-02-09 NOTE — Progress Notes (Signed)
error 

## 2016-02-10 ENCOUNTER — Other Ambulatory Visit (HOSPITAL_COMMUNITY): Payer: Self-pay | Admitting: *Deleted

## 2016-02-10 ENCOUNTER — Telehealth (HOSPITAL_COMMUNITY): Payer: Self-pay | Admitting: *Deleted

## 2016-02-10 DIAGNOSIS — I4819 Other persistent atrial fibrillation: Secondary | ICD-10-CM

## 2016-02-10 MED ORDER — LOSARTAN POTASSIUM 25 MG PO TABS: 25.0000 mg | ORAL_TABLET | Freq: Every day | ORAL | 3 refills | Status: DC

## 2016-02-10 MED ORDER — CARVEDILOL 12.5 MG PO TABS: 12.5000 mg | ORAL_TABLET | Freq: Two times a day (BID) | ORAL | 3 refills | Status: DC

## 2016-02-10 NOTE — Telephone Encounter (Signed)
Medication changes were reviewed with patient via sign language interpretor over the phone. Pt  Verbalized understanding to stop atenolol and start coreg 12.5mg  BID and losartan 25mg  daily. Pt was given follow up appointment in 2 weeks. Pt was concerned over all the information given to her yesterday. Echo results as well as instructions for cath/tee were reviewed again. Pt states she understands what to do. Pt wanted to make sure interpretor had been reserved for her procedures -- they were requested at time of scheduling. Pt felt better after our phone conversation and will call back if further questions/concerns.

## 2016-02-10 NOTE — Progress Notes (Signed)
Spoke with Clydie Braun in cath. Lab.  Plan to assign sign language interpretor to arrive at about 7am prior to patient having TEE in endoscopy and will have the interpretor follow patient to the cath. Lab. After procedure.

## 2016-02-11 ENCOUNTER — Other Ambulatory Visit (HOSPITAL_COMMUNITY): Payer: Self-pay | Admitting: *Deleted

## 2016-02-11 DIAGNOSIS — I4819 Other persistent atrial fibrillation: Secondary | ICD-10-CM

## 2016-02-14 NOTE — Addendum Note (Signed)
Encounter addended by: Hillis Range, MD on: 02/14/2016 10:21 AM<BR>    Actions taken: Sign clinical note

## 2016-02-16 ENCOUNTER — Telehealth (HOSPITAL_COMMUNITY): Payer: Self-pay | Admitting: *Deleted

## 2016-02-16 NOTE — Telephone Encounter (Signed)
Pt called in this morning asking for the admitting office to find out cost of upcoming procedures for tomorrow. Reminded patient that her procedures are scheduled for the 20th which is Friday. Pt states she must be confused on her days of the week because she thought today was Thursday. Began to question patient on instructions of upcoming procedure - mainly when she had stopped her Eliquis. Stated she hadn't stopped her Eliquis she thought it was just Friday morning. Instructed patient she needs to stop Eliquis 36 hours prior to procedure so no more Eliquis until after procedure. Patient verbalized understanding. Also confirmed for patient that she does have an interpreter set up to be with her from admission to discharge. Pt was very thankful for this information. Number for admission office was given to patient. At end of call patient again was reminded no more Eliquis until resumed after procedures on Friday.

## 2016-02-18 ENCOUNTER — Encounter (HOSPITAL_COMMUNITY)
Admission: RE | Disposition: A | Discharge status: Home / Self Care | Payer: Self-pay | Source: Ambulatory Visit | Attending: Cardiology

## 2016-02-18 ENCOUNTER — Ambulatory Visit (HOSPITAL_COMMUNITY)
Admission: RE | Admit: 2016-02-18 | Discharge: 2016-02-18 | Disposition: A | Discharge status: Home / Self Care | Payer: Medicare Other | Source: Ambulatory Visit | Attending: Cardiology | Admitting: Cardiology

## 2016-02-18 ENCOUNTER — Encounter (HOSPITAL_COMMUNITY): Payer: Self-pay | Admitting: *Deleted

## 2016-02-18 ENCOUNTER — Ambulatory Visit (HOSPITAL_BASED_OUTPATIENT_CLINIC_OR_DEPARTMENT_OTHER): Payer: Medicare Other

## 2016-02-18 DIAGNOSIS — M1990 Unspecified osteoarthritis, unspecified site: Secondary | ICD-10-CM | POA: Insufficient documentation

## 2016-02-18 DIAGNOSIS — I509 Heart failure, unspecified: Secondary | ICD-10-CM | POA: Insufficient documentation

## 2016-02-18 DIAGNOSIS — I130 Hypertensive heart and chronic kidney disease with heart failure and stage 1 through stage 4 chronic kidney disease, or unspecified chronic kidney disease: Secondary | ICD-10-CM | POA: Insufficient documentation

## 2016-02-18 DIAGNOSIS — Z8249 Family history of ischemic heart disease and other diseases of the circulatory system: Secondary | ICD-10-CM | POA: Diagnosis not present

## 2016-02-18 DIAGNOSIS — Z7901 Long term (current) use of anticoagulants: Secondary | ICD-10-CM | POA: Insufficient documentation

## 2016-02-18 DIAGNOSIS — I4819 Other persistent atrial fibrillation: Secondary | ICD-10-CM

## 2016-02-18 DIAGNOSIS — K219 Gastro-esophageal reflux disease without esophagitis: Secondary | ICD-10-CM | POA: Insufficient documentation

## 2016-02-18 DIAGNOSIS — Z882 Allergy status to sulfonamides status: Secondary | ICD-10-CM | POA: Insufficient documentation

## 2016-02-18 DIAGNOSIS — I340 Nonrheumatic mitral (valve) insufficiency: Secondary | ICD-10-CM

## 2016-02-18 DIAGNOSIS — M797 Fibromyalgia: Secondary | ICD-10-CM | POA: Insufficient documentation

## 2016-02-18 DIAGNOSIS — I080 Rheumatic disorders of both mitral and aortic valves: Secondary | ICD-10-CM | POA: Diagnosis not present

## 2016-02-18 DIAGNOSIS — H9190 Unspecified hearing loss, unspecified ear: Secondary | ICD-10-CM | POA: Insufficient documentation

## 2016-02-18 DIAGNOSIS — J45909 Unspecified asthma, uncomplicated: Secondary | ICD-10-CM | POA: Insufficient documentation

## 2016-02-18 DIAGNOSIS — E039 Hypothyroidism, unspecified: Secondary | ICD-10-CM | POA: Insufficient documentation

## 2016-02-18 DIAGNOSIS — N189 Chronic kidney disease, unspecified: Secondary | ICD-10-CM | POA: Insufficient documentation

## 2016-02-18 DIAGNOSIS — I481 Persistent atrial fibrillation: Secondary | ICD-10-CM | POA: Diagnosis not present

## 2016-02-18 DIAGNOSIS — Z823 Family history of stroke: Secondary | ICD-10-CM | POA: Diagnosis not present

## 2016-02-18 DIAGNOSIS — Z801 Family history of malignant neoplasm of trachea, bronchus and lung: Secondary | ICD-10-CM | POA: Insufficient documentation

## 2016-02-18 HISTORY — PX: TEE WITHOUT CARDIOVERSION: SHX5443

## 2016-02-18 HISTORY — PX: CARDIAC CATHETERIZATION: SHX172

## 2016-02-18 LAB — POCT I-STAT 3, VENOUS BLOOD GAS (G3P V)
ACID-BASE EXCESS: 2 mmol/L (ref 0.0–2.0)
ACID-BASE EXCESS: 3 mmol/L — AB (ref 0.0–2.0)
BICARBONATE: 27 mmol/L (ref 20.0–28.0)
BICARBONATE: 28.2 mmol/L — AB (ref 20.0–28.0)
O2 SAT: 55 %
O2 Saturation: 55 %
PO2 VEN: 29 mmHg — AB (ref 32.0–45.0)
TCO2: 28 mmol/L (ref 0–100)
TCO2: 30 mmol/L (ref 0–100)
pCO2, Ven: 42.5 mmHg — ABNORMAL LOW (ref 44.0–60.0)
pCO2, Ven: 44.4 mmHg (ref 44.0–60.0)
pH, Ven: 7.411 (ref 7.250–7.430)
pH, Ven: 7.411 (ref 7.250–7.430)
pO2, Ven: 29 mmHg — CL (ref 32.0–45.0)

## 2016-02-18 SURGERY — ECHOCARDIOGRAM, TRANSESOPHAGEAL
Anesthesia: Moderate Sedation

## 2016-02-18 SURGERY — RIGHT/LEFT HEART CATH AND CORONARY ANGIOGRAPHY

## 2016-02-18 MED ORDER — SODIUM CHLORIDE 0.9 % IV SOLN: 250.0000 mL | INTRAVENOUS | Status: DC | PRN

## 2016-02-18 MED ORDER — HEPARIN (PORCINE) IN NACL 2-0.9 UNIT/ML-% IJ SOLN
INTRAMUSCULAR | Status: AC
  Filled 2016-02-18: qty 1000

## 2016-02-18 MED ORDER — FENTANYL CITRATE (PF) 100 MCG/2ML IJ SOLN
INTRAMUSCULAR | Status: DC | PRN
  Administered 2016-02-18 (×3): 25 ug via INTRAVENOUS

## 2016-02-18 MED ORDER — MIDAZOLAM HCL 10 MG/2ML IJ SOLN
INTRAMUSCULAR | Status: DC | PRN
Start: 2016-02-18 — End: 2016-02-18
  Administered 2016-02-18: 2 mg via INTRAVENOUS
  Administered 2016-02-18: 1 mg via INTRAVENOUS
  Administered 2016-02-18: 2 mg via INTRAVENOUS
  Administered 2016-02-18: 1 mg via INTRAVENOUS

## 2016-02-18 MED ORDER — METOPROLOL TARTRATE 5 MG/5ML IV SOLN
INTRAVENOUS | Status: DC | PRN
  Administered 2016-02-18 (×2): 2.5 mg via INTRAVENOUS

## 2016-02-18 MED ORDER — SODIUM CHLORIDE 0.9% FLUSH: 3.0000 mL | INTRAVENOUS | Status: DC | PRN

## 2016-02-18 MED ORDER — MIDAZOLAM HCL 2 MG/2ML IJ SOLN
INTRAMUSCULAR | Status: AC
Start: 2016-02-18 — End: 2016-02-18
  Filled 2016-02-18: qty 2

## 2016-02-18 MED ORDER — SODIUM CHLORIDE 0.9 % IV SOLN
INTRAVENOUS | Status: DC | PRN
  Administered 2016-02-18: 10 mL/h via INTRAVENOUS

## 2016-02-18 MED ORDER — SODIUM CHLORIDE 0.9% FLUSH: 3.0000 mL | Freq: Two times a day (BID) | INTRAVENOUS | Status: DC

## 2016-02-18 MED ORDER — ONDANSETRON HCL 4 MG/2ML IJ SOLN: 4.0000 mg | Freq: Four times a day (QID) | INTRAMUSCULAR | Status: DC | PRN

## 2016-02-18 MED ORDER — LIDOCAINE HCL (PF) 1 % IJ SOLN
INTRAMUSCULAR | Status: DC | PRN
Start: 2016-02-18 — End: 2016-02-18
  Administered 2016-02-18 (×2): 2 mL

## 2016-02-18 MED ORDER — MIDAZOLAM HCL 2 MG/2ML IJ SOLN
INTRAMUSCULAR | Status: DC | PRN
Start: 2016-02-18 — End: 2016-02-18
  Administered 2016-02-18 (×2): 1 mg via INTRAVENOUS

## 2016-02-18 MED ORDER — DIPHENHYDRAMINE HCL 50 MG/ML IJ SOLN
INTRAMUSCULAR | Status: AC
Start: 2016-02-18 — End: 2016-02-18
  Filled 2016-02-18: qty 1

## 2016-02-18 MED ORDER — IOPAMIDOL (ISOVUE-370) INJECTION 76%
INTRAVENOUS | Status: DC | PRN
  Administered 2016-02-18: 25 mL via INTRAVENOUS

## 2016-02-18 MED ORDER — IOPAMIDOL (ISOVUE-370) INJECTION 76%
INTRAVENOUS | Status: AC
Start: 2016-02-18 — End: 2016-02-18
  Filled 2016-02-18: qty 100

## 2016-02-18 MED ORDER — SODIUM CHLORIDE 0.9% FLUSH
3.0000 mL | Freq: Two times a day (BID) | INTRAVENOUS | Status: DC
Start: 2016-02-18 — End: 2016-02-18

## 2016-02-18 MED ORDER — SODIUM CHLORIDE 0.9 % WEIGHT BASED INFUSION: 1.0000 mL/kg/h | INTRAVENOUS | Status: DC

## 2016-02-18 MED ORDER — HEPARIN SODIUM (PORCINE) 1000 UNIT/ML IJ SOLN
INTRAMUSCULAR | Status: DC | PRN
Start: 2016-02-18 — End: 2016-02-18
  Administered 2016-02-18: 4500 [IU] via INTRAVENOUS

## 2016-02-18 MED ORDER — HEPARIN SODIUM (PORCINE) 1000 UNIT/ML IJ SOLN
INTRAMUSCULAR | Status: AC
  Filled 2016-02-18: qty 1

## 2016-02-18 MED ORDER — LIDOCAINE HCL (PF) 1 % IJ SOLN
INTRAMUSCULAR | Status: AC
  Filled 2016-02-18: qty 30

## 2016-02-18 MED ORDER — FENTANYL CITRATE (PF) 100 MCG/2ML IJ SOLN
INTRAMUSCULAR | Status: DC | PRN
Start: 2016-02-18 — End: 2016-02-18
  Administered 2016-02-18 (×2): 25 ug via INTRAVENOUS

## 2016-02-18 MED ORDER — HEPARIN (PORCINE) IN NACL 2-0.9 UNIT/ML-% IJ SOLN
INTRAMUSCULAR | Status: DC | PRN
  Administered 2016-02-18: 1000 mL

## 2016-02-18 MED ORDER — FENTANYL CITRATE (PF) 100 MCG/2ML IJ SOLN
INTRAMUSCULAR | Status: AC
  Filled 2016-02-18: qty 2

## 2016-02-18 MED ORDER — VERAPAMIL HCL 2.5 MG/ML IV SOLN
INTRAVENOUS | Status: AC
  Filled 2016-02-18: qty 2

## 2016-02-18 MED ORDER — SODIUM CHLORIDE 0.9 % WEIGHT BASED INFUSION: 3.0000 mL/kg/h | INTRAVENOUS | Status: AC

## 2016-02-18 MED ORDER — HEPARIN (PORCINE) IN NACL 2-0.9 UNIT/ML-% IJ SOLN
INTRAMUSCULAR | Status: DC | PRN
  Administered 2016-02-18: 10 mL via INTRA_ARTERIAL

## 2016-02-18 MED ORDER — SODIUM CHLORIDE 0.9 % IV SOLN
INTRAVENOUS | Status: DC
  Administered 2016-02-18: 07:00:00 via INTRAVENOUS

## 2016-02-18 MED ORDER — DIPHENHYDRAMINE HCL 50 MG/ML IJ SOLN
INTRAMUSCULAR | Status: DC | PRN
  Administered 2016-02-18: 12.5 mg via INTRAVENOUS
  Administered 2016-02-18: 25 mg via INTRAVENOUS

## 2016-02-18 MED ORDER — BUTAMBEN-TETRACAINE-BENZOCAINE 2-2-14 % EX AERO
INHALATION_SPRAY | CUTANEOUS | Status: DC | PRN
  Administered 2016-02-18: 2 via TOPICAL

## 2016-02-18 MED ORDER — MIDAZOLAM HCL 5 MG/ML IJ SOLN
INTRAMUSCULAR | Status: AC
  Filled 2016-02-18: qty 2

## 2016-02-18 SURGICAL SUPPLY — 15 items
CATH BALLN WEDGE 5F 110CM (CATHETERS) ×2 IMPLANT
CATH INFINITI 5 FR JL3.5 (CATHETERS) ×2 IMPLANT
CATH INFINITI JR4 5F (CATHETERS) ×2 IMPLANT
DEVICE RAD COMP TR BAND LRG (VASCULAR PRODUCTS) ×2 IMPLANT
GLIDESHEATH SLEND SS 6F .021 (SHEATH) ×2 IMPLANT
GUIDEWIRE .025 260CM (WIRE) ×2 IMPLANT
KIT HEART LEFT (KITS) ×3 IMPLANT
PACK CARDIAC CATHETERIZATION (CUSTOM PROCEDURE TRAY) ×3 IMPLANT
SHEATH FAST CATH BRACH 5F 5CM (SHEATH) ×2 IMPLANT
SYR MEDRAD MARK V 150ML (SYRINGE) ×3 IMPLANT
TRANSDUCER W/STOPCOCK (MISCELLANEOUS) ×4 IMPLANT
TUBING CIL FLEX 10 FLL-RA (TUBING) ×3 IMPLANT
WIRE EMERALD 3MM-J .025X260CM (WIRE) ×2 IMPLANT
WIRE EMERALD 3MM-J .035X260CM (WIRE) ×2 IMPLANT
WIRE HI TORQ VERSACORE-J 145CM (WIRE) ×2 IMPLANT

## 2016-02-18 NOTE — CV Procedure (Signed)
Procedure: TEE  Indication: Mitral regurgitation, aortic insufficiency.  Sedation: Versed 6 mg IV, Fentanyl 75 mcg IV, Benadryl 50 mg IV, metoprolol 5 mg IV  Impression: Please see echo section for full report.  The patient was in atrial fibrillation with mild RVR during the procedure.  The left ventricle was normal in size with EF 40%, diffuse hypokinesis.  Normal wall thickness. The right ventricle was normal in size with mildly decreased systolic function.  Moderate to severe left atrial enlargement.  Mild right atrial enlargement.  Mild-moderate tricuspid regurgitation with peak RV-RA gradient 45 mmHg.  Trivial PI.  The aortic valve was trileaflet with mild calcification.  No stenosis.  Incomplete leaflet coaptation with probably moderate aortic insufficiency.  The mitral valve did not appear structurally abnormal.  However, there was inadequate coaptation of the leaflets with severe central mitral regurgitation.  PISA ERO 0.45 cm^2.  There was systolic reversal in the pulmonary vein doppler pattern.  There appeared to be a small PFO by color doppler but negative bubble study.  Normal caliber aorta with minimal plaque.   Impression:   1. EF 40% with diffuse hypokinesis.  2. Severe central mitral regurgitation due to inadequate leaflet coaptation.  This may be due to left atrial enlargement and resulting annular dilatation.  3. Moderate aortic insufficiency.   Marca Ancona 02/18/2016 8:50 AM

## 2016-02-18 NOTE — Interval H&P Note (Signed)
History and Physical Interval Note:  02/18/2016 8:19 AM  Allison [name]  has presented today for surgery, with the diagnosis of AFIB  The various methods of treatment have been discussed with the patient and family. After consideration of risks, benefits and other options for treatment, the patient has consented to  Procedure(s): TRANSESOPHAGEAL ECHOCARDIOGRAM (TEE) (N/A) as a surgical intervention .  The patient's history has been reviewed, patient examined, no change in status, stable for surgery.  I have reviewed the patient's chart and labs.  Questions were answered to the patient's satisfaction.     Sheamus Hasting [employer]

## 2016-02-18 NOTE — H&P (View-Only) (Signed)
Primary Care Physician: [name], MD Primary Cardiologist: Dr Eldridge Dace Primary Electrophysiologist: Dr Johney Frame  Allison [name] is a 65 y.o. female with a history of persistent atrial fibrillation who presents for follow up in the [facility]. She was recently seen by Rudi Coco NP for refractory afib.  An echo was obtained which revealed a newly depressed EF as well as moderate to severe MR.  She presents today for additional follow-up. She does not feel well with afib.  She reports significant and progressive SOB.  She also has palpitations.  Her exercise tolerance is poor. She has had difficulty with hypothyroidism in the past as well as subsequent hypothyroidism.  She reports that her thyroid function was recently found to be abnormal.  She follows with Dr Romero Belling.  Today, she denies symptoms of chest pain, orthopnea, PND, lower extremity edema, dizziness, presyncope, syncope, snoring, daytime somnolence, bleeding, or neurologic sequela. The patient is tolerating medications without difficulties and is otherwise without complaint today.    Atrial Fibrillation Risk Factors:  she does not have a history of rheumatic fever.  she does not have a history of alcohol use.  she has a BMI of Body mass index is 30.65 kg/m.Marland Kitchen Filed Weights   02/09/16 1556  Weight: 201 lb 9.6 oz (91.4 kg)    LA size: 28mm   Atrial Fibrillation Management history:  Previous antiarrhythmic drugs: flecainide, rhythmol  Previous cardioversions: 6/16, 8/16  Previous ablations: none  CHADS2VASC score: 4  Anticoagulation history: eliquis   Past Medical History:  Diagnosis Date  . AC (acromioclavicular) joint bone spurs    spurs in shoulders with pain  . Arthritis    all over  . Asthma    from reflux  . Congestive heart failure (CHF) (HCC)   . Deaf   . Fibromyalgia   . GERD (gastroesophageal reflux disease)   . Hypertension   . Hypothyroidism   . Mitral  regurgitation   . Persistent atrial fibrillation (HCC)   . PONV (postoperative nausea and vomiting)    Past Surgical History:  Procedure Laterality Date  . ABDOMINAL HYSTERECTOMY     partial  . CARDIOVERSION N/A 10/16/2014   Procedure: CARDIOVERSION;  Surgeon: Corky Crafts, MD;  Location: [facility] ENDOSCOPY;  Service: Cardiovascular;  Laterality: N/A;  . CARDIOVERSION N/A 12/04/2014   Procedure: CARDIOVERSION;  Surgeon: Thurmon Fair, MD;  Location: [facility] ENDOSCOPY;  Service: Cardiovascular;  Laterality: N/A;  . COLONOSCOPY  [id number]  . COLONOSCOPY WITH PROPOFOL  04/09/2012   Procedure: COLONOSCOPY WITH PROPOFOL;  Surgeon: Charolett Bumpers, MD;  Location: [facility] ENDOSCOPY;  Service: Endoscopy;  Laterality: N/A;  . ESOPHAGOGASTRODUODENOSCOPY ENDOSCOPY     several times  . EYE SURGERY     catarcts  . Righr shoulder surgery  1999   spurs  . THYROID SURGERY  1991  . TONSILLECTOMY    . TOTAL SHOULDER ARTHROPLASTY Right 07/02/2012   Procedure: TOTAL SHOULDER ARTHROPLASTY;  Surgeon: Mable Paris, MD;  Location: [facility] OR;  Service: Orthopedics;  Laterality: Right;  Right total shoulder arthroplasty  . TUBAL LIGATION    . vericose surgery Right leg  05-1998  . WISDOM TOOTH EXTRACTION      Current Outpatient Prescriptions  Medication Sig Dispense Refill  . ALPRAZolam (XANAX) 0.25 MG tablet Take 0.25 mg by mouth at bedtime.     Marland Kitchen apixaban (ELIQUIS) 5 MG TABS tablet Take 1 tablet (5 mg total) by mouth 2 (two) times daily. 180 tablet 3  .  atenolol (TENORMIN) 50 MG tablet Take 1 tablet by mouth  daily before breakfast 90 tablet 1  . Calcium Carbonate-Vitamin D (CALTRATE 600+D) 600-400 MG-UNIT per tablet Take 1 tablet by mouth daily.    . Cyanocobalamin (VITAMIN B-12) 5000 MCG SUBL Place 1 tablet under the tongue. For nerve pain in feet and metabolism support    . diphenhydrAMINE (BENADRYL) 25 mg capsule Take 25 mg by mouth every 6 (six) hours as needed. For itching    . furosemide (LASIX) 20 MG  tablet Take 20 mg by mouth daily.    Marland Kitchen. gabapentin (NEURONTIN) 600 MG tablet Take 600 mg by mouth 3 (three) times daily.  6  . levothyroxine (SYNTHROID, LEVOTHROID) 125 MCG tablet Take 125 mcg by mouth daily before breakfast.    . LORazepam (ATIVAN) 2 MG tablet Take 2 mg by mouth at bedtime as needed. For restful sleep    . montelukast (SINGULAIR) 10 MG tablet Take 10 mg by mouth daily before breakfast.     . omeprazole (PRILOSEC) 40 MG capsule Take 40 mg by mouth daily.     . potassium chloride (KLOR-CON) 8 MEQ tablet Take 8 mEq by mouth daily.    . promethazine (PHENERGAN) 25 MG tablet Take 25 mg by mouth daily as needed for nausea or vomiting.    Marland Kitchen. tiZANidine (ZANAFLEX) 2 MG tablet Take 2 mg by mouth daily as needed for muscle spasms.     . traMADol (ULTRAM) 50 MG tablet Take 50 mg by mouth every 6 (six) hours as needed for moderate pain.      No current facility-administered medications for this encounter.     Allergies  Allergen Reactions  . Aleve [Naproxen] Other (See Comments)    Stomach ache  . Bextra [Valdecoxib] Other (See Comments)    Stomach ache   . Biaxin [Clarithromycin] Other (See Comments)    Dizzy, blurred vision, achy stomach  . Doxycycline Other (See Comments)    Stomach ache  . Durabac [Apap-Salicyl-Phenyltolox-Caff] Nausea And Vomiting  . Estradiol Other (See Comments)    Hurt stomach  . Levaquin [Levofloxacin] Other (See Comments)    Dizzy, achy stomach, can't sleep  . Macrobid [Nitrofurantoin] Other (See Comments)    bloating  . Motrin [Ibuprofen] Other (See Comments)    Stomach ahce  . Oxaprozin Other (See Comments)    Hurt stomach  . Robaxin [Methocarbamol] Other (See Comments)    Hurt stomach  . Sulfa Antibiotics Other (See Comments)    Blister on large toe  . Topamax [Topiramate] Nausea And Vomiting  . Trazodone And Nefazodone Other (See Comments)    Weakness in legs, numbness in arms, funny feeling, rapid heart beat, loose focus    Social  History   Social History  . Marital status: Married    Spouse name: N/A  . Number of children: N/A  . Years of education: N/A   Occupational History  . Not on file.   Social History Main Topics  . Smoking status: Never Smoker  . Smokeless tobacco: Never Used  . Alcohol use No  . Drug use: No  . Sexual activity: Not on file   Other Topics Concern  . Not on file   Social History Narrative  . No narrative on file    Family History  Problem Relation Age of Onset  . Lung cancer Father   . Arrhythmia Father   . Hypertension Father   . Arrhythmia Mother   . Hypertension Mother   .  Stroke Maternal Grandmother   . Hypertension Maternal Grandmother   . Heart attack Maternal Uncle     X2  . Kidney disease Paternal Grandfather     ROS- All systems are reviewed and negative except as per the HPI above.  Physical Exam: Vitals:   02/09/16 1556  BP: 122/76  Pulse: 99  Weight: 201 lb 9.6 oz (91.4 kg)  Height: 5\' 8"  (1.727 m)    GEN- The patient is overweight appearing, alert and oriented x 3 today.  Very pleasant Head- normocephalic, atraumatic Eyes-  Sclera clear, conjunctiva pink Ears- deaf Oropharynx- clear Neck- supple  Lungs- Clear to ausculation bilaterally, normal work of breathing Heart- irregular rate and rhythm, 2/6 SEM at the apex GI- soft, NT, ND, + BS Extremities- no clubbing, cyanosis, or edema MS- no significant deformity or atrophy Skin- no rash or lesion Psych- euthymic mood, full affect Neuro- strength and sensation are intact  Wt Readings from Last 3 Encounters:  02/09/16 201 lb 9.6 oz (91.4 kg)  01/26/16 203 lb 9.6 oz (92.4 kg)  01/20/16 205 lb (93 kg)    EKG today demonstrates afib, V rate 99 bpm, frequent abbarancy, LVH, nonspecific ST/T changes Echo 02/01/16 demonstrated EF 20-25%, severe diffuse HK, mild to moderate AI, moderate to severe MR, mild LA enlargement, mild to moderate MR  Epic records are reviewed at length  today  Assessment and Plan:  1. persistent atrial fibrillation The patient has symptomatic persistent atrial fibrillation.  She has significant structural heart disease presently.  Currently, will continue rate control until we better evaluate her CHF and MR.  If she has surgical MR then MAZE with LAA amputation would be advised. Continue eliquis for CHADS2VASC of 4.  2. Acute CHF Unclear etiology Possibly due to AF with RVR vs mitral valve disease. Currently on atenolol. Would switch to coreg and add losartan (which has been shown to be beneficial for atrial remodeling in AF as well). RHC/LHC and TEE to further evaluate CHF and MR Discussed the TEE and cath with the patient. The patient understands that risks included but are not limited to stroke (1 in 1000), death (1 in 1000), kidney failure [usually temporary] (1 in 500), bleeding (1 in 200), allergic reaction [possibly serious] (1 in 200), esophageal injury (1 in 1000). The patient understands and agrees to proceed. I will therefore schedule the procedure at the next available time.  Will hold eliquis 36 hours prior to the procedure.  3. Severe mitral regurgitation and moderate Aortic insufficiency TEE with LHC/RHC as above If she has CAD or surgical valvular disease, I would favor surgery with MAZE with LAA amputation.  4. Thyroid dysfunction She reports recent worsening of thyroid dysfunction but cannot articulate this further.  As she is still on levothyroxin, I would imagine that this is hypothyroid related.  I called Dr Vella Raring office however she is not back in the office until Monday.  I will try to discuss with Dr Romero Belling at that time.  Return to follow-up in AF clinic 1-2 weeks after cath.  Today, I have spent 40 minutes with the patient discussing her AF in the setting of echo findings.  More than 50% of the visit time today was spent on this issue.   Hillis Range, MD 02/09/2016 11:51 PM   Addendum: I spoke with Dr Romero Belling  this am. She states that the patient has hypothyroidism.  With recent weight gain, she had increased the patients replacement dose.  Dr Romero Belling did not feel  that the patient had any acute thyroid issues and did not feel that this would interfere with TEE or cath.  Hillis Range MD, [facility] 02/14/2016 10:20 AM

## 2016-02-18 NOTE — Discharge Instructions (Signed)
Radial Site Care °Refer to this sheet in the next few weeks. These instructions provide you with information about caring for yourself after your procedure. Your health care provider may also give you more specific instructions. Your treatment has been planned according to current medical practices, but problems sometimes occur. Call your health care provider if you have any problems or questions after your procedure. °WHAT TO EXPECT AFTER THE PROCEDURE °After your procedure, it is typical to have the following: °· Bruising at the radial site that usually fades within 1-2 weeks. °· Blood collecting in the tissue (hematoma) that may be painful to the touch. It should usually decrease in size and tenderness within 1-2 weeks. °HOME CARE INSTRUCTIONS °· Take medicines only as directed by your health care provider. °· You may shower 24-48 hours after the procedure or as directed by your health care provider. Remove the bandage (dressing) and gently wash the site with plain soap and water. Pat the area dry with a clean towel. Do not rub the site, because this may cause bleeding. °· Do not take baths, swim, or use a hot tub until your health care provider approves. °· Check your insertion site every day for redness, swelling, or drainage. °· Do not apply powder or lotion to the site. °· Do not flex or bend the affected arm for 24 hours or as directed by your health care provider. °· Do not push or pull heavy objects with the affected arm for 24 hours or as directed by your health care provider. °· Do not lift over 10 lb (4.5 kg) for 5 days after your procedure or as directed by your health care provider. °· Ask your health care provider when it is okay to: °¨ Return to work or school. °¨ Resume usual physical activities or sports. °¨ Resume sexual activity. °· Do not drive home if you are discharged the same day as the procedure. Have someone else drive you. °· You may drive 24 hours after the procedure unless otherwise  instructed by your health care provider. °· Do not operate machinery or power tools for 24 hours after the procedure. °· If your procedure was done as an outpatient procedure, which means that you went home the same day as your procedure, a responsible adult should be with you for the first 24 hours after you arrive home. °· Keep all follow-up visits as directed by your health care provider. This is important. °SEEK MEDICAL CARE IF: °· You have a fever. °· You have chills. °· You have increased bleeding from the radial site. Hold pressure on the site. °SEEK IMMEDIATE MEDICAL CARE IF: °· You have unusual pain at the radial site. °· You have redness, warmth, or swelling at the radial site. °· You have drainage (other than a small amount of blood on the dressing) from the radial site. °· The radial site is bleeding, and the bleeding does not stop after 30 minutes of holding steady pressure on the site. °· Your arm or hand becomes pale, cool, tingly, or numb. °  °This information is not intended to replace advice given to you by your health care provider. Make sure you discuss any questions you have with your health care provider. °  °Document Released: 05/20/2010 Document Revised: 05/08/2014 Document Reviewed: 11/03/2013 °Elsevier Interactive Patient Education ©2016 [employer] ° ° ° ° °Angiogram, Care After °Refer to this sheet in the next few weeks. These instructions provide you with information about caring for yourself after your procedure.   Your health care provider may also give you more specific instructions. Your treatment has been planned according to current medical practices, but problems sometimes occur. Call your health care provider if you have any problems or questions after your procedure. °WHAT TO EXPECT AFTER THE PROCEDURE °After your procedure, it is typical to have the following: °· Bruising at the catheter insertion site that usually fades within 1-2 weeks. °· Blood collecting in the tissue  (hematoma) that may be painful to the touch. It should usually decrease in size and tenderness within 1-2 weeks. °HOME CARE INSTRUCTIONS °· Take medicines only as directed by your health care provider. °· You may shower 24-48 hours after the procedure or as directed by your health care provider. Remove the bandage (dressing) and gently wash the site with plain soap and water. Pat the area dry with a clean towel. Do not rub the site, because this may cause bleeding. °· Do not take baths, swim, or use a hot tub until your health care provider approves. °· Check your insertion site every day for redness, swelling, or drainage. °· Do not apply powder or lotion to the site. °· Do not lift over 10 lb (4.5 kg) for 5 days after your procedure or as directed by your health care provider. °· Ask your health care provider when it is okay to: °¨ Return to work or school. °¨ Resume usual physical activities or sports. °¨ Resume sexual activity. °· Do not drive home if you are discharged the same day as the procedure. Have someone else drive you. °· You may drive 24 hours after the procedure unless otherwise instructed by your health care provider. °· Do not operate machinery or power tools for 24 hours after the procedure or as directed by your health care provider. °· If your procedure was done as an outpatient procedure, which means that you went home the same day as your procedure, a responsible adult should be with you for the first 24 hours after you arrive home. °· Keep all follow-up visits as directed by your health care provider. This is important. °SEEK MEDICAL CARE IF: °· You have a fever. °· You have chills. °· You have increased bleeding from the catheter insertion site. Hold pressure on the site. °SEEK IMMEDIATE MEDICAL CARE IF: °· You have unusual pain at the catheter insertion site. °· You have redness, warmth, or swelling at the catheter insertion site. °· You have drainage (other than a small amount of blood on  the dressing) from the catheter insertion site. °· The catheter insertion site is bleeding, and the bleeding does not stop after 30 minutes of holding steady pressure on the site. °· The area near or just beyond the catheter insertion site becomes pale, cool, tingly, or numb. °  °This information is not intended to replace advice given to you by your health care provider. Make sure you discuss any questions you have with your health care provider. °  °Document Released: 11/03/2004 Document Revised: 05/08/2014 Document Reviewed: 09/18/2012 °Elsevier Interactive Patient Education ©2016 [employer] ° ° °

## 2016-02-18 NOTE — Progress Notes (Signed)
  Echocardiogram 2D Echocardiogram has been performed.  Delcie Roch 02/18/2016, 9:13 AM

## 2016-02-18 NOTE — Progress Notes (Signed)
Contacted Harriett M. Regarding patients concern about interpreter not being in room during procedure.  Both Harriett and Dr. Shirlee Latch came and discussed situation with patient.  Patient discharged to home with family in stable condition with no further complaints.

## 2016-02-21 ENCOUNTER — Encounter (HOSPITAL_COMMUNITY): Payer: Self-pay | Admitting: Cardiology

## 2016-02-23 ENCOUNTER — Telehealth (INDEPENDENT_AMBULATORY_CARE_PROVIDER_SITE_OTHER): Payer: Self-pay

## 2016-02-23 NOTE — Telephone Encounter (Signed)
Pt is hearing impaired and interpreter called on behalf. Pts husband who is on [employer] advised that the pt is in a lot of pain with her back going on for several weeks and she is wanting an appt. I advised that the first available that we have is not for several weeks and they can make appt we are happy to see them but we do not have anything available right now. Pts husband states that he does not know what to do for her because her percocet is not working. I advised tht if the medication is not working and she is in that much pain then she needs to go to the ER for eval. pts husband states that they do not want to wait in uncomfortable chairs. I advised that if she needs treatment it would be best to wait for a little bit and have treatment today then wait weeks. He states that he will call back and make an appt later on. Advised them also to call the pcp to see if they could work her in today.

## 2016-02-25 ENCOUNTER — Ambulatory Visit (HOSPITAL_COMMUNITY)
Admission: RE | Admit: 2016-02-25 | Discharge: 2016-02-25 | Disposition: A | Discharge status: Home / Self Care | Payer: Medicare Other | Source: Ambulatory Visit | Attending: Nurse Practitioner | Admitting: Nurse Practitioner

## 2016-02-25 VITALS — BP 110/74 | HR 92 | Ht 68.0 in | Wt 198.2 lb

## 2016-02-25 DIAGNOSIS — I340 Nonrheumatic mitral (valve) insufficiency: Secondary | ICD-10-CM | POA: Diagnosis not present

## 2016-02-25 DIAGNOSIS — E039 Hypothyroidism, unspecified: Secondary | ICD-10-CM | POA: Diagnosis not present

## 2016-02-25 DIAGNOSIS — I110 Hypertensive heart disease with heart failure: Secondary | ICD-10-CM | POA: Diagnosis not present

## 2016-02-25 DIAGNOSIS — J45909 Unspecified asthma, uncomplicated: Secondary | ICD-10-CM | POA: Insufficient documentation

## 2016-02-25 DIAGNOSIS — I4819 Other persistent atrial fibrillation: Secondary | ICD-10-CM

## 2016-02-25 DIAGNOSIS — M797 Fibromyalgia: Secondary | ICD-10-CM | POA: Insufficient documentation

## 2016-02-25 DIAGNOSIS — I509 Heart failure, unspecified: Secondary | ICD-10-CM | POA: Insufficient documentation

## 2016-02-25 DIAGNOSIS — K219 Gastro-esophageal reflux disease without esophagitis: Secondary | ICD-10-CM | POA: Insufficient documentation

## 2016-02-25 DIAGNOSIS — Z7901 Long term (current) use of anticoagulants: Secondary | ICD-10-CM | POA: Diagnosis not present

## 2016-02-25 DIAGNOSIS — M1990 Unspecified osteoarthritis, unspecified site: Secondary | ICD-10-CM | POA: Insufficient documentation

## 2016-02-25 DIAGNOSIS — M778 Other enthesopathies, not elsewhere classified: Secondary | ICD-10-CM | POA: Insufficient documentation

## 2016-02-25 DIAGNOSIS — H9190 Unspecified hearing loss, unspecified ear: Secondary | ICD-10-CM | POA: Insufficient documentation

## 2016-02-25 DIAGNOSIS — I481 Persistent atrial fibrillation: Secondary | ICD-10-CM | POA: Diagnosis present

## 2016-02-25 LAB — ECHO TEE
MRPISAEROA: 0.36 cm2
MV VTI: 139 cm

## 2016-02-25 NOTE — Progress Notes (Signed)
Primary Care Physician: [name], MD Referring Physician: Dr. Vella Kohler is a 65 y.o. female with a h/o persistent atrial fibrillation who presents for follow up in the [facility]. She was recently seen   for refractory afib.She has had difficulty with hypothyroidism in the past as well as subsequent hypothyroidism.  She reports that her thyroid function was recently found to be abnormal  An echo was obtained which revealed a newly depressed EF as well as moderate to severe MR.  She does not feel well with afib.  She reports significant and progressive SOB.  She also has palpitations.  Her exercise tolerance is poor.  She was scheduled for TEE as well as Rt /Left heart cath.TEE showed EF a t40% with diffuse hypokinesis with severe central MR due to inadequate leaflet coaptation, may be due to left atrial enlargement and resulting annular dilatation. Moderate AI with incomplete leaflet coaptation. Cath showed no angiographic coronary disease,severe MR. She is pending surgical consult with Dr. Cornelius Moras.  Questions answered for pt today re results of the test and possible surgical intervention. Interpretor available for appointment. Her afib is rate controlled but she feel very fatigued and short of breath with activity and is hoping surgery will relieve a lot of her symptoms.  Today, she denies symptoms of palpitations, chest pain, shortness of breath, orthopnea, PND, lower extremity edema, dizziness, presyncope, syncope, or neurologic sequela. The patient is tolerating medications without difficulties and is otherwise without complaint today.   Past Medical History:  Diagnosis Date  . AC (acromioclavicular) joint bone spurs    spurs in shoulders with pain  . Arthritis    all over  . Asthma    from reflux  . Congestive heart failure (CHF) (HCC)   . Deaf   . Fibromyalgia   . GERD (gastroesophageal reflux disease)   . Hypertension   .  Hypothyroidism   . Mitral regurgitation   . Persistent atrial fibrillation (HCC)   . PONV (postoperative nausea and vomiting)    Past Surgical History:  Procedure Laterality Date  . ABDOMINAL HYSTERECTOMY     partial  . CARDIAC CATHETERIZATION N/A 02/18/2016   Procedure: Right/Left Heart Cath and Coronary Angiography;  Surgeon: Laurey Morale, MD;  Location: [facility] INVASIVE CV LAB;  Service: Cardiovascular;  Laterality: N/A;  . CARDIOVERSION N/A 10/16/2014   Procedure: CARDIOVERSION;  Surgeon: Corky Crafts, MD;  Location: [facility] ENDOSCOPY;  Service: Cardiovascular;  Laterality: N/A;  . CARDIOVERSION N/A 12/04/2014   Procedure: CARDIOVERSION;  Surgeon: Thurmon Fair, MD;  Location: [facility] ENDOSCOPY;  Service: Cardiovascular;  Laterality: N/A;  . COLONOSCOPY  [id number]  . COLONOSCOPY WITH PROPOFOL  04/09/2012   Procedure: COLONOSCOPY WITH PROPOFOL;  Surgeon: Charolett Bumpers, MD;  Location: [facility] ENDOSCOPY;  Service: Endoscopy;  Laterality: N/A;  . ESOPHAGOGASTRODUODENOSCOPY ENDOSCOPY     several times  . EYE SURGERY     catarcts  . Righr shoulder surgery  1999   spurs  . TEE WITHOUT CARDIOVERSION N/A 02/18/2016   Procedure: TRANSESOPHAGEAL ECHOCARDIOGRAM (TEE);  Surgeon: Laurey Morale, MD;  Location: [facility] ENDOSCOPY;  Service: Cardiovascular;  Laterality: N/A;  . THYROID SURGERY  1991  . TONSILLECTOMY    . TOTAL SHOULDER ARTHROPLASTY Right 07/02/2012   Procedure: TOTAL SHOULDER ARTHROPLASTY;  Surgeon: Mable Paris, MD;  Location: [facility] OR;  Service: Orthopedics;  Laterality: Right;  Right total shoulder arthroplasty  . TUBAL LIGATION    . vericose surgery Right  leg  05-1998  . WISDOM TOOTH EXTRACTION      Current Outpatient Prescriptions  Medication Sig Dispense Refill  . ALPRAZolam (XANAX) 0.25 MG tablet Take 0.25 mg by mouth daily as needed for anxiety.     Marland Kitchen. apixaban (ELIQUIS) 5 MG TABS tablet Take 1 tablet (5 mg total) by mouth 2 (two) times daily. 180 tablet 3  . Calcium  Carbonate-Vitamin D (CALTRATE 600+D) 600-400 MG-UNIT per tablet Take 1 tablet by mouth 2 (two) times daily.     . carvedilol (COREG) 12.5 MG tablet Take 1 tablet (12.5 mg total) by mouth 2 (two) times daily. 60 tablet 3  . Cyanocobalamin (VITAMIN B-12) 5000 MCG SUBL Place 1 tablet under the tongue. For nerve pain in feet and metabolism support    . diphenhydrAMINE (BENADRYL) 25 mg capsule Take 25 mg by mouth daily as needed for itching.     . furosemide (LASIX) 20 MG tablet Take 20 mg by mouth daily.    Marland Kitchen. gabapentin (NEURONTIN) 600 MG tablet Take 600 mg by mouth 3 (three) times daily.  6  . levothyroxine (SYNTHROID, LEVOTHROID) 125 MCG tablet Take 125 mcg by mouth daily before breakfast.    . LORazepam (ATIVAN) 2 MG tablet Take 2 mg by mouth at bedtime as needed for sleep. For restful sleep     . losartan (COZAAR) 25 MG tablet Take 1 tablet (25 mg total) by mouth daily. 30 tablet 3  . montelukast (SINGULAIR) 10 MG tablet Take 10 mg by mouth daily before breakfast.     . omeprazole (PRILOSEC) 40 MG capsule Take 40 mg by mouth daily.     Marland Kitchen. oxyCODONE-acetaminophen (PERCOCET/ROXICET) 5-325 MG tablet Take 1 tablet by mouth 2 (two) times daily as needed for moderate pain.     . potassium chloride (KLOR-CON) 8 MEQ tablet Take 8 mEq by mouth daily.    . promethazine (PHENERGAN) 25 MG tablet Take 25 mg by mouth daily as needed for nausea or vomiting.    Marland Kitchen. tiZANidine (ZANAFLEX) 2 MG tablet Take 2 mg by mouth 2 (two) times daily as needed for muscle spasms.     . traMADol (ULTRAM) 50 MG tablet Take 50 mg by mouth every 6 (six) hours as needed for moderate pain.      No current facility-administered medications for this encounter.     Allergies  Allergen Reactions  . Aleve [Naproxen] Other (See Comments)    Stomach ache  . Bextra [Valdecoxib] Other (See Comments)    Stomach ache   . Biaxin [Clarithromycin] Other (See Comments)    Dizzy, blurred vision, achy stomach  . Doxycycline Other (See  Comments)    Stomach ache  . Durabac [Apap-Salicyl-Phenyltolox-Caff] Nausea And Vomiting  . Estradiol Other (See Comments)    Hurt stomach  . Levaquin [Levofloxacin] Other (See Comments)    Dizzy, achy stomach, can't sleep  . Macrobid [Nitrofurantoin] Other (See Comments)    bloating  . Motrin [Ibuprofen] Other (See Comments)    Stomach ahce  . Oxaprozin Other (See Comments)    Hurt stomach  . Robaxin [Methocarbamol] Other (See Comments)    Hurt stomach  . Sulfa Antibiotics Other (See Comments)    Blister on large toe  . Topamax [Topiramate] Nausea And Vomiting  . Trazodone And Nefazodone Other (See Comments)    Weakness in legs, numbness in arms, funny feeling, rapid heart beat, loose focus    Social History   Social History  . Marital status: Married  Spouse name: N/A  . Number of children: N/A  . Years of education: N/A   Occupational History  . Not on file.   Social History Main Topics  . Smoking status: Never Smoker  . Smokeless tobacco: Never Used  . Alcohol use No  . Drug use: No  . Sexual activity: Not on file   Other Topics Concern  . Not on file   Social History Narrative  . No narrative on file    Family History  Problem Relation Age of Onset  . Lung cancer Father   . Arrhythmia Father   . Hypertension Father   . Arrhythmia Mother   . Hypertension Mother   . Stroke Maternal Grandmother   . Hypertension Maternal Grandmother   . Heart attack Maternal Uncle     X2  . Kidney disease Paternal Grandfather     ROS- All systems are reviewed and negative except as per the HPI above  Physical Exam: Vitals:   02/25/16 1013  BP: 110/74  Pulse: 92  Weight: 198 lb 3.2 oz (89.9 kg)  Height: 5\' 8"  (1.727 m)    GEN- The patient is well appearing, alert and oriented x 3 today.   Head- normocephalic, atraumatic Eyes-  Sclera clear, conjunctiva pink Ears- hearing intact Oropharynx- clear Neck- supple, no JVP Lymph- no cervical  lymphadenopathy Lungs- Clear to ausculation bilaterally, normal work of breathing Heart- irregular rate and rhythm, no murmurs, rubs or gallops, PMI not laterally displaced GI- soft, NT, ND, + BS Extremities- no clubbing, cyanosis, or edema MS- no significant deformity or atrophy Skin- no rash or lesion Psych- euthymic mood, full affect Neuro- strength and sensation are intact  EKG- afib at 92 bpm, qrs int 92 ms, qtc 457 ms Epic records reviewed See HPI for results of TEE/ heart cath  Assessment and Plan: 1. Persistent symptomatic afib Intolerant of 1c agents in past due to abdominal pain Recent TEE shows severe MR and is pending appointment with Dr. Cornelius Moras for surgical consult  for valve repair as well as Maze procedure Continue carvedilol Continue apixaban  F/u afib clinic as needed F/u with Dr. Cornelius Moras as scheduled 11/1

## 2016-03-01 ENCOUNTER — Institutional Professional Consult (permissible substitution) (INDEPENDENT_AMBULATORY_CARE_PROVIDER_SITE_OTHER): Payer: Medicare Other | Admitting: Thoracic Surgery (Cardiothoracic Vascular Surgery)

## 2016-03-01 ENCOUNTER — Other Ambulatory Visit: Payer: Self-pay | Admitting: *Deleted

## 2016-03-01 ENCOUNTER — Encounter: Payer: Self-pay | Admitting: Thoracic Surgery (Cardiothoracic Vascular Surgery)

## 2016-03-01 VITALS — BP 125/72 | HR 80 | Resp 16 | Ht 67.5 in | Wt 203.0 lb

## 2016-03-01 DIAGNOSIS — I481 Persistent atrial fibrillation: Secondary | ICD-10-CM

## 2016-03-01 DIAGNOSIS — I5042 Chronic combined systolic (congestive) and diastolic (congestive) heart failure: Secondary | ICD-10-CM

## 2016-03-01 DIAGNOSIS — I340 Nonrheumatic mitral (valve) insufficiency: Secondary | ICD-10-CM | POA: Insufficient documentation

## 2016-03-01 DIAGNOSIS — H9193 Unspecified hearing loss, bilateral: Secondary | ICD-10-CM

## 2016-03-01 DIAGNOSIS — I351 Nonrheumatic aortic (valve) insufficiency: Secondary | ICD-10-CM | POA: Diagnosis not present

## 2016-03-01 DIAGNOSIS — H9190 Unspecified hearing loss, unspecified ear: Secondary | ICD-10-CM | POA: Insufficient documentation

## 2016-03-01 DIAGNOSIS — I4819 Other persistent atrial fibrillation: Secondary | ICD-10-CM

## 2016-03-01 DIAGNOSIS — I428 Other cardiomyopathies: Secondary | ICD-10-CM

## 2016-03-01 NOTE — Patient Instructions (Signed)
Continue all previous medications without any changes at this time  

## 2016-03-01 NOTE — Progress Notes (Signed)
[address]       [name] [address]             [phone]     CARDIOTHORACIC SURGERY CONSULTATION REPORT  Referring Provider is Laurey Morale, MD  Primary Cardiologist is Corky Crafts, MD PCP is [name], MD  Chief Complaint  Patient presents with  . Mitral Regurgitation    EVAL FOR MVR/MAZE...02/01/16, CATH 02/18/16.SHE IS DEAF  . Atrial Fibrillation    persistent    HPI:  Patient is a 65 year old moderately obese female with history of hypertension, atrial fibrillation, nonischemic cardiomyopathy, chronic combined systolic and diastolic congestive heart failure, mitral regurgitation, atrial fibrillation, and bilateral deafness who has been referred for surgical consultation to discuss treatment options for management of severe mitral regurgitation and persistent atrial fibrillation. The patient's cardiac history dates back within 5 years ago when she first presented with paroxysmal atrial fibrillation that occurred in the setting of hyperthyroidism. She was found to have a thyroid nodule and underwent partial thyroidectomy. She has been followed intermittently ever since by Dr. Eldridge Dace. In June 2015 the patient presented with shortness of breath. CT scan performed to rule out pulmonary embolus demonstrated pulmonary edema.  Echocardiogram performed at that time revealed normal left ventricular systolic function with ejection fraction estimated 55-60%. There was mild aortic insufficiency and trivial mitral regurgitation. There was mild left atrial enlargement.  She was started on diuretics with some symptomatic improvement. She returned for follow-up in May 2016 and was found to be in atrial fibrillation.  She was started on Eliquis and underwent successful cardioversion one month later but quickly went back into atrial fibrillation.  She was referred to the [facility] and started on Flecainide but developed abdominal pain.  She  was switched to Propafenone and underwent successful cardioversion, but Propafenone was later stopped due to side effects.  She was not felt to be a good candidate for Multaq.  She return to see Dr. Eldridge Dace for follow-up in September 2017 and complained of worsening exertional shortness of breath, orthopnea, palpitations, and heart racing.  She was noted to be back in atrial fibrillation with rapid ventricular response.  She underwent follow-up transthoracic echocardiogram on 02/01/2016 that revealed significant drop in left ventricular systolic function with ejection fraction estimated at only 20-25%. There was severe diffuse hypokinesis with systolic dysfunction could any. There was moderate to severe mitral regurgitation and mild to moderate aortic insufficiency. There was mild to moderate tricuspid regurgitation. The patient subsequently underwent TEE and diagnostic cardiac catheterization by Dr. Shirlee Latch on 02/18/2016.  TEE confirmed the presence of significant left ventricular systolic dysfunction with ejection fraction estimated at only 40% in the setting of severe mitral regurgitation. There appeared to be normal leaflet mobility the mitral valve but a broad central jet of severe mitral regurgitation. There was flow reversal in the pulmonary veins. There was moderate to severe left atrial enlargement. There was moderate aortic insufficiency. The aortic valve was trileaflet. There appeared to be a small patent foramen ovale but bubble study was negative. There was mild to moderate tricuspid regurgitation.  Cardiac catheterization was notable for the absence of significant coronary artery disease. The patient had elevated pulmonary capillary wedge pressure with prominent V waves but pulmonary artery pressures were only slightly elevated. The patient was referred for surgical consultation.  The patient is married and lives with her husband in [place], [place].  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 Her husband is apparently not well physically. She has 4 adult children and 10 grandchildren. 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 She enjoys gardening and taking care of chores around the house but has been severely limited for the last year or 2 because of exertional shortness of breath. Symptoms have progressed fairly rapidly over the last 3 months. The patient now gets short of breath with minimal activity and occasionally at rest. She cannot lay flat in bed. She occasionally wakes up at night feeling short of breath. She has palpitations with occasional tightness across her upper chest and neck. She has not had dizzy spells or syncope. She has had some mild lower extremity edema. Prior to the development of severe shortness of breath the patient's mobility was only mildly limited because of chronic pain in both feet related to fibromyalgia. Pain in her feet has been alleviated using Neurontin.   Past Medical History:  Diagnosis Date  . AC (acromioclavicular) joint bone spurs    spurs in shoulders with pain  . Arthritis    all over  . Asthma    from reflux  . Congestive heart failure (CHF) (HCC)   . Deaf   . Deafness   . Fibromyalgia   . GERD (gastroesophageal reflux disease)   . Hypertension   . Hypothyroidism   . Mitral regurgitation   . Persistent atrial fibrillation (HCC)   . PONV (postoperative nausea and vomiting)     Past Surgical History:  Procedure Laterality Date  . ABDOMINAL HYSTERECTOMY     partial  . CARDIAC CATHETERIZATION N/A 02/18/2016   Procedure: Right/Left Heart Cath and Coronary Angiography;  Surgeon: Laurey Morale, MD;  Location: [facility] INVASIVE CV LAB;  Service: Cardiovascular;  Laterality: N/A;  . CARDIOVERSION N/A 10/16/2014   Procedure: CARDIOVERSION;  Surgeon: Corky Crafts, MD;  Location: [facility] ENDOSCOPY;  Service:  Cardiovascular;  Laterality: N/A;  . CARDIOVERSION N/A 12/04/2014   Procedure: CARDIOVERSION;  Surgeon: Thurmon Fair, MD;  Location: [facility] ENDOSCOPY;  Service: Cardiovascular;  Laterality: N/A;  . COLONOSCOPY  [id number]  . COLONOSCOPY WITH PROPOFOL  04/09/2012   Procedure: COLONOSCOPY WITH PROPOFOL;  Surgeon: Charolett Bumpers, MD;  Location: [facility] ENDOSCOPY;  Service: Endoscopy;  Laterality: N/A;  . ESOPHAGOGASTRODUODENOSCOPY ENDOSCOPY     several times  . EYE SURGERY     catarcts  . Righr shoulder surgery  1999   spurs  . TEE WITHOUT CARDIOVERSION N/A 02/18/2016   Procedure: TRANSESOPHAGEAL ECHOCARDIOGRAM (TEE);  Surgeon: Laurey Morale, MD;  Location: [facility] ENDOSCOPY;  Service: Cardiovascular;  Laterality: N/A;  . THYROID SURGERY  1991  . TONSILLECTOMY    . TOTAL SHOULDER ARTHROPLASTY Right 07/02/2012   Procedure: TOTAL SHOULDER ARTHROPLASTY;  Surgeon: Mable Paris, MD;  Location: [facility] OR;  Service: Orthopedics;  Laterality: Right;  Right total shoulder arthroplasty  . TUBAL LIGATION    . vericose surgery Right leg  05-1998  . WISDOM TOOTH EXTRACTION      Family History  Problem Relation Age of Onset  . Lung cancer Father   . Arrhythmia Father   . Hypertension Father   . Arrhythmia Mother   . Hypertension Mother   . Stroke Maternal Grandmother   . Hypertension Maternal Grandmother   . Heart attack Maternal Uncle     X2  . Kidney disease Paternal Grandfather     Social History  Social History  . Marital status: Married    Spouse name: N/A  . Number of children: N/A  . Years of education: N/A   Occupational History  . Not on file.   Social History Main Topics  . Smoking status: Never Smoker  . Smokeless tobacco: Never Used  . Alcohol use No  . Drug use: No  . Sexual activity: Not on file   Other Topics Concern  . Not on file   Social History Narrative  . No narrative on file    Current Outpatient Prescriptions  Medication Sig Dispense Refill  .  ALPRAZolam (XANAX) 0.25 MG tablet Take 0.25 mg by mouth daily as needed for anxiety.     Marland Kitchen apixaban (ELIQUIS) 5 MG TABS tablet Take 1 tablet (5 mg total) by mouth 2 (two) times daily. 180 tablet 3  . Calcium Carbonate-Vitamin D (CALTRATE 600+D) 600-400 MG-UNIT per tablet Take 1 tablet by mouth 2 (two) times daily.     . carvedilol (COREG) 12.5 MG tablet Take 1 tablet (12.5 mg total) by mouth 2 (two) times daily. 60 tablet 3  . Cyanocobalamin (VITAMIN B-12) 5000 MCG SUBL Place 1 tablet under the tongue. For nerve pain in feet and metabolism support    . diphenhydrAMINE (BENADRYL) 25 mg capsule Take 25 mg by mouth daily as needed for itching.     . furosemide (LASIX) 20 MG tablet Take 20 mg by mouth daily.    Marland Kitchen gabapentin (NEURONTIN) 600 MG tablet Take 600 mg by mouth 3 (three) times daily.  6  . levothyroxine (SYNTHROID, LEVOTHROID) 125 MCG tablet Take 125 mcg by mouth daily before breakfast.    . LORazepam (ATIVAN) 2 MG tablet Take 2 mg by mouth at bedtime as needed for sleep. For restful sleep     . losartan (COZAAR) 25 MG tablet Take 1 tablet (25 mg total) by mouth daily. 30 tablet 3  . montelukast (SINGULAIR) 10 MG tablet Take 10 mg by mouth daily before breakfast.     . omeprazole (PRILOSEC) 40 MG capsule Take 40 mg by mouth daily.     Marland Kitchen oxyCODONE-acetaminophen (PERCOCET/ROXICET) 5-325 MG tablet Take 1 tablet by mouth 2 (two) times daily as needed for moderate pain.     . potassium chloride (KLOR-CON) 8 MEQ tablet Take 8 mEq by mouth daily.    . promethazine (PHENERGAN) 25 MG tablet Take 25 mg by mouth daily as needed for nausea or vomiting.    Marland Kitchen tiZANidine (ZANAFLEX) 2 MG tablet Take 2 mg by mouth 2 (two) times daily as needed for muscle spasms.     . traMADol (ULTRAM) 50 MG tablet Take 50 mg by mouth every 6 (six) hours as needed for moderate pain.      No current facility-administered medications for this visit.     Allergies  Allergen Reactions  . Aleve [Naproxen] Other (See Comments)     Stomach ache  . Bextra [Valdecoxib] Other (See Comments)    Stomach ache   . Biaxin [Clarithromycin] Other (See Comments)    Dizzy, blurred vision, achy stomach  . Doxycycline Other (See Comments)    Stomach ache  . Durabac [Apap-Salicyl-Phenyltolox-Caff] Nausea And Vomiting  . Estradiol Other (See Comments)    Hurt stomach  . Levaquin [Levofloxacin] Other (See Comments)    Dizzy, achy stomach, can't sleep  . Macrobid [Nitrofurantoin] Other (See Comments)    bloating  . Motrin [Ibuprofen] Other (See Comments)    Stomach ahce  . Oxaprozin Other (  See Comments)    Hurt stomach  . Robaxin [Methocarbamol] Other (See Comments)    Hurt stomach  . Sulfa Antibiotics Other (See Comments)    Blister on large toe  . Topamax [Topiramate] Nausea And Vomiting  . Trazodone And Nefazodone Other (See Comments)    Weakness in legs, numbness in arms, funny feeling, rapid heart beat, loose focus      Review of Systems:   General:  normal appetite, decreased energy, + weight gain, no weight loss, no fever  Cardiac:  no chest pain with exertion, + occasional brief episodes of chest pain at rest, +SOB with exertion, + occasional resting SOB, + PND, + orthopnea, + palpitations, + arrhythmia, + atrial fibrillation, + LE edema, no dizzy spells, no syncope  Respiratory:  + shortness of breath, no home oxygen, no productive cough, no dry cough, no bronchitis, no wheezing, no hemoptysis, no asthma, no pain with inspiration or cough, no sleep apnea, no CPAP at night  GI:   no difficulty swallowing, no reflux, no frequent heartburn, no hiatal hernia, no abdominal pain, no constipation, no diarrhea, no hematochezia, no hematemesis, no melena  GU:   no dysuria,  no frequency, must stand up to void urine due to 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, no urinary tract infection, no hematuria, no kidney stones, no kidney disease  Vascular:  no pain suggestive of claudication, + pain in feet,  no leg cramps, no varicose veins, no DVT, no non-healing foot ulcer  Neuro:   no stroke, no TIA's, no seizures, no headaches, no temporary blindness one eye,  no slurred speech, + peripheral neuropathy, + chronic pain, no instability of gait, mild memory/cognitive dysfunction  Musculoskeletal: + mild arthritis, no joint swelling, + myalgias, mild difficulty walking, slightly limited mobility   Skin:   no rash, no itching, no skin infections, no pressure sores or ulcerations  Psych:   + anxiety, no depression, + nervousness, no unusual recent stress  Eyes:   no blurry vision, no floaters, no recent vision changes, does not wears glasses or contacts  ENT:   + severe bilateral hearing loss, no loose or painful teeth, no dentures, last saw dentist within the past year  Hematologic:  + easy bruising, no abnormal bleeding, no clotting disorder, no frequent epistaxis  Endocrine:  no diabetes, does not check CBG's at home     Physical Exam:   BP 125/72   Pulse 80   Resp 16   Ht 5' 7.5" (1.715 m)   Wt 203 lb (92.1 kg)   SpO2 96% Comment: ON RA  BMI 31.33 kg/m   General:  Moderately obese,  well-appearing  HEENT:  Unremarkable   Neck:   no JVD, no bruits, no adenopathy   Chest:   clear to auscultation, symmetrical breath sounds, no wheezes, no rhonchi   CV:   Irregular rate and rhythm, + systolic murmur   Abdomen:  soft, non-tender, no masses   Extremities:  warm, well-perfused, pulses diminished, no LE edema  Rectal/GU  Deferred  Neuro:   Grossly non-focal and symmetrical throughout  Skin:   Clean and dry, no rashes, no breakdown   Diagnostic Tests:  Transthoracic Echocardiography  Patient:    Allison [name], Allison [name] MR #:       [id number] Study Date: 02/01/2016 Gender:     F Age:        79 Height:     172.7 cm Weight:     92.4 kg BSA:  2.13 m^2 Pt. Status: Room:   SONOGRAPHER  Cathie Beams  Deboraha Sprang  PERFORMING   [facility],  Inpatient  cc:  ------------------------------------------------------------------- LV EF: 20% -   25%  ------------------------------------------------------------------- Indications:      Atrial fibrillation - 427.31.  ------------------------------------------------------------------- History:   PMH:  Edema.  Dyspnea.  ------------------------------------------------------------------- Study Conclusions  - Left ventricle: The cavity size was normal. Wall thickness was   normal. Systolic function was severely reduced. The estimated   ejection fraction was in the range of 20% to 25%. Severe diffuse   hypokinesis. There is profound systolic dyssynchrony, but no   clear cut coronary-distribution regional wall motion   abnormalities. - Ventricular septum: Septal motion showed paradox. These changes   are consistent with a left bundle branch block. - Aortic valve: There was mild to moderate regurgitation directed   centrally in the LVOT. - Mitral valve: There was moderate to severe regurgitation directed   centrally. - Left atrium: The atrium was mildly dilated. - Tricuspid valve: There was mild-moderate regurgitation.  ------------------------------------------------------------------- Study data:  Comparison was made to the study of 10/22/2013.  Study status:  Routine.  Procedure:  Transthoracic echocardiography. Image quality was adequate.  Study completion:  There were no complications.          Transthoracic echocardiography.  M-mode, complete 2D, spectral Doppler, and color Doppler.  Birthdate: Patient birthdate: 11/02/1950.  Age:  Patient is 65 yr old.  Sex: Gender: female.    BMI: 31 kg/m^2.  Blood pressure:     125/80 Patient status:  Outpatient.  Study date:  Study date: 02/01/2016. Study time: 11:20 AM.  Location:  Echo  laboratory.  -------------------------------------------------------------------  ------------------------------------------------------------------- Left ventricle:  The cavity size was normal. Wall thickness was normal. Systolic function was severely reduced. The estimated ejection fraction was in the range of 20% to 25%.  Severe diffuse hypokinesis. The study was not technically sufficient to allow evaluation of LV diastolic dysfunction due to atrial fibrillation.   ------------------------------------------------------------------- Aortic valve:   Trileaflet; normal thickness leaflets.  Doppler: There was no stenosis.   There was mild to moderate regurgitation directed centrally in the LVOT.  ------------------------------------------------------------------- Aorta:  Aortic root: The aortic root was normal in size. Ascending aorta: The ascending aorta was normal in size.  ------------------------------------------------------------------- Mitral valve:   Structurally normal valve.   Leaflet separation was normal.  Doppler:  Transvalvular velocity was within the normal range. There was no evidence for stenosis. There was moderate to severe regurgitation directed centrally.  ------------------------------------------------------------------- Left atrium:  The atrium was mildly dilated.  ------------------------------------------------------------------- Right ventricle:  The cavity size was normal. Systolic function was normal.  ------------------------------------------------------------------- Ventricular septum:   Septal motion showed paradox. These changes are consistent with a left bundle branch block.  ------------------------------------------------------------------- Pulmonic valve:   Poorly visualized.  The valve appears to be grossly normal.    Doppler:  There was no  significant regurgitation.  ------------------------------------------------------------------- Tricuspid valve:   Structurally normal valve.   Leaflet separation was normal.  Doppler:  Transvalvular velocity was within the normal range. There was mild-moderate regurgitation.  ------------------------------------------------------------------- Pulmonary artery:   Systolic pressure was within the normal range.   ------------------------------------------------------------------- Right atrium:  The atrium was normal in size.  ------------------------------------------------------------------- Pericardium:  There was no pericardial effusion.  ------------------------------------------------------------------- Measurements   Left ventricle  Value        Reference  LV ID, ED, PLAX chordal                  44.4  mm     43 - 52  LV ID, ES, PLAX chordal          (H)     38.1  mm     23 - 38  LV fx shortening, PLAX chordal   (L)     14    %      >=29  LV PW thickness, ED                      13.1  mm     ---------  IVS/LV PW ratio, ED                      0.72         <=1.3    Ventricular septum                       Value        Reference  IVS thickness, ED                        9.41  mm     ---------    LVOT                                     Value        Reference  LVOT ID, S                               21    mm     ---------  LVOT area                                3.46  cm^2   ---------    Aortic valve                             Value        Reference  Aortic regurg peak velocity              371   cm/s   ---------  Aortic regurg pressure half-time         608   ms     ---------  Aortic regurg peak gradient              55    mm Hg  ---------    Aorta                                    Value        Reference  Aortic root ID, ED                       34    mm     ---------    Left atrium  Value        Reference  LA  ID, A-P, ES                           41    mm     ---------  LA ID/bsa, A-P                           1.92  cm/m^2 <=2.2  LA volume, S                             44.4  ml     ---------  LA volume/bsa, S                         20.8  ml/m^2 ---------  LA volume, ES, 1-p A4C                   60.5  ml     ---------  LA volume/bsa, ES, 1-p A4C               28.4  ml/m^2 ---------  LA volume, ES, 1-p A2C                   29    ml     ---------  LA volume/bsa, ES, 1-p A2C               13.6  ml/m^2 ---------    Mitral valve                             Value        Reference  Mitral regurg VTI, PISA                  136   cm     ---------    Pulmonary arteries                       Value        Reference  PA pressure, S, DP                       23    mm Hg  <=30    Tricuspid valve                          Value        Reference  Tricuspid regurg peak velocity           221   cm/s   ---------  Tricuspid peak RV-RA gradient            20    mm Hg  ---------    Systemic veins                           Value        Reference  Estimated CVP                            3     mm Hg  ---------    Right ventricle  Value        Reference  RV pressure, S, DP                       23    mm Hg  <=30  Legend: (L)  and  (H)  mark values outside specified reference range.  ------------------------------------------------------------------- Prepared and Electronically Authenticated by  Thurmon Fair, MD 2017-10-03T17:23:15    Transesophageal Echocardiography  Patient:    Monisha, Siebel MR #:       [id number] Study Date: 02/18/2016 Gender:     F Age:        2 Height:     172.7 cm Weight:     91.4 kg BSA:        2.12 m^2 Pt. Status: Room:   ADMITTING    Marca Ancona, M.D.  ATTENDING    Marca Ancona, M.D.  PERFORMING   Marca Ancona, M.D.  ORDERING     Hillis Range, MD  SONOGRAPHER  Delcie Roch, RDCS,  CCT  cc:  ------------------------------------------------------------------- LV EF: 40%  ------------------------------------------------------------------- Indications:      Atrial fibrillation - 427.31.  ------------------------------------------------------------------- Study Conclusions  - Left ventricle: The cavity size was normal. Wall thickness was   normal. The estimated ejection fraction was 40%. Diffuse   hypokinesis. - Aortic valve: The aortic valve was trileaflet with mild   calcification. No stenosis. Incomplete leaflet coaptation with   probably moderate aortic insufficiency. - Mitral valve: The mitral valve did not appear structurally   abnormal. However, there was inadequate coaptation of the   leaflets with severe central mitral regurgitation. PISA ERO 0.45   cm^2. There was systolic reversal in the pulmonary vein doppler   pattern. - Left atrium: The atrium was moderately to severely dilated. No   evidence of thrombus in the atrial cavity or appendage. - Right ventricle: The cavity size was normal. Systolic function   was mildly reduced. - Right atrium: The atrium was mildly dilated. - Atrial septum: There appeared to be a small PFO by color doppler   but negative bubble study. - Tricuspid valve: Mild-moderate tricuspid regurgitation with peak   RV-RA gradient 45 mmHg. - Impressions: 1. EF 40% with diffuse hypokinesis.   2. Severe central mitral regurgitation due to inadequate leaflet   coaptation. This may be due to left atrial enlargement and   resulting annular dilatation.   3. Moderate aortic insufficiency.   4. The patient was in atrial fibrillation during the procedure.  Impressions:  - 1. EF 40% with diffuse hypokinesis.   2. Severe central mitral regurgitation due to inadequate leaflet   coaptation. This may be due to left atrial enlargement and   resulting annular dilatation.   3. Moderate aortic insufficiency.   4. The patient was in  atrial fibrillation during the procedure.  ------------------------------------------------------------------- Study data:   Study status:  Routine.  Consent:  The risks, benefits, and alternatives to the procedure were explained to the patient and informed consent was obtained.  Procedure:  Initial setup. The patient was brought to the laboratory. Surface ECG leads were monitored. Sedation. Conscious sedation was administered by cardiology staff. Transesophageal echocardiography. An adult multiplane transesophageal probe was inserted by the attending cardiologistwithout difficulty. Image quality was adequate. Intravenous contrast (agitated saline) was administered.  Study completion:  The patient tolerated the procedure well. There were no complications.  Administered medications:   Midazolam, 6mg , IV. Fentanyl, , IV.  Benadryl, 50 , IV.  Diagnostic transesophageal echocardiography.  2D and color Doppler. Birthdate:  Patient birthdate: [birth year].  Age:  Patient is 65 yr old.  Sex:  Gender: female.    BMI: 30.7 kg/m^2.  Blood pressure:   101/76  Patient status:  Outpatient.  Study date:  Study date: 02/18/2016. Study time: 08:01 AM.  Location:  Endoscopy.  -------------------------------------------------------------------  ------------------------------------------------------------------- Left ventricle:  The cavity size was normal. Wall thickness was normal. The estimated ejection fraction was 40%. Diffuse hypokinesis.  ------------------------------------------------------------------- Aortic valve:  The aortic valve was trileaflet with mild calcification. No stenosis. Incomplete leaflet coaptation with probably moderate aortic insufficiency.  ------------------------------------------------------------------- Aorta:  Normal caliber aorta with minimal plaque.  ------------------------------------------------------------------- Mitral valve:  The mitral  valve did not appear structurally abnormal. However, there was inadequate coaptation of the leaflets with severe central mitral regurgitation. PISA ERO 0.45 cm^2. There was systolic reversal in the pulmonary vein doppler pattern.  ------------------------------------------------------------------- Left atrium:  The atrium was moderately to severely dilated.  No evidence of thrombus in the atrial cavity or appendage.  ------------------------------------------------------------------- Atrial septum:  There appeared to be a small PFO by color doppler but negative bubble study.  ------------------------------------------------------------------- Right ventricle:  The cavity size was normal. Systolic function was mildly reduced.  ------------------------------------------------------------------- Pulmonic valve:    Doppler:  There was trivial regurgitation.  ------------------------------------------------------------------- Tricuspid valve:  Mild-moderate tricuspid regurgitation with peak RV-RA gradient 45 mmHg.  ------------------------------------------------------------------- Right atrium:  The atrium was mildly dilated.  ------------------------------------------------------------------- Pericardium:  There was no pericardial effusion.   ------------------------------------------------------------------- Post procedure conclusions Ascending Aorta:  - Normal caliber aorta with minimal plaque.  ------------------------------------------------------------------- Measurements   Mitral valve                    Value  Mitral regurg VTI, PISA         139   cm  Mitral regurg volume, PISA      50    ml  Legend: (L)  and  (H)  mark values outside specified reference range.  ------------------------------------------------------------------- Prepared and Electronically Authenticated by  Marca Ancona, M.D. 2017-10-27T14:57:03   Right/Left Heart Cath and  Coronary Angiography  Conclusion   1. No angiographic coronary disease.  2. Elevated PCWP but normal RA pressure.  Prominent V-waves in PCWP tracing.  3. Low cardiac output, suspect this is related to the severe mitral regurgitation that is known to be present from TEE.  LV-gram not done given TEE today and CKD.   Procedural Details/Technique   Technical Details Procedure: Right Heart Cath, Left Heart Cath, Selective Coronary Angiography  Indication: Severe mitral regurgitation.   Procedural Details: The right radial area and left brachial area were prepped, draped, and anesthetized with 1% lidocaine. There was a pre-existing peripheral IV in the left brachial vein. This was replaced with a 10F venous sheath. A Swan-Ganz catheter was used for the right heart catheterization. Standard protocol was followed for recording of right heart pressures and sampling of oxygen saturations. Fick cardiac output was calculated.The right radial artery was entered using modified Seldinger technique and a 53F sheath was placed. Standard Judkins catheters were used for selective coronary angiography. There were no immediate procedural complications. The patient was transferred to the post catheterization recovery area for further monitoring.   Estimated blood loss <50 mL.  During this procedure the patient was administered the following to achieve and maintain moderate conscious sedation: Versed 2 mg, Fentanyl 50 mcg, while the patient's heart rate, blood pressure, and oxygen saturation were continuously  monitored. The period of conscious sedation was 35 minutes, of which I was present face-to-face 100% of this time.    Coronary Findings   Dominance: Right  Left Main  No significant CAD.  Left Anterior Descending  No significant CAD.  Left Circumflex  Small artery. No significant CAD.  Right Coronary Artery  No significant CAD.  Right Heart   Right Heart Pressures RHC Procedural Findings: Hemodynamics  (mmHg) RA mean 5 RV 30/4 PA 32/21, mean 26 PCWP mean 21, prominent V-waves LV 107/13 AO 108/59  Oxygen saturations: PA 55% AO 91%  Cardiac Output (Fick) 3.9  Cardiac Index (Fick) 1.91    Wall Motion   Not done, had TEE today with clearly severe MR and has CKD.         Implants     No implant documentation for this case.  PACS Images   Show images for Cardiac catheterization   Link to Procedure Log   Procedure Log    Hemo Data   Flowsheet Row Most Recent Value  Fick Cardiac Output 3.9 L/min  Fick Cardiac Output Index 1.91 (L/min)/BSA  RA A Wave 6 mmHg  RA V Wave 5 mmHg  RA Mean 5 mmHg  RV Systolic Pressure 30 mmHg  RV Diastolic Pressure 3 mmHg  RV EDP 4 mmHg  PA Systolic Pressure 32 mmHg  PA Diastolic Pressure 21 mmHg  PA Mean 26 mmHg  PW A Wave 22 mmHg  PW V Wave 24 mmHg  PW Mean 21 mmHg  LV Systolic Pressure 107 mmHg  LV Diastolic Pressure 6 mmHg  LV EDP 13 mmHg  Arterial Occlusion Pressure Extended Systolic Pressure 108 mmHg  Arterial Occlusion Pressure Extended Diastolic Pressure 59 mmHg  Arterial Occlusion Pressure Extended Mean Pressure 79 mmHg  Left Ventricular Apex Extended Systolic Pressure 118 mmHg  Left Ventricular Apex Extended Diastolic Pressure 3 mmHg  Left Ventricular Apex Extended EDP Pressure 12 mmHg  QP/QS 1  TPVR Index 13.59 HRUI    Impression:  Patient has stage D severe symptomatic mitral regurgitation with recurrent persistent atrial fibrillation and nonischemic cardiomyopathy. She presents with worsening symptoms of exertional shortness of breath, fatigue, palpitations consistent with chronic combined systolic and diastolic congestive heart failure, New York Heart Association functional class IIIB.  Her symptoms have a poorly gotten much worse over the last few months. I have personally reviewed the patient's recent echocardiograms and diagnostic cardiac catheterization. Transthoracic and transesophageal echocardiograms revealed  severe mitral regurgitation. The mitral valve leaflets appear relatively normal with reasonably normal leaflet mobility and type I dysfunction secondary to annular dilatation and severe left ventricular systolic dysfunction. There may be some downward displacement of the mitral apparatus but there are no obvious signs of chordal thickening or fibrosis to suggest inflammatory etiology.  Diagnostic cardiac catheterization is notable for the absence of significant coronary artery disease.  This may be secondary mitral regurgitation, presumably secondary to tachycardia mediated cardiomyopathy although intrinsic myocardial disease has not been ruled out..   The patient has recurrent persistent atrial fibrillation that has failed multiple drugs and DC cardioversion on at least 2 occasions.  Under the circumstances I agree the patient might best be treated with mitral valve repair and Maze procedure.   Plan:  I have discussed the nature of the patient's cardiac problems at length with the patient and her mother with an interpreter present for her consultation today. We discussed the several different sources of the patient's symptoms of congestive heart failure. We discussed treatment alternatives  for management of severe mitral regurgitation and atrial fibrillation at length. The indications, risks, and potential benefits of mitral valve repair or replacement and Maze procedure were reviewed. Alternative surgical approaches were discussed. Expectations for the patient's postoperative convalescence were discussed. All of their questions have been answered. The patient is interested in proceeding with surgery sometime after the Thanksgiving holiday.  We plan to proceed with cardiac MRI to rule out intrinsic myocardial disease. The patient will undergo CT angiography of the aorta and iliac vessels to evaluate the feasibility of peripheral cannulation for surgery. The patient will return in approximately 2 weeks to  review the results of these tests and make final plans for surgery. All of their questions have been addressed.   I spent in excess of 90 minutes during the conduct of this office consultation and >50% of this time involved direct face-to-face encounter with the patient for counseling and/or coordination of their care.    Salvatore Decent. Cornelius Moras, MD 03/01/2016 5:36 PM

## 2016-03-02 ENCOUNTER — Other Ambulatory Visit: Payer: Self-pay | Admitting: *Deleted

## 2016-03-02 DIAGNOSIS — I428 Other cardiomyopathies: Secondary | ICD-10-CM

## 2016-03-02 DIAGNOSIS — I7100 Dissection of unspecified site of aorta: Secondary | ICD-10-CM

## 2016-03-02 DIAGNOSIS — Z01818 Encounter for other preprocedural examination: Secondary | ICD-10-CM

## 2016-03-02 DIAGNOSIS — Q262 Total anomalous pulmonary venous connection: Secondary | ICD-10-CM

## 2016-03-06 ENCOUNTER — Telehealth: Payer: Self-pay | Admitting: Thoracic Surgery (Cardiothoracic Vascular Surgery)

## 2016-03-06 NOTE — Telephone Encounter (Signed)
Called patient and left voicemail message regarding appointment on 03-10-16 for her cardiac MRI.  She should be at [facility] on that day at 7:45 a.m.

## 2016-03-08 ENCOUNTER — Telehealth: Payer: Self-pay | Admitting: Thoracic Surgery (Cardiothoracic Vascular Surgery)

## 2016-03-08 NOTE — Telephone Encounter (Signed)
Patient called via an interpreter service.  C/o tightness mid chest between breasts starting at 5 PM, has eased a little but not resolved.   No prior episodes of this nature  Advised to go to ED for evaluation  Viviann Spare C. Dorris Fetch, MD [facility] [phone]

## 2016-03-10 ENCOUNTER — Ambulatory Visit (HOSPITAL_COMMUNITY)
Admission: RE | Admit: 2016-03-10 | Discharge: 2016-03-10 | Disposition: A | Discharge status: Home / Self Care | Payer: Medicare Other | Source: Ambulatory Visit | Attending: Thoracic Surgery (Cardiothoracic Vascular Surgery) | Admitting: Thoracic Surgery (Cardiothoracic Vascular Surgery)

## 2016-03-10 DIAGNOSIS — I340 Nonrheumatic mitral (valve) insufficiency: Secondary | ICD-10-CM | POA: Diagnosis not present

## 2016-03-10 DIAGNOSIS — I351 Nonrheumatic aortic (valve) insufficiency: Secondary | ICD-10-CM | POA: Insufficient documentation

## 2016-03-10 DIAGNOSIS — I428 Other cardiomyopathies: Secondary | ICD-10-CM

## 2016-03-10 DIAGNOSIS — I429 Cardiomyopathy, unspecified: Secondary | ICD-10-CM | POA: Diagnosis not present

## 2016-03-10 DIAGNOSIS — Z01818 Encounter for other preprocedural examination: Secondary | ICD-10-CM

## 2016-03-10 DIAGNOSIS — Q262 Total anomalous pulmonary venous connection: Secondary | ICD-10-CM

## 2016-03-10 DIAGNOSIS — I4891 Unspecified atrial fibrillation: Secondary | ICD-10-CM | POA: Diagnosis not present

## 2016-03-10 MED ORDER — GADOBENATE DIMEGLUMINE 529 MG/ML IV SOLN
30.0000 mL | Freq: Once | INTRAVENOUS | Status: AC | PRN
  Administered 2016-03-10: 30 mL via INTRAVENOUS

## 2016-03-13 ENCOUNTER — Encounter: Payer: Medicare Other | Admitting: Thoracic Surgery (Cardiothoracic Vascular Surgery)

## 2016-03-13 ENCOUNTER — Ambulatory Visit
Admission: RE | Admit: 2016-03-13 | Discharge: 2016-03-13 | Disposition: A | Discharge status: Home / Self Care | Payer: Medicare Other | Source: Ambulatory Visit | Attending: Thoracic Surgery (Cardiothoracic Vascular Surgery) | Admitting: Thoracic Surgery (Cardiothoracic Vascular Surgery)

## 2016-03-13 DIAGNOSIS — I719 Aortic aneurysm of unspecified site, without rupture: Secondary | ICD-10-CM

## 2016-03-13 DIAGNOSIS — Q262 Total anomalous pulmonary venous connection: Secondary | ICD-10-CM

## 2016-03-13 DIAGNOSIS — Z01818 Encounter for other preprocedural examination: Secondary | ICD-10-CM

## 2016-03-13 DIAGNOSIS — I7100 Dissection of unspecified site of aorta: Secondary | ICD-10-CM

## 2016-03-13 MED ORDER — IOPAMIDOL (ISOVUE-370) INJECTION 76%: 50.0000 mL | Freq: Once | INTRAVENOUS | Status: DC | PRN

## 2016-03-14 ENCOUNTER — Other Ambulatory Visit: Payer: Self-pay | Admitting: *Deleted

## 2016-03-14 DIAGNOSIS — Z01818 Encounter for other preprocedural examination: Secondary | ICD-10-CM

## 2016-03-14 DIAGNOSIS — I351 Nonrheumatic aortic (valve) insufficiency: Secondary | ICD-10-CM

## 2016-03-14 DIAGNOSIS — I340 Nonrheumatic mitral (valve) insufficiency: Secondary | ICD-10-CM

## 2016-03-15 ENCOUNTER — Ambulatory Visit
Admission: RE | Admit: 2016-03-15 | Discharge: 2016-03-15 | Disposition: A | Discharge status: Home / Self Care | Payer: Medicare Other | Source: Ambulatory Visit | Attending: Endocrinology | Admitting: Endocrinology

## 2016-03-15 ENCOUNTER — Other Ambulatory Visit (HOSPITAL_COMMUNITY): Payer: Medicare Other

## 2016-03-15 DIAGNOSIS — E041 Nontoxic single thyroid nodule: Secondary | ICD-10-CM

## 2016-03-17 ENCOUNTER — Ambulatory Visit (HOSPITAL_COMMUNITY)
Admission: RE | Admit: 2016-03-17 | Discharge: 2016-03-17 | Disposition: A | Discharge status: Home / Self Care | Payer: Medicare Other | Source: Ambulatory Visit | Attending: Thoracic Surgery (Cardiothoracic Vascular Surgery) | Admitting: Thoracic Surgery (Cardiothoracic Vascular Surgery)

## 2016-03-17 DIAGNOSIS — I340 Nonrheumatic mitral (valve) insufficiency: Secondary | ICD-10-CM | POA: Diagnosis not present

## 2016-03-17 DIAGNOSIS — Z01818 Encounter for other preprocedural examination: Secondary | ICD-10-CM

## 2016-03-17 DIAGNOSIS — I701 Atherosclerosis of renal artery: Secondary | ICD-10-CM | POA: Insufficient documentation

## 2016-03-17 MED ORDER — IOPAMIDOL (ISOVUE-370) INJECTION 76%
INTRAVENOUS | Status: AC
  Administered 2016-03-17: 100 mL
  Filled 2016-03-17: qty 100

## 2016-03-20 ENCOUNTER — Encounter: Payer: Medicare Other | Admitting: Thoracic Surgery (Cardiothoracic Vascular Surgery)

## 2016-03-21 ENCOUNTER — Ambulatory Visit (INDEPENDENT_AMBULATORY_CARE_PROVIDER_SITE_OTHER): Payer: Medicare Other | Admitting: Thoracic Surgery (Cardiothoracic Vascular Surgery)

## 2016-03-21 ENCOUNTER — Encounter: Payer: Self-pay | Admitting: Thoracic Surgery (Cardiothoracic Vascular Surgery)

## 2016-03-21 VITALS — BP 117/73 | HR 97 | Resp 16 | Ht 67.5 in | Wt 203.0 lb

## 2016-03-21 DIAGNOSIS — I071 Rheumatic tricuspid insufficiency: Secondary | ICD-10-CM | POA: Insufficient documentation

## 2016-03-21 DIAGNOSIS — H9193 Unspecified hearing loss, bilateral: Secondary | ICD-10-CM

## 2016-03-21 DIAGNOSIS — I5042 Chronic combined systolic (congestive) and diastolic (congestive) heart failure: Secondary | ICD-10-CM | POA: Diagnosis not present

## 2016-03-21 DIAGNOSIS — I428 Other cardiomyopathies: Secondary | ICD-10-CM

## 2016-03-21 DIAGNOSIS — I481 Persistent atrial fibrillation: Secondary | ICD-10-CM

## 2016-03-21 DIAGNOSIS — I351 Nonrheumatic aortic (valve) insufficiency: Secondary | ICD-10-CM

## 2016-03-21 DIAGNOSIS — I4819 Other persistent atrial fibrillation: Secondary | ICD-10-CM

## 2016-03-21 DIAGNOSIS — I361 Nonrheumatic tricuspid (valve) insufficiency: Secondary | ICD-10-CM

## 2016-03-21 DIAGNOSIS — I340 Nonrheumatic mitral (valve) insufficiency: Secondary | ICD-10-CM

## 2016-03-21 NOTE — Progress Notes (Signed)
[address]       [name] [address]             [phone]     CARDIOTHORACIC SURGERY OFFICE NOTE  Referring Provider is Laurey Morale, MD PCP is [name], MD   HPI:  Patient returns to the office today for follow-up of stage D severe symptomatic mitral regurgitation with recurrent persistent atrial fibrillation, aortic insufficiency, nonischemic cardiomyopathy, and chronic combined systolic and diastolic congestive heart failure. She was originally seen in consultation on 03/01/2016. Since then she has undergone cardiac MRI and CT angiography.  Cardiac MRI revealed no evidence for myocarditis, infiltrative disease, or previous myocardial infarction. She was noted to have at least moderate aortic insufficiency. There was severe mitral regurgitation and moderate tricuspid regurgitation. Ejection fraction was measured 45%.  There was normal right ventricular size and function.  CT angiography revealed no complicating vascular features. The patient returns to the office today with her family and an interpreter present. She reports continued progression of symptoms of exertional shortness of breath but she denies any symptoms of resting shortness of breath. She cannot lay flat in bed. She has occasional brief episodes of atypical chest discomfort. She has lots of questions.   Current Outpatient Prescriptions  Medication Sig Dispense Refill  . ALPRAZolam (XANAX) 0.25 MG tablet Take 0.25 mg by mouth daily as needed for anxiety.     Marland Kitchen apixaban (ELIQUIS) 5 MG TABS tablet Take 1 tablet (5 mg total) by mouth 2 (two) times daily. 180 tablet 3  . Calcium Carbonate-Vitamin D (CALTRATE 600+D) 600-400 MG-UNIT per tablet Take 1 tablet by mouth 2 (two) times daily.     . carvedilol (COREG) 12.5 MG tablet Take 1 tablet (12.5 mg total) by mouth 2 (two) times daily. 60 tablet 3  . Cyanocobalamin (VITAMIN B-12) 5000 MCG SUBL Place 1 tablet under the tongue. For nerve pain in  feet and metabolism support    . diphenhydrAMINE (BENADRYL) 25 mg capsule Take 25 mg by mouth daily as needed for itching.     . furosemide (LASIX) 20 MG tablet Take 20 mg by mouth daily.    Marland Kitchen gabapentin (NEURONTIN) 600 MG tablet Take 600 mg by mouth 3 (three) times daily.  6  . levothyroxine (SYNTHROID, LEVOTHROID) 125 MCG tablet Take 125 mcg by mouth daily before breakfast.    . LORazepam (ATIVAN) 2 MG tablet Take 2 mg by mouth at bedtime as needed for sleep. For restful sleep     . losartan (COZAAR) 25 MG tablet Take 1 tablet (25 mg total) by mouth daily. 30 tablet 3  . montelukast (SINGULAIR) 10 MG tablet Take 10 mg by mouth daily before breakfast.     . omeprazole (PRILOSEC) 40 MG capsule Take 40 mg by mouth daily.     Marland Kitchen oxyCODONE-acetaminophen (PERCOCET/ROXICET) 5-325 MG tablet Take 1 tablet by mouth 2 (two) times daily as needed for moderate pain.     . potassium chloride (KLOR-CON) 8 MEQ tablet Take 8 mEq by mouth daily.    . promethazine (PHENERGAN) 25 MG tablet Take 25 mg by mouth daily as needed for nausea or vomiting.    Marland Kitchen tiZANidine (ZANAFLEX) 2 MG tablet Take 2 mg by mouth 2 (two) times daily as needed for muscle spasms.     . traMADol (ULTRAM) 50 MG tablet Take 50 mg by mouth every 6 (six) hours as needed for moderate pain.      No current facility-administered  medications for this visit.       Physical Exam:   BP 117/73 (BP Location: Right Arm, Patient Position: Sitting, Cuff Size: Large)   Pulse 97   Resp 16   Ht 5' 7.5" (1.715 m)   Wt 203 lb (92.1 kg)   SpO2 97% Comment: ON RA  BMI 31.33 kg/m   General:  Mildly obese but well appearing  Chest:   Clear to auscultation  CV:   Regular rate and rhythm with systolic murmur  Incisions:  n/a  Abdomen:  Soft nontender  Extremities:  Warm and well-perfused  Diagnostic Tests:  CARDIAC MRI  TECHNIQUE: The patient was scanned on a 1.5 Tesla GE magnet. A dedicated cardiac coil was used. Functional imaging was done  using Fiesta sequences. 2,3, and 4 chamber views were done to assess for RWMA's. Modified Simpson's rule using a short axis stack was used to calculate an ejection fraction on a dedicated work Research officer, trade unionstation using Circle software. The patient received 30 cc of Multihance. After 10 minutes inversion recovery sequences were used to assess for infiltration and scar tissue.  CONTRAST:  30 cc Multihance  FINDINGS: Technically difficult study. Patient was in atrial fibrillation. 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  Limited images of the lung fields showed no gross abnormalities.  Normal left ventricular size with mild diffuse hypokinesis, EF 45%. Normal wall thickness. Normal right ventricular size and systolic function. Moderate left atrial enlargement, mild right atrial enlargement. Trileaflet aortic valve with at least moderate aortic regurgitation. Mitral valve was normal in thickness and motion, there was severe central mitral regurgitation. There was moderate tricuspid regurgitation.  On delayed enhancement imaging, there was no myocardial late gadolinium enhancement (LGE).  MEASUREMENTS: MEASUREMENTS LV EDV 175 mL  LV SV 79 mL  LV EF 45%  IMPRESSION: 1. Technically difficult study due to atrial fibrillation and free-breathing sequences.  2.  Normal LV size with mild diffuse hypokinesis, EF 45%.  3.  Trileaflet aortic valve, at least moderate aortic insufficiency.  4.  Severe central mitral regurgitation.  5.  Moderate tricuspid regurgitation.  6.  Normal RV size and systolic function.  7. No myocardial LGE, so no definitive evidence for prior MI, infiltrative disease, or myocarditis.  Dalton Mclean   Electronically Signed   By: Marca Anconaalton  Mclean M.D.   On: 03/11/2016 13:58    CT ANGIOGRAPHY CHEST, ABDOMEN AND PELVIS  TECHNIQUE: Multidetector CT imaging through the chest, abdomen  and pelvis was performed using the standard protocol during bolus administration of intravenous contrast. Multiplanar reconstructed images and MIPs were obtained and reviewed to evaluate the vascular anatomy.  CONTRAST:  100 cc Isovue 370  COMPARISON:  07/29/2007 and 02/14/2006  FINDINGS: CTA CHEST FINDINGS  Cardiovascular: Ascending aortic diameters at the sinus of all saw mouth, sino-tubular junction, and ascending aorta are 3.9 cm, 3.2 cm, and 3.6 cm respectively. Aortic diameter in the arch is 3.2 cm. There is no evidence of intramural hematoma or aortic dissection. The great vessels are patent with little if any atherosclerotic change. They are moderately tortuous. The right vertebral artery is very diminutive. There is no obvious evidence of acute pulmonary thromboembolism. The descending aorta is normal in caliber. Left atrial appendage is dilated consistent with history of mitral regurgitation. There is no evidence of thrombus within its contents.  Mediastinum/Nodes: The right lobe of the thyroid gland is diminutive. There are calcifications in the left thyroid bed. No abnormal mediastinal adenopathy.  Lungs/Pleura: Lungs are very under aerated and scatter ground-glass opacities are likely related to hypoaeration. No definite consolidation or lung mass. No pneumothorax. No pleural effusion.  Musculoskeletal: There is no thoracic vertebral compression deformity. Right shoulder hemi arthroplasty is in place.  Review of the MIP images confirms the above findings.  CTA ABDOMEN AND PELVIS FINDINGS  VASCULAR  Aorta: The aorta is non aneurysmal and patent. Very minimal plaque is present in the upper and lower abdominal aorta. No significant focal narrowing.  Celiac: Patent. Branch vessels are patent. Accessory left hepatic artery anatomy.  SMA: Widely patent.  Replaced right hepatic artery anatomy.  Renals: There are 2 right renal arteries which are  patent. There is atherosclerotic calcification and plaque at the origin of the single left renal artery without significant focal narrowing.  IMA: Diminutive and patent  Inflow: Common, internal, and external iliac arteries are non aneurysmal and widely patent without significant atherosclerotic change. Left common, internal, and external iliac arteries are widely patent without significant atherosclerotic change. Proximal femoral arterial system is also widely patent.  Veins: Arterial phase precludes accurate delineation of venous structures. Hepatic veins are grossly patent. Retro aortic left renal vein anatomy is noted.  Review of the MIP images confirms the above findings.  NON-VASCULAR  Hepatobiliary: Diffuse hepatic steatosis.  Unremarkable gallbladder.  Pancreas: Unremarkable  Spleen: Unremarkable  Adrenals/Urinary Tract: Within normal limits.  Stomach/Bowel: Stomach is within normal limits. Normal appendix. No focal mass in the colon. Prominent stool burden is present throughout the length of the colon. No evidence of small-bowel obstruction.  Lymphatic: No evidence of abnormal para-aortic adenopathy.  Reproductive: Uterus is absent.  Adnexa are within normal limits.  Other: No free-fluid.  Musculoskeletal: No vertebral compression deformity.  Review of the MIP images confirms the above findings.  IMPRESSION: Thoracic aorta is non aneurysmal an widely patent. Great vessels are patent and tortuous. The  Aorta and iliac arterial vasculature are widely patent. They are non aneurysmal.  No acute process in the thorax or abdomen.   Electronically Signed   By: Jolaine Click M.D.   On: 03/17/2016 15:46   Impression:  Patient has stage D severe symptomatic secondary mitral regurgitation, at least moderate aortic insufficiency, dilated nonischemic cardiomyopathy, recurrent persistent atrial fibrillation, and moderate tricuspid  regurgitation. She presents with worsening symptoms of exertional shortness of breath, fatigue, palpitations, and atypical chest pain consistent with chronic combined systolic and diastolic congestive heart failure, New York Heart Association functional class IIIB.  Transthoracic and transesophageal echocardiograms reveal severe mitral regurgitation with type I dysfunction secondary to annular dilatation and severe left ventricular systolic dysfunction. There may be some degree of downward displacement of the mitral apparatus but there is no sign of chordal thickening or fibrosis to suggest an inflammatory etiology such as rheumatic disease. There is at least moderate aortic insufficiency and moderate tricuspid regurgitation. Diagnostic cardiac catheterization is notable for the absence of significant coronary artery disease. MRI of the heart reveals no sign of infiltrative myocardial disease and confirmed the presence of at least moderate aortic insufficiency. Under the circumstances I feel the patient would best be treated with aortic valve replacement, mitral valve repair or replacement, tricuspid valve repair, and Maze procedure. Risks associated with surgery will be somewhat elevated because of the patient's degree of left ventricular systolic dysfunction and the magnitude of the surgical procedure itself. Prognosis without surgical intervention is marginal.    Plan:  I have again reviewed the nature of the patient's complex cardiac disease  with the patient and her family using an interpreter in the office today. We discussed the long-term prognosis with medical therapy and the natural history of her underlying disease process. We discussed the risks associated with surgery and expectations for the patient's postoperative convalescence.  All of their questions have been addressed. We tentatively plan to proceed with surgery on 04/13/2016. The patient has been instructed to stop taking Eliquis 7 days prior  to surgery. She will return to our office for follow-up on 04/10/2016 prior to surgery.    I spent in excess of 30 minutes during the conduct of this office consultation and >50% of this time involved direct face-to-face encounter with the patient for counseling and/or coordination of their care.    Salvatore Decent. Cornelius Moras, MD 03/21/2016 3:57 PM

## 2016-03-21 NOTE — Patient Instructions (Addendum)
Stop taking Eliquis on Thursday 12/7

## 2016-03-22 ENCOUNTER — Other Ambulatory Visit: Payer: Self-pay | Admitting: *Deleted

## 2016-03-22 DIAGNOSIS — I351 Nonrheumatic aortic (valve) insufficiency: Secondary | ICD-10-CM

## 2016-03-22 DIAGNOSIS — I071 Rheumatic tricuspid insufficiency: Secondary | ICD-10-CM

## 2016-03-22 DIAGNOSIS — I4891 Unspecified atrial fibrillation: Secondary | ICD-10-CM

## 2016-03-22 DIAGNOSIS — I340 Nonrheumatic mitral (valve) insufficiency: Secondary | ICD-10-CM

## 2016-04-03 ENCOUNTER — Other Ambulatory Visit: Payer: Medicare Other

## 2016-04-10 ENCOUNTER — Ambulatory Visit (INDEPENDENT_AMBULATORY_CARE_PROVIDER_SITE_OTHER): Payer: Medicare Other | Admitting: Thoracic Surgery (Cardiothoracic Vascular Surgery)

## 2016-04-10 ENCOUNTER — Other Ambulatory Visit: Payer: Self-pay

## 2016-04-10 ENCOUNTER — Ambulatory Visit (HOSPITAL_COMMUNITY)
Admission: RE | Admit: 2016-04-10 | Discharge: 2016-04-10 | Disposition: A | Discharge status: Home / Self Care | Payer: Medicare Other | Source: Ambulatory Visit | Attending: Thoracic Surgery (Cardiothoracic Vascular Surgery) | Admitting: Thoracic Surgery (Cardiothoracic Vascular Surgery)

## 2016-04-10 ENCOUNTER — Encounter (HOSPITAL_COMMUNITY)
Admission: RE | Admit: 2016-04-10 | Discharge: 2016-04-10 | Disposition: A | Discharge status: Home / Self Care | Payer: Medicare Other | Source: Ambulatory Visit | Attending: Thoracic Surgery (Cardiothoracic Vascular Surgery) | Admitting: Thoracic Surgery (Cardiothoracic Vascular Surgery)

## 2016-04-10 ENCOUNTER — Ambulatory Visit (HOSPITAL_BASED_OUTPATIENT_CLINIC_OR_DEPARTMENT_OTHER)
Admission: RE | Admit: 2016-04-10 | Discharge: 2016-04-10 | Disposition: A | Discharge status: Home / Self Care | Payer: Medicare Other | Source: Ambulatory Visit | Attending: Thoracic Surgery (Cardiothoracic Vascular Surgery) | Admitting: Thoracic Surgery (Cardiothoracic Vascular Surgery)

## 2016-04-10 ENCOUNTER — Encounter: Payer: Self-pay | Admitting: Thoracic Surgery (Cardiothoracic Vascular Surgery)

## 2016-04-10 ENCOUNTER — Encounter (HOSPITAL_COMMUNITY): Payer: Self-pay

## 2016-04-10 ENCOUNTER — Other Ambulatory Visit (HOSPITAL_COMMUNITY): Payer: Self-pay | Admitting: *Deleted

## 2016-04-10 VITALS — BP 122/70 | HR 95 | Resp 20 | Ht 67.5 in | Wt 201.0 lb

## 2016-04-10 DIAGNOSIS — I351 Nonrheumatic aortic (valve) insufficiency: Secondary | ICD-10-CM

## 2016-04-10 DIAGNOSIS — I083 Combined rheumatic disorders of mitral, aortic and tricuspid valves: Secondary | ICD-10-CM

## 2016-04-10 DIAGNOSIS — I481 Persistent atrial fibrillation: Secondary | ICD-10-CM

## 2016-04-10 DIAGNOSIS — I4819 Other persistent atrial fibrillation: Secondary | ICD-10-CM

## 2016-04-10 DIAGNOSIS — I5042 Chronic combined systolic (congestive) and diastolic (congestive) heart failure: Secondary | ICD-10-CM

## 2016-04-10 DIAGNOSIS — I4891 Unspecified atrial fibrillation: Secondary | ICD-10-CM | POA: Diagnosis not present

## 2016-04-10 DIAGNOSIS — I071 Rheumatic tricuspid insufficiency: Secondary | ICD-10-CM

## 2016-04-10 DIAGNOSIS — I340 Nonrheumatic mitral (valve) insufficiency: Secondary | ICD-10-CM

## 2016-04-10 DIAGNOSIS — Z01818 Encounter for other preprocedural examination: Secondary | ICD-10-CM

## 2016-04-10 DIAGNOSIS — I361 Nonrheumatic tricuspid (valve) insufficiency: Secondary | ICD-10-CM

## 2016-04-10 DIAGNOSIS — I428 Other cardiomyopathies: Secondary | ICD-10-CM

## 2016-04-10 DIAGNOSIS — H9193 Unspecified hearing loss, bilateral: Secondary | ICD-10-CM

## 2016-04-10 HISTORY — DX: Anxiety disorder, unspecified: F41.9

## 2016-04-10 HISTORY — DX: Cardiac arrhythmia, unspecified: I49.9

## 2016-04-10 LAB — BLOOD GAS, ARTERIAL
Acid-Base Excess: 0.7 mmol/L (ref 0.0–2.0)
Bicarbonate: 24.3 mmol/L (ref 20.0–28.0)
Drawn by: 42180
FIO2: 21
O2 Saturation: 96.5 %
PATIENT TEMPERATURE: 98.6
PCO2 ART: 35.8 mmHg (ref 32.0–48.0)
PO2 ART: 91.8 mmHg (ref 83.0–108.0)
pH, Arterial: 7.447 (ref 7.350–7.450)

## 2016-04-10 LAB — COMPREHENSIVE METABOLIC PANEL
ALBUMIN: 3.7 g/dL (ref 3.5–5.0)
ALK PHOS: 89 U/L (ref 38–126)
ALT: 18 U/L (ref 14–54)
ANION GAP: 12 (ref 5–15)
AST: 19 U/L (ref 15–41)
BILIRUBIN TOTAL: 0.4 mg/dL (ref 0.3–1.2)
BUN: 17 mg/dL (ref 6–20)
CALCIUM: 9.3 mg/dL (ref 8.9–10.3)
CO2: 20 mmol/L — AB (ref 22–32)
CREATININE: 1.28 mg/dL — AB (ref 0.44–1.00)
Chloride: 105 mmol/L (ref 101–111)
GFR calc Af Amer: 49 mL/min — ABNORMAL LOW (ref >60)
GFR calc non Af Amer: 42 mL/min — ABNORMAL LOW (ref >60)
GLUCOSE: 102 mg/dL — AB (ref 65–99)
Potassium: 3.7 mmol/L (ref 3.5–5.1)
SODIUM: 137 mmol/L (ref 135–145)
TOTAL PROTEIN: 6.6 g/dL (ref 6.5–8.1)

## 2016-04-10 LAB — PULMONARY FUNCTION TEST
DL/VA % pred: 80 %
DL/VA: 4.14 ml/min/mmHg/L
DLCO UNC % PRED: 72 %
DLCO UNC: 20.57 ml/min/mmHg
FEF 25-75 PRE: 2.69 L/s
FEF 25-75 Post: 2.69 L/sec
FEF2575-%Change-Post: 0 %
FEF2575-%PRED-POST: 122 %
FEF2575-%Pred-Pre: 122 %
FEV1-%Change-Post: -1 %
FEV1-%PRED-POST: 108 %
FEV1-%Pred-Pre: 109 %
FEV1-POST: 2.86 L
FEV1-Pre: 2.89 L
FEV1FVC-%Change-Post: 1 %
FEV1FVC-%Pred-Pre: 102 %
FEV6-%CHANGE-POST: -1 %
FEV6-%PRED-POST: 105 %
FEV6-%Pred-Pre: 107 %
FEV6-PRE: 3.58 L
FEV6-Post: 3.52 L
FEV6FVC-%CHANGE-POST: 0 %
FEV6FVC-%PRED-POST: 103 %
FEV6FVC-%PRED-PRE: 102 %
FVC-%Change-Post: -2 %
FVC-%Pred-Post: 102 %
FVC-%Pred-Pre: 105 %
FVC-Post: 3.56 L
FVC-Pre: 3.65 L
POST FEV1/FVC RATIO: 80 %
POST FEV6/FVC RATIO: 99 %
Pre FEV1/FVC ratio: 79 %
Pre FEV6/FVC Ratio: 98 %
RV % PRED: 97 %
RV: 2.23 L
TLC % PRED: 106 %
TLC: 5.84 L

## 2016-04-10 LAB — CBC
HEMATOCRIT: 41 % (ref 36.0–46.0)
HEMOGLOBIN: 13.2 g/dL (ref 12.0–15.0)
MCH: 28.3 pg (ref 26.0–34.0)
MCHC: 32.2 g/dL (ref 30.0–36.0)
MCV: 88 fL (ref 78.0–100.0)
Platelets: 246 10*3/uL (ref 150–400)
RBC: 4.66 MIL/uL (ref 3.87–5.11)
RDW: 15.7 % — ABNORMAL HIGH (ref 11.5–15.5)
WBC: 10.7 10*3/uL — ABNORMAL HIGH (ref 4.0–10.5)

## 2016-04-10 LAB — URINALYSIS, ROUTINE W REFLEX MICROSCOPIC
Bilirubin Urine: NEGATIVE
Glucose, UA: NEGATIVE mg/dL
HGB URINE DIPSTICK: NEGATIVE
Ketones, ur: NEGATIVE mg/dL
NITRITE: NEGATIVE
PROTEIN: NEGATIVE mg/dL
SPECIFIC GRAVITY, URINE: 1.02 (ref 1.005–1.030)
pH: 5.5 (ref 5.0–8.0)

## 2016-04-10 LAB — VAS US DOPPLER PRE CABG
LCCADDIAS: -18 cm/s
LCCAPDIAS: 15 cm/s
LCCAPSYS: 92 cm/s
LEFT ECA DIAS: -13 cm/s
LEFT VERTEBRAL DIAS: 10 cm/s
LICADDIAS: -19 cm/s
LICADSYS: -70 cm/s
LICAPSYS: -58 cm/s
Left CCA dist sys: -74 cm/s
Left ICA prox dias: -18 cm/s
RCCADSYS: 49 cm/s
RCCAPSYS: 84 cm/s
RIGHT ECA DIAS: 11 cm/s
RIGHT VERTEBRAL DIAS: 5 cm/s
Right CCA prox dias: 15 cm/s

## 2016-04-10 LAB — URINALYSIS, MICROSCOPIC (REFLEX)
BACTERIA UA: NONE SEEN
RBC / HPF: NONE SEEN RBC/hpf (ref 0–5)

## 2016-04-10 LAB — APTT: aPTT: 29 seconds (ref 24–36)

## 2016-04-10 LAB — SURGICAL PCR SCREEN
MRSA, PCR: NEGATIVE
STAPHYLOCOCCUS AUREUS: NEGATIVE

## 2016-04-10 LAB — PROTIME-INR
INR: 0.97
Prothrombin Time: 12.9 seconds (ref 11.4–15.2)

## 2016-04-10 MED ORDER — ALBUTEROL SULFATE (2.5 MG/3ML) 0.083% IN NEBU
2.5000 mg | INHALATION_SOLUTION | Freq: Once | RESPIRATORY_TRACT | Status: AC
  Administered 2016-04-10: 2.5 mg via RESPIRATORY_TRACT

## 2016-04-10 NOTE — Pre-Procedure Instructions (Signed)
Allison [name]  04/10/2016     Your procedure is scheduled on Thursday, April 13, 2016 at 7:30 AM.   Report to [facility] Entrance "A" Admitting Office at 5:30 AM.   Call this number if you have problems the morning of surgery: [phone]   Questions prior to day of surgery, please call [phone] between 8 & 4 PM.   Remember:  Do not eat food or drink liquids after midnight Wednesday, 04/12/16.  Take these medicines the morning of surgery with A SIP OF WATER: Carvedilol (Coreg), Gabapentin (Neurontin), Levothyroxine (Synthroid), Montelukast (Singulair), Omeprazole (Prilosec), Tramadol - if needed, Alprazolam (Xanax) - if needed   Do not wear jewelry, make-up or nail polish.  Do not wear lotions, powders, perfumes, or deodorant.  Do not shave 48 hours prior to surgery.   Do not bring valuables to the hospital.  [facility] is not responsible for any belongings or valuables.  Contacts, dentures or bridgework may not be worn into surgery.  Leave your suitcase in the car.  After surgery it may be brought to your room.  For patients admitted to the hospital, discharge time will be determined by your treatment team.  Special instructions: [facility] - Preparing for Surgery  Before surgery, you can play an important role.  Because skin is not sterile, your skin needs to be as free of germs as possible.  You can reduce the number of germs on you skin by washing with CHG (chlorahexidine gluconate) soap before surgery.  CHG is an antiseptic cleaner which kills germs and bonds with the skin to continue killing germs even after washing.  Please DO NOT use if you have an allergy to CHG or antibacterial soaps.  If your skin becomes reddened/irritated stop using the CHG and inform your nurse when you arrive at Short Stay.  Do not shave (including legs and underarms) for at least 48 hours prior to the first CHG shower.  You may shave your face.  Please follow these  instructions carefully:   1.  Shower with CHG Soap the night before surgery and the                    morning of Surgery.  2.  If you choose to wash your hair, wash your hair first as usual with your       normal shampoo.  3.  After you shampoo, rinse your hair and body thoroughly to remove the  shampoo.  4.  Use CHG as you would any other liquid soap.  You can apply chg directly       to the skin and wash gently with scrungie or a clean washcloth.  5.  Apply the CHG Soap to your body ONLY FROM THE NECK DOWN.        Do not use on open wounds or open sores.  Avoid contact with your eyes, ears, mouth and genitals (private parts).  Wash genitals (private parts) with your normal soap.  6.  Wash thoroughly, paying special attention to the area where your surgery        will be performed.  7.  Thoroughly rinse your body with warm water from the neck down.  8.  DO NOT shower/wash with your normal soap after using and rinsing off       the CHG Soap.  9.  Pat yourself dry with a clean towel.  10.  Wear clean pajamas.            11.  Place clean sheets on your bed the night of your first shower and do not        sleep with pets.  Day of Surgery  Do not apply any lotions/deodorants the morning of surgery.  Please wear clean clothes to the hospital.   Please read over the fact sheets that you were given.

## 2016-04-10 NOTE — Progress Notes (Signed)
[address]       [name] [address]             [phone]     CARDIOTHORACIC SURGERY OFFICE NOTE  Referring Provider is Laurey Morale, MD PCP is [name], MD   HPI:  Patient returns to the office today for follow-up of mitral regurgitation, aortic insufficiency, tricuspid regurgitation, dilated nonischemic cardiomyopathy with chronic combined systolic and diastolic congestive heart failure, and recurrent persistent atrial fibrillation. She was last seen in our office on 03/21/2016 and she returns to our office today with plans to proceed with surgery later this week. She is seen in the office with her husband and with an interpreter present.  She stopped taking Eliquis last week in anticipation of surgery.  She continues to get short of breath with activity but she denies resting shortness of breath. She denies any fevers, chills, or productive cough. She reports chronic constipation but she had 2 bowel movements earlier today. Overall she reports no new problems or complaints.   Current Outpatient Prescriptions  Medication Sig Dispense Refill  . ALPRAZolam (XANAX) 0.25 MG tablet Take 0.25 mg by mouth 2 (two) times daily as needed for anxiety.     Marland Kitchen apixaban (ELIQUIS) 5 MG TABS tablet Take 1 tablet (5 mg total) by mouth 2 (two) times daily. 180 tablet 3  . Calcium Carbonate-Vitamin D (CALTRATE 600+D) 600-400 MG-UNIT per tablet Take 1 tablet by mouth 2 (two) times daily.     . carvedilol (COREG) 12.5 MG tablet Take 1 tablet (12.5 mg total) by mouth 2 (two) times daily. 60 tablet 3  . cefdinir (OMNICEF) 300 MG capsule Take 600 mg by mouth daily. 10 day therapy course patient began 03/30/16  0  . Cyanocobalamin (VITAMIN B-12) 5000 MCG SUBL Place 1 tablet under the tongue. For nerve pain in feet and metabolism support    . diphenhydrAMINE (BENADRYL) 25 mg capsule Take 25 mg by mouth daily as needed for itching.     . furosemide (LASIX) 20 MG tablet Take 20  mg by mouth daily.    Marland Kitchen gabapentin (NEURONTIN) 600 MG tablet Take 600 mg by mouth 3 (three) times daily.  6  . levothyroxine (SYNTHROID, LEVOTHROID) 125 MCG tablet Take 125 mcg by mouth daily before breakfast.    . LORazepam (ATIVAN) 2 MG tablet Take 2 mg by mouth at bedtime. For restful sleep     . losartan (COZAAR) 25 MG tablet Take 1 tablet (25 mg total) by mouth daily. 30 tablet 3  . montelukast (SINGULAIR) 10 MG tablet Take 10 mg by mouth daily before breakfast.     . NOREL AD 4-10-325 MG TABS Take 1 tablet by mouth 3 (three) times daily.  0  . omeprazole (PRILOSEC) 40 MG capsule Take 40 mg by mouth daily.     . potassium chloride (KLOR-CON) 8 MEQ tablet Take 8 mEq by mouth daily.    . promethazine (PHENERGAN) 25 MG tablet Take 25 mg by mouth daily as needed for nausea or vomiting.    Marland Kitchen tiZANidine (ZANAFLEX) 2 MG tablet Take 2 mg by mouth 2 (two) times daily as needed for muscle spasms.     . traMADol (ULTRAM) 50 MG tablet Take 50 mg by mouth every 6 (six) hours as needed for moderate pain.      No current facility-administered medications for this visit.       Physical Exam:   BP 122/70   Pulse  95   Resp 20   Ht 5' 7.5" (1.715 m)   Wt 201 lb (91.2 kg)   SpO2 98% Comment: RA  BMI 31.02 kg/m   General:  Well-appearing  Chest:   Clear to auscultation  CV:   Irregular rate and rhythm with systolic murmur  Incisions:  n/a  Abdomen:  Soft nontender  Extremities:  Warm and well-perfused  Diagnostic Tests:  n/a   Impression:  Patient has stage D severe symptomatic secondary mitral regurgitation, at least moderate aortic insufficiency, dilated nonischemic cardiomyopathy, recurrent persistent atrial fibrillation, and moderate tricuspid regurgitation. She presents with worsening symptoms of exertional shortness of breath, fatigue, palpitations, and atypical chest pain consistent with chronic combined systolic and diastolic congestive heart failure, [place] Heart Association  functional class IIIB.  Transthoracic and transesophageal echocardiograms reveal severe mitral regurgitation with type I dysfunction secondary to annular dilatation and severe left ventricular systolic dysfunction. There may be some degree of downward displacement of the mitral apparatus but there is no sign of chordal thickening or fibrosis to suggest an inflammatory etiology such as rheumatic disease. There is at least moderate aortic insufficiency and moderate tricuspid regurgitation. Diagnostic cardiac catheterization is notable for the absence of significant coronary artery disease. MRI of the heart reveals no sign of infiltrative myocardial disease and confirmed the presence of at least moderate aortic insufficiency. Under the circumstances I feel the patient would best be treated with aortic valve replacement, mitral valve repair or replacement, tricuspid valve repair, and Maze procedure. Risks associated with surgery will be somewhat elevated because of the patient's degree of left ventricular systolic dysfunction and the magnitude of the surgical procedure itself. Prognosis without surgical intervention is marginal.   Plan:  I have again reviewed the indications, risks, and potential benefits of surgery with the patient and her husband with an interpreter present in the office today. We discussed expectations for the patient's postoperative convalescence.   The patient understands and accepts all potential risks of surgery including but not limited to risk of death, stroke or other neurologic complication, myocardial infarction, congestive heart failure, respiratory failure, renal failure, bleeding requiring transfusion and/or reexploration, arrhythmia, infection or other wound complications, pneumonia, pleural and/or pericardial effusion, pulmonary embolus, aortic dissection or other major vascular complication, or delayed complications related to valve repair or replacement including but not limited  to structural valve deterioration and failure, thrombosis, embolization, endocarditis, or paravalvular leak, and late recurrence of atrial fibrillation or atrial flutter.  All of her questions have been answered.   I spent in excess of 30 minutes during the conduct of this office consultation and >50% of this time involved direct face-to-face encounter with the patient for counseling and/or coordination of their care.    Salvatore Decentlarence H. Cornelius Moraswen, MD 04/10/2016 12:24 PM

## 2016-04-10 NOTE — Patient Instructions (Signed)
Do not take Eliquis  Continue taking all other medications without change through the day before surgery.  Have nothing to eat or drink after midnight the night before surgery.  On the morning of surgery take only Prilosec, Synthroid and Carvedilol with a sip of water.

## 2016-04-10 NOTE — Progress Notes (Signed)
Pre-op Cardiac Surgery  Carotid Findings:  Findings suggest 1-39% internal carotid artery stenosis bilaterally. Vertebral arteries are patent with antegrade flow.  Upper Extremity Right Left  Brachial Pressures 136-Triphasic 133-Triphasic  Radial Waveforms Triphasic Triphasic  Ulnar Waveforms Triphasic Triphasic  Palmar Arch (Allen's Test) Wthin normal limits Within normal limits.    04/10/2016 3:25 PM Gertie Fey, BS, RVT, RDCS, RDMS

## 2016-04-11 LAB — HEMOGLOBIN A1C
HEMOGLOBIN A1C: 5.9 % — AB (ref 4.8–5.6)
Mean Plasma Glucose: 123 mg/dL

## 2016-04-11 NOTE — H&P (Signed)
[address]       [name] [id number]             [phone]          CARDIOTHORACIC SURGERY HISTORY AND PHYSICAL EXAM  Referring Provider is [name], [name], MD  Primary Cardiologist is [name], [name], MD PCP is [name],[name], MD      Chief Complaint  Patient presents with  . Mitral Regurgitation    EVAL FOR MVR/MAZE...02/01/16, CATH 02/18/16.SHE IS DEAF  . Atrial Fibrillation    persistent    HPI:  Patient is a 65 year old moderately obese female with history of hypertension, atrial fibrillation, nonischemic cardiomyopathy, chronic combined systolic and diastolic congestive heart failure, mitral regurgitation, atrial fibrillation, and bilateral deafness who has been referred for surgical consultation to discuss treatment options for management of severe mitral regurgitation and persistent atrial fibrillation. The patient's cardiac history dates back within 5 years ago when she first presented with paroxysmal atrial fibrillation that occurred in the setting of hyperthyroidism. She was found to have a thyroid nodule and underwent partial thyroidectomy. She has been followed intermittently ever since by [name]. In June 2015 the patient presented with shortness of breath. CT scan performed to rule out pulmonary embolus demonstrated pulmonary edema.  Echocardiogram performed at that time revealed normal left ventricular systolic function with ejection fraction estimated 55-60%. There was mild aortic insufficiency and trivial mitral regurgitation. There was mild left atrial enlargement.  She was started on diuretics with some symptomatic improvement. She returned for follow-up in May 2016 and was found to be in atrial fibrillation.  She was started on Eliquis and underwent successful cardioversion one month later but quickly went back into atrial fibrillation.  She was referred to the [facility] and started on Flecainide but developed  abdominal pain.  She was switched to Propafenone and underwent successful cardioversion, but Propafenone was later stopped due to side effects.  She was not felt to be a good candidate for Multaq.  She return to see [name] for follow-up in September 2017 and complained of worsening exertional shortness of breath, orthopnea, palpitations, and heart racing.  She was noted to be back in atrial fibrillation with rapid ventricular response.  She underwent follow-up transthoracic echocardiogram on 02/01/2016 that revealed significant drop in left ventricular systolic function with ejection fraction estimated at only 20-25%. There was severe diffuse hypokinesis with systolic dysfunction could any. There was moderate to severe mitral regurgitation and mild to moderate aortic insufficiency. There was mild to moderate tricuspid regurgitation. The patient subsequently underwent TEE and diagnostic cardiac catheterization by Dr. [name] on 02/18/2016.  TEE confirmed the presence of significant left ventricular systolic dysfunction with ejection fraction estimated at only 40% in the setting of severe mitral regurgitation. There appeared to be normal leaflet mobility the mitral valve but a broad central jet of severe mitral regurgitation. There was flow reversal in the pulmonary veins. There was moderate to severe left atrial enlargement. There was moderate aortic insufficiency. The aortic valve was trileaflet. There appeared to be a small patent foramen ovale but bubble study was negative. There was mild to moderate tricuspid regurgitation.  Cardiac catheterization was notable for the absence of significant coronary artery disease. The patient had elevated pulmonary capillary wedge pressure with prominent V waves but pulmonary artery pressures were only slightly elevated. The patient was referred for surgical consultation.  She was originally seen in consultation on 03/01/2016. Since then she has undergone  cardiac MRI and  CT angiography.  Cardiac MRI revealed no evidence for myocarditis, infiltrative disease, or previous myocardial infarction. She was noted to have at least moderate aortic insufficiency. There was severe mitral regurgitation and moderate tricuspid regurgitation. Ejection fraction was measured 45%.  There was normal right ventricular size and function.  CT angiography revealed no complicating vascular features. The patient returns to the office today with her family and an interpreter present. She reports continued progression of symptoms of exertional shortness of breath but she denies any symptoms of resting shortness of breath. She cannot lay flat in bed. She has occasional brief episodes of atypical chest discomfort. She has lots of questions.  The patient is married and lives with her husband in [place], [place].  She has deafness in both ears and is accompanied by a translator and her mother for her office consultation. Her husband is apparently not well physically. She has 4 adult children and 10 grandchildren. 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 She enjoys gardening and taking care of chores around the house but has been severely limited for the last year or 2 because of exertional shortness of breath. Symptoms have progressed fairly rapidly over the last 3 months. The patient now gets short of breath with minimal activity and occasionally at rest. She cannot lay flat in bed. She occasionally wakes up at night feeling short of breath. She has palpitations with occasional tightness across her upper chest and neck. She has not had dizzy spells or syncope. She has had some mild lower extremity edema. Prior to the development of severe shortness of breath the patient's mobility was only mildly limited because of chronic pain in both feet related to fibromyalgia. Pain in her feet has been alleviated using Neurontin.    Past Medical  History:  Diagnosis Date  . AC (acromioclavicular) joint bone spurs    spurs in shoulders with pain  . Anginal pain (HCC)   . Anxiety   . Aortic insufficiency   . Arthritis    all over  . Asthma    from reflux  . Chronic combined systolic and diastolic CHF (congestive heart failure) (HCC)   . Congestive heart failure (CHF) (HCC)   . Deaf   . Deafness   . Dysrhythmia   . Fibromyalgia   . GERD (gastroesophageal reflux disease)   . Hypertension   . Hypothyroidism   . Mitral regurgitation   . Non-ischemic cardiomyopathy (HCC)   . Persistent atrial fibrillation (HCC)   . PONV (postoperative nausea and vomiting)   . Tricuspid regurgitation     Past Surgical History:  Procedure Laterality Date  . ABDOMINAL HYSTERECTOMY     partial  . CARDIAC CATHETERIZATION N/A 02/18/2016   Procedure: Right/Left Heart Cath and Coronary Angiography;  Surgeon: Laurey Morale, MD;  Location: [facility] INVASIVE CV LAB;  Service: Cardiovascular;  Laterality: N/A;  . CARDIOVERSION N/A 10/16/2014   Procedure: CARDIOVERSION;  Surgeon: Corky Crafts, MD;  Location: [facility] ENDOSCOPY;  Service: Cardiovascular;  Laterality: N/A;  . CARDIOVERSION N/A 12/04/2014   Procedure: CARDIOVERSION;  Surgeon: Thurmon Fair, MD;  Location: [facility] ENDOSCOPY;  Service: Cardiovascular;  Laterality: N/A;  . COLON SURGERY     spurs  . COLONOSCOPY  [id number]  . COLONOSCOPY WITH PROPOFOL  04/09/2012   Procedure: COLONOSCOPY WITH PROPOFOL;  Surgeon: Charolett Bumpers, MD;  Location: [facility] ENDOSCOPY;  Service: Endoscopy;  Laterality: N/A;  . ESOPHAGOGASTRODUODENOSCOPY ENDOSCOPY  several times  . EYE SURGERY     catarcts  . TEE WITHOUT CARDIOVERSION N/A 02/18/2016   Procedure: TRANSESOPHAGEAL ECHOCARDIOGRAM (TEE);  Surgeon: Laurey Morale, MD;  Location: [facility] ENDOSCOPY;  Service: Cardiovascular;  Laterality: N/A;  . THYROID SURGERY  1991  . TONSILLECTOMY    . TOTAL SHOULDER ARTHROPLASTY Right 07/02/2012   Procedure: TOTAL SHOULDER  ARTHROPLASTY;  Surgeon: Mable Paris, MD;  Location: [facility] OR;  Service: Orthopedics;  Laterality: Right;  Right total shoulder arthroplasty  . TUBAL LIGATION    . vericose surgery Right leg  05-1998  . WISDOM TOOTH EXTRACTION      Family History  Problem Relation Age of Onset  . Lung cancer Father   . Arrhythmia Father   . Hypertension Father   . Arrhythmia Mother   . Hypertension Mother   . Stroke Maternal Grandmother   . Hypertension Maternal Grandmother   . Heart attack Maternal Uncle     X2  . Kidney disease Paternal Grandfather     Social History Social History  Substance Use Topics  . Smoking status: Never Smoker  . Smokeless tobacco: Never Used  . Alcohol use No    Prior to Admission medications   Medication Sig Start Date End Date Taking? Authorizing Provider  ALPRAZolam (XANAX) 0.25 MG tablet Take 0.25 mg by mouth 2 (two) times daily as needed for anxiety.  08/20/13  Yes Historical Provider, MD  apixaban (ELIQUIS) 5 MG TABS tablet Take 1 tablet (5 mg total) by mouth 2 (two) times daily. 05/25/15  Yes Corky Crafts, MD  Calcium Carbonate-Vitamin D (CALTRATE 600+D) 600-400 MG-UNIT per tablet Take 1 tablet by mouth 2 (two) times daily.    Yes Historical Provider, MD  carvedilol (COREG) 12.5 MG tablet Take 1 tablet (12.5 mg total) by mouth 2 (two) times daily. 02/10/16 05/10/16 Yes Hillis Range, MD  Cyanocobalamin (VITAMIN B-12) 5000 MCG SUBL Place 1 tablet under the tongue. For nerve pain in feet and metabolism support   Yes Historical Provider, MD  diphenhydrAMINE (BENADRYL) 25 mg capsule Take 25 mg by mouth daily as needed for itching.    Yes Historical Provider, MD  furosemide (LASIX) 20 MG tablet Take 20 mg by mouth daily.   Yes Historical Provider, MD  gabapentin (NEURONTIN) 600 MG tablet Take 600 mg by mouth 3 (three) times daily. 09/03/14  Yes Historical Provider, MD  levothyroxine (SYNTHROID, LEVOTHROID) 125 MCG tablet Take 125 mcg by mouth daily before  breakfast.   Yes Historical Provider, MD  LORazepam (ATIVAN) 2 MG tablet Take 2 mg by mouth at bedtime. For restful sleep    Yes Historical Provider, MD  losartan (COZAAR) 25 MG tablet Take 1 tablet (25 mg total) by mouth daily. 02/10/16 05/10/16 Yes Hillis Range, MD  montelukast (SINGULAIR) 10 MG tablet Take 10 mg by mouth daily before breakfast.    Yes Historical Provider, MD  omeprazole (PRILOSEC) 40 MG capsule Take 40 mg by mouth daily.  07/12/13  Yes Historical Provider, MD  potassium chloride (KLOR-CON) 8 MEQ tablet Take 8 mEq by mouth daily.   Yes Historical Provider, MD  promethazine (PHENERGAN) 25 MG tablet Take 25 mg by mouth daily as needed for nausea or vomiting.   Yes Historical Provider, MD  tiZANidine (ZANAFLEX) 2 MG tablet Take 2 mg by mouth 2 (two) times daily as needed for muscle spasms.  11/02/14  Yes Historical Provider, MD  traMADol (ULTRAM) 50 MG tablet Take 50 mg by mouth every 6 (  six) hours as needed for moderate pain.  01/15/15  Yes Historical Provider, MD  cefdinir (OMNICEF) 300 MG capsule Take 600 mg by mouth daily. 10 day therapy course patient began 03/30/16 03/30/16   Historical Provider, MD  NOREL AD 4-10-325 MG TABS Take 1 tablet by mouth 3 (three) times daily. 03/31/16   Historical Provider, MD    Allergies  Allergen Reactions  . Aleve [Naproxen] Other (See Comments)    Stomach ache  . Bextra [Valdecoxib] Other (See Comments)    Stomach ache   . Biaxin [Clarithromycin] Other (See Comments)    Dizzy, blurred vision, achy stomach  . Doxycycline Other (See Comments)    Stomach ache  . Durabac [Apap-Salicyl-Phenyltolox-Caff] Nausea And Vomiting  . Estradiol Other (See Comments)    Hurt stomach  . Levaquin [Levofloxacin] Other (See Comments)    Dizzy, achy stomach, can't sleep  . Macrobid [Nitrofurantoin] Other (See Comments)    bloating  . Motrin [Ibuprofen] Other (See Comments)    Stomach ahce  . Oxaprozin Other (See Comments)    Hurt stomach  . Robaxin  [Methocarbamol] Other (See Comments)    Hurt stomach  . Sulfa Antibiotics Other (See Comments)    Blister on large toe  . Topamax [Topiramate] Nausea And Vomiting  . Trazodone And Nefazodone Other (See Comments)    Weakness in legs, numbness in arms, funny feeling, rapid heart beat, loose focus       Review of Systems:              General:                      normal appetite, decreased energy, + weight gain, no weight loss, no fever             Cardiac:                       no chest pain with exertion, + occasional brief episodes of chest pain at rest, +SOB with exertion, + occasional resting SOB, + PND, + orthopnea, + palpitations, + arrhythmia, + atrial fibrillation, + LE edema, no dizzy spells, no syncope             Respiratory:                 + shortness of breath, no home oxygen, no productive cough, no dry cough, no bronchitis, no wheezing, no hemoptysis, no asthma, no pain with inspiration or cough, no sleep apnea, no CPAP at night             GI:                               no difficulty swallowing, no reflux, no frequent heartburn, no hiatal hernia, no abdominal pain, no constipation, no diarrhea, no hematochezia, no hematemesis, no melena             GU:                              no dysuria,  no frequency, must stand up to void urine due to 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, no urinary tract infection, no hematuria, no kidney stones, no kidney disease             Vascular:  no pain suggestive of claudication, + pain in feet, no leg cramps, no varicose veins, no DVT, no non-healing foot ulcer             Neuro:                         no stroke, no TIA's, no seizures, no headaches, no temporary blindness one eye,  no slurred speech, + peripheral neuropathy, + chronic pain, no instability of gait, mild memory/cognitive dysfunction             Musculoskeletal:         + mild arthritis, no joint swelling, + myalgias, mild difficulty  walking, slightly limited mobility              Skin:                            no rash, no itching, no skin infections, no pressure sores or ulcerations             Psych:                         + anxiety, no depression, + nervousness, no unusual recent stress             Eyes:                           no blurry vision, no floaters, no recent vision changes, does not wears glasses or contacts             ENT:                            + severe bilateral hearing loss, no loose or painful teeth, no dentures, last saw dentist within the past year             Hematologic:               + easy bruising, no abnormal bleeding, no clotting disorder, no frequent epistaxis             Endocrine:                   no diabetes, does not check CBG's at home                           Physical Exam:              BP 125/72   Pulse 80   Resp 16   Ht 5' 7.5" (1.715 m)   Wt 203 lb (92.1 kg)   SpO2 96% Comment: ON RA  BMI 31.33 kg/m              General:                      Moderately obese,  well-appearing             HEENT:                       Unremarkable              Neck:  no JVD, no bruits, no adenopathy              Chest:                          clear to auscultation, symmetrical breath sounds, no wheezes, no rhonchi              CV:                              Irregular rate and rhythm, + systolic murmur              Abdomen:                    soft, non-tender, no masses              Extremities:                 warm, well-perfused, pulses diminished, no LE edema             Rectal/GU                   Deferred             Neuro:                         Grossly non-focal and symmetrical throughout             Skin:                            Clean and dry, no rashes, no breakdown   Diagnostic Tests:  Transthoracic Echocardiography  Patient: Allison [name], Allison [name] MR #: [id number] Study Date: 02/01/2016 Gender: F Age: 40 Height:  172.7 cm Weight: 92.4 kg BSA: 2.13 m^2 Pt. Status: Room:  SONOGRAPHER Cathie Beams Deboraha Sprang PERFORMING [facility], Inpatient  cc:  ------------------------------------------------------------------- LV EF: 20% - 25%  ------------------------------------------------------------------- Indications: Atrial fibrillation - 427.31.  ------------------------------------------------------------------- History: PMH: Edema. Dyspnea.  ------------------------------------------------------------------- Study Conclusions  - Left ventricle: The cavity size was normal. Wall thickness was normal. Systolic function was severely reduced. The estimated ejection fraction was in the range of 20% to 25%. Severe diffuse hypokinesis. There is profound systolic dyssynchrony, but no clear cut coronary-distribution regional wall motion abnormalities. - Ventricular septum: Septal motion showed paradox. These changes are consistent with a left bundle branch block. - Aortic valve: There was mild to moderate regurgitation directed centrally in the LVOT. - Mitral valve: There was moderate to severe regurgitation directed centrally. - Left atrium: The atrium was mildly dilated. - Tricuspid valve: There was mild-moderate regurgitation.  ------------------------------------------------------------------- Study data: Comparison was made to the study of 10/22/2013. Study status: Routine. Procedure: Transthoracic echocardiography. Image quality was adequate. Study completion: There were no complications. Transthoracic echocardiography. M-mode, complete 2D, spectral Doppler, and color Doppler. Birthdate: Patient birthdate: 1950-11-29. Age: Patient is 65 yr old. Sex: Gender: female. BMI: 31 kg/m^2. Blood pressure: 125/80 Patient status: Outpatient. Study date: Study date: 02/01/2016. Study time: 11:20 AM.  Location: Echo laboratory.  -------------------------------------------------------------------  ------------------------------------------------------------------- Left ventricle: The cavity size was normal. Wall thickness was normal. Systolic function was severely reduced. The estimated ejection fraction was in the range of 20% to 25%. Severe diffuse hypokinesis. The study was not technically sufficient to allow evaluation of LV diastolic dysfunction due to atrial  fibrillation.  ------------------------------------------------------------------- Aortic valve: Trileaflet; normal thickness leaflets. Doppler: There was no stenosis. There was mild to moderate regurgitation directed centrally in the LVOT.  ------------------------------------------------------------------- Aorta: Aortic root: The aortic root was normal in size. Ascending aorta: The ascending aorta was normal in size.  ------------------------------------------------------------------- Mitral valve: Structurally normal valve. Leaflet separation was normal. Doppler: Transvalvular velocity was within the normal range. There was no evidence for stenosis. There was moderate to severe regurgitation directed centrally.  ------------------------------------------------------------------- Left atrium: The atrium was mildly dilated.  ------------------------------------------------------------------- Right ventricle: The cavity size was normal. Systolic function was normal.  ------------------------------------------------------------------- Ventricular septum: Septal motion showed paradox. These changes are consistent with a left bundle branch block.  ------------------------------------------------------------------- Pulmonic valve: Poorly visualized. The valve appears to be grossly normal. Doppler: There was no  significant regurgitation.  ------------------------------------------------------------------- Tricuspid valve: Structurally normal valve. Leaflet separation was normal. Doppler: Transvalvular velocity was within the normal range. There was mild-moderate regurgitation.  ------------------------------------------------------------------- Pulmonary artery: Systolic pressure was within the normal range.  ------------------------------------------------------------------- Right atrium: The atrium was normal in size.  ------------------------------------------------------------------- Pericardium: There was no pericardial effusion.  ------------------------------------------------------------------- Measurements  Left ventricle Value Reference LV ID, ED, PLAX chordal 44.4 mm 43 - 52 LV ID, ES, PLAX chordal (H) 38.1 mm 23 - 38 LV fx shortening, PLAX chordal (L) 14 % >=29 LV PW thickness, ED 13.1 mm --------- IVS/LV PW ratio, ED 0.72 <=1.3  Ventricular septum Value Reference IVS thickness, ED 9.41 mm ---------  LVOT Value Reference LVOT ID, S 21 mm --------- LVOT area 3.46 cm^2 ---------  Aortic valve Value Reference Aortic regurg peak velocity 371 cm/s --------- Aortic regurg pressure half-time 608 ms --------- Aortic regurg peak gradient 55 mm Hg ---------  Aorta Value Reference Aortic root ID, ED 34 mm ---------  Left atrium Value Reference LA  ID, A-P, ES 41 mm --------- LA ID/bsa, A-P 1.92 cm/m^2 <=2.2 LA volume, S 44.4 ml --------- LA volume/bsa, S 20.8 ml/m^2 --------- LA volume, ES, 1-p A4C 60.5 ml --------- LA volume/bsa, ES, 1-p A4C 28.4 ml/m^2 --------- LA volume, ES, 1-p A2C 29 ml --------- LA volume/bsa, ES, 1-p A2C 13.6 ml/m^2 ---------  Mitral valve Value Reference Mitral regurg VTI, PISA 136 cm ---------  Pulmonary arteries Value Reference PA pressure, S, DP 23 mm Hg <=30  Tricuspid valve Value Reference Tricuspid regurg peak velocity 221 cm/s --------- Tricuspid peak RV-RA gradient 20 mm Hg ---------  Systemic veins Value Reference Estimated CVP 3 mm Hg ---------  Right ventricle Value Reference RV pressure, S, DP 23 mm Hg <=30  Legend: (L) and (H) mark values outside specified reference range.  ------------------------------------------------------------------- Prepared and Electronically Authenticated by  Thurmon Fair, MD 2017-10-03T17:23:15    Transesophageal Echocardiography  Patient: Dally, Allison [name] MR #: [id number] Study Date: 02/18/2016 Gender: F Age: 83 Height: 172.7 cm Weight: 91.4 kg BSA: 2.12 m^2 Pt. Status: Room:  ADMITTING Marca Ancona, M.D. ATTENDING Marca Ancona, M.D. PERFORMING Marca Ancona, M.D. ORDERING Hillis Range, MD SONOGRAPHER Delcie Roch, RDCS,  CCT  cc:  ------------------------------------------------------------------- LV EF: 40%  ------------------------------------------------------------------- Indications: Atrial fibrillation - 427.31.  ------------------------------------------------------------------- Study Conclusions  - Left ventricle: The cavity size was normal. Wall thickness was normal. The estimated ejection fraction was 40%. Diffuse hypokinesis. - Aortic valve: The aortic valve was trileaflet with mild calcification. No stenosis. Incomplete leaflet coaptation with probably moderate aortic insufficiency. - Mitral valve: The mitral valve did not appear structurally abnormal. However, there was inadequate coaptation of the leaflets with severe central mitral regurgitation.  PISA ERO 0.45 cm^2. There was systolic reversal in the pulmonary vein doppler pattern. - Left atrium: The atrium was moderately to severely dilated. No evidence of thrombus in the atrial cavity or appendage. - Right ventricle: The cavity size was normal. Systolic function was mildly reduced. - Right atrium: The atrium was mildly dilated. - Atrial septum: There appeared to be a small PFO by color doppler but negative bubble study. - Tricuspid valve: Mild-moderate tricuspid regurgitation with peak RV-RA gradient 45 mmHg. - Impressions: 1. EF 40% with diffuse hypokinesis. 2. Severe central mitral regurgitation due to inadequate leaflet coaptation. This may be due to left atrial enlargement and resulting annular dilatation. 3. Moderate aortic insufficiency. 4. The patient was in atrial fibrillation during the procedure.  Impressions:  - 1. EF 40% with diffuse hypokinesis. 2. Severe central mitral regurgitation due to inadequate leaflet coaptation. This may be due to left atrial enlargement and resulting annular dilatation. 3. Moderate aortic insufficiency. 4. The patient was in  atrial fibrillation during the procedure.  ------------------------------------------------------------------- Study data: Study status: Routine. Consent: The risks, benefits, and alternatives to the procedure were explained to the patient and informed consent was obtained. Procedure: Initial setup. The patient was brought to the laboratory. Surface ECG leads were monitored. Sedation. Conscious sedation was administered by cardiology staff. Transesophageal echocardiography. An adult multiplane transesophageal probe was inserted by the attending cardiologistwithout difficulty. Image quality was adequate. Intravenous contrast (agitated saline) was administered. Study completion: The patient tolerated the procedure well. There were no complications. Administered medications: Midazolam, 6mg , IV. Fentanyl, , IV. Benadryl, 50 , IV. Diagnostic transesophageal echocardiography. 2D and color Doppler. Birthdate: Patient birthdate: 08/29/1950. Age: Patient is 65 yr old. Sex: Gender: female. BMI: 30.7 kg/m^2. Blood pressure: 101/76 Patient status: Outpatient. Study date: Study date: 02/18/2016. Study time: 08:01 AM. Location: Endoscopy.  -------------------------------------------------------------------  ------------------------------------------------------------------- Left ventricle: The cavity size was normal. Wall thickness was normal. The estimated ejection fraction was 40%. Diffuse hypokinesis.  ------------------------------------------------------------------- Aortic valve: The aortic valve was trileaflet with mild calcification. No stenosis. Incomplete leaflet coaptation with probably moderate aortic insufficiency.  ------------------------------------------------------------------- Aorta: Normal caliber aorta with minimal plaque.  ------------------------------------------------------------------- Mitral valve: The mitral  valve did not appear structurally abnormal. However, there was inadequate coaptation of the leaflets with severe central mitral regurgitation. PISA ERO 0.45 cm^2. There was systolic reversal in the pulmonary vein doppler pattern.  ------------------------------------------------------------------- Left atrium: The atrium was moderately to severely dilated. No evidence of thrombus in the atrial cavity or appendage.  ------------------------------------------------------------------- Atrial septum: There appeared to be a small PFO by color doppler but negative bubble study.  ------------------------------------------------------------------- Right ventricle: The cavity size was normal. Systolic function was mildly reduced.  ------------------------------------------------------------------- Pulmonic valve: Doppler: There was trivial regurgitation.  ------------------------------------------------------------------- Tricuspid valve: Mild-moderate tricuspid regurgitation with peak RV-RA gradient 45 mmHg.  ------------------------------------------------------------------- Right atrium: The atrium was mildly dilated.  ------------------------------------------------------------------- Pericardium: There was no pericardial effusion.  ------------------------------------------------------------------- Post procedure conclusions Ascending Aorta:  - Normal caliber aorta with minimal plaque.  ------------------------------------------------------------------- Measurements  Mitral valve Value Mitral regurg VTI, PISA 139 cm Mitral regurg volume, PISA 50 ml  Legend: (L) and (H) mark values outside specified reference range.  ------------------------------------------------------------------- Prepared and Electronically Authenticated by  Marca Ancona, M.D. 2017-10-27T14:57:03   Right/Left Heart Cath and  Coronary Angiography  Conclusion   1. No angiographic coronary disease.  2. Elevated PCWP but normal RA pressure. Prominent V-waves in PCWP tracing.  3. Low cardiac output, suspect this is related to the severe mitral regurgitation  that is known to be present from TEE. LV-gram not done given TEE today and CKD.   Procedural Details/Technique   Technical Details Procedure: Right Heart Cath, Left Heart Cath, Selective Coronary Angiography  Indication: Severe mitral regurgitation.   Procedural Details: The right radial area and left brachial area were prepped, draped, and anesthetized with 1% lidocaine. There was a pre-existing peripheral IV in the left brachial vein. This was replaced with a 61F venous sheath. A Swan-Ganz catheter was used for the right heart catheterization. Standard protocol was followed for recording of right heart pressures and sampling of oxygen saturations. Fick cardiac output was calculated.The right radial artery was entered using modified Seldinger technique and a 34F sheath was placed. Standard Judkins catheters were used for selective coronary angiography. There were no immediate procedural complications. The patient was transferred to the post catheterization recovery area for further monitoring.   Estimated blood loss <50 mL.  During this procedure the patient was administered the following to achieve and maintain moderate conscious sedation: Versed 2 mg, Fentanyl 50 mcg, while the patient's heart rate, blood pressure, and oxygen saturation were continuously monitored. The period of conscious sedation was 35 minutes, of which I was present face-to-face 100% of this time.    Coronary Findings   Dominance: Right  Left Main  No significant CAD.  Left Anterior Descending  No significant CAD.  Left Circumflex  Small artery. No significant CAD.  Right Coronary Artery  No significant CAD.  Right Heart   Right Heart Pressures [facility] Procedural  Findings: Hemodynamics (mmHg) RA mean 5 RV 30/4 PA 32/21, mean 26 PCWP mean 21, prominent V-waves LV 107/13 AO 108/59  Oxygen saturations: PA 55% AO 91%  Cardiac Output (Fick) 3.9  Cardiac Index (Fick) 1.91    Wall Motion   Not done, had TEE today with clearly severe MR and has CKD.         Implants        No implant documentation for this case.  PACS Images   Show images for Cardiac catheterization   Link to Procedure Log   Procedure Log    Hemo Data   Flowsheet Row Most Recent Value  Fick Cardiac Output 3.9 L/min  Fick Cardiac Output Index 1.91 (L/min)/BSA  RA A Wave 6 mmHg  RA V Wave 5 mmHg  RA Mean 5 mmHg  RV Systolic Pressure 30 mmHg  RV Diastolic Pressure 3 mmHg  RV EDP 4 mmHg  PA Systolic Pressure 32 mmHg  PA Diastolic Pressure 21 mmHg  PA Mean 26 mmHg  PW A Wave 22 mmHg  PW V Wave 24 mmHg  PW Mean 21 mmHg  LV Systolic Pressure 107 mmHg  LV Diastolic Pressure 6 mmHg  LV EDP 13 mmHg  Arterial Occlusion Pressure Extended Systolic Pressure 108 mmHg  Arterial Occlusion Pressure Extended Diastolic Pressure 59 mmHg  Arterial Occlusion Pressure Extended Mean Pressure 79 mmHg  Left Ventricular Apex Extended Systolic Pressure 118 mmHg  Left Ventricular Apex Extended Diastolic Pressure 3 mmHg  Left Ventricular Apex Extended EDP Pressure 12 mmHg  QP/QS 1  TPVR Index 13.59 HRUI   CARDIAC MRI  TECHNIQUE: The patient was scanned on a 1.5 Tesla GE magnet. A dedicated cardiac coil was used. Functional imaging was done using Fiesta sequences. 2,3, and 4 chamber views were done to assess for RWMA's. Modified Simpson's rule using a short axis stack was used to calculate an ejection fraction on a dedicated work [employer]. The patient  received 30 cc of Multihance. After 10 minutes inversion recovery sequences were used to assess for infiltration and scar tissue.  CONTRAST: 30 cc Multihance  FINDINGS: Technically difficult  study. Patient was in atrial fibrillation. 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  Limited images of the lung fields showed no gross abnormalities.  Normal left ventricular size with mild diffuse hypokinesis, EF 45%. Normal wall thickness. Normal right ventricular size and systolic function. Moderate left atrial enlargement, mild right atrial enlargement. Trileaflet aortic valve with at least moderate aortic regurgitation. Mitral valve was normal in thickness and motion, there was severe central mitral regurgitation. There was moderate tricuspid regurgitation.  On delayed enhancement imaging, there was no myocardial late gadolinium enhancement (LGE).  MEASUREMENTS: MEASUREMENTS LV EDV 175 mL  LV SV 79 mL  LV EF 45%  IMPRESSION: 1. Technically difficult study due to atrial fibrillation and free-breathing sequences.  2. Normal LV size with mild diffuse hypokinesis, EF 45%.  3. Trileaflet aortic valve, at least moderate aortic insufficiency.  4. Severe central mitral regurgitation.  5. Moderate tricuspid regurgitation.  6. Normal RV size and systolic function.  7. No myocardial LGE, so no definitive evidence for prior MI, infiltrative disease, or myocarditis.  Dalton Mclean   Electronically Signed By: Marca Ancona M.D. On: 03/11/2016 13:58    CT ANGIOGRAPHY CHEST, ABDOMEN AND PELVIS  TECHNIQUE: Multidetector CT imaging through the chest, abdomen and pelvis was performed using the standard protocol during bolus administration of intravenous contrast. Multiplanar reconstructed images and MIPs were obtained and reviewed to evaluate the vascular anatomy.  CONTRAST: 100 cc Isovue 370  COMPARISON: 07/29/2007 and 02/14/2006  FINDINGS: CTA CHEST FINDINGS  Cardiovascular: Ascending aortic diameters at the sinus of all saw mouth, sino-tubular junction, and ascending  aorta are 3.9 cm, 3.2 cm, and 3.6 cm respectively. Aortic diameter in the arch is 3.2 cm. There is no evidence of intramural hematoma or aortic dissection. The great vessels are patent with little if any atherosclerotic change. They are moderately tortuous. The right vertebral artery is very diminutive. There is no obvious evidence of acute pulmonary thromboembolism. The descending aorta is normal in caliber. Left atrial appendage is dilated consistent with history of mitral regurgitation. There is no evidence of thrombus within its contents.  Mediastinum/Nodes: The right lobe of the thyroid gland is diminutive. There are calcifications in the left thyroid bed. No abnormal mediastinal adenopathy.  Lungs/Pleura: Lungs are very under aerated and scatter ground-glass opacities are likely related to hypoaeration. No definite consolidation or lung mass. No pneumothorax. No pleural effusion.  Musculoskeletal: There is no thoracic vertebral compression deformity. Right shoulder hemi arthroplasty is in place.  Review of the MIP images confirms the above findings.  CTA ABDOMEN AND PELVIS FINDINGS  VASCULAR  Aorta: The aorta is non aneurysmal and patent. Very minimal plaque is present in the upper and lower abdominal aorta. No significant focal narrowing.  Celiac: Patent. Branch vessels are patent. Accessory left hepatic artery anatomy.  SMA: Widely patent. Replaced right hepatic artery anatomy.  Renals: There are 2 right renal arteries which are patent. There is atherosclerotic calcification and plaque at the origin of the single left renal artery without significant focal narrowing.  IMA: Diminutive and patent  Inflow: Common, internal, and external iliac arteries are non aneurysmal and widely patent without significant atherosclerotic change. Left common, internal, and external iliac arteries are widely patent without significant atherosclerotic change.  Proximal femoral arterial system is also  widely patent.  Veins: Arterial phase precludes accurate delineation of venous structures. Hepatic veins are grossly patent. Retro aortic left renal vein anatomy is noted.  Review of the MIP images confirms the above findings.  NON-VASCULAR  Hepatobiliary: Diffuse hepatic steatosis. Unremarkable gallbladder.  Pancreas: Unremarkable  Spleen: Unremarkable  Adrenals/Urinary Tract: Within normal limits.  Stomach/Bowel: Stomach is within normal limits. Normal appendix. No focal mass in the colon. Prominent stool burden is present throughout the length of the colon. No evidence of small-bowel obstruction.  Lymphatic: No evidence of abnormal para-aortic adenopathy.  Reproductive: Uterus is absent. Adnexa are within normal limits.  Other: No free-fluid.  Musculoskeletal: No vertebral compression deformity.  Review of the MIP images confirms the above findings.  IMPRESSION: Thoracic aorta is non aneurysmal an widely patent. Great vessels are patent and tortuous. The  Aorta and iliac arterial vasculature are widely patent. They are non aneurysmal.  No acute process in the thorax or abdomen.   Electronically Signed By: Jolaine Click M.D. On: 03/17/2016 15:46   Impression:  Patient has stage D severe symptomatic secondary mitral regurgitation, at least moderate aortic insufficiency, dilated nonischemic cardiomyopathy, recurrent persistent atrial fibrillation, and moderate tricuspid regurgitation. She presents with worsening symptoms of exertional shortness of breath, fatigue, palpitations, and atypical chest pain consistent with chronic combined systolic and diastolic congestive heart failure, New York Heart Association functional class IIIB.  Transthoracic and transesophageal echocardiograms reveal severe mitral regurgitation with type I dysfunction secondary to annular dilatation and severe left ventricular  systolic dysfunction. There may be some degree of downward displacement of the mitral apparatus but there is no sign of chordal thickening or fibrosis to suggest an inflammatory etiology such as rheumatic disease. There is at least moderate aortic insufficiency and moderate tricuspid regurgitation. Diagnostic cardiac catheterization is notable for the absence of significant coronary artery disease. MRI of the heart reveals no sign of infiltrative myocardial disease and confirmed the presence of at least moderate aortic insufficiency. Under the circumstances I feel the patient would best be treated with aortic valve replacement, mitral valve repair or replacement, tricuspid valve repair, and Maze procedure. Risks associated with surgery will be somewhat elevated because of the patient's degree of left ventricular systolic dysfunction and the magnitude of the surgical procedure itself. Prognosis without surgical intervention is marginal.   Plan:  I have again reviewed the indications, risks, and potential benefits of surgery with the patient and her husband with an interpreter present in the office today. We discussed expectations for the patient's postoperative convalescence.   The patient understands and accepts all potential risks of surgery including but not limited to risk of death, stroke or other neurologic complication, myocardial infarction, congestive heart failure, respiratory failure, renal failure, bleeding requiring transfusion and/or reexploration, arrhythmia, infection or other wound complications, pneumonia, pleural and/or pericardial effusion, pulmonary embolus, aortic dissection or other major vascular complication, or delayed complications related to valve repair or replacement including but not limited to structural valve deterioration and failure, thrombosis, embolization, endocarditis, or paravalvular leak, and late recurrence of atrial fibrillation or atrial flutter.  All of her  questions have been answered.   I spent in excess of 30 minutes during the conduct of this office consultation and >50% of this time involved direct face-to-face encounter with the patient for counseling and/or coordination of their care.    Salvatore Decent. Cornelius Moras, MD 04/10/2016 12:24 PM

## 2016-04-12 MED ORDER — TRANEXAMIC ACID 1000 MG/10ML IV SOLN
1.5000 mg/kg/h | INTRAVENOUS | Status: AC
  Administered 2016-04-13: 1.5 mg/kg/h via INTRAVENOUS
  Filled 2016-04-12: qty 25

## 2016-04-12 MED ORDER — GLUTARALDEHYDE 0.625% SOAKING SOLUTION
TOPICAL | Status: DC | PRN
  Filled 2016-04-12: qty 50

## 2016-04-12 MED ORDER — MAGNESIUM SULFATE 50 % IJ SOLN
40.0000 meq | INTRAMUSCULAR | Status: DC
  Filled 2016-04-12: qty 10

## 2016-04-12 MED ORDER — VANCOMYCIN HCL 10 G IV SOLR
1500.0000 mg | INTRAVENOUS | Status: AC
  Administered 2016-04-13: 1500 mg via INTRAVENOUS
  Filled 2016-04-12: qty 1500

## 2016-04-12 MED ORDER — DOPAMINE-DEXTROSE 3.2-5 MG/ML-% IV SOLN
0.0000 ug/kg/min | INTRAVENOUS | Status: DC
  Filled 2016-04-12: qty 250

## 2016-04-12 MED ORDER — NITROGLYCERIN IN D5W 200-5 MCG/ML-% IV SOLN
2.0000 ug/min | INTRAVENOUS | Status: DC
  Filled 2016-04-12: qty 250

## 2016-04-12 MED ORDER — DEXTROSE 5 % IV SOLN
1.5000 g | INTRAVENOUS | Status: AC
  Administered 2016-04-13: 1.5 g via INTRAVENOUS
  Administered 2016-04-13: .75 g via INTRAVENOUS
  Filled 2016-04-12: qty 1.5

## 2016-04-12 MED ORDER — SODIUM CHLORIDE 0.9 % IV SOLN
INTRAVENOUS | Status: AC
  Administered 2016-04-13: 1 [IU]/h via INTRAVENOUS
  Administered 2016-04-13: .8 [IU]/h via INTRAVENOUS
  Filled 2016-04-12: qty 2.5

## 2016-04-12 MED ORDER — EPINEPHRINE PF 1 MG/ML IJ SOLN
0.0000 ug/min | INTRAMUSCULAR | Status: DC
  Filled 2016-04-12: qty 4

## 2016-04-12 MED ORDER — SODIUM CHLORIDE 0.9 % IV SOLN
INTRAVENOUS | Status: DC
  Filled 2016-04-12: qty 30

## 2016-04-12 MED ORDER — PLASMA-LYTE 148 IV SOLN
INTRAVENOUS | Status: DC
  Filled 2016-04-12: qty 2.5

## 2016-04-12 MED ORDER — METOPROLOL TARTRATE 12.5 MG HALF TABLET: 12.5000 mg | ORAL_TABLET | Freq: Once | ORAL | Status: DC

## 2016-04-12 MED ORDER — CHLORHEXIDINE GLUCONATE 0.12 % MT SOLN
15.0000 mL | Freq: Once | OROMUCOSAL | Status: AC
  Administered 2016-04-13: 15 mL via OROMUCOSAL
  Filled 2016-04-12: qty 15

## 2016-04-12 MED ORDER — DEXMEDETOMIDINE HCL IN NACL 400 MCG/100ML IV SOLN
0.1000 ug/kg/h | INTRAVENOUS | Status: AC
  Administered 2016-04-13: .3 ug/kg/h via INTRAVENOUS
  Filled 2016-04-12: qty 100

## 2016-04-12 MED ORDER — DEXTROSE 5 % IV SOLN
750.0000 mg | INTRAVENOUS | Status: DC
  Filled 2016-04-12: qty 750

## 2016-04-12 MED ORDER — TRANEXAMIC ACID (OHS) BOLUS VIA INFUSION
15.0000 mg/kg | INTRAVENOUS | Status: AC
  Administered 2016-04-13: 1350 mg via INTRAVENOUS
  Filled 2016-04-12: qty 1350

## 2016-04-12 MED ORDER — TRANEXAMIC ACID (OHS) PUMP PRIME SOLUTION
2.0000 mg/kg | INTRAVENOUS | Status: DC
  Filled 2016-04-12: qty 1.8

## 2016-04-12 MED ORDER — VANCOMYCIN HCL 1000 MG IV SOLR
INTRAVENOUS | Status: AC
  Administered 2016-04-13: 1000 mL
  Filled 2016-04-12: qty 1000

## 2016-04-12 MED ORDER — PHENYLEPHRINE HCL 10 MG/ML IJ SOLN
30.0000 ug/min | INTRAMUSCULAR | Status: AC
  Administered 2016-04-13: 15 ug/min via INTRAVENOUS
  Filled 2016-04-12: qty 2

## 2016-04-12 MED ORDER — POTASSIUM CHLORIDE 2 MEQ/ML IV SOLN
80.0000 meq | INTRAVENOUS | Status: DC
  Filled 2016-04-12: qty 40

## 2016-04-13 ENCOUNTER — Inpatient Hospital Stay (HOSPITAL_COMMUNITY): Payer: Medicare Other

## 2016-04-13 ENCOUNTER — Inpatient Hospital Stay (HOSPITAL_COMMUNITY)
Admission: RE | Admit: 2016-04-13 | Discharge: 2016-04-20 | DRG: 219 | Disposition: A | Discharge status: Skilled Nursing Facility | Payer: Medicare Other | Source: Ambulatory Visit | Attending: Thoracic Surgery (Cardiothoracic Vascular Surgery) | Admitting: Thoracic Surgery (Cardiothoracic Vascular Surgery)

## 2016-04-13 ENCOUNTER — Encounter (HOSPITAL_COMMUNITY)
Admission: RE | Disposition: A | Discharge status: Skilled Nursing Facility | Payer: Self-pay | Source: Ambulatory Visit | Attending: Thoracic Surgery (Cardiothoracic Vascular Surgery)

## 2016-04-13 ENCOUNTER — Encounter (HOSPITAL_COMMUNITY): Payer: Self-pay | Admitting: Anesthesiology

## 2016-04-13 ENCOUNTER — Inpatient Hospital Stay (HOSPITAL_COMMUNITY): Payer: Medicare Other | Admitting: Anesthesiology

## 2016-04-13 DIAGNOSIS — I4819 Other persistent atrial fibrillation: Secondary | ICD-10-CM | POA: Diagnosis present

## 2016-04-13 DIAGNOSIS — Z7901 Long term (current) use of anticoagulants: Secondary | ICD-10-CM

## 2016-04-13 DIAGNOSIS — I5043 Acute on chronic combined systolic (congestive) and diastolic (congestive) heart failure: Secondary | ICD-10-CM | POA: Diagnosis present

## 2016-04-13 DIAGNOSIS — K219 Gastro-esophageal reflux disease without esophagitis: Secondary | ICD-10-CM | POA: Diagnosis present

## 2016-04-13 DIAGNOSIS — I481 Persistent atrial fibrillation: Secondary | ICD-10-CM | POA: Diagnosis not present

## 2016-04-13 DIAGNOSIS — I493 Ventricular premature depolarization: Secondary | ICD-10-CM | POA: Diagnosis present

## 2016-04-13 DIAGNOSIS — Z9689 Presence of other specified functional implants: Secondary | ICD-10-CM

## 2016-04-13 DIAGNOSIS — I130 Hypertensive heart and chronic kidney disease with heart failure and stage 1 through stage 4 chronic kidney disease, or unspecified chronic kidney disease: Secondary | ICD-10-CM | POA: Diagnosis present

## 2016-04-13 DIAGNOSIS — Z79899 Other long term (current) drug therapy: Secondary | ICD-10-CM

## 2016-04-13 DIAGNOSIS — E876 Hypokalemia: Secondary | ICD-10-CM | POA: Diagnosis not present

## 2016-04-13 DIAGNOSIS — N189 Chronic kidney disease, unspecified: Secondary | ICD-10-CM | POA: Diagnosis present

## 2016-04-13 DIAGNOSIS — Z953 Presence of xenogenic heart valve: Secondary | ICD-10-CM

## 2016-04-13 DIAGNOSIS — I351 Nonrheumatic aortic (valve) insufficiency: Secondary | ICD-10-CM | POA: Diagnosis present

## 2016-04-13 DIAGNOSIS — D696 Thrombocytopenia, unspecified: Secondary | ICD-10-CM | POA: Diagnosis not present

## 2016-04-13 DIAGNOSIS — D62 Acute posthemorrhagic anemia: Secondary | ICD-10-CM | POA: Diagnosis not present

## 2016-04-13 DIAGNOSIS — E669 Obesity, unspecified: Secondary | ICD-10-CM | POA: Diagnosis present

## 2016-04-13 DIAGNOSIS — I340 Nonrheumatic mitral (valve) insufficiency: Secondary | ICD-10-CM | POA: Diagnosis present

## 2016-04-13 DIAGNOSIS — R262 Difficulty in walking, not elsewhere classified: Secondary | ICD-10-CM

## 2016-04-13 DIAGNOSIS — I4891 Unspecified atrial fibrillation: Secondary | ICD-10-CM

## 2016-04-13 DIAGNOSIS — I083 Combined rheumatic disorders of mitral, aortic and tricuspid valves: Secondary | ICD-10-CM | POA: Diagnosis not present

## 2016-04-13 DIAGNOSIS — I071 Rheumatic tricuspid insufficiency: Secondary | ICD-10-CM | POA: Diagnosis present

## 2016-04-13 DIAGNOSIS — H9193 Unspecified hearing loss, bilateral: Secondary | ICD-10-CM | POA: Diagnosis not present

## 2016-04-13 DIAGNOSIS — F419 Anxiety disorder, unspecified: Secondary | ICD-10-CM | POA: Diagnosis present

## 2016-04-13 DIAGNOSIS — Q211 Atrial septal defect: Secondary | ICD-10-CM

## 2016-04-13 DIAGNOSIS — I428 Other cardiomyopathies: Secondary | ICD-10-CM

## 2016-04-13 DIAGNOSIS — M797 Fibromyalgia: Secondary | ICD-10-CM | POA: Diagnosis present

## 2016-04-13 DIAGNOSIS — I420 Dilated cardiomyopathy: Secondary | ICD-10-CM | POA: Diagnosis not present

## 2016-04-13 DIAGNOSIS — J45909 Unspecified asthma, uncomplicated: Secondary | ICD-10-CM | POA: Diagnosis present

## 2016-04-13 DIAGNOSIS — Z9889 Other specified postprocedural states: Secondary | ICD-10-CM

## 2016-04-13 DIAGNOSIS — Z6832 Body mass index (BMI) 32.0-32.9, adult: Secondary | ICD-10-CM | POA: Diagnosis not present

## 2016-04-13 DIAGNOSIS — G8929 Other chronic pain: Secondary | ICD-10-CM | POA: Diagnosis present

## 2016-04-13 DIAGNOSIS — I361 Nonrheumatic tricuspid (valve) insufficiency: Secondary | ICD-10-CM | POA: Diagnosis not present

## 2016-04-13 DIAGNOSIS — Z8679 Personal history of other diseases of the circulatory system: Secondary | ICD-10-CM

## 2016-04-13 DIAGNOSIS — I5042 Chronic combined systolic (congestive) and diastolic (congestive) heart failure: Secondary | ICD-10-CM | POA: Diagnosis present

## 2016-04-13 DIAGNOSIS — E039 Hypothyroidism, unspecified: Secondary | ICD-10-CM | POA: Diagnosis present

## 2016-04-13 DIAGNOSIS — J9811 Atelectasis: Secondary | ICD-10-CM

## 2016-04-13 DIAGNOSIS — H9190 Unspecified hearing loss, unspecified ear: Secondary | ICD-10-CM

## 2016-04-13 HISTORY — DX: Other specified postprocedural states: Z98.890

## 2016-04-13 HISTORY — PX: TEE WITHOUT CARDIOVERSION: SHX5443

## 2016-04-13 HISTORY — PX: AORTIC VALVE REPLACEMENT: SHX41

## 2016-04-13 HISTORY — PX: MITRAL VALVE REPAIR: SHX2039

## 2016-04-13 HISTORY — DX: Presence of xenogenic heart valve: Z95.3

## 2016-04-13 HISTORY — PX: MAZE: SHX5063

## 2016-04-13 HISTORY — PX: TRICUSPID VALVE REPLACEMENT: SHX816

## 2016-04-13 HISTORY — PX: PATENT FORAMEN OVALE CLOSURE: SHX2181

## 2016-04-13 LAB — CREATININE, SERUM
Creatinine, Ser: 1 mg/dL (ref 0.44–1.00)
GFR calc Af Amer: >60 mL/min (ref >60)
GFR calc non Af Amer: 57 mL/min — ABNORMAL LOW (ref >60)

## 2016-04-13 LAB — POCT I-STAT, CHEM 8
BUN: 17 mg/dL (ref 6–20)
BUN: 15 mg/dL (ref 6–20)
BUN: 13 mg/dL (ref 6–20)
BUN: 17 mg/dL (ref 6–20)
BUN: 18 mg/dL (ref 6–20)
BUN: 15 mg/dL (ref 6–20)
BUN: 15 mg/dL (ref 6–20)
BUN: 11 mg/dL (ref 6–20)
CALCIUM ION: 1.04 mmol/L — AB (ref 1.15–1.40)
CHLORIDE: 99 mmol/L — AB (ref 101–111)
CHLORIDE: 102 mmol/L (ref 101–111)
CREATININE: 0.9 mg/dL (ref 0.44–1.00)
CREATININE: 0.6 mg/dL (ref 0.44–1.00)
CREATININE: 0.6 mg/dL (ref 0.44–1.00)
CREATININE: 0.7 mg/dL (ref 0.44–1.00)
CREATININE: 0.8 mg/dL (ref 0.44–1.00)
Calcium, Ion: 1.22 mmol/L (ref 1.15–1.40)
Calcium, Ion: 1.22 mmol/L (ref 1.15–1.40)
Calcium, Ion: 0.9 mmol/L — ABNORMAL LOW (ref 1.15–1.40)
Calcium, Ion: 1.05 mmol/L — ABNORMAL LOW (ref 1.15–1.40)
Calcium, Ion: 1 mmol/L — ABNORMAL LOW (ref 1.15–1.40)
Calcium, Ion: 1.05 mmol/L — ABNORMAL LOW (ref 1.15–1.40)
Calcium, Ion: 1.23 mmol/L (ref 1.15–1.40)
Chloride: 102 mmol/L (ref 101–111)
Chloride: 103 mmol/L (ref 101–111)
Chloride: 96 mmol/L — ABNORMAL LOW (ref 101–111)
Chloride: 105 mmol/L (ref 101–111)
Chloride: 103 mmol/L (ref 101–111)
Chloride: 110 mmol/L (ref 101–111)
Creatinine, Ser: 1 mg/dL (ref 0.44–1.00)
Creatinine, Ser: 0.7 mg/dL (ref 0.44–1.00)
Creatinine, Ser: 0.8 mg/dL (ref 0.44–1.00)
GLUCOSE: 93 mg/dL (ref 65–99)
GLUCOSE: 113 mg/dL — AB (ref 65–99)
GLUCOSE: 137 mg/dL — AB (ref 65–99)
GLUCOSE: 169 mg/dL — AB (ref 65–99)
GLUCOSE: 162 mg/dL — AB (ref 65–99)
Glucose, Bld: 98 mg/dL (ref 65–99)
Glucose, Bld: 110 mg/dL — ABNORMAL HIGH (ref 65–99)
Glucose, Bld: 136 mg/dL — ABNORMAL HIGH (ref 65–99)
HCT: 25 % — ABNORMAL LOW (ref 36.0–46.0)
HCT: 25 % — ABNORMAL LOW (ref 36.0–46.0)
HCT: 23 % — ABNORMAL LOW (ref 36.0–46.0)
HCT: 23 % — ABNORMAL LOW (ref 36.0–46.0)
HCT: 26 % — ABNORMAL LOW (ref 36.0–46.0)
HEMATOCRIT: 33 % — AB (ref 36.0–46.0)
HEMATOCRIT: 31 % — AB (ref 36.0–46.0)
HEMATOCRIT: 23 % — AB (ref 36.0–46.0)
HEMOGLOBIN: 11.2 g/dL — AB (ref 12.0–15.0)
HEMOGLOBIN: 8.5 g/dL — AB (ref 12.0–15.0)
HEMOGLOBIN: 8.8 g/dL — AB (ref 12.0–15.0)
HEMOGLOBIN: 7.8 g/dL — AB (ref 12.0–15.0)
Hemoglobin: 10.5 g/dL — ABNORMAL LOW (ref 12.0–15.0)
Hemoglobin: 8.5 g/dL — ABNORMAL LOW (ref 12.0–15.0)
Hemoglobin: 7.8 g/dL — ABNORMAL LOW (ref 12.0–15.0)
Hemoglobin: 7.8 g/dL — ABNORMAL LOW (ref 12.0–15.0)
POTASSIUM: 3.3 mmol/L — AB (ref 3.5–5.1)
POTASSIUM: 3.4 mmol/L — AB (ref 3.5–5.1)
POTASSIUM: 3.5 mmol/L (ref 3.5–5.1)
POTASSIUM: 3.9 mmol/L (ref 3.5–5.1)
POTASSIUM: 4.2 mmol/L (ref 3.5–5.1)
Potassium: 4.9 mmol/L (ref 3.5–5.1)
Potassium: 3.9 mmol/L (ref 3.5–5.1)
Potassium: 3.5 mmol/L (ref 3.5–5.1)
SODIUM: 139 mmol/L (ref 135–145)
Sodium: 138 mmol/L (ref 135–145)
Sodium: 138 mmol/L (ref 135–145)
Sodium: 139 mmol/L (ref 135–145)
Sodium: 130 mmol/L — ABNORMAL LOW (ref 135–145)
Sodium: 135 mmol/L (ref 135–145)
Sodium: 139 mmol/L (ref 135–145)
Sodium: 140 mmol/L (ref 135–145)
TCO2: 23 mmol/L (ref 0–100)
TCO2: 25 mmol/L (ref 0–100)
TCO2: 25 mmol/L (ref 0–100)
TCO2: 29 mmol/L (ref 0–100)
TCO2: 31 mmol/L (ref 0–100)
TCO2: 26 mmol/L (ref 0–100)
TCO2: 25 mmol/L (ref 0–100)
TCO2: 19 mmol/L (ref 0–100)

## 2016-04-13 LAB — POCT I-STAT 3, ART BLOOD GAS (G3+)
ACID-BASE DEFICIT: 1 mmol/L (ref 0.0–2.0)
Acid-base deficit: 3 mmol/L — ABNORMAL HIGH (ref 0.0–2.0)
Acid-base deficit: 3 mmol/L — ABNORMAL HIGH (ref 0.0–2.0)
BICARBONATE: 22.4 mmol/L (ref 20.0–28.0)
BICARBONATE: 23.1 mmol/L (ref 20.0–28.0)
Bicarbonate: 25.3 mmol/L (ref 20.0–28.0)
Bicarbonate: 23.1 mmol/L (ref 20.0–28.0)
O2 SAT: 96 %
O2 SAT: 96 %
O2 Saturation: 100 %
O2 Saturation: 100 %
PH ART: 7.373 (ref 7.350–7.450)
PH ART: 7.403 (ref 7.350–7.450)
PO2 ART: 170 mmHg — AB (ref 83.0–108.0)
TCO2: 24 mmol/L (ref 0–100)
TCO2: 24 mmol/L (ref 0–100)
TCO2: 27 mmol/L (ref 0–100)
TCO2: 24 mmol/L (ref 0–100)
pCO2 arterial: 38.5 mmHg (ref 32.0–48.0)
pCO2 arterial: 37.1 mmHg (ref 32.0–48.0)
pCO2 arterial: 42.2 mmHg (ref 32.0–48.0)
pCO2 arterial: 42.7 mmHg (ref 32.0–48.0)
pH, Arterial: 7.385 (ref 7.350–7.450)
pH, Arterial: 7.339 — ABNORMAL LOW (ref 7.350–7.450)
pO2, Arterial: 495 mmHg — ABNORMAL HIGH (ref 83.0–108.0)
pO2, Arterial: 83 mmHg (ref 83.0–108.0)
pO2, Arterial: 87 mmHg (ref 83.0–108.0)

## 2016-04-13 LAB — MAGNESIUM: Magnesium: 3 mg/dL — ABNORMAL HIGH (ref 1.7–2.4)

## 2016-04-13 LAB — GLUCOSE, CAPILLARY
GLUCOSE-CAPILLARY: 120 mg/dL — AB (ref 65–99)
GLUCOSE-CAPILLARY: 142 mg/dL — AB (ref 65–99)
GLUCOSE-CAPILLARY: 81 mg/dL (ref 65–99)
GLUCOSE-CAPILLARY: 92 mg/dL (ref 65–99)
Glucose-Capillary: 114 mg/dL — ABNORMAL HIGH (ref 65–99)
Glucose-Capillary: 63 mg/dL — ABNORMAL LOW (ref 65–99)

## 2016-04-13 LAB — CBC
HCT: 28.9 % — ABNORMAL LOW (ref 36.0–46.0)
HEMATOCRIT: 25.4 % — AB (ref 36.0–46.0)
Hemoglobin: 9.3 g/dL — ABNORMAL LOW (ref 12.0–15.0)
Hemoglobin: 8.1 g/dL — ABNORMAL LOW (ref 12.0–15.0)
MCH: 28.2 pg (ref 26.0–34.0)
MCH: 28.4 pg (ref 26.0–34.0)
MCHC: 32.2 g/dL (ref 30.0–36.0)
MCHC: 31.9 g/dL (ref 30.0–36.0)
MCV: 87.6 fL (ref 78.0–100.0)
MCV: 89.1 fL (ref 78.0–100.0)
PLATELETS: 133 10*3/uL — AB (ref 150–400)
Platelets: 108 10*3/uL — ABNORMAL LOW (ref 150–400)
RBC: 3.3 MIL/uL — AB (ref 3.87–5.11)
RBC: 2.85 MIL/uL — ABNORMAL LOW (ref 3.87–5.11)
RDW: 15.6 % — ABNORMAL HIGH (ref 11.5–15.5)
RDW: 16.1 % — AB (ref 11.5–15.5)
WBC: 12.8 10*3/uL — ABNORMAL HIGH (ref 4.0–10.5)
WBC: 9.3 10*3/uL (ref 4.0–10.5)

## 2016-04-13 LAB — PROTIME-INR
INR: 1.56
PROTHROMBIN TIME: 18.8 s — AB (ref 11.4–15.2)

## 2016-04-13 LAB — PLATELET COUNT: PLATELETS: 132 10*3/uL — AB (ref 150–400)

## 2016-04-13 LAB — PREPARE RBC (CROSSMATCH)

## 2016-04-13 LAB — APTT: aPTT: 35 seconds (ref 24–36)

## 2016-04-13 LAB — HEMOGLOBIN AND HEMATOCRIT, BLOOD
HEMATOCRIT: 24.4 % — AB (ref 36.0–46.0)
Hemoglobin: 7.9 g/dL — ABNORMAL LOW (ref 12.0–15.0)

## 2016-04-13 SURGERY — REPLACEMENT, AORTIC VALVE, OPEN
Anesthesia: General | Site: Chest

## 2016-04-13 MED ORDER — MIDAZOLAM HCL 2 MG/2ML IJ SOLN
2.0000 mg | INTRAMUSCULAR | Status: DC | PRN
  Administered 2016-04-13: 2 mg via INTRAVENOUS
  Filled 2016-04-13: qty 2

## 2016-04-13 MED ORDER — DEXTROSE 5 % IV SOLN
1.5000 g | Freq: Two times a day (BID) | INTRAVENOUS | Status: AC
  Administered 2016-04-13 – 2016-04-15 (×4): 1.5 g via INTRAVENOUS
  Filled 2016-04-13 (×4): qty 1.5

## 2016-04-13 MED ORDER — SODIUM CHLORIDE 0.9 % IV SOLN
INTRAVENOUS | Status: DC
  Administered 2016-04-13: 14:00:00 via INTRAVENOUS

## 2016-04-13 MED ORDER — ORAL CARE MOUTH RINSE
15.0000 mL | Freq: Four times a day (QID) | OROMUCOSAL | Status: DC
  Administered 2016-04-14 (×2): 15 mL via OROMUCOSAL

## 2016-04-13 MED ORDER — TRAMADOL HCL 50 MG PO TABS
50.0000 mg | ORAL_TABLET | ORAL | Status: DC | PRN
  Administered 2016-04-15 – 2016-04-16 (×3): 100 mg via ORAL
  Filled 2016-04-13 (×3): qty 2

## 2016-04-13 MED ORDER — SODIUM CHLORIDE 0.45 % IV SOLN
INTRAVENOUS | Status: DC | PRN
  Administered 2016-04-13: 16:00:00 via INTRAVENOUS

## 2016-04-13 MED ORDER — LACTATED RINGERS IV SOLN: INTRAVENOUS | Status: DC

## 2016-04-13 MED ORDER — ACETAMINOPHEN 650 MG RE SUPP
650.0000 mg | Freq: Once | RECTAL | Status: AC
  Administered 2016-04-13: 650 mg via RECTAL

## 2016-04-13 MED ORDER — ASPIRIN EC 325 MG PO TBEC
325.0000 mg | DELAYED_RELEASE_TABLET | Freq: Every day | ORAL | Status: DC
  Administered 2016-04-14 – 2016-04-16 (×3): 325 mg via ORAL
  Filled 2016-04-13 (×3): qty 1

## 2016-04-13 MED ORDER — LEVOTHYROXINE SODIUM 25 MCG PO TABS
125.0000 ug | ORAL_TABLET | Freq: Every day | ORAL | Status: DC
  Administered 2016-04-14 – 2016-04-20 (×7): 125 ug via ORAL
  Filled 2016-04-13 (×7): qty 1

## 2016-04-13 MED ORDER — CHLORHEXIDINE GLUCONATE 0.12% ORAL RINSE (MEDLINE KIT)
15.0000 mL | Freq: Two times a day (BID) | OROMUCOSAL | Status: DC
  Administered 2016-04-13: 15 mL via OROMUCOSAL

## 2016-04-13 MED ORDER — SODIUM CHLORIDE 0.9 % IR SOLN
Status: DC | PRN
  Administered 2016-04-13: 5000 mL
  Administered 2016-04-13 (×2): 1000 mL

## 2016-04-13 MED ORDER — INSULIN REGULAR BOLUS VIA INFUSION
0.0000 [IU] | Freq: Three times a day (TID) | INTRAVENOUS | Status: DC
  Filled 2016-04-13: qty 10

## 2016-04-13 MED ORDER — MIDAZOLAM HCL 5 MG/5ML IJ SOLN
INTRAMUSCULAR | Status: DC | PRN
  Administered 2016-04-13: 1 mg via INTRAVENOUS
  Administered 2016-04-13 (×2): 2 mg via INTRAVENOUS
  Administered 2016-04-13: 1 mg via INTRAVENOUS
  Administered 2016-04-13 (×2): 2 mg via INTRAVENOUS

## 2016-04-13 MED ORDER — PROPOFOL 10 MG/ML IV BOLUS
INTRAVENOUS | Status: AC
  Filled 2016-04-13: qty 20

## 2016-04-13 MED ORDER — VECURONIUM BROMIDE 10 MG IV SOLR
INTRAVENOUS | Status: DC | PRN
  Administered 2016-04-13: 2 mg via INTRAVENOUS
  Administered 2016-04-13: 4 mg via INTRAVENOUS
  Administered 2016-04-13 (×2): 2 mg via INTRAVENOUS

## 2016-04-13 MED ORDER — PROPOFOL 10 MG/ML IV BOLUS
INTRAVENOUS | Status: DC | PRN
  Administered 2016-04-13: 110 mg via INTRAVENOUS

## 2016-04-13 MED ORDER — DOCUSATE SODIUM 100 MG PO CAPS
200.0000 mg | ORAL_CAPSULE | Freq: Every day | ORAL | Status: DC
  Administered 2016-04-14 – 2016-04-20 (×8): 200 mg via ORAL
  Filled 2016-04-13 (×8): qty 2

## 2016-04-13 MED ORDER — ROCURONIUM BROMIDE 10 MG/ML (PF) SYRINGE
PREFILLED_SYRINGE | INTRAVENOUS | Status: DC | PRN
  Administered 2016-04-13 (×2): 50 mg via INTRAVENOUS

## 2016-04-13 MED ORDER — PANTOPRAZOLE SODIUM 40 MG PO TBEC
40.0000 mg | DELAYED_RELEASE_TABLET | Freq: Every day | ORAL | Status: DC
  Administered 2016-04-15 – 2016-04-20 (×6): 40 mg via ORAL
  Filled 2016-04-13 (×6): qty 1

## 2016-04-13 MED ORDER — ALBUMIN HUMAN 5 % IV SOLN
250.0000 mL | INTRAVENOUS | Status: AC | PRN
  Administered 2016-04-13 – 2016-04-14 (×4): 250 mL via INTRAVENOUS
  Filled 2016-04-13 (×2): qty 250

## 2016-04-13 MED ORDER — ALBUMIN HUMAN 5 % IV SOLN
12.5000 g | Freq: Once | INTRAVENOUS | Status: AC
  Administered 2016-04-13: 12.5 g via INTRAVENOUS
  Filled 2016-04-13: qty 250

## 2016-04-13 MED ORDER — BISACODYL 5 MG PO TBEC
10.0000 mg | DELAYED_RELEASE_TABLET | Freq: Every day | ORAL | Status: DC
  Administered 2016-04-14 – 2016-04-20 (×8): 10 mg via ORAL
  Filled 2016-04-13 (×8): qty 2

## 2016-04-13 MED ORDER — MIDAZOLAM HCL 10 MG/2ML IJ SOLN
INTRAMUSCULAR | Status: AC
  Filled 2016-04-13: qty 2

## 2016-04-13 MED ORDER — MORPHINE SULFATE (PF) 2 MG/ML IV SOLN
1.0000 mg | INTRAVENOUS | Status: DC | PRN
  Administered 2016-04-13 – 2016-04-14 (×2): 2 mg via INTRAVENOUS
  Filled 2016-04-13 (×2): qty 2

## 2016-04-13 MED ORDER — SODIUM CHLORIDE 0.9 % IV SOLN
30.0000 meq | Freq: Once | INTRAVENOUS | Status: AC
  Administered 2016-04-13: 30 meq via INTRAVENOUS
  Filled 2016-04-13: qty 15

## 2016-04-13 MED ORDER — LACTATED RINGERS IV SOLN: 500.0000 mL | Freq: Once | INTRAVENOUS | Status: DC | PRN

## 2016-04-13 MED ORDER — SODIUM CHLORIDE 0.9 % IJ SOLN
OROMUCOSAL | Status: DC | PRN
  Administered 2016-04-13: 8 mL via TOPICAL
  Administered 2016-04-13: 12 mL via TOPICAL

## 2016-04-13 MED ORDER — METOPROLOL TARTRATE 25 MG/10 ML ORAL SUSPENSION: 12.5000 mg | Freq: Two times a day (BID) | ORAL | Status: DC

## 2016-04-13 MED ORDER — INSULIN ASPART 100 UNIT/ML ~~LOC~~ SOLN
0.0000 [IU] | SUBCUTANEOUS | Status: DC
  Administered 2016-04-14 (×4): 2 [IU] via SUBCUTANEOUS

## 2016-04-13 MED ORDER — METOPROLOL TARTRATE 12.5 MG HALF TABLET: 12.5000 mg | ORAL_TABLET | Freq: Two times a day (BID) | ORAL | Status: DC

## 2016-04-13 MED ORDER — PROTAMINE SULFATE 10 MG/ML IV SOLN
INTRAVENOUS | Status: AC
  Filled 2016-04-13: qty 25

## 2016-04-13 MED ORDER — HEPARIN SODIUM (PORCINE) 1000 UNIT/ML IJ SOLN
INTRAMUSCULAR | Status: DC | PRN
  Administered 2016-04-13: 23000 [IU] via INTRAVENOUS

## 2016-04-13 MED ORDER — MORPHINE SULFATE (PF) 2 MG/ML IV SOLN
1.0000 mg | INTRAVENOUS | Status: DC | PRN
  Administered 2016-04-13 – 2016-04-16 (×5): 2 mg via INTRAVENOUS
  Filled 2016-04-13 (×4): qty 1

## 2016-04-13 MED ORDER — PROTAMINE SULFATE 10 MG/ML IV SOLN
INTRAVENOUS | Status: DC | PRN
  Administered 2016-04-13: 230 mg via INTRAVENOUS

## 2016-04-13 MED ORDER — CHLORHEXIDINE GLUCONATE 0.12 % MT SOLN
15.0000 mL | OROMUCOSAL | Status: AC
  Administered 2016-04-13: 15 mL via OROMUCOSAL

## 2016-04-13 MED ORDER — HEPARIN SODIUM (PORCINE) 1000 UNIT/ML IJ SOLN
INTRAMUSCULAR | Status: AC
  Filled 2016-04-13: qty 1

## 2016-04-13 MED ORDER — MILRINONE LACTATE IN DEXTROSE 20-5 MG/100ML-% IV SOLN
0.0000 ug/kg/min | INTRAVENOUS | Status: DC
  Administered 2016-04-13: 0.3 ug/kg/min via INTRAVENOUS
  Filled 2016-04-13 (×2): qty 100

## 2016-04-13 MED ORDER — MAGNESIUM SULFATE 4 GM/100ML IV SOLN
4.0000 g | Freq: Once | INTRAVENOUS | Status: AC
  Administered 2016-04-13: 4 g via INTRAVENOUS
  Filled 2016-04-13: qty 100

## 2016-04-13 MED ORDER — ALBUMIN HUMAN 5 % IV SOLN
INTRAVENOUS | Status: DC | PRN
  Administered 2016-04-13 (×2): via INTRAVENOUS

## 2016-04-13 MED ORDER — ROCURONIUM BROMIDE 50 MG/5ML IV SOSY
PREFILLED_SYRINGE | INTRAVENOUS | Status: AC
  Filled 2016-04-13: qty 5

## 2016-04-13 MED ORDER — ACETAMINOPHEN 500 MG PO TABS
1000.0000 mg | ORAL_TABLET | Freq: Four times a day (QID) | ORAL | Status: AC
  Administered 2016-04-14 – 2016-04-18 (×17): 1000 mg via ORAL
  Filled 2016-04-13 (×18): qty 2

## 2016-04-13 MED ORDER — DEXMEDETOMIDINE HCL IN NACL 200 MCG/50ML IV SOLN
0.0000 ug/kg/h | INTRAVENOUS | Status: DC
  Administered 2016-04-14: 0.2 ug/kg/h via INTRAVENOUS
  Filled 2016-04-13: qty 50

## 2016-04-13 MED ORDER — SODIUM CHLORIDE 0.9 % IJ SOLN
INTRAMUSCULAR | Status: AC
  Filled 2016-04-13: qty 10

## 2016-04-13 MED ORDER — DEXTROSE 50 % IV SOLN
1.0000 | Freq: Once | INTRAVENOUS | Status: AC
  Administered 2016-04-13: 50 mL via INTRAVENOUS
  Filled 2016-04-13: qty 50

## 2016-04-13 MED ORDER — SODIUM CHLORIDE 0.9 % IV SOLN
INTRAVENOUS | Status: DC
  Filled 2016-04-13: qty 2.5

## 2016-04-13 MED ORDER — SODIUM CHLORIDE 0.9 % IV SOLN
INTRAVENOUS | Status: DC
  Administered 2016-04-13: 17:00:00 via INTRAVENOUS

## 2016-04-13 MED ORDER — ASPIRIN 81 MG PO CHEW: 324.0000 mg | CHEWABLE_TABLET | Freq: Every day | ORAL | Status: DC

## 2016-04-13 MED ORDER — CHLORHEXIDINE GLUCONATE 4 % EX LIQD: 30.0000 mL | CUTANEOUS | Status: DC

## 2016-04-13 MED ORDER — SODIUM CHLORIDE 0.9 % IV SOLN: 250.0000 mL | INTRAVENOUS | Status: DC

## 2016-04-13 MED ORDER — MILRINONE LACTATE IN DEXTROSE 20-5 MG/100ML-% IV SOLN
0.3750 ug/kg/min | INTRAVENOUS | Status: DC
  Administered 2016-04-13: .2 ug/kg/min via INTRAVENOUS
  Filled 2016-04-13: qty 100

## 2016-04-13 MED ORDER — FENTANYL CITRATE (PF) 250 MCG/5ML IJ SOLN
INTRAMUSCULAR | Status: DC | PRN
  Administered 2016-04-13 (×5): 100 ug via INTRAVENOUS
  Administered 2016-04-13 (×2): 150 ug via INTRAVENOUS
  Administered 2016-04-13 (×2): 50 ug via INTRAVENOUS
  Administered 2016-04-13: 100 ug via INTRAVENOUS

## 2016-04-13 MED ORDER — LACTATED RINGERS IV SOLN
INTRAVENOUS | Status: DC | PRN
  Administered 2016-04-13 (×3): via INTRAVENOUS

## 2016-04-13 MED ORDER — OXYCODONE HCL 5 MG PO TABS
5.0000 mg | ORAL_TABLET | ORAL | Status: DC | PRN
  Administered 2016-04-14 – 2016-04-15 (×6): 10 mg via ORAL
  Administered 2016-04-17 – 2016-04-18 (×2): 5 mg via ORAL
  Administered 2016-04-18 (×2): 10 mg via ORAL
  Administered 2016-04-18: 5 mg via ORAL
  Administered 2016-04-18 – 2016-04-20 (×10): 10 mg via ORAL
  Filled 2016-04-13: qty 1
  Filled 2016-04-13 (×2): qty 2
  Filled 2016-04-13: qty 1
  Filled 2016-04-13 (×6): qty 2
  Filled 2016-04-13: qty 1
  Filled 2016-04-13 (×10): qty 2

## 2016-04-13 MED ORDER — CALCIUM CHLORIDE 10 % IV SOLN
1.0000 g | Freq: Once | INTRAVENOUS | Status: AC
  Administered 2016-04-13: 1 g via INTRAVENOUS

## 2016-04-13 MED ORDER — SODIUM CHLORIDE 0.9% FLUSH
3.0000 mL | Freq: Two times a day (BID) | INTRAVENOUS | Status: DC
  Administered 2016-04-14 – 2016-04-19 (×6): 3 mL via INTRAVENOUS

## 2016-04-13 MED ORDER — DEXMEDETOMIDINE HCL IN NACL 200 MCG/50ML IV SOLN
INTRAVENOUS | Status: AC
  Filled 2016-04-13: qty 50

## 2016-04-13 MED ORDER — NITROGLYCERIN IN D5W 200-5 MCG/ML-% IV SOLN: 0.0000 ug/min | INTRAVENOUS | Status: DC

## 2016-04-13 MED ORDER — SODIUM CHLORIDE 0.9 % IV SOLN
1.5000 mg/kg/h | INTRAVENOUS | Status: DC
  Filled 2016-04-13: qty 25

## 2016-04-13 MED ORDER — ONDANSETRON HCL 4 MG/2ML IJ SOLN
4.0000 mg | Freq: Four times a day (QID) | INTRAMUSCULAR | Status: DC | PRN
  Administered 2016-04-15 (×2): 4 mg via INTRAVENOUS
  Filled 2016-04-13 (×2): qty 2

## 2016-04-13 MED ORDER — SODIUM CHLORIDE 0.9% FLUSH: 3.0000 mL | INTRAVENOUS | Status: DC | PRN

## 2016-04-13 MED ORDER — ACETAMINOPHEN 160 MG/5ML PO SOLN
1000.0000 mg | Freq: Four times a day (QID) | ORAL | Status: DC
  Administered 2016-04-14: 1000 mg
  Filled 2016-04-13: qty 40.6

## 2016-04-13 MED ORDER — METOPROLOL TARTRATE 5 MG/5ML IV SOLN: 2.5000 mg | INTRAVENOUS | Status: DC | PRN

## 2016-04-13 MED ORDER — BISACODYL 10 MG RE SUPP: 10.0000 mg | Freq: Every day | RECTAL | Status: DC

## 2016-04-13 MED ORDER — VANCOMYCIN HCL IN DEXTROSE 1-5 GM/200ML-% IV SOLN
1000.0000 mg | Freq: Once | INTRAVENOUS | Status: AC
  Administered 2016-04-13: 1000 mg via INTRAVENOUS
  Filled 2016-04-13: qty 200

## 2016-04-13 MED ORDER — PHENYLEPHRINE HCL 10 MG/ML IJ SOLN
0.0000 ug/min | INTRAVENOUS | Status: DC
  Filled 2016-04-13 (×4): qty 2

## 2016-04-13 MED ORDER — VECURONIUM BROMIDE 10 MG IV SOLR
INTRAVENOUS | Status: AC
  Filled 2016-04-13: qty 10

## 2016-04-13 MED ORDER — ACETAMINOPHEN 160 MG/5ML PO SOLN: 650.0000 mg | Freq: Once | ORAL | Status: AC

## 2016-04-13 MED ORDER — FAMOTIDINE IN NACL 20-0.9 MG/50ML-% IV SOLN
20.0000 mg | Freq: Two times a day (BID) | INTRAVENOUS | Status: DC
  Administered 2016-04-13: 20 mg via INTRAVENOUS

## 2016-04-13 MED ORDER — FENTANYL CITRATE (PF) 250 MCG/5ML IJ SOLN
INTRAMUSCULAR | Status: AC
  Filled 2016-04-13: qty 25

## 2016-04-13 MED FILL — Magnesium Sulfate Inj 50%: INTRAMUSCULAR | Qty: 10 | Status: AC

## 2016-04-13 MED FILL — Heparin Sodium (Porcine) Inj 1000 Unit/ML: INTRAMUSCULAR | Qty: 30 | Status: AC

## 2016-04-13 MED FILL — Potassium Chloride Inj 2 mEq/ML: INTRAVENOUS | Qty: 40 | Status: AC

## 2016-04-13 SURGICAL SUPPLY — 139 items
ADAPTER CARDIO PERF ANTE/RETRO (ADAPTER) ×5 IMPLANT
ADPR PRFSN 84XANTGRD RTRGD (ADAPTER) ×2
APPLICATOR COTTON TIP 6IN STRL (MISCELLANEOUS) IMPLANT
ARTICLIP LAA PROCLIP II 45 (Clip) ×3 IMPLANT
ATTRACTOMAT 16X20 MAGNETIC DRP (DRAPES) ×5 IMPLANT
BAG DECANTER FOR FLEXI CONT (MISCELLANEOUS) ×5 IMPLANT
BLADE STERNUM SYSTEM 6 (BLADE) ×5 IMPLANT
BLADE SURG 11 STRL SS (BLADE) ×5 IMPLANT
CANISTER SUCTION 2500CC (MISCELLANEOUS) ×5 IMPLANT
CANN PRFSN 3/8X14X24FR PCFC (MISCELLANEOUS)
CANN PRFSN 3/8XCNCT ST RT ANG (MISCELLANEOUS)
CANNULA EZ GLIDE AORTIC 21FR (CANNULA) ×5 IMPLANT
CANNULA FEM VENOUS REMOTE 22FR (CANNULA) ×1 IMPLANT
CANNULA GUNDRY RCSP 15FR (MISCELLANEOUS) ×5 IMPLANT
CANNULA PRFSN 3/8X14X24FR PCFC (MISCELLANEOUS) IMPLANT
CANNULA PRFSN 3/8XCNCT RT ANG (MISCELLANEOUS) IMPLANT
CANNULA SOFTFLOW AORTIC 7M21FR (CANNULA) ×3 IMPLANT
CANNULA SUMP PERICARDIAL (CANNULA) ×1 IMPLANT
CANNULA VEN MTL TIP RT (MISCELLANEOUS)
CANNULA VENNOUS METAL TIP 20FR (CANNULA) ×1 IMPLANT
CARDIOBLATE CARDIAC ABLATION (MISCELLANEOUS)
CATH CPB KIT OWEN (MISCELLANEOUS) ×3 IMPLANT
CATH FOLEY 2WAY SLVR  5CC 14FR (CATHETERS)
CATH FOLEY 2WAY SLVR 5CC 14FR (CATHETERS) IMPLANT
CATH HEART VENT LEFT (CATHETERS) ×2 IMPLANT
CATH ROBINSON RED A/P 18FR (CATHETERS) ×1 IMPLANT
CATH THORACIC 28FR RT ANG (CATHETERS) IMPLANT
CATH THORACIC 36FR (CATHETERS) ×1 IMPLANT
CATH THORACIC 36FR RT ANG (CATHETERS) ×2 IMPLANT
CLAMP ISOLATOR SYNERGY LG (MISCELLANEOUS) ×2 IMPLANT
CLIP FOGARTY SPRING 6M (CLIP) IMPLANT
CONN 1/2X1/2X1/2  BEN (MISCELLANEOUS) ×1
CONN 1/2X1/2X1/2 BEN (MISCELLANEOUS) ×4 IMPLANT
CONN 3/8X1/2 ST GISH (MISCELLANEOUS) ×13 IMPLANT
CONN ST 1/4X3/8  BEN (MISCELLANEOUS) ×2
CONN ST 1/4X3/8 BEN (MISCELLANEOUS) IMPLANT
CONT SPEC 4OZ CLIKSEAL STRL BL (MISCELLANEOUS) ×1 IMPLANT
COUNTER NEEDLE 20 DBL MAG RED (NEEDLE) ×1 IMPLANT
COVER PROBE W GEL 5X96 (DRAPES) ×1 IMPLANT
COVER SURGICAL LIGHT HANDLE (MISCELLANEOUS) ×10 IMPLANT
CRADLE DONUT ADULT HEAD (MISCELLANEOUS) ×5 IMPLANT
DEVICE ATRICLIP LAA PRCLPII 45 (Clip) IMPLANT
DEVICE CARDIOBLATE CARDIAC ABL (MISCELLANEOUS) IMPLANT
DEVICE SUT CK QUICK LOAD MINI (Prosthesis & Implant Heart) ×54 IMPLANT
DRAIN CHANNEL 28F RND 3/8 FF (WOUND CARE) ×1 IMPLANT
DRAIN CHANNEL 32F RND 10.7 FF (WOUND CARE) ×7 IMPLANT
DRAPE BILATERAL SPLIT (DRAPES) IMPLANT
DRAPE CARDIOVASCULAR INCISE (DRAPES) ×3
DRAPE CV SPLIT W-CLR ANES SCRN (DRAPES) IMPLANT
DRAPE INCISE IOBAN 66X45 STRL (DRAPES) ×7 IMPLANT
DRAPE SLUSH/WARMER DISC (DRAPES) ×5 IMPLANT
DRAPE SRG 135X102X78XABS (DRAPES) ×2 IMPLANT
DRSG AQUACEL AG ADV 3.5X14 (GAUZE/BANDAGES/DRESSINGS) ×1 IMPLANT
DRSG COVADERM 4X14 (GAUZE/BANDAGES/DRESSINGS) ×5 IMPLANT
ELECT REM PT RETURN 9FT ADLT (ELECTROSURGICAL) ×6
ELECTRODE REM PT RTRN 9FT ADLT (ELECTROSURGICAL) ×8 IMPLANT
FELT TEFLON 1X6 (MISCELLANEOUS) ×9 IMPLANT
GAUZE SPONGE 4X4 12PLY STRL (GAUZE/BANDAGES/DRESSINGS) ×10 IMPLANT
GAUZE SPONGE 4X4 16PLY XRAY LF (GAUZE/BANDAGES/DRESSINGS) ×1 IMPLANT
GLOVE BIO SURGEON STRL SZ 6 (GLOVE) ×2 IMPLANT
GLOVE BIO SURGEON STRL SZ 6.5 (GLOVE) ×2 IMPLANT
GLOVE BIO SURGEON STRL SZ7 (GLOVE) ×2 IMPLANT
GLOVE BIO SURGEON STRL SZ7.5 (GLOVE) ×4 IMPLANT
GLOVE BIOGEL PI IND STRL 6.5 (GLOVE) IMPLANT
GLOVE BIOGEL PI INDICATOR 6.5 (GLOVE) ×4
GLOVE INDICATOR 7.0 STRL GRN (GLOVE) ×4 IMPLANT
GLOVE ORTHO TXT STRL SZ7.5 (GLOVE) ×9 IMPLANT
GOWN STRL REUS W/ TWL LRG LVL3 (GOWN DISPOSABLE) ×16 IMPLANT
GOWN STRL REUS W/TWL LRG LVL3 (GOWN DISPOSABLE) ×24
HEMOSTAT POWDER SURGIFOAM 1G (HEMOSTASIS) ×15 IMPLANT
INSERT FOGARTY XLG (MISCELLANEOUS) ×5 IMPLANT
KIT BASIN OR (CUSTOM PROCEDURE TRAY) ×5 IMPLANT
KIT DILATOR VASC 18G NDL (KITS) ×1 IMPLANT
KIT DRAINAGE VACCUM ASSIST (KITS) ×1 IMPLANT
KIT ROOM TURNOVER OR (KITS) ×5 IMPLANT
KIT SUCTION CATH 14FR (SUCTIONS) ×17 IMPLANT
KIT SUT CK MINI COMBO 4X17 (Prosthesis & Implant Heart) ×1 IMPLANT
LEAD PACING MYOCARDI (MISCELLANEOUS) ×3 IMPLANT
LINE VENT (MISCELLANEOUS) ×1 IMPLANT
LOOP VESSEL SUPERMAXI WHITE (MISCELLANEOUS) ×3 IMPLANT
MARKER GRAFT CORONARY BYPASS (MISCELLANEOUS) IMPLANT
NS IRRIG 1000ML POUR BTL (IV SOLUTION) ×25 IMPLANT
PACK OPEN HEART (CUSTOM PROCEDURE TRAY) ×5 IMPLANT
PAD ARMBOARD 7.5X6 YLW CONV (MISCELLANEOUS) ×12 IMPLANT
PROBE CRYO2-ABLATION MALLABLE (MISCELLANEOUS) ×1 IMPLANT
RING MITRAL MEMO 3D 26MM SMD26 (Prosthesis & Implant Heart) ×1 IMPLANT
RING TRICUSPID T26 (Prosthesis & Implant Heart) ×1 IMPLANT
SENSOR MYOCARDIAL TEMP (MISCELLANEOUS) ×2 IMPLANT
SET CARDIOPLEGIA MPS 5001102 (MISCELLANEOUS) ×1 IMPLANT
SET IRRIG TUBING LAPAROSCOPIC (IRRIGATION / IRRIGATOR) ×5 IMPLANT
SOLUTION ANTI FOG 6CC (MISCELLANEOUS) ×1 IMPLANT
SUCKER INTRACARDIAC WEIGHTED (SUCKER) ×5 IMPLANT
SUT BONE WAX W31G (SUTURE) ×5 IMPLANT
SUT ETHIBON 2 0 V 52N 30 (SUTURE) ×8 IMPLANT
SUT ETHIBON EXCEL 2-0 V-5 (SUTURE) IMPLANT
SUT ETHIBOND (SUTURE) ×3 IMPLANT
SUT ETHIBOND 2 0 SH (SUTURE) ×18 IMPLANT
SUT ETHIBOND 2 0 SH 36X2 (SUTURE) ×8 IMPLANT
SUT ETHIBOND 2 0 V4 (SUTURE) IMPLANT
SUT ETHIBOND 2 0V4 GREEN (SUTURE) ×1 IMPLANT
SUT ETHIBOND 2-0 RB-1 WHT (SUTURE) ×3 IMPLANT
SUT ETHIBOND 4 0 RB 1 (SUTURE) IMPLANT
SUT ETHIBOND 4 0 TF (SUTURE) IMPLANT
SUT ETHIBOND 5 0 C 1 30 (SUTURE) ×6 IMPLANT
SUT ETHIBOND V-5 VALVE (SUTURE) IMPLANT
SUT ETHIBOND X763 2 0 SH 1 (SUTURE) ×17 IMPLANT
SUT MNCRL AB 3-0 PS2 18 (SUTURE) ×12 IMPLANT
SUT PDS AB 1 CTX 36 (SUTURE) ×12 IMPLANT
SUT PROLENE 3 0 SH 1 (SUTURE) ×6 IMPLANT
SUT PROLENE 3 0 SH DA (SUTURE) ×5 IMPLANT
SUT PROLENE 3 0 SH1 36 (SUTURE) ×1 IMPLANT
SUT PROLENE 4 0 RB 1 (SUTURE) ×21
SUT PROLENE 4 0 SH DA (SUTURE) ×10 IMPLANT
SUT PROLENE 4-0 RB1 .5 CRCL 36 (SUTURE) ×8 IMPLANT
SUT PROLENE 5 0 C 1 36 (SUTURE) IMPLANT
SUT PROLENE 6 0 C 1 30 (SUTURE) ×2 IMPLANT
SUT SILK  1 MH (SUTURE) ×4
SUT SILK 1 MH (SUTURE) ×4 IMPLANT
SUT SILK 2 0 SH CR/8 (SUTURE) IMPLANT
SUT SILK 3 0 SH CR/8 (SUTURE) IMPLANT
SUT STEEL 6MS V (SUTURE) IMPLANT
SUT STEEL STERNAL CCS#1 18IN (SUTURE) ×1 IMPLANT
SUT STEEL SZ 6 DBL 3X14 BALL (SUTURE) ×2 IMPLANT
SUT VIC AB 1 CTX 36 (SUTURE) ×3
SUT VIC AB 1 CTX36XBRD ANBCTR (SUTURE) IMPLANT
SUT VIC AB 2-0 CTX 27 (SUTURE) IMPLANT
SUTURE E-PAK OPEN HEART (SUTURE) ×3 IMPLANT
SYSTEM SAHARA CHEST DRAIN ATS (WOUND CARE) ×5 IMPLANT
TAPE CLOTH SURG 4X10 WHT LF (GAUZE/BANDAGES/DRESSINGS) ×1 IMPLANT
TAPE PAPER 2X10 WHT MICROPORE (GAUZE/BANDAGES/DRESSINGS) ×1 IMPLANT
TOWEL OR 17X24 6PK STRL BLUE (TOWEL DISPOSABLE) ×12 IMPLANT
TOWEL OR 17X26 10 PK STRL BLUE (TOWEL DISPOSABLE) ×10 IMPLANT
TRAY FOLEY IC TEMP SENS 14FR (CATHETERS) ×2 IMPLANT
TRAY FOLEY IC TEMP SENS 16FR (CATHETERS) ×5 IMPLANT
TUBING INSUFFLATION 10FT LAP (TUBING) ×5 IMPLANT
UNDERPAD 30X30 (UNDERPADS AND DIAPERS) ×5 IMPLANT
VALVE MAGNA EASE AORTIC 23MM (Prosthesis & Implant Heart) ×1 IMPLANT
VENT LEFT HEART 12002 (CATHETERS) ×3
WATER STERILE IRR 1000ML POUR (IV SOLUTION) ×10 IMPLANT

## 2016-04-13 NOTE — Brief Op Note (Addendum)
04/13/2016  12:33 PM  PATIENT:  Allison [name]  65 y.o. female  PRE-OPERATIVE DIAGNOSIS:   AI MR TR AFIB  POST-OPERATIVE DIAGNOSIS:   AI MR TR AFIB  PROCEDURE:  Procedure(s): AORTIC VALVE REPLACEMENT (AVR) implanted with Magna Ease size 50mm (N/A) MAZE (N/A) TRANSESOPHAGEAL ECHOCARDIOGRAM (TEE) (N/A) PATENT FORAMEN OVALE (PFO) CLOSURE (N/A) MITRAL VALVE REPAIR (MVR) using 74mm Sorin /MEMO 3D annuloplasty ring (N/A)  TRICUSPID VALVE REPAIR (TVR) using 25mm Edwards mce annuloplasty ring  SURGEON:    Purcell Nails, MD  ASSISTANTS:  Rowe Clack, PA-C  ANESTHESIA:   Achille Rich, MD  CROSSCLAMP TIME:   147'  CARDIOPULMONARY BYPASS TIME: 231'  FINDINGS:  Dilated LV with mild global LV systolic dysfunction  Type I mitral valve dysfunction with severe mitral regurgitation  No residual mitral regurgitation after ring annuloplasty  Trileaflet aortic valve with moderate aortic insufficiency  Type I tricuspid valve dysfunction with severe tricuspid regurgitation  Mild residual tricuspid regurgitation after ring annuloplasty  Maze Procedure  Surgical Approach: Median sternotomy  Cut-and-sew:  No.  Cryo: Yes  Cryo Lesions (select all that apply):       6  Mitral Valve Cryo Lesion,     10  Tricuspid Cryo Lesion, 16  Other - epicardial posterior AV groove and coronary sinus  Radiofrequency:  Yes.  Bipolar: Yes.  RF Lesions (select all that apply):      1   Pulmonary Vein Isolation,    2   Box Lesion,   3a  Inferior Pulmonary Vein Connecting Lesion,   3b  Superior Pulmonary Vein Connecting Lesion,     4  Posterior Mirtal Annular Line,    11  Intercaval Line,   15a  RAA Lateral Wall (Short) and   15b  RAA Lateral Wall to "T" Lesion    Left Atrial Appendage Treatment:    Yes -  epicardial clip    COMPLICATIONS: None  BASELINE WEIGHT: 90 kg  PATIENT DISPOSITION:   TO SICU IN STABLE CONDITION  Purcell Nails, MD 04/13/2016 2:43 PM

## 2016-04-13 NOTE — Progress Notes (Signed)
  Echocardiogram Echocardiogram Transesophageal has been performed.  Allison [name] M 04/13/2016, 8:43 AM 

## 2016-04-13 NOTE — Transfer of Care (Signed)
Immediate Anesthesia Transfer of Care Note  Patient: Allison [name]  Procedure(s) Performed: Procedure(s): AORTIC VALVE REPLACEMENT (AVR) implanted with Magna Ease size 33mm (N/A) MAZE (N/A) TRANSESOPHAGEAL ECHOCARDIOGRAM (TEE) (N/A) PATENT FORAMEN OVALE (PFO) CLOSURE (N/A) MITRAL VALVE REPAIR (MVR) using 93mm Sorin /MEMO 3D annuloplasty ring (N/A) TRICUSPID VALVE REPAIR using a T26 MC3 Edwards annuloplasty ring (N/A)  Patient Location: SICU  Anesthesia Type:General  Level of Consciousness: sedated and Patient remains intubated per anesthesia plan  Airway & Oxygen Therapy: Patient remains intubated per anesthesia plan and Patient placed on Ventilator (see vital sign flow sheet for setting)  Post-op Assessment: Report given to RN and Post -op Vital signs reviewed and stable  Post vital signs: Reviewed and stable  Last Vitals:  Vitals:   04/13/16 0629  BP: 123/80  Pulse: (!) 104  Resp: 20  Temp: 36.7 C    Last Pain:  Vitals:   04/13/16 0629  TempSrc: Oral         Complications: No apparent anesthesia complications

## 2016-04-13 NOTE — Interval H&P Note (Signed)
History and Physical Interval Note:  04/13/2016 6:36 AM  Lezlie Lye  has presented today for surgery, with the diagnosis of AI MR TR AFIB  The various methods of treatment have been discussed with the patient and family. After consideration of risks, benefits and other options for treatment, the patient has consented to  Procedure(s): AORTIC VALVE REPLACEMENT (AVR) (N/A) MITRAL VALVE REPAIR (MVR) (N/A) TRICUSPID VALVE REPAIR (N/A) MAZE (N/A) TRANSESOPHAGEAL ECHOCARDIOGRAM (TEE) (N/A) as a surgical intervention .  The patient's history has been reviewed, patient examined, no change in status, stable for surgery.  I have reviewed the patient's chart and labs.  Questions were answered to the patient's satisfaction.     Purcell Nails

## 2016-04-13 NOTE — Anesthesia Procedure Notes (Signed)
Central Venous Catheter Insertion Performed by: anesthesiologist 04/13/2016 8:05 AM Patient location: OR. Preanesthetic checklist: patient identified, IV checked, site marked, risks and benefits discussed, surgical consent, monitors and equipment checked, pre-op evaluation, timeout performed and anesthesia consent Position: Trendelenburg Landmarks identified and Seldinger technique used Catheter size: 8.5 Fr Central line was placed.Sheath introducer Swan type and PA catheter depth:thermodilationProcedure performed using ultrasound guided technique. Attempts: 2 (Failed attempt on right IJ.  Could not pass wire) Following insertion, line sutured, dressing applied and Biopatch. Post procedure assessment: blood return through all ports, free fluid flow and no air. Patient tolerated the procedure well with no immediate complications.

## 2016-04-13 NOTE — Op Note (Signed)
CARDIOTHORACIC SURGERY OPERATIVE NOTE  Date of Procedure:  04/13/2016  Preoperative Diagnosis:   Severe Mitral Regurgitation  Moderate Aortic Insufficiency  Moderate Tricuspid Regurgitation  Long-standing Persistent Atrial Fibrillation  Patent Foramen Ovale  Postoperative Diagnosis:   Severe Mitral Regurgitation  Moderate Aortic Insufficiency  Moderate Tricuspid Regurgitation  Long-standing Persistent Atrial Fibrillation  Patent Foramen Ovale  Procedure:    Mitral Valve Repair  Sorin Memo 3D ring annuloplasty (size 70mm, catalog #[id number], serial [id number])   Aortic Valve Replacement  Edwards Magna Ease Pericardial Tissue Valve (size 23 mm, model # 3300TFX, serial # [id number])   Tricuspid Valve Repair  Sorin Memo 3D ring annuloplasty (size 28mm, model #4900, serial #[id number])   Closure of Patent Foramen Ovale    Maze Procedure  Complete bilateral atrial lesion set using cryothermy and bipolar radiofrequency ablation  Clipping of left atrial appendage (Atricure Pro245 clip, size 45 mm)   Surgeon: Salvatore Decent. Cornelius Moras, MD  Assistant: Rowe Clack, PA-C  Anesthesia: Achille Rich, MD  Operative Findings:  Dilated LV with mild global LV systolic dysfunction  Type I mitral valve dysfunction with severe mitral regurgitation  No residual mitral regurgitation after ring annuloplasty  Trileaflet aortic valve with moderate aortic insufficiency  Type I tricuspid valve dysfunction with severe tricuspid regurgitation  Mild residual tricuspid regurgitation after ring annuloplasty                   BRIEF CLINICAL NOTE AND INDICATIONS FOR SURGERY  Patient is a 65 year old moderately obese female with history of hypertension, atrial fibrillation, nonischemic cardiomyopathy, chronic combined systolic and diastolic congestive heart failure, mitral regurgitation, atrial fibrillation, and bilateral deafness who has been referred for surgical consultation to  discuss treatment options for management of severe mitral regurgitation and persistent atrial fibrillation. The patient's cardiac history dates back within 5 years ago when she first presented with paroxysmal atrial fibrillation that occurred in the setting of hyperthyroidism. She was found to have a thyroid nodule and underwent partial thyroidectomy. She has been followed intermittently ever since by Dr. Eldridge Dace. In June 2015 the patient presented with shortness of breath. CT scan performed to rule out pulmonary embolus demonstrated pulmonary edema. Echocardiogram performed at that time revealed normal left ventricular systolic function with ejection fraction estimated 55-60%. There was mild aortic insufficiency and trivial mitral regurgitation. There was mild left atrial enlargement. She was started on diuretics with some symptomatic improvement. She returnedfor follow-up in May 2016 and was found to be in atrial fibrillation. She was started on Eliquis and underwent successful cardioversion one month later but quickly went back into atrial fibrillation. She was referred [facility] andstarted on Flecainide but developed abdominal pain. She was switched to Propafenone and underwent successful cardioversion, but Propafenone was later stopped due to side effects. She was not felt to be a good candidate for Multaq. She return to see Dr. Angus Seller follow-up in September 2017 and complained of worsening exertional shortness of breath, orthopnea, palpitations, and heart racing. She was noted to be back in atrial fibrillation with rapid ventricular response. She underwent follow-up transthoracic echocardiogram on 02/01/2016 that revealed significant drop in left ventricular systolic function with ejection fraction estimated at only 20-25%. There was severe diffuse hypokinesis with systolic dysfunction could any. There was moderate to severe mitral regurgitation and mild to moderate  aortic insufficiency. There was mild to moderate tricuspid regurgitation. The patient subsequently underwent TEE and diagnostic cardiac catheterization by Dr. Shirlee Latch on 02/18/2016. TEE confirmed the presence of  significant left ventricular systolic dysfunction with ejection fraction estimated at only 40% in the setting of severe mitral regurgitation. There appeared to be normal leaflet mobility the mitral valve but a broad central jet of severe mitral regurgitation. There was flow reversal in the pulmonary veins. There was moderate to severe left atrial enlargement. There was moderate aortic insufficiency. The aortic valve was trileaflet. There appeared to be a small patent foramen ovale but bubble study was negative. There was mild to moderate tricuspid regurgitation. Cardiac catheterization was notable for the absence of significant coronary artery disease. The patient had elevated pulmonary capillary wedge pressure with prominent V waves but pulmonary artery pressures were only slightly elevated. The patient was referred for surgical consultation.  She was originally seen in consultation on 03/01/2016. Since then she has undergone cardiac MRI and CT angiography. Cardiac MRI revealed no evidence for myocarditis, infiltrative disease, or previous myocardial infarction. She was noted to have at least moderate aortic insufficiency. There was severe mitral regurgitation and moderate tricuspid regurgitation. Ejection fraction was measured 45%. There was normal right ventricular size and function.  The patient has been seen in consultation and counseled at length regarding the indications, risks and potential benefits of surgery.  All questions have been answered, and the patient provides full informed consent for the operation as described.    DETAILS OF THE OPERATIVE PROCEDURE  Preparation:  The patient is brought to the operating room on the above mentioned date and central monitoring was established by  the anesthesia team including placement of Swan-Ganz catheter and radial arterial line. The patient is placed in the supine position on the operating table.  Intravenous antibiotics are administered. General endotracheal anesthesia is induced uneventfully. A Foley catheter is placed.  Baseline transesophageal echocardiogram was performed.  Findings were notable for moderately dilated left ventricle with mild global left ventricular systolic dysfunction. Ejection fraction was estimated 45-50%.  There was severe mitral regurgitation.  There was mild sclerosis of the mitral valve leaflets but overall there was type I dysfunction with normal leaflet mobility.  The jet of regurgitation was brought in central. The mitral annulus was dilated. The aortic valve was trileaflet. There was at least moderate central aortic insufficiency.  There was normal right ventricular size and function. The tricuspid annulus was mildly dilated, measuring 4.1 cm. Initially there appeared to be only mild tricuspid regurgitation.  The patient's chest, abdomen, both groins, and both lower extremities are prepared and draped in a sterile manner. A time out procedure is performed.   Surgical Approach:  A median sternotomy incision was performed and the pericardium is opened. The ascending aorta is normal in appearance.    Extracorporeal Cardiopulmonary Bypass and Myocardial Protection:  The right common femoral vein is cannulated using the Seldinger technique and a guidewire advanced into the right atrium using TEE guidance.  The patient is heparinized systemically and the right common femoral vein cannulated using a 22 Fr long femoral venous cannula.  The ascending aorta is cannulated for cardiopulmonary bypass.  Adequate heparinization is verified.   A retrograde cardioplegia cannula is placed through the right atrium into the coronary sinus.   The entire pre-bypass portion of the operation was notable for stable  hemodynamics.  Cardiopulmonary bypass was begun and the surface of the heart is inspected.  A second venous cannula is placed directly into the superior vena cava.   A cardioplegia cannula is placed in the ascending aorta.  A temperature probe was placed in the interventricular septum.  The operative field is continuously flooded with carbon dioxide.  The patient is cooled to 32C systemic temperature.  The aortic cross clamp is applied and cold blood cardioplegia is delivered initially in an antegrade fashion through the aortic root.   Supplemental cardioplegia is given retrograde through the coronary sinus catheter.  Iced saline slush is applied for topical hypothermia.  The initial cardioplegic arrest is rapid with early diastolic arrest.  Repeat doses of cardioplegia are administered intermittently throughout the entire cross clamp portion of the operation through the aortic root and through the coronary sinus catheter in order to maintain completely flat electrocardiogram and septal myocardial temperature below 15C.  Myocardial protection was felt to be excellent.   Maze Procedure (left atrial lesion set):  The AtriCure Synergy bipolar radiofrequency ablation clamp is used for all radiofrequency ablation lesions for the maze procedure.  The Atricure CryoICE nitrous oxide cryothermy system is utilized for all cryothermy ablation lesions.   The heart is retracted towards the surgeon's side and the left sided pulmonary veins exposed.  An elliptical ablation lesion is created around the base of the left sided pulmonary veins.  A similar elliptical lesion was created around the base of the left atrial appendage.  The left atrial appendage was obliterated using an Atricure left atrial appendage clip (Atricure Pro245 clip, size 45mm).  The heart was replaced into the pericardial sac.  A left atriotomy incision was performed through the interatrial groove and extended partially across the back wall of  the left atrium after opening the oblique sinus inferiorly.  The floor of the left atrium and the mitral valve were exposed using a self-retaining retractor.    An ablation lesion was placed around the right sided pulmonary veins using the bipolar clamp with one limb of the clamp along the endocardial surface and one along the epicardial surface posteriorly.  A bipolar ablation lesion was placed across the dome of the left atrium from the cephalad apex of the atriotomy incision to reach the cephalad apex of the elliptical lesion around the left sided pulmonary veins.  A similar bipolar lesion was placed across the back wall of the left atrium from the caudad apex of the atriotomy incision to reach the caudad apex of the elliptical lesion around the left sided pulmonary veins, thereby completing a box.  Finally another bipolar lesion was placed across the back wall of the left atrium from the caudad apex of the atriotomy incision towards the posterior mitral valve annulus.  This lesion was completed along the endocardial surface onto the posterior mitral annulus with a 3 minute duration cryothermy lesion, followed by a second cryothermy lesion along the posterior epicardial surface of the left atrium to the coronary sinus.  This completes the entire left side lesion set of the Cox maze procedure.   Closure of Patent Foramen Ovale:  The intra-atrial septum is examined from within the left atrium. There is an obvious patent foramen ovale. This is closed using a 2 layer closure of running 4-0 Prolene suture.   Mitral Valve Repair:  The mitral valve is exposed using a self-retaining retractor.  The mitral valve was inspected and notable for very mild thickening and sclerosis of the mitral leaflets but overall normal leaflet mobility. There is no mitral valve prolapse. There is no calcification.   Interrupted 2-0 Ethibond horizontal mattress sutures were placed circumferentially around the entire mitral  annulus.  The valve was tested with saline and appeared competent even without ring annuloplasty complete. The  valve was sized to a 26 mm annuloplasty ring, based upon the transverse distance between the left and right commissures and the height of the anterior leaflet, corresponding to a size just slightly smaller than the overall surface area of the anterior leaflet.  The valve is downsized because of the underlying etiology associated with dilated nonischemic cardiomyopathy.  A Sorin Memo 3D annuloplasty ring (size 26mm, catalog [id number], serial [id number]) was secured in place uneventfully. The valve was tested with saline and appeared competent. There is no residual leak.  The atriotomy was closed using a 2-layer closure of running 3-0 Prolene suture after placing a sump drain across the mitral valve to serve as a left ventricular vent.     Aortic Valve Replacement:  An oblique transverse aortotomy incision was performed.  The aortic valve was inspected.  The aortic valve is trileaflet. There is moderate nodular fibrosis in the midportion of all 3 leaflets causing malcoaptation. There is no prolapse. The valve is not fibrotic to suggest a rheumatic etiology..  The aortic valve leaflets were excised sharply.  Decalcification is not required.  The aortic annulus was sized to accept a 23 mm prosthesis.  The aortic root and left ventricle were irrigated with copious cold saline solution.  Aortic valve replacement was performed using interrupted horizontal mattress 2-0 Ethibond pledgeted sutures with pledgets in the subannular position.  An [facility] Ease pericardial tissue valve (size 23 mm, model # 3300TFX, serial # [id number]) was implanted uneventfully. The valve seated appropriately with adequate space beneath the left main and right coronary artery.  The aortotomy is closed using a 2 layer closure of running 4-0 Prolene suture.  One final dose of warm retrograde "hot shot" cardioplegia was administered  retrograde through the coronary sinus catheter while all air was evacuated through the aortic root.  The aortic cross clamp was removed after a total cross clamp time of 147 minutes.   Maze Procedure (right atrial lesion set):  The retrograde cardioplegia cannula was removed and the small hole in the right atrium extended a short distance.  The AtriCure Synergy bipolar radiofrequency ablation clamp is utilized to create a series of linear lesions in the right atrium, each with one limb of the clamp along the endocardial surface and the other along the epicardial surface. The first lesion is placed from the posterior apex of the atriotomy incision and along the lateral wall of the right atrium to reach the lateral aspect of the superior vena cava. A second lesion is placed in the opposite direction from the posterior apex of the atriotomy incision along the lateral wall to reach the lateral aspect of the inferior vena cava. A third lesion is placed from the midportion of the atriotomy incision extending at a right angle to reach the tip of the right atrial appendage. A fourth lesion is placed from the anterior apex of the atriotomy incision in an anterior and inferior direction to reach the acute margin of the heart. Finally, the cryotherapy probe is utilized to complete the right atrial lesion set by placing the probe along the endocardial surface of the right atrium from the anterior apex of the atriotomy incision to reach the tricuspid annulus at the 2:00 position. The right atriotomy incision is closed with a 2 layer closure of running 4-0 Prolene suture.   Tricuspid Valve Repair:  Epicardial pacing wires are fixed to the right ventricular outflow tract and to the right atrial appendage. The patient is rewarmed to 37C temperature. The aortic  and left ventricular vents are removed.  The patient is weaned and disconnected from cardiopulmonary bypass.  The patient's rhythm at separation from bypass was AV  paced.  The patient was weaned from cardiopulmonary bypass without any inotropic support. Cardiopulmonary bypass time for the operation was 179 minutes.  Followup transesophageal echocardiogram performed after separation from bypass revealed a well-seated mitral annuloplasty ring and a mitral valve that was functioning normally and without any residual mitral regurgitation.  There was a well-seated bioprosthetic tissue valve in the aortic position that was functioning normally. There was no aortic insufficiency. Left ventricular function was unchanged from preoperatively.  Right ventricular function appeared normal. There was severe tricuspid regurgitation. There were large V waves noted on the central venous pressure tracing.  A decision was made to proceed with tricuspid annuloplasty. Cardiopulmonary bypass was resumed. Umbilical tapes were placed around the superior and inferior vena cava.  The inferior vena cava cannula was pulled down until the tip was just below the junction between the right atrium and the inferior vena cava and both umbilical tapes were secured. An oblique incision is made in the right atrium. Traction sutures are placed to facilitate exposure of the tricuspid valve. The tricuspid valve is inspected carefully. The tricuspid valve leaflets appear normal with normal mobility and no sign of any fibrosis or thickening. Ring annuloplasty is performed using interrupted 2-0 Ethibond horizontal mattress sutures placed circumferentially around the tricuspid annulus with exception of the area immediately below the triangle of Koch. The valve was sized to accept a 26 mm annuloplasty ring based upon the overall surface area of the combined anterior and posterior leaflets. An [facility] 3 annuloplasty ring (size 26mm, model #4900, serial [id number]) is implanted uneventfully. After completion of the annuloplasty the valve was tested with saline and appears to be competent. The right atriotomy incision  is closed using a 2 layer closure of running 4-0 Prolene suture.  The patient is weaned and disconnected from cardiopulmonary bypass.  The patient's rhythm at separation from bypass was AV paced.  The patient was weaned from cardiopulmonary bypass without any inotropic support. The second period of cardiopulmonary bypass duration was 52 minutes such that the grand total cardiopulmonary bypass time for the operation was 231 minutes.  Followup transesophageal echocardiogram performed after separation from bypass revealed a well-seated mitral annuloplasty ring and a mitral valve that was functioning normally and without any residual mitral regurgitation.  There was a well-seated bioprosthetic tissue valve in the aortic position that was functioning normally. There was no aortic insufficiency. Left ventricular function was unchanged from preoperatively.  Right ventricular function appeared normal. There was mild (1+/2+) residual tricuspid regurgitation.    Procedure Completion:  The aortic and superior vena cava cannula were removed uneventfully. Protamine was administered to reverse the anticoagulation. The femoral venous cannula was removed and manual pressure held on the groin for 30 minutes.  The mediastinum and pleural space were inspected for hemostasis and irrigated with saline solution. The mediastinum and both pleural spaces were drained using 4 chest tubes placed through separate stab incisions inferiorly.  The soft tissues anterior to the aorta were reapproximated loosely. The sternum is closed with double strength sternal wire. The soft tissues anterior to the sternum were closed in multiple layers and the skin is closed with a running subcuticular skin closure.   The post-bypass portion of the operation was notable for stable rhythm and hemodynamics.   No blood products were administered during the operation.   Disposition:  The patient tolerated the procedure well.  The patient was  transported to the surgical intensive care unit in stable condition. There were no intraoperative complications. All sponge instrument and needle counts are verified correct at completion of the operation.    Salvatore Decent. Cornelius Moras MD 04/13/2016 2:52 PM

## 2016-04-13 NOTE — Progress Notes (Signed)
      [address]       [place] [address]             [phone]      S/p Bent[place], Mitral and Tricuspid repair  Intubated, sedated  PA= 36/23 CI 1.4 on milrinone 0.3  BP 92/72   Pulse 63   Temp 97 F (36.1 C)   Resp 16   SpO2 100%    Intake/Output Summary (Last 24 hours) at 04/13/16 1721 Last data filed at 04/13/16 1700  Gross per 24 hour  Intake          4975.69 ml  Output             3125 ml  Net          1850.69 ml   Hct= 29 Minimal bleeding  Viviann Spare C. Dorris Fetch, MD [facility] [phone]

## 2016-04-13 NOTE — Anesthesia Preprocedure Evaluation (Addendum)
Anesthesia Evaluation  Patient identified by MRN, date of birth, ID band Patient awake    Reviewed: Allergy & Precautions, H&P , NPO status , Patient's Chart, lab work & pertinent test results  History of Anesthesia Complications (+) PONV and history of anesthetic complications  Airway Mallampati: II   Neck ROM: full    Dental  (+) Teeth Intact   Pulmonary asthma ,    breath sounds clear to auscultation       Cardiovascular hypertension, +CHF  + dysrhythmias Atrial Fibrillation + Valvular Problems/Murmurs AI and MR  Rhythm:irregular Rate:Normal  EF 25%.    Neuro/Psych PSYCHIATRIC DISORDERS Anxiety    GI/Hepatic GERD  ,  Endo/Other  Hypothyroidism   Renal/GU      Musculoskeletal  (+) Arthritis , Fibromyalgia -  Abdominal   Peds  Hematology   Anesthesia Other Findings   Reproductive/Obstetrics                            Anesthesia Physical Anesthesia Plan  ASA: III  Anesthesia Plan: General   Post-op Pain Management:    Induction: Intravenous  Airway Management Planned: Oral ETT  Additional Equipment: Arterial line, CVP, PA Cath, TEE, 3D TEE and Ultrasound Guidance Line Placement  Intra-op Plan:   Post-operative Plan: Post-operative intubation/ventilation  Informed Consent: I have reviewed the patients History and Physical, chart, labs and discussed the procedure including the risks, benefits and alternatives for the proposed anesthesia with the patient or authorized representative who has indicated his/her understanding and acceptance.     Plan Discussed with: CRNA, Anesthesiologist and Surgeon  Anesthesia Plan Comments:         Anesthesia Quick Evaluation

## 2016-04-13 NOTE — OR Nursing (Signed)
SICU Calls:   First call at [address]);  Second call at [address] [facility]);         Frances Maywood Luqman Perrelli,RN

## 2016-04-13 NOTE — Anesthesia Procedure Notes (Signed)
Procedure Name: Intubation Date/Time: 04/13/2016 7:42 AM Performed by: Quentin Ore Pre-anesthesia Checklist: Patient identified, Emergency Drugs available, Suction available and Patient being monitored Patient Re-evaluated:Patient Re-evaluated prior to inductionOxygen Delivery Method: Circle system utilized Preoxygenation: Pre-oxygenation with 100% oxygen Intubation Type: IV induction Ventilation: Mask ventilation without difficulty Laryngoscope Size: Mac and 3 Grade View: Grade I Tube type: Oral Tube size: 8.0 mm Number of attempts: 1 Airway Equipment and Method: Stylet Placement Confirmation: ETT inserted through vocal cords under direct vision,  positive ETCO2 and breath sounds checked- equal and bilateral Secured at: 21 cm Tube secured with: Tape Dental Injury: Teeth and Oropharynx as per pre-operative assessment

## 2016-04-14 ENCOUNTER — Encounter (HOSPITAL_COMMUNITY): Payer: Self-pay | Admitting: Thoracic Surgery (Cardiothoracic Vascular Surgery)

## 2016-04-14 ENCOUNTER — Inpatient Hospital Stay (HOSPITAL_COMMUNITY): Payer: Medicare Other

## 2016-04-14 LAB — BASIC METABOLIC PANEL
ANION GAP: 3 — AB (ref 5–15)
BUN: 10 mg/dL (ref 6–20)
CHLORIDE: 112 mmol/L — AB (ref 101–111)
CO2: 21 mmol/L — ABNORMAL LOW (ref 22–32)
Calcium: 7.8 mg/dL — ABNORMAL LOW (ref 8.9–10.3)
Creatinine, Ser: 0.95 mg/dL (ref 0.44–1.00)
GFR calc Af Amer: >60 mL/min (ref >60)
GLUCOSE: 136 mg/dL — AB (ref 65–99)
POTASSIUM: 3.7 mmol/L (ref 3.5–5.1)
Sodium: 136 mmol/L (ref 135–145)

## 2016-04-14 LAB — POCT I-STAT 4, (NA,K, GLUC, HGB,HCT)
Glucose, Bld: 107 mg/dL — ABNORMAL HIGH (ref 65–99)
HEMATOCRIT: 26 % — AB (ref 36.0–46.0)
Hemoglobin: 8.8 g/dL — ABNORMAL LOW (ref 12.0–15.0)
Potassium: 3.6 mmol/L (ref 3.5–5.1)
Sodium: 142 mmol/L (ref 135–145)

## 2016-04-14 LAB — POCT I-STAT 3, ART BLOOD GAS (G3+)
ACID-BASE DEFICIT: 5 mmol/L — AB (ref 0.0–2.0)
Acid-base deficit: 6 mmol/L — ABNORMAL HIGH (ref 0.0–2.0)
BICARBONATE: 19 mmol/L — AB (ref 20.0–28.0)
Bicarbonate: 19.3 mmol/L — ABNORMAL LOW (ref 20.0–28.0)
O2 SAT: 96 %
O2 SAT: 95 %
PCO2 ART: 32.2 mmHg (ref 32.0–48.0)
PCO2 ART: 35.3 mmHg (ref 32.0–48.0)
PH ART: 7.381 (ref 7.350–7.450)
PH ART: 7.346 — AB (ref 7.350–7.450)
PO2 ART: 86 mmHg (ref 83.0–108.0)
PO2 ART: 79 mmHg — AB (ref 83.0–108.0)
Patient temperature: 37.4
Patient temperature: 37.3
TCO2: 20 mmol/L (ref 0–100)
TCO2: 20 mmol/L (ref 0–100)

## 2016-04-14 LAB — ECHO TEE
LVOT area: 3.46 cm2
LVOTD: 21 mm

## 2016-04-14 LAB — CBC
HCT: 24.6 % — ABNORMAL LOW (ref 36.0–46.0)
HCT: 25.9 % — ABNORMAL LOW (ref 36.0–46.0)
HEMOGLOBIN: 8 g/dL — AB (ref 12.0–15.0)
Hemoglobin: 8 g/dL — ABNORMAL LOW (ref 12.0–15.0)
MCH: 28.6 pg (ref 26.0–34.0)
MCH: 27.7 pg (ref 26.0–34.0)
MCHC: 32.5 g/dL (ref 30.0–36.0)
MCHC: 30.9 g/dL (ref 30.0–36.0)
MCV: 87.9 fL (ref 78.0–100.0)
MCV: 89.6 fL (ref 78.0–100.0)
PLATELETS: 115 10*3/uL — AB (ref 150–400)
PLATELETS: 80 10*3/uL — AB (ref 150–400)
RBC: 2.8 MIL/uL — AB (ref 3.87–5.11)
RBC: 2.89 MIL/uL — AB (ref 3.87–5.11)
RDW: 16.2 % — ABNORMAL HIGH (ref 11.5–15.5)
RDW: 16.3 % — AB (ref 11.5–15.5)
WBC: 8.6 10*3/uL (ref 4.0–10.5)
WBC: 12.4 10*3/uL — AB (ref 4.0–10.5)

## 2016-04-14 LAB — POCT I-STAT, CHEM 8
BUN: 8 mg/dL (ref 6–20)
CALCIUM ION: 1.13 mmol/L — AB (ref 1.15–1.40)
Chloride: 111 mmol/L (ref 101–111)
Creatinine, Ser: 0.8 mg/dL (ref 0.44–1.00)
Glucose, Bld: 116 mg/dL — ABNORMAL HIGH (ref 65–99)
HCT: 22 % — ABNORMAL LOW (ref 36.0–46.0)
Hemoglobin: 7.5 g/dL — ABNORMAL LOW (ref 12.0–15.0)
Potassium: 3.7 mmol/L (ref 3.5–5.1)
SODIUM: 143 mmol/L (ref 135–145)
TCO2: 21 mmol/L (ref 0–100)

## 2016-04-14 LAB — GLUCOSE, CAPILLARY
GLUCOSE-CAPILLARY: 115 mg/dL — AB (ref 65–99)
GLUCOSE-CAPILLARY: 131 mg/dL — AB (ref 65–99)
GLUCOSE-CAPILLARY: 138 mg/dL — AB (ref 65–99)
GLUCOSE-CAPILLARY: 138 mg/dL — AB (ref 65–99)
Glucose-Capillary: 107 mg/dL — ABNORMAL HIGH (ref 65–99)
Glucose-Capillary: 99 mg/dL (ref 65–99)

## 2016-04-14 LAB — CREATININE, SERUM
CREATININE: 0.91 mg/dL (ref 0.44–1.00)
GFR calc Af Amer: >60 mL/min (ref >60)
GFR calc non Af Amer: >60 mL/min (ref >60)

## 2016-04-14 LAB — MAGNESIUM
MAGNESIUM: 2.4 mg/dL (ref 1.7–2.4)
Magnesium: 2 mg/dL (ref 1.7–2.4)

## 2016-04-14 MED ORDER — WARFARIN SODIUM 2.5 MG PO TABS
2.5000 mg | ORAL_TABLET | Freq: Every day | ORAL | Status: DC
Start: 2016-04-14 — End: 2016-04-20
  Administered 2016-04-14 – 2016-04-20 (×7): 2.5 mg via ORAL
  Filled 2016-04-14 (×7): qty 1

## 2016-04-14 MED ORDER — FUROSEMIDE 10 MG/ML IJ SOLN
20.0000 mg | Freq: Four times a day (QID) | INTRAMUSCULAR | Status: AC
  Administered 2016-04-14 – 2016-04-17 (×12): 20 mg via INTRAVENOUS
  Filled 2016-04-14 (×12): qty 2

## 2016-04-14 MED ORDER — AMIODARONE HCL 200 MG PO TABS
200.0000 mg | ORAL_TABLET | Freq: Two times a day (BID) | ORAL | Status: DC
  Administered 2016-04-14 – 2016-04-20 (×13): 200 mg via ORAL
  Filled 2016-04-14 (×13): qty 1

## 2016-04-14 MED ORDER — SODIUM CHLORIDE 0.9 % IV SOLN
30.0000 meq | Freq: Once | INTRAVENOUS | Status: AC
  Administered 2016-04-14: 30 meq via INTRAVENOUS
  Filled 2016-04-14: qty 15

## 2016-04-14 MED ORDER — WARFARIN - PHYSICIAN DOSING INPATIENT
Freq: Every day | Status: DC
  Administered 2016-04-14 – 2016-04-15 (×2)
  Administered 2016-04-16: 1

## 2016-04-14 MED ORDER — SODIUM CHLORIDE 0.9 % IV SOLN
30.0000 meq | Freq: Once | INTRAVENOUS | Status: AC
  Administered 2016-04-14: 30 meq via INTRAVENOUS

## 2016-04-14 MED FILL — Heparin Sodium (Porcine) Inj 1000 Unit/ML: INTRAMUSCULAR | Qty: 20 | Status: AC

## 2016-04-14 MED FILL — Sodium Bicarbonate IV Soln 8.4%: INTRAVENOUS | Qty: 100 | Status: AC

## 2016-04-14 MED FILL — Mannitol IV Soln 20%: INTRAVENOUS | Qty: 1000 | Status: AC

## 2016-04-14 MED FILL — Lidocaine HCl IV Inj 20 MG/ML: INTRAVENOUS | Qty: 5 | Status: AC

## 2016-04-14 MED FILL — Sodium Chloride IV Soln 0.9%: INTRAVENOUS | Qty: 2000 | Status: AC

## 2016-04-14 MED FILL — Electrolyte-R (PH 7.4) Solution: INTRAVENOUS | Qty: 4000 | Status: AC

## 2016-04-14 NOTE — Anesthesia Postprocedure Evaluation (Signed)
Anesthesia Post Note  Patient: Allison [name]  Procedure(s) Performed: Procedure(s) (LRB): AORTIC VALVE REPLACEMENT (AVR) implanted with Magna Ease size 39mm (N/A) MAZE (N/A) TRANSESOPHAGEAL ECHOCARDIOGRAM (TEE) (N/A) PATENT FORAMEN OVALE (PFO) CLOSURE (N/A) MITRAL VALVE REPAIR (MVR) using 29mm Sorin /MEMO 3D annuloplasty ring (N/A) TRICUSPID VALVE REPAIR using a T26 MC3 Edwards annuloplasty ring (N/A)  Patient location during evaluation: ICU Anesthesia Type: General Level of consciousness: sedated and patient remains intubated per anesthesia plan Pain management: pain level controlled Vital Signs Assessment: post-procedure vital signs reviewed and stable Respiratory status: patient remains intubated per anesthesia plan and patient on ventilator - see flowsheet for VS Cardiovascular status: stable Anesthetic complications: no    Last Vitals:  Vitals:   04/14/16 0700 04/14/16 0918  BP: (!) 80/50   Pulse: 81   Resp: 13   Temp: 37.1 C 36.8 C    Last Pain:  Vitals:   04/14/16 0918  TempSrc: Oral  PainSc:                  Allison [name] S

## 2016-04-14 NOTE — Progress Notes (Signed)
Patient ID: Allison [name], female   DOB: Jan 06, 1951, 65 y.o.   MRN: [id number] EVENING ROUNDS NOTE :     [address]       [name] [address]             [phone]                 1 Day Post-Op Procedure(s) (LRB): AORTIC VALVE REPLACEMENT (AVR) implanted with Magna Ease size 93mm (N/A) MAZE (N/A) TRANSESOPHAGEAL ECHOCARDIOGRAM (TEE) (N/A) PATENT FORAMEN OVALE (PFO) CLOSURE (N/A) MITRAL VALVE REPAIR (MVR) using 64mm Sorin /MEMO 3D annuloplasty ring (N/A) TRICUSPID VALVE REPAIR using a T26 MC3 Edwards annuloplasty ring (N/A)  Total Length of Stay:  LOS: 1 day  BP 111/79   Pulse 87   Temp 97.6 F (36.4 C) (Oral)   Resp 14   SpO2 99%   .Intake/Output      12/14 0701 - 12/15 0700 12/15 0701 - 12/16 0700   I.V. [id number]    Blood 200    NG/GT 60    IV Piggyback 2480    Total Intake 8413.2     Urine 3545 350   Emesis/NG output 0    Blood 400    Chest Tube 1060 600   Total Output 5005 950   Net +3408.2 -950          . sodium chloride    . milrinone 0.2 mcg/kg/min (04/14/16 1400)  . phenylephrine (NEO-SYNEPHRINE) Adult infusion 20 mcg/min (04/14/16 1400)     Lab Results  Component Value Date   WBC 12.4 (H) 04/14/2016   HGB 8.0 (L) 04/14/2016   HCT 25.9 (L) 04/14/2016   PLT 80 (L) 04/14/2016   GLUCOSE 116 (H) 04/14/2016   ALT 18 04/10/2016   AST 19 04/10/2016   NA 143 04/14/2016   K 3.7 04/14/2016   CL 111 04/14/2016   CREATININE 0.91 04/14/2016   BUN 8 04/14/2016   CO2 21 (L) 04/14/2016   TSH 1.30 12/01/2014   INR 1.56 04/13/2016   HGBA1C 5.9 (H) 04/10/2016   Walked in hall 100 feet Up to chair awake and alert  Watch plt count  Delight Ovens MD  Beeper [phone] Office [phone] 04/14/2016 5:46 PM

## 2016-04-14 NOTE — Progress Notes (Signed)
Wean protocol initiated. 

## 2016-04-14 NOTE — Progress Notes (Addendum)
TCTS DAILY ICU PROGRESS NOTE                   [address]            [place] [address]          [phone]   1 Day Post-Op Procedure(s) (LRB): AORTIC VALVE REPLACEMENT (AVR) implanted with Magna Ease size 4m (N/A) MAZE (N/A) TRANSESOPHAGEAL ECHOCARDIOGRAM (TEE) (N/A) PATENT FORAMEN OVALE (PFO) CLOSURE (N/A) MITRAL VALVE REPAIR (MVR) using 212mSorin /MEMO 3D annuloplasty ring (N/A) TRICUSPID VALVE REPAIR using a T26 MC3 Edwards annuloplasty ring (N/A)  Total Length of Stay:  LOS: 1 day   Subjective: Feels ok, c/o some chest congestion   Objective: Vital signs in last 24 hours: Temp:  [96.6 F (35.9 C)-99.3 F (37.4 C)] 98.8 F (37.1 C) (12/15 0700) Pulse Rate:  [45-106] 81 (12/15 0700) Cardiac Rhythm: Atrial fibrillation (12/15 0000) Resp:  [10-25] 13 (12/15 0700) BP: (77-123)/(45-72) 80/50 (12/15 0700) SpO2:  [92 %-100 %] 97 % (12/15 0700) Arterial Line BP: (92-132)/(44-73) 98/46 (12/15 0700) FiO2 (%):  [40 %-70 %] 40 % (12/15 0036)  There were no vitals filed for this visit.  Weight change:    Hemodynamic parameters for last 24 hours: PAP: (33-43)/(19-26) 40/21 CO:  [2.6 L/min-6.1 L/min] 4.8 L/min CI:  [1.3 L/min/m2-3 L/min/m2] 2.4 L/min/m2  Intake/Output from previous day: 12/14 0701 - 12/15 0700 In: 8413.2 [I.V.:5673.2; Blood:200; NG/GT:60; IV Piggyback:2480] Out: 5005 [U[UDODQ:5500Blood:400; Chest Tube:1060]  Intake/Output this shift: No intake/output data recorded.  Current Meds: Scheduled Meds: . acetaminophen  1,000 mg Oral Q6H  . aspirin EC  325 mg Oral Daily  . bisacodyl  10 mg Oral Daily   Or  . bisacodyl  10 mg Rectal Daily  . cefUROXime (ZINACEF)  IV  1.5 g Intravenous Q12H  . chlorhexidine gluconate (MEDLINE KIT)  15 mL Mouth Rinse BID  . docusate sodium  200 mg Oral Daily  . insulin aspart  0-24 Units Subcutaneous Q4H  . levothyroxine  125 mcg Oral QAC breakfast  . mouth rinse  15 mL Mouth Rinse QID  . [START ON  04/15/2016] pantoprazole  40 mg Oral Daily  . sodium chloride flush  3 mL Intravenous Q12H  . warfarin  2.5 mg Oral q1800  . Warfarin - Physician Dosing Inpatient   Does not apply q1800   Continuous Infusions: . sodium chloride    . milrinone 0.3 mcg/kg/min (04/14/16 0600)  . phenylephrine (NEO-SYNEPHRINE) Adult infusion 40 mcg/min (04/14/16 0600)   PRN Meds:.metoprolol, morphine injection, ondansetron (ZOFRAN) IV, oxyCODONE, sodium chloride flush, traMADol  General appearance: alert, cooperative and no distress Heart: irregularly irregular rhythm Lungs: dim in bases Abdomen: soft, nontender Extremities: + edema Wound: dressings intact  Lab Results: CBC: Recent Labs  04/13/16 2130 04/14/16 0200  WBC 9.3 8.6  HGB 8.1* 8.0*  HCT 25.4* 24.6*  PLT 108* 115*   BMET:  Recent Labs  04/13/16 2124 04/13/16 2130 04/14/16 0200  NA 140  --  136  K 4.2  --  3.7  CL 110  --  112*  CO2  --   --  21*  GLUCOSE 136*  --  136*  BUN 11  --  10  CREATININE 0.80 1.00 0.95  CALCIUM  --   --  7.8*    CMET: Lab Results  Component Value Date   WBC 8.6 04/14/2016   HGB 8.0 (L) 04/14/2016   HCT 24.6 (L) 04/14/2016  PLT 115 (L) 04/14/2016   GLUCOSE 136 (H) 04/14/2016   ALT 18 04/10/2016   AST 19 04/10/2016   NA 136 04/14/2016   K 3.7 04/14/2016   CL 112 (H) 04/14/2016   CREATININE 0.95 04/14/2016   BUN 10 04/14/2016   CO2 21 (L) 04/14/2016   TSH 1.30 12/01/2014   INR 1.56 04/13/2016   HGBA1C 5.9 (H) 04/10/2016    PT/INR:  Recent Labs  04/13/16 1531  LABPROT 18.8*  INR 1.56   Radiology: Dg Chest Port 1 View  Result Date: 04/14/2016 CLINICAL DATA:  Mitral, tricuspid, and aortic valve disease with valve replacements EXAM: PORTABLE CHEST 1 VIEW COMPARISON:  April 13, 2016 FINDINGS: Endotracheal tube and nasogastric tube have been removed. Swan-Ganz catheter tip is in the main pulmonary outflow tract directed toward the right. There is a mediastinal drain and bilateral  chest tubes. Temporary pacemaker wires are attached right heart. No pneumothorax. There is patchy interstitial and alveolar edema bilaterally with consolidation in the left lower lobe, slightly increased from 1 day prior. There are small pleural effusions bilaterally. Heart is mildly enlarged with mild pulmonary venous hypertension. The patient is status post mitral, aortic, tricuspid valve replacements. Patient is status post right total shoulder replacement. There are surgical clips in the upper midline thoracic region, stable. IMPRESSION: Tube and catheter positions as described without pneumothorax. There is a degree of underlying congestive heart failure. Increase consolidation in the left base probably represents a combination of edema atelectasis. Stable cardiac silhouette. Electronically Signed   By: Lowella Grip III M.D.   On: 04/14/2016 07:36   Dg Chest Port 1 View  Result Date: 04/13/2016 CLINICAL DATA:  As post open heart surgery with 3 valves replaced. Assess support tubes. EXAM: PORTABLE CHEST 1 VIEW COMPARISON:  PA and lateral chest x-ray of April 10, 2016 FINDINGS: The lungs are mildly hypoinflated. The interstitial markings are increased. The cardiac silhouette is enlarged. The pulmonary vascularity is indistinct. Prosthetic valve rings in the aortic mitral and tricuspid positions are present. The endotracheal tube tip lies approximately 3 cm above the carina. The esophagogastric tube tip and proximal port project below the GE junction. The Swan-Ganz catheter tip projects at the margin of the right and left main pulmonary arteries. The left atrial appendage clip is in reasonable position. A mediastinal drain has its tip projecting at approximately the T6 level. The tip of the left-sided chest tube overlies the lateral aspect of the left fifth rib. The right-sided chest tube tip projects over the posterolateral aspect of the sixth rib. IMPRESSION: Mild cardiomegaly with moderate  pulmonary edema. Left lower lobe atelectasis. No large pleural effusion and no pneumothorax. The support tubes are in reasonable position. Electronically Signed   By: David  Martinique M.D.   On: 04/13/2016 15:45     Assessment/Plan: S/P Procedure(s) (LRB): AORTIC VALVE REPLACEMENT (AVR) implanted with Magna Ease size 6m (N/A) MAZE (N/A) TRANSESOPHAGEAL ECHOCARDIOGRAM (TEE) (N/A) PATENT FORAMEN OVALE (PFO) CLOSURE (N/A) MITRAL VALVE REPAIR (MVR) using 262mSorin /MEMO 3D annuloplasty ring (N/A) TRICUSPID VALVE REPAIR using a T26 MC3 Edwards annuloplasty ring (N/A)  1 doing well POD1 2 chronic afib, s/p maze - rate controlled, coumadin being started 3 hemodyn stable on milrinone and neo currently 4 expected ABL anemia with current H/H 8/24.6- does not require transfusion currently 5 post op thrombocytopenia- improving trend 6 acute and chronic systolic CHF- diurese when able to wean neo off. Spontaneous diuresis is good so far with normal renal fxn 7  sugars mildly elevated - on SSI/off GTT 8 push pulm toilet as tolerates 9 leave CT's in place for moderate drainage/effus GOLD,WAYNE E 04/14/2016 9:04 AM    I have seen and examined the patient and agree with the assessment and plan as outlined.  Overall doing quite well POD1.  Maintaining stable rhythm/hemodynamics on very low dose milrinone and neo drips.  Mobilize.  D/C Swan-Ganz and Aline.  Leave chest tubes in for now.   Start coumadin slowly.  Gentle diuresis.  Rexene Alberts, MD 04/14/2016

## 2016-04-14 NOTE — Procedures (Signed)
Extubation Procedure Note  Patient Details:   Name: Allison [name] DOB: 24-Sep-1950 MRN: [id number]   Airway Documentation:  Airway 8 mm (Active)  Secured at (cm) 21 cm 04/14/2016 12:13 AM  Measured From Lips 04/14/2016 12:13 AM  Secured Location Right 04/14/2016 12:13 AM  Secured By Pink Tape 04/14/2016 12:13 AM  Site Condition Dry 04/13/2016  3:15 PM    Evaluation  O2 sats: stable throughout Complications: No apparent complications Patient did tolerate procedure well. Bilateral Breath Sounds: Clear   Yes  RT extubated pt. Per rapid wean protocol. Pt. Performed -40 on the NIF and 750L on the vital capacity. Pt. Also had a positive cuff leak. Pt. Was extubated to 4L [place]. Pt. Performed 750x5 on the IS. No apparent complications.  Adela Ports 04/14/2016, 1:22 AM

## 2016-04-15 ENCOUNTER — Inpatient Hospital Stay (HOSPITAL_COMMUNITY): Payer: Medicare Other

## 2016-04-15 LAB — BASIC METABOLIC PANEL
ANION GAP: 8 (ref 5–15)
BUN: 7 mg/dL (ref 6–20)
CALCIUM: 8.7 mg/dL — AB (ref 8.9–10.3)
CO2: 23 mmol/L (ref 22–32)
Chloride: 102 mmol/L (ref 101–111)
Creatinine, Ser: 0.97 mg/dL (ref 0.44–1.00)
GFR, EST NON AFRICAN AMERICAN: 59 mL/min — AB (ref >60)
GLUCOSE: 132 mg/dL — AB (ref 65–99)
Potassium: 3.9 mmol/L (ref 3.5–5.1)
Sodium: 133 mmol/L — ABNORMAL LOW (ref 135–145)

## 2016-04-15 LAB — GLUCOSE, CAPILLARY
Glucose-Capillary: 112 mg/dL — ABNORMAL HIGH (ref 65–99)
Glucose-Capillary: 103 mg/dL — ABNORMAL HIGH (ref 65–99)
Glucose-Capillary: 107 mg/dL — ABNORMAL HIGH (ref 65–99)
Glucose-Capillary: 94 mg/dL (ref 65–99)

## 2016-04-15 LAB — CBC
HCT: 30.4 % — ABNORMAL LOW (ref 36.0–46.0)
HEMOGLOBIN: 9.7 g/dL — AB (ref 12.0–15.0)
MCH: 28.4 pg (ref 26.0–34.0)
MCHC: 31.9 g/dL (ref 30.0–36.0)
MCV: 88.9 fL (ref 78.0–100.0)
PLATELETS: 125 10*3/uL — AB (ref 150–400)
RBC: 3.42 MIL/uL — ABNORMAL LOW (ref 3.87–5.11)
RDW: 16.6 % — ABNORMAL HIGH (ref 11.5–15.5)
WBC: 13.7 10*3/uL — ABNORMAL HIGH (ref 4.0–10.5)

## 2016-04-15 LAB — PROTIME-INR
INR: 1.28
PROTHROMBIN TIME: 16.1 s — AB (ref 11.4–15.2)

## 2016-04-15 MED ORDER — FUROSEMIDE 10 MG/ML IJ SOLN: 40.0000 mg | Freq: Once | INTRAMUSCULAR | Status: DC

## 2016-04-15 NOTE — Progress Notes (Signed)
Patient ID: Allison [name], female   DOB: [birth year], 65 y.o.   MRN: [id number] TCTS DAILY ICU PROGRESS NOTE                   [address]            [name] [address]          [phone]   2 Days Post-Op Procedure(s) (LRB): AORTIC VALVE REPLACEMENT (AVR) implanted with Magna Ease size 11mm (N/A) MAZE (N/A) TRANSESOPHAGEAL ECHOCARDIOGRAM (TEE) (N/A) PATENT FORAMEN OVALE (PFO) CLOSURE (N/A) MITRAL VALVE REPAIR (MVR) using 59mm Sorin /MEMO 3D annuloplasty ring (N/A) TRICUSPID VALVE REPAIR using a T26 MC3 Edwards annuloplasty ring (N/A)  Total Length of Stay:  LOS: 2 days   Subjective: complaint of nausea this am  Objective: Vital signs in last 24 hours: Temp:  [97.5 F (36.4 C)-98.4 F (36.9 C)] 97.9 F (36.6 C) (12/16 0400) Pulse Rate:  [51-87] 66 (12/16 0600) Cardiac Rhythm: Atrial fibrillation (12/16 0100) Resp:  [13-25] 17 (12/16 0600) BP: (92-120)/(49-79) 120/66 (12/16 0600) SpO2:  [96 %-100 %] 100 % (12/16 0600) Arterial Line BP: (104-145)/(42-58) 145/56 (12/15 1500) Weight:  [206 lb 5.6 oz (93.6 kg)] 206 lb 5.6 oz (93.6 kg) (12/16 0500)  Filed Weights   04/15/16 0500  Weight: 206 lb 5.6 oz (93.6 kg)    Weight change:    Hemodynamic parameters for last 24 hours:    Intake/Output from previous day: 12/15 0701 - 12/16 0700 In: 458 [I.V.:143; IV Piggyback:315] Out: 3685 [Urine:2425; Chest Tube:1260]  Intake/Output this shift: No intake/output data recorded.  Current Meds: Scheduled Meds: . acetaminophen  1,000 mg Oral Q6H  . amiodarone  200 mg Oral BID PC  . aspirin EC  325 mg Oral Daily  . bisacodyl  10 mg Oral Daily   Or  . bisacodyl  10 mg Rectal Daily  . docusate sodium  200 mg Oral Daily  . furosemide  20 mg Intravenous Q6H  . insulin aspart  0-24 Units Subcutaneous Q4H  . levothyroxine  125 mcg Oral QAC breakfast  . pantoprazole  40 mg Oral Daily  . sodium chloride flush  3 mL Intravenous Q12H  . warfarin  2.5 mg Oral q1800   . Warfarin - Physician Dosing Inpatient   Does not apply q1800   Continuous Infusions: . sodium chloride    . milrinone Stopped (04/14/16 1615)  . phenylephrine (NEO-SYNEPHRINE) Adult infusion Stopped (04/14/16 1600)   PRN Meds:.metoprolol, morphine injection, ondansetron (ZOFRAN) IV, oxyCODONE, sodium chloride flush, traMADol  General appearance: alert, cooperative and no distress Neurologic: intact Heart: regular rate and rhythm, S1, S2 normal, no murmur, click, rub or gallop Lungs: diminished breath sounds bibasilar Abdomen: soft, non-tender; bowel sounds normal; no masses,  no organomegaly and mild distention, bowel sounds are present , not tender  Extremities: extremities normal, atraumatic, no cyanosis or edema and Homans sign is negative, no sign of DVT Wound: sternum intact  Lab Results: CBC: Recent Labs  04/14/16 1652 04/15/16 0521  WBC 12.4* 13.7*  HGB 8.0* 9.7*  HCT 25.9* 30.4*  PLT 80* 125*   BMET:  Recent Labs  04/14/16 0200 04/14/16 1641 04/14/16 1652 04/15/16 0521  NA 136 143  --  133*  K 3.7 3.7  --  3.9  CL 112* 111  --  102  CO2 21*  --   --  23  GLUCOSE 136* 116*  --  132*  BUN 10 8  --  7  CREATININE 0.95 0.80 0.91 0.97  CALCIUM 7.8*  --   --  8.7*    CMET: Lab Results  Component Value Date   WBC 13.7 (H) 04/15/2016   HGB 9.7 (L) 04/15/2016   HCT 30.4 (L) 04/15/2016   PLT 125 (L) 04/15/2016   GLUCOSE 132 (H) 04/15/2016   ALT 18 04/10/2016   AST 19 04/10/2016   NA 133 (L) 04/15/2016   K 3.9 04/15/2016   CL 102 04/15/2016   CREATININE 0.97 04/15/2016   BUN 7 04/15/2016   CO2 23 04/15/2016   TSH 1.30 12/01/2014   INR 1.28 04/15/2016   HGBA1C 5.9 (H) 04/10/2016    PT/INR:  Recent Labs  04/15/16 0521  LABPROT 16.1*  INR 1.28   Radiology: Dg Chest Port 1 View  Result Date: 04/15/2016 CLINICAL DATA:  Atelectasis EXAM: PORTABLE CHEST 1 VIEW COMPARISON:  04/14/2016 FINDINGS: Interval removal of Swan-Ganz catheter. Bilateral chest  tubes remain in place. No pneumothorax. Cardiomegaly with vascular congestion, low lung volumes and bibasilar atelectasis. Slight improved atelectasis on the left since prior study. IMPRESSION: Low lung volumes with vascular congestion and bibasilar atelectasis. Slight improvement on the left since prior study. Electronically Signed   By: Charlett NoseKevin  Dover M.D.   On: 04/15/2016 07:46     Assessment/Plan: S/P Procedure(s) (LRB): AORTIC VALVE REPLACEMENT (AVR) implanted with Magna Ease size 23mm (N/A) MAZE (N/A) TRANSESOPHAGEAL ECHOCARDIOGRAM (TEE) (N/A) PATENT FORAMEN OVALE (PFO) CLOSURE (N/A) MITRAL VALVE REPAIR (MVR) using 26mm Sorin /MEMO 3D annuloplasty ring (N/A) TRICUSPID VALVE REPAIR using a T26 MC3 Edwards annuloplasty ring (N/A) Mobilize Diuresis d/c tubes/lines Continue foley due to strict I&O and urinary output monitoring Expected Acute  Blood - loss Anemia Coumadin started  Thrombocytopenia improving  Renal function stable- off drips  D/c mt tubes  [name] 04/15/2016 8:03 AM

## 2016-04-15 NOTE — Progress Notes (Signed)
Patient ID: Allison [name], female   DOB: 1951-02-18, 65 y.o.   MRN: [id number] EVENING ROUNDS NOTE :     [address]       [name] [address]             [phone]                 2 Days Post-Op Procedure(s) (LRB): AORTIC VALVE REPLACEMENT (AVR) implanted with Magna Ease size 74mm (N/A) MAZE (N/A) TRANSESOPHAGEAL ECHOCARDIOGRAM (TEE) (N/A) PATENT FORAMEN OVALE (PFO) CLOSURE (N/A) MITRAL VALVE REPAIR (MVR) using 26mm Sorin /MEMO 3D annuloplasty ring (N/A) TRICUSPID VALVE REPAIR using a T26 MC3 Edwards annuloplasty ring (N/A)  Total Length of Stay:  LOS: 2 days  BP (!) 123/54   Pulse 77   Temp 98.4 F (36.9 C) (Oral)   Resp (!) 23   Wt 206 lb 5.6 oz (93.6 kg)   SpO2 94%   BMI 32.32 kg/m   .Intake/Output      12/16 0701 - 12/17 0700   I.V. (mL/kg)    IV Piggyback    Total Intake(mL/kg)    Urine (mL/kg/hr) 1425 (1.2)   Chest Tube 400 (0.3)   Total Output 1825   Net -1825         . sodium chloride       Lab Results  Component Value Date   WBC 13.7 (H) 04/15/2016   HGB 9.7 (L) 04/15/2016   HCT 30.4 (L) 04/15/2016   PLT 125 (L) 04/15/2016   GLUCOSE 132 (H) 04/15/2016   ALT 18 04/10/2016   AST 19 04/10/2016   NA 133 (L) 04/15/2016   K 3.9 04/15/2016   CL 102 04/15/2016   CREATININE 0.97 04/15/2016   BUN 7 04/15/2016   CO2 23 04/15/2016   TSH 1.30 12/01/2014   INR 1.28 04/15/2016   HGBA1C 5.9 (H) 04/10/2016   Stable, but slow to move   Delight Ovens MD  Beeper [phone] Office [phone] 04/15/2016 7:23 PM

## 2016-04-16 ENCOUNTER — Inpatient Hospital Stay (HOSPITAL_COMMUNITY): Payer: Medicare Other

## 2016-04-16 LAB — PROTIME-INR
INR: 1.26
PROTHROMBIN TIME: 15.9 s — AB (ref 11.4–15.2)

## 2016-04-16 LAB — BASIC METABOLIC PANEL
Anion gap: 10 (ref 5–15)
BUN: 10 mg/dL (ref 6–20)
CO2: 22 mmol/L (ref 22–32)
Calcium: 8.6 mg/dL — ABNORMAL LOW (ref 8.9–10.3)
Chloride: 102 mmol/L (ref 101–111)
Creatinine, Ser: 0.84 mg/dL (ref 0.44–1.00)
GFR calc Af Amer: >60 mL/min (ref >60)
GFR calc non Af Amer: >60 mL/min (ref >60)
Glucose, Bld: 97 mg/dL (ref 65–99)
Potassium: 5 mmol/L (ref 3.5–5.1)
Sodium: 134 mmol/L — ABNORMAL LOW (ref 135–145)

## 2016-04-16 LAB — CBC
HCT: 30.5 % — ABNORMAL LOW (ref 36.0–46.0)
Hemoglobin: 10 g/dL — ABNORMAL LOW (ref 12.0–15.0)
MCH: 28.7 pg (ref 26.0–34.0)
MCHC: 32.8 g/dL (ref 30.0–36.0)
MCV: 87.4 fL (ref 78.0–100.0)
Platelets: 192 10*3/uL (ref 150–400)
RBC: 3.49 MIL/uL — ABNORMAL LOW (ref 3.87–5.11)
RDW: 16.7 % — ABNORMAL HIGH (ref 11.5–15.5)
WBC: 13.7 10*3/uL — ABNORMAL HIGH (ref 4.0–10.5)

## 2016-04-16 LAB — GLUCOSE, CAPILLARY: GLUCOSE-CAPILLARY: 82 mg/dL (ref 65–99)

## 2016-04-16 NOTE — Progress Notes (Signed)
Patient ID: Allison [name], female   DOB: 01/04/1951, 65 y.o.   MRN: [id number] TCTS DAILY ICU PROGRESS NOTE                   [address]            [place] [id number]          [phone]   3 Days Post-Op Procedure(s) (LRB): AORTIC VALVE REPLACEMENT (AVR) implanted with Magna Ease size 23mm (N/A) MAZE (N/A) TRANSESOPHAGEAL ECHOCARDIOGRAM (TEE) (N/A) PATENT FORAMEN OVALE (PFO) CLOSURE (N/A) MITRAL VALVE REPAIR (MVR) using 26mm Sorin /MEMO 3D annuloplasty ring (N/A) TRICUSPID VALVE REPAIR using a T26 MC3 Edwards annuloplasty ring (N/A)  Total Length of Stay:  LOS: 3 days   Subjective: Feels better today walked 300 feet this am  Objective: Vital signs in last 24 hours: Temp:  [97.5 F (36.4 C)-98.8 F (37.1 C)] 97.7 F (36.5 C) (12/17 0800) Pulse Rate:  [70-81] 81 (12/17 0800) Cardiac Rhythm: Normal sinus rhythm (12/17 0800) Resp:  [16-28] 18 (12/17 0800) BP: (110-150)/(52-92) 123/61 (12/17 0800) SpO2:  [94 %-99 %] 95 % (12/17 0800) Weight:  [206 lb 9.1 oz (93.7 kg)] 206 lb 9.1 oz (93.7 kg) (12/17 0700)  Filed Weights   04/15/16 0500 04/16/16 0700  Weight: 206 lb 5.6 oz (93.6 kg) 206 lb 9.1 oz (93.7 kg)    Weight change: 3.5 oz (0.1 kg)   Hemodynamic parameters for last 24 hours:    Intake/Output from previous day: 12/16 0701 - 12/17 0700 In: -  Out: 4490 [Urine:4050; Chest Tube:440]  Intake/Output this shift: Total I/O In: 120 [P.O.:120] Out: 170 [Urine:110; Chest Tube:60]  Current Meds: Scheduled Meds: . acetaminophen  1,000 mg Oral Q6H  . amiodarone  200 mg Oral BID PC  . aspirin EC  325 mg Oral Daily  . bisacodyl  10 mg Oral Daily   Or  . bisacodyl  10 mg Rectal Daily  . docusate sodium  200 mg Oral Daily  . furosemide  20 mg Intravenous Q6H  . levothyroxine  125 mcg Oral QAC breakfast  . pantoprazole  40 mg Oral Daily  . sodium chloride flush  3 mL Intravenous Q12H  . warfarin  2.5 mg Oral q1800  . Warfarin - Physician Dosing  Inpatient   Does not apply q1800   Continuous Infusions: . sodium chloride     PRN Meds:.metoprolol, morphine injection, ondansetron (ZOFRAN) IV, oxyCODONE, sodium chloride flush, traMADol  General appearance: alert, cooperative and no distress Neurologic: intact Heart: regular rate and rhythm, S1, S2 normal, no murmur, click, rub or gallop and with pacs Lungs: clear to auscultation bilaterally Abdomen: soft, non-tender; bowel sounds normal; no masses,  no organomegaly Extremities: extremities normal, atraumatic, no cyanosis or edema and Homans sign is negative, no sign of DVT Wound: sternum intact  Lab Results: CBC: Recent Labs  04/15/16 0521 04/16/16 0310  WBC 13.7* 13.7*  HGB 9.7* 10.0*  HCT 30.4* 30.5*  PLT 125* 192   BMET:  Recent Labs  04/15/16 0521 04/16/16 0310  NA 133* 134*  K 3.9 5.0  CL 102 102  CO2 23 22  GLUCOSE 132* 97  BUN 7 10  CREATININE 0.97 0.84  CALCIUM 8.7* 8.6*    CMET: Lab Results  Component Value Date   WBC 13.7 (H) 04/16/2016   HGB 10.0 (L) 04/16/2016   HCT 30.5 (L) 04/16/2016   PLT 192 04/16/2016   GLUCOSE 97 04/16/2016   ALT 18  04/10/2016   AST 19 04/10/2016   NA 134 (L) 04/16/2016   K 5.0 04/16/2016   CL 102 04/16/2016   CREATININE 0.84 04/16/2016   BUN 10 04/16/2016   CO2 22 04/16/2016   TSH 1.30 12/01/2014   INR 1.26 04/16/2016   HGBA1C 5.9 (H) 04/10/2016    PT/INR:  Recent Labs  04/16/16 0310  LABPROT 15.9*  INR 1.26   Radiology: Dg Chest Port 1 View  Result Date: 04/16/2016 CLINICAL DATA:  Followup atelectasis and chest tubes. EXAM: PORTABLE CHEST 1 VIEW COMPARISON:  04/15/2016. FINDINGS: Mildly enlarged cardiac silhouette with improvement. Median sternotomy wires, prosthetic heart valves and left atrial clip. Stable patchy opacity at the left lung base. Bilateral chest tubes without pneumothorax. Surgical clips at the thoracic inlet on the left. Right humeral head prosthesis and left shoulder degenerative changes.  IMPRESSION: Stable left basilar atelectasis, pneumonia or aspiration pneumonitis. Electronically Signed   By: Beckie Salts M.D.   On: 04/16/2016 07:30     Assessment/Plan: S/P Procedure(s) (LRB): AORTIC VALVE REPLACEMENT (AVR) implanted with Magna Ease size 59mm (N/A) MAZE (N/A) TRANSESOPHAGEAL ECHOCARDIOGRAM (TEE) (N/A) PATENT FORAMEN OVALE (PFO) CLOSURE (N/A) MITRAL VALVE REPAIR (MVR) using 23mm Sorin /MEMO 3D annuloplasty ring (N/A) TRICUSPID VALVE REPAIR using a T26 MC3 Edwards annuloplasty ring (N/A) Mobilize Diuresis d/c tubes/lines D/c foley On coumadin inr 1.26 Improving  Renal function stable  Delight Ovens 04/16/2016 9:01 AM

## 2016-04-16 NOTE — Progress Notes (Signed)
Patient ID: Allison [name], female   DOB: June 18, 1950, 65 y.o.   MRN: [id number] EVENING ROUNDS NOTE :     [address]       [place] [address]             [phone]                 3 Days Post-Op Procedure(s) (LRB): AORTIC VALVE REPLACEMENT (AVR) implanted with Magna Ease size 45mm (N/A) MAZE (N/A) TRANSESOPHAGEAL ECHOCARDIOGRAM (TEE) (N/A) PATENT FORAMEN OVALE (PFO) CLOSURE (N/A) MITRAL VALVE REPAIR (MVR) using 22mm Sorin /MEMO 3D annuloplasty ring (N/A) TRICUSPID VALVE REPAIR using a T26 MC3 Edwards annuloplasty ring (N/A)  Total Length of Stay:  LOS: 3 days  BP (!) 154/79 (BP Location: Right Arm)   Pulse 86   Temp 98.9 F (37.2 C)   Resp 19   Ht 5\' 7"  (1.702 m)   Wt 206 lb 9.1 oz (93.7 kg)   SpO2 (!) 88%   BMI 32.35 kg/m   .Intake/Output      12/17 0701 - 12/18 0700   P.O. 1500   Total Intake(mL/kg) 1500 (16)   Urine (mL/kg/hr) 1250 (1.1)   Chest Tube 110 (0.1)   Total Output 1360   Net +140         . sodium chloride       Lab Results  Component Value Date   WBC 13.7 (H) 04/16/2016   HGB 10.0 (L) 04/16/2016   HCT 30.5 (L) 04/16/2016   PLT 192 04/16/2016   GLUCOSE 97 04/16/2016   ALT 18 04/10/2016   AST 19 04/10/2016   NA 134 (L) 04/16/2016   K 5.0 04/16/2016   CL 102 04/16/2016   CREATININE 0.84 04/16/2016   BUN 10 04/16/2016   CO2 22 04/16/2016   TSH 1.30 12/01/2014   INR 1.26 04/16/2016   HGBA1C 5.9 (H) 04/10/2016   Tubes out Feels better this evening  Delight Ovens MD  Beeper [phone] Office [phone] 04/16/2016 7:18 PM

## 2016-04-16 NOTE — Evaluation (Signed)
Physical Therapy Evaluation Patient Details Name: Allison [name] Pies MRN: [id number] DOB: 09/13/1950 Today's Date: 04/16/2016   History of Present Illness  65 year old moderately obese female with history of hypertension, atrial fibrillation, nonischemic cardiomyopathy, chronic combined systolic and diastolic congestive heart failure, mitral regurgitation, atrial fibrillation, and bilateral deafness who is now S/p Bentall, Mitral and Tricuspid repai  Clinical Impression  Patient demonstrates deficits in functional mobility as indicated below. Will need continued skilled PT to address deficits and maximize function. Will see as indicated and progress as tolerated.     Follow Up Recommendations Home health PT;Supervision/Assistance - 24 hour    Equipment Recommendations  Rolling walker with 5" wheels (rollator )    Recommendations for Other Services       Precautions / Restrictions Precautions Precautions: Sternal Restrictions Weight Bearing Restrictions: Yes (Sternal precautions)      Mobility  Bed Mobility               General bed mobility comments: received in recliner  Transfers Overall transfer level: Needs assistance Equipment used: 1 person hand held assist Transfers: Sit to/from Stand Sit to Stand: Min assist         General transfer comment: Visual cues to scoot to edge of chair, pillow provided to hold against chest when standing up  Ambulation/Gait Ambulation/Gait assistance: Min assist Ambulation Distance (Feet): 240 Feet Assistive device: 1 person hand held assist Gait Pattern/deviations: Step-through pattern;Decreased stride length;Drifts right/left Gait velocity: decreased Gait velocity interpretation: Below normal speed for age/gender General Gait Details: 2 standing rest breaks during ambulation, noted instability with HHA (would benefit from use of RW) VSS throughout  Stairs            Wheelchair Mobility    Modified Rankin (Stroke  Patients Only)       Balance Overall balance assessment: Needs assistance   Sitting balance-Leahy Scale: Good     Standing balance support: Single extremity supported Standing balance-Leahy Scale: Fair Standing balance comment: some instability in static standing                             Pertinent Vitals/Pain Pain Assessment: Faces Faces Pain Scale: Hurts little more Pain Location: operative site Pain Descriptors / Indicators: Sore;Operative site guarding;Grimacing Pain Intervention(s): Monitored during session;Patient requesting pain meds-RN notified    Home Living Family/patient expects to be discharged to:: Private residence Living Arrangements: Spouse/significant other Available Help at Discharge: Family Type of Home: House           Additional Comments: need to obtain further home set up when family or interpreter present    Prior Function Level of Independence: Independent               Hand Dominance   Dominant Hand: Right    Extremity/Trunk Assessment   Upper Extremity Assessment Upper Extremity Assessment: Defer to OT evaluation    Lower Extremity Assessment Lower Extremity Assessment: Generalized weakness       Communication   Communication: Deaf  Cognition Arousal/Alertness: Awake/alert Behavior During Therapy: WFL for tasks assessed/performed Overall Cognitive Status: Difficult to assess                      General Comments      Exercises     Assessment/Plan    PT Assessment Patient needs continued PT services  PT Problem List Decreased strength;Decreased activity tolerance;Decreased balance;Decreased mobility;Decreased safety awareness;Cardiopulmonary status limiting activity;Pain  PT Treatment Interventions DME instruction;Gait training;Stair training;Functional mobility training;Therapeutic activities;Therapeutic exercise;Balance training;Patient/family education    PT Goals (Current goals  can be found in the Care Plan section)  Acute Rehab PT Goals Patient Stated Goal: to walk PT Goal Formulation: With patient Time For Goal Achievement: 04/30/16 Potential to Achieve Goals: Good    Frequency Min 4X/week   Barriers to discharge        Co-evaluation               End of Session   Activity Tolerance: Patient tolerated treatment well;Patient limited by fatigue Patient left: in chair;with call bell/phone within reach Nurse Communication: Mobility status;Patient requests pain meds         Time: 4818-5631 PT Time Calculation (min) (ACUTE ONLY): 23 min   Charges:   PT Evaluation $PT Eval High Complexity: 1 Procedure PT Treatments $Gait Training: 8-22 mins   PT G Codes:        Fabio Asa 04-25-2016, 3:46 PM  Charlotte Crumb, PT DPT  [phone]

## 2016-04-17 ENCOUNTER — Inpatient Hospital Stay (HOSPITAL_COMMUNITY): Payer: Medicare Other

## 2016-04-17 LAB — BASIC METABOLIC PANEL
Anion gap: 10 (ref 5–15)
BUN: 11 mg/dL (ref 6–20)
CO2: 30 mmol/L (ref 22–32)
Calcium: 8.8 mg/dL — ABNORMAL LOW (ref 8.9–10.3)
Chloride: 95 mmol/L — ABNORMAL LOW (ref 101–111)
Creatinine, Ser: 0.9 mg/dL (ref 0.44–1.00)
GFR calc Af Amer: >60 mL/min (ref >60)
GFR calc non Af Amer: >60 mL/min (ref >60)
Glucose, Bld: 88 mg/dL (ref 65–99)
Potassium: 3.1 mmol/L — ABNORMAL LOW (ref 3.5–5.1)
Sodium: 135 mmol/L (ref 135–145)

## 2016-04-17 LAB — CBC
HCT: 32 % — ABNORMAL LOW (ref 36.0–46.0)
Hemoglobin: 10.1 g/dL — ABNORMAL LOW (ref 12.0–15.0)
MCH: 28.1 pg (ref 26.0–34.0)
MCHC: 31.6 g/dL (ref 30.0–36.0)
MCV: 88.9 fL (ref 78.0–100.0)
Platelets: 160 10*3/uL (ref 150–400)
RBC: 3.6 MIL/uL — ABNORMAL LOW (ref 3.87–5.11)
RDW: 17.1 % — ABNORMAL HIGH (ref 11.5–15.5)
WBC: 11 10*3/uL — ABNORMAL HIGH (ref 4.0–10.5)

## 2016-04-17 LAB — TYPE AND SCREEN
ABO/RH(D): O POS
Antibody Screen: NEGATIVE
UNIT DIVISION: 0
Unit division: 0

## 2016-04-17 LAB — PROTIME-INR
INR: 1.43
PROTHROMBIN TIME: 17.6 s — AB (ref 11.4–15.2)

## 2016-04-17 MED ORDER — ALPRAZOLAM 0.25 MG PO TABS: 0.2500 mg | ORAL_TABLET | Freq: Two times a day (BID) | ORAL | Status: DC | PRN

## 2016-04-17 MED ORDER — MONTELUKAST SODIUM 10 MG PO TABS
10.0000 mg | ORAL_TABLET | Freq: Every day | ORAL | Status: DC
  Administered 2016-04-17 – 2016-04-20 (×4): 10 mg via ORAL
  Filled 2016-04-17 (×4): qty 1

## 2016-04-17 MED ORDER — CARVEDILOL 3.125 MG PO TABS
3.1250 mg | ORAL_TABLET | Freq: Two times a day (BID) | ORAL | Status: DC
  Administered 2016-04-17 – 2016-04-20 (×8): 3.125 mg via ORAL
  Filled 2016-04-17 (×8): qty 1

## 2016-04-17 MED ORDER — TRAMADOL HCL 50 MG PO TABS
50.0000 mg | ORAL_TABLET | Freq: Four times a day (QID) | ORAL | Status: DC | PRN
  Administered 2016-04-18 – 2016-04-19 (×3): 50 mg via ORAL
  Filled 2016-04-17 (×3): qty 1

## 2016-04-17 MED ORDER — SODIUM CHLORIDE 0.9 % IV SOLN
30.0000 meq | Freq: Once | INTRAVENOUS | Status: AC
  Administered 2016-04-17: 30 meq via INTRAVENOUS
  Filled 2016-04-17: qty 15

## 2016-04-17 MED ORDER — ALBUTEROL SULFATE (2.5 MG/3ML) 0.083% IN NEBU
2.5000 mg | INHALATION_SOLUTION | RESPIRATORY_TRACT | Status: DC | PRN
Start: 2016-04-17 — End: 2016-04-20

## 2016-04-17 MED ORDER — MOVING RIGHT ALONG BOOK
Freq: Once | Status: AC
  Administered 2016-04-17: 1
  Filled 2016-04-17: qty 1

## 2016-04-17 MED ORDER — WARFARIN VIDEO
Freq: Once | Status: AC
  Administered 2016-04-18: 10:00:00

## 2016-04-17 MED ORDER — FUROSEMIDE 40 MG PO TABS
40.0000 mg | ORAL_TABLET | Freq: Two times a day (BID) | ORAL | Status: DC
  Administered 2016-04-17 – 2016-04-20 (×8): 40 mg via ORAL
  Filled 2016-04-17 (×8): qty 1

## 2016-04-17 MED ORDER — LOSARTAN POTASSIUM 25 MG PO TABS
25.0000 mg | ORAL_TABLET | Freq: Every day | ORAL | Status: DC
  Administered 2016-04-17 – 2016-04-20 (×4): 25 mg via ORAL
  Filled 2016-04-17 (×4): qty 1

## 2016-04-17 MED ORDER — ASPIRIN EC 81 MG PO TBEC
81.0000 mg | DELAYED_RELEASE_TABLET | Freq: Every day | ORAL | Status: DC
  Administered 2016-04-17 – 2016-04-20 (×4): 81 mg via ORAL
  Filled 2016-04-17 (×4): qty 1

## 2016-04-17 MED ORDER — COUMADIN BOOK
Freq: Once | Status: AC
  Administered 2016-04-17: 13:00:00
  Filled 2016-04-17: qty 1

## 2016-04-17 MED ORDER — SODIUM CHLORIDE 0.9 % IV SOLN: 250.0000 mL | INTRAVENOUS | Status: DC | PRN

## 2016-04-17 MED ORDER — SODIUM CHLORIDE 0.9% FLUSH: 3.0000 mL | INTRAVENOUS | Status: DC | PRN

## 2016-04-17 MED ORDER — POTASSIUM CHLORIDE CRYS ER 10 MEQ PO TBCR
20.0000 meq | EXTENDED_RELEASE_TABLET | Freq: Two times a day (BID) | ORAL | Status: DC
  Administered 2016-04-17 (×2): 20 meq via ORAL
  Filled 2016-04-17 (×2): qty 2

## 2016-04-17 MED ORDER — GABAPENTIN 600 MG PO TABS
600.0000 mg | ORAL_TABLET | Freq: Three times a day (TID) | ORAL | Status: DC
  Administered 2016-04-17 – 2016-04-20 (×11): 600 mg via ORAL
  Filled 2016-04-17 (×11): qty 1

## 2016-04-17 MED ORDER — SODIUM CHLORIDE 0.9% FLUSH
3.0000 mL | Freq: Two times a day (BID) | INTRAVENOUS | Status: DC
  Administered 2016-04-17 – 2016-04-18 (×2): 3 mL via INTRAVENOUS

## 2016-04-17 NOTE — Progress Notes (Signed)
DC EPW per MD order and protocol, wires intact, no bleeding, Q15 vitals for 1 hour, VS for 1 hour.  Hermina Barters, RN

## 2016-04-17 NOTE — Progress Notes (Signed)
Physical Therapy Treatment Patient Details Name: Allison [name] MRN: [id number] DOB: 07/07/1950 Today's Date: 04/17/2016    History of Present Illness [age] year old moderately obese female with history of hypertension, atrial fibrillation, nonischemic cardiomyopathy, chronic combined systolic and diastolic congestive heart failure, mitral regurgitation, atrial fibrillation, and bilateral deafness who is now S/p Bentall, Mitral and Tricuspid repai    PT Comments    Patient was pleasant upon arrival, and willing to ambulate. Sign language interpreter was present for session.  Patient reported mild fatigue and shortness of breath towards the end of ambulation, but no increased pain. Patient tolerated exercises well, and understands and implements her sternal precautions after sternal education. Will continue to follow.    BP pre-ambulation: 127/63 BP post-ambulation: 132/64 HR during session: 81-87  O2 sat room air: 97%   Follow Up Recommendations  Home health PT;Supervision/Assistance - 24 hour     Equipment Recommendations  Rolling walker with 5" wheels    Recommendations for Other Services       Precautions / Restrictions Precautions Precautions: Sternal;Fall Restrictions Weight Bearing Restrictions: Yes (sternal)    Mobility  Bed Mobility Overal bed mobility: Needs Assistance Bed Mobility: Rolling;Sidelying to Sit Rolling: Min guard Sidelying to sit: Min assist       General bed mobility comments: visual and tactile cues for sequence to roll and sit  Transfers Overall transfer level: Needs assistance   Transfers: Sit to/from Stand Sit to Stand: Min guard         General transfer comment: visual and tactile cues with interpreter present for instruction of hands on thighs to stand from bed, [facility] and [facility]  Ambulation/Gait Ambulation/Gait assistance: Min assist Ambulation Distance (Feet): 300 Feet Assistive device: Rolling walker (2  wheeled) Gait Pattern/deviations: Step-through pattern;Decreased stride length;Trunk flexed   Gait velocity interpretation: Below normal speed for age/gender General Gait Details: cues for posture, position in RW and safety with 1 LOB with min assist to recover on initiation of gait   Stairs            Wheelchair Mobility    Modified Rankin (Stroke Patients Only)       Balance Overall balance assessment: Needs assistance Sitting-balance support: Feet supported Sitting balance-Leahy Scale: Good Sitting balance - Comments: sat EOB without assistance   Standing balance support: Bilateral upper extremity supported Standing balance-Leahy Scale: Fair Standing balance comment: used RW for upper extremity support                     Cognition Arousal/Alertness: Awake/alert Behavior During Therapy: WFL for tasks assessed/performed Overall Cognitive Status: Within Functional Limits for tasks assessed                 General Comments: Interpreter present in room throughout    Exercises General Exercises - Lower Extremity Long Arc Quad: AROM;Both;Seated;15 reps Hip Flexion/Marching: AROM;Both;Seated;15 reps Toe Raises: AROM;Both;Seated;15 reps    General Comments        Pertinent Vitals/Pain Pain Assessment: 0-10 Pain Score: 5  Pain Location: incision Pain Descriptors / Indicators: Sore;Operative site guarding;Grimacing Pain Intervention(s): Limited activity within patient's tolerance    Home Living Family/patient expects to be discharged to:: Private residence Living Arrangements: Spouse/significant other Available Help at Discharge: Family Type of Home: House Home Access: Stairs to enter Entrance Stairs-Rails: None Home Layout: One level Home Equipment: Environmental consultantWalker - 2 wheels;Cane - single point;Shower seat Additional Comments: interpreter asking home setup, equipment belongs to spouse  Prior Function Level of Independence: Independent           PT Goals (current goals can now be found in the care plan section) Progress towards PT goals: Progressing toward goals    Frequency    Min 3X/week      PT Plan Current plan remains appropriate;Frequency needs to be updated    Co-evaluation             End of Session Equipment Utilized During Treatment: Gait belt Activity Tolerance: Patient tolerated treatment well Patient left: in chair;with call bell/phone within reach     Time: 1022-1107 PT Time Calculation (min) (ACUTE ONLY): 45 min  Charges:  $Gait Training: 8-22 mins $Therapeutic Exercise: 8-22 mins $Therapeutic Activity: 8-22 mins                    G Codes:      Farrel Guimond April 28, 2016, 11:22 AM Ayana Imhof SPT [phone]

## 2016-04-17 NOTE — Progress Notes (Signed)
      [address]       [name] [address]             [phone]        CARDIOTHORACIC SURGERY PROGRESS NOTE   R4 Days Post-Op Procedure(s) (LRB): AORTIC VALVE REPLACEMENT (AVR) implanted with Magna Ease size 50mm (N/A) MAZE (N/A) TRANSESOPHAGEAL ECHOCARDIOGRAM (TEE) (N/A) PATENT FORAMEN OVALE (PFO) CLOSURE (N/A) MITRAL VALVE REPAIR (MVR) using 29mm Sorin /MEMO 3D annuloplasty ring (N/A) TRICUSPID VALVE REPAIR using a T26 MC3 Edwards annuloplasty ring (N/A)  Subjective: Looks good.  Mild soreness chest.  Already ambulated around SICU  Objective: Vital signs: BP Readings from Last 1 Encounters:  04/17/16 110/60   Pulse Readings from Last 1 Encounters:  04/17/16 76   Resp Readings from Last 1 Encounters:  04/17/16 16   Temp Readings from Last 1 Encounters:  04/17/16 97.5 F (36.4 C) (Oral)    Hemodynamics:    Physical Exam:  Rhythm:   sinus  Breath sounds: clear  Heart sounds:  RRR w/out murmur  Incisions:  Clean and dry  Abdomen:  Soft, non-distended, non-tender  Extremities:  Warm, well-perfused, + edema   Intake/Output from previous day: 12/17 0701 - 12/18 0700 In: 1500 [P.O.:1500] Out: 2125 [Urine:2015; Chest Tube:110] Intake/Output this shift: No intake/output data recorded.  Lab Results:  CBC: Recent Labs  04/16/16 0310 04/17/16 0416  WBC 13.7* 11.0*  HGB 10.0* 10.1*  HCT 30.5* 32.0*  PLT 192 160    BMET:  Recent Labs  04/16/16 0310 04/17/16 0416  NA 134* 135  K 5.0 3.1*  CL 102 95*  CO2 22 30  GLUCOSE 97 88  BUN 10 11  CREATININE 0.84 0.90  CALCIUM 8.6* 8.8*     PT/INR:   Recent Labs  04/17/16 0416  LABPROT 17.6*  INR 1.43    CBG (last 3)   Recent Labs  04/15/16 1239 04/15/16 1634 04/16/16 0853  GLUCAP 107* 94 82    ABG    Component Value Date/Time   PHART 7.346 (L) 04/14/2016 0531   PCO2ART 35.3 04/14/2016 0531   PO2ART 79.0 (L) 04/14/2016 0531   HCO3 19.3 (L) 04/14/2016 0531   TCO2  21 04/14/2016 1641   ACIDBASEDEF 6.0 (H) 04/14/2016 0531   O2SAT 95.0 04/14/2016 0531    CXR: Stable bibasilar atelectasis and mild pulm vasc congestion  Assessment/Plan: S/P Procedure(s) (LRB): AORTIC VALVE REPLACEMENT (AVR) implanted with Magna Ease size 52mm (N/A) MAZE (N/A) TRANSESOPHAGEAL ECHOCARDIOGRAM (TEE) (N/A) PATENT FORAMEN OVALE (PFO) CLOSURE (N/A) MITRAL VALVE REPAIR (MVR) using 60mm Sorin /MEMO 3D annuloplasty ring (N/A) TRICUSPID VALVE REPAIR using a T26 MC3 Edwards annuloplasty ring (N/A)  Doing well POD4 Maintaining NSR w/ stable BP Breathing comfortably w/ O2 sats 93-96% on RA Expected post op acute blood loss anemia, mild Acute on chronic combined systolic and diastolic CHF with expected post-op volume excess, weight trending down still 2 lbs > baseline but somewhat volume-overloaded preop Hypokalemia, induced by loop diuretics   Mobilize  Diuresis  Restart ARB  Restart Carvedilol at reduced dose  Decrease ASA and continue Coumadin  Transfer step down  Begin D/C planning   Purcell Nails, MD 04/17/2016 7:23 AM

## 2016-04-17 NOTE — Care Management Note (Signed)
Case Management Note  Patient Details  Name: Allison [name] MRN: [id number] Date of Birth: 21-Jul-1950  Subjective/Objective:  Pt lives with spouse, independent PTA.  PT recommends home therapy and pt agrees.  Presented list of agencies, pt states she does not have her glasses and wants to wait until she can read each entry - left list of agencies per request.  CM offered to call pt's husband but she said "no, don't want to agitate him."  Also states that he uses a cane for ambulation.  Attempted to call 3 daughters and 1 son listed on facesheet to determine level of support pt will have upon discharge - no answer.  CM will check back after pt has an opportunity to read list.                              Expected Discharge Plan:  Home w Home Health Services  Discharge planning Services  CM Consult  Post Acute Care Choice:  Home Health Choice offered to:  Patient  Status of Service:  In process, will continue to follow  Magdalene River, RN 04/17/2016, 11:58 AM

## 2016-04-17 NOTE — Care Management Important Message (Signed)
Important Message  Patient Details  Name: Allison [name] MRN: [id number] Date of Birth: 1950/05/10   Medicare Important Message Given:  Yes    Kyla Balzarine 04/17/2016, 10:47 AM

## 2016-04-18 ENCOUNTER — Inpatient Hospital Stay (HOSPITAL_COMMUNITY): Payer: Medicare Other

## 2016-04-18 LAB — BASIC METABOLIC PANEL
Anion gap: 16 — ABNORMAL HIGH (ref 5–15)
BUN: 12 mg/dL (ref 6–20)
CALCIUM: 8.7 mg/dL — AB (ref 8.9–10.3)
CO2: 27 mmol/L (ref 22–32)
CREATININE: 0.87 mg/dL (ref 0.44–1.00)
Chloride: 93 mmol/L — ABNORMAL LOW (ref 101–111)
GFR calc Af Amer: >60 mL/min (ref >60)
GLUCOSE: 80 mg/dL (ref 65–99)
Potassium: 3.1 mmol/L — ABNORMAL LOW (ref 3.5–5.1)
Sodium: 136 mmol/L (ref 135–145)

## 2016-04-18 LAB — CBC
HEMATOCRIT: 31.5 % — AB (ref 36.0–46.0)
Hemoglobin: 10.3 g/dL — ABNORMAL LOW (ref 12.0–15.0)
MCH: 28.5 pg (ref 26.0–34.0)
MCHC: 32.7 g/dL (ref 30.0–36.0)
MCV: 87 fL (ref 78.0–100.0)
PLATELETS: 204 10*3/uL (ref 150–400)
RBC: 3.62 MIL/uL — ABNORMAL LOW (ref 3.87–5.11)
RDW: 16.6 % — AB (ref 11.5–15.5)
WBC: 8.8 10*3/uL (ref 4.0–10.5)

## 2016-04-18 LAB — PROTIME-INR
INR: 1.62
Prothrombin Time: 19.4 seconds — ABNORMAL HIGH (ref 11.4–15.2)

## 2016-04-18 MED ORDER — POTASSIUM CHLORIDE CRYS ER 20 MEQ PO TBCR
40.0000 meq | EXTENDED_RELEASE_TABLET | Freq: Three times a day (TID) | ORAL | Status: DC
  Administered 2016-04-18 – 2016-04-20 (×8): 40 meq via ORAL
  Filled 2016-04-18 (×8): qty 2

## 2016-04-18 NOTE — Care Management Note (Signed)
Case Management Note Previous CM note initiated by Magdalene River, RN 04/17/2016, 11:58 AM   Patient Details  Name: Allison [name] MRN: [id number] Date of Birth: [birth year]  Subjective/Objective:  Pt lives with spouse, independent PTA.  PT recommends home therapy and pt agrees.  Presented list of agencies, pt states she does not have her glasses and wants to wait until she can read each entry - left list of agencies per request.  CM offered to call pt's husband but she said "no, don't want to agitate him."  Also states that he uses a cane for ambulation.  Attempted to call 3 daughters and 1 son listed on facesheet to determine level of support pt will have upon discharge - no answer.  CM will check back after pt has an opportunity to read list.                               Action/Plan: Pt s/p AVR/MVR/TVR- from home with spouse- tx from ICU to 2W-    Expected Discharge Date:                  Expected Discharge Plan:  Home w Home Health Services  In-House Referral:     Discharge planning Services  CM Consult  Post Acute Care Choice:  Home Health, Durable Medical Equipment Choice offered to:  Patient  DME Arranged:  Walker rolling DME Agency:  NA  HH Arranged:  PT HH Agency:  [facility]  Status of Service:  Completed, signed off  If discussed at Long Length of Stay Meetings, dates discussed:    Discharge Disposition: home with home health   Additional Comments:  04/18/16- 1030- Tiffanie Blassingame RN, CM- f/u done with pt at bedside regarding choice of HH agency in [facility]- per pt she would like to try [facility] for services- pt states that she has a cane and RW at home- referral called to [place] with [facility] for HHPT-   Zenda Alpers [employer], RN 04/18/2016, 12:11 PM [phone]

## 2016-04-18 NOTE — Progress Notes (Addendum)
[address]       [place] [address]             [phone]      5 Days Post-Op Procedure(s) (LRB): AORTIC VALVE REPLACEMENT (AVR) implanted with Magna Ease size 51mm (N/A) MAZE (N/A) TRANSESOPHAGEAL ECHOCARDIOGRAM (TEE) (N/A) PATENT FORAMEN OVALE (PFO) CLOSURE (N/A) MITRAL VALVE REPAIR (MVR) using 49mm Sorin /MEMO 3D annuloplasty ring (N/A) TRICUSPID VALVE REPAIR using a T26 MC3 Edwards annuloplasty ring (N/A)   Subjective:  Doing pretty well.  Did have some episodes of incisional discomfort which made it hard for her to get good deep breaths.  She also states she is unsteady on her feet at times.  + ambulation  + BM  Objective: Vital signs in last 24 hours: Temp:  [97.4 F (36.3 C)-98 F (36.7 C)] 97.9 F (36.6 C) (12/19 0442) Pulse Rate:  [74-86] 82 (12/19 0442) Cardiac Rhythm: Normal sinus rhythm (12/19 0700) Resp:  [18-23] 18 (12/19 0442) BP: (111-126)/(39-80) 115/80 (12/19 0442) SpO2:  [96 %-100 %] 97 % (12/19 0442) Weight:  [195 lb 9.6 oz (88.7 kg)] 195 lb 9.6 oz (88.7 kg) (12/19 0442)  Intake/Output from previous day: 12/18 0701 - 12/19 0700 In: 505 [P.O.:240; IV Piggyback:265] Out: 2240 [Urine:2240]  General appearance: alert, cooperative and no distress Heart: regular rate and rhythm Lungs: clear to auscultation bilaterally Abdomen: soft, non-tender; bowel sounds normal; no masses,  no organomegaly Extremities: edema 1+ pitting Wound: clean and dry  Lab Results:  Recent Labs  04/17/16 0416 04/18/16 0249  WBC 11.0* 8.8  HGB 10.1* 10.3*  HCT 32.0* 31.5*  PLT 160 204   BMET:  Recent Labs  04/17/16 0416 04/18/16 0249  NA 135 136  K 3.1* 3.1*  CL 95* 93*  CO2 30 27  GLUCOSE 88 80  BUN 11 12  CREATININE 0.90 0.87  CALCIUM 8.8* 8.7*    PT/INR:  Recent Labs  04/18/16 0249  LABPROT 19.4*  INR 1.62   ABG    Component Value Date/Time   PHART 7.346 (L) 04/14/2016 0531   HCO3 19.3 (L) 04/14/2016 0531   TCO2 21  04/14/2016 1641   ACIDBASEDEF 6.0 (H) 04/14/2016 0531   O2SAT 95.0 04/14/2016 0531   CBG (last 3)   Recent Labs  04/15/16 1239 04/15/16 1634 04/16/16 0853  GLUCAP 107* 94 82    Assessment/Plan: S/P Procedure(s) (LRB): AORTIC VALVE REPLACEMENT (AVR) implanted with Magna Ease size 24mm (N/A) MAZE (N/A) TRANSESOPHAGEAL ECHOCARDIOGRAM (TEE) (N/A) PATENT FORAMEN OVALE (PFO) CLOSURE (N/A) MITRAL VALVE REPAIR (MVR) using 33mm Sorin /MEMO 3D annuloplasty ring (N/A) TRICUSPID VALVE REPAIR using a T26 MC3 Edwards annuloplasty ring (N/A)  1. CV- NSR, BP controlled-continue Amiodarone, Coreg, Cozaar  2. Pulm- no acute issues, continue IS 3. INR 1.62, continue coumadin at 2.5 mg daily 4. Renal- creatinine WNL, weight is below admission, + pitting edema on exam, continue Lasix 5. Hypokalemic at 3.1- will supplement K 6. Deconditioning- mild, PT eval recs H/H PT- orders placed 7. Dispo- patient doing well, hemodynamically stable, continue coumadin at current regimen, may be ready for discharge in next 24-48 hours   LOS: 5 days    BARRETT, ERIN 04/18/2016   I have seen and examined the patient and agree with the assessment and plan as outlined.  Making good progress.  Patient will likely need short term placement in SNF for rehab unless patient's children can provide 24 hr/day care.  Her husband is not capable.  Patient has looked into [facility] and likes it.  Purcell Nailslarence H Owen, MD 04/18/2016 10:41 AM

## 2016-04-18 NOTE — Progress Notes (Signed)
CARDIAC REHAB PHASE I   PRE:  Rate/Rhythm: 74 SR  BP:  Sitting: 115/69        SaO2: 96 RA  MODE:  Ambulation: 300 ft   POST:  Rate/Rhythm: 87 SR  BP:  Sitting: 106/92         SaO2: 100 RA  Pt required min assist to stand, states she has not walked today, agreeable now. Pt ambulated 300 ft on RA, rolling walker, assist x1, mostly steady gait, tolerated fairly well. Pt c/o sternal pain, feeling like her "heart is jumping," and mild DOE, denies any other complaints, brief standing rest x2. Pt to edge of bed after walk, RN at bedside to administer pain medication. Will follow up tomorrow.   [phone] Joylene Grapes, RN, BSN 04/18/2016 3:18 PM

## 2016-04-18 NOTE — Discharge Summary (Signed)
Physician Discharge Summary  Patient ID: [name] MRN: [id number] DOB/AGE: November 27, 1950 65 y.o.  Admit date: 04/13/2016 Discharge date: 04/20/2016  Admission Diagnoses:  Patient Active Problem List   Diagnosis Date Noted  . Tricuspid regurgitation   . Mitral regurgitation   . Deafness   . Aortic insufficiency   . Chronic combined systolic and diastolic CHF (congestive heart failure) (HCC)   . Non-ischemic cardiomyopathy (HCC)   . Anticoagulated 01/20/2016  . Persistent atrial fibrillation (HCC)   . Hypothyroidism 09/17/2014  . Dyspnea 11/19/2013  . Edema 10/01/2013  . A-fib (HCC) 08/28/2013  . Dysphagia 08/28/2013  . Arthritis of right shoulder region 07/03/2012   Discharge Diagnoses:   Patient Active Problem List   Diagnosis Date Noted  . 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 S/P aortic valve replacement with bioprosthetic valve 04/13/2016  . S/P tricuspid valve repair 04/13/2016  . S/P Maze operation for atrial fibrillation 04/13/2016  . Tricuspid regurgitation   . Mitral regurgitation   . Deafness   . Aortic insufficiency   . Chronic combined systolic and diastolic CHF (congestive heart failure) (HCC)   . Non-ischemic cardiomyopathy (HCC)   . Anticoagulated 01/20/2016  . Persistent atrial fibrillation (HCC)   . Hypothyroidism 09/17/2014  . Dyspnea 11/19/2013  . Edema 10/01/2013  . A-fib (HCC) 08/28/2013  . Dysphagia 08/28/2013  . Arthritis of right shoulder region 07/03/2012   Discharged Condition: good  History of Present Illness:  [name] 65 year old moderately obese female with history of hypertension, atrial fibrillation, nonischemic cardiomyopathy, chronic combined systolic and diastolic congestive heart failure, mitral regurgitation, atrial fibrillation, and bilateral deafness who has been referred for surgical consultation to discuss treatment options for management of severe  mitral regurgitation and persistent atrial fibrillation. The patient's cardiac history dates back within 5 years ago when she first presented with paroxysmal atrial fibrillation that occurred in the setting of hyperthyroidism. She was found to have a thyroid nodule and underwent partial thyroidectomy. She has been followed intermittently ever since by Dr. Eldridge Dace. In June 2015 the patient presented with shortness of breath. CT scan performed to rule out pulmonary embolus demonstrated pulmonary edema. Echocardiogram performed at that time revealed normal left ventricular systolic function with ejection fraction estimated 55-60%. There was mild aortic insufficiency and trivial mitral regurgitation. There was mild left atrial enlargement. She was started on diuretics with some symptomatic improvement. She returnedfor follow-up in May 2016 and was found to be in atrial fibrillation. She was started on Eliquis and underwent successful cardioversion one month later but quickly went back into atrial fibrillation. She was referred to the [facility]rial Fibrillation Clinic andstarted on Flecainide but developed abdominal pain. She was switched to Propafenone and underwent successful cardioversion, but Propafenone was later stopped due to side effects. She was not felt to be a good candidate for Multaq. She return to see Dr. Angus Seller follow-up in September 2017 and complained of worsening exertional shortness of breath, orthopnea, palpitations, and heart racing. She was noted to be back in atrial fibrillation with rapid ventricular response. She underwent follow-up transthoracic echocardiogram on 02/01/2016 that revealed significant drop in left ventricular systolic function with ejection fraction estimated at only 20-25%. There was severe diffuse hypokinesis with systolic dysfunction could any. There was moderate to severe mitral regurgitation and mild to moderate aortic insufficiency. There was mild to moderate  tricuspid regurgitation. The patient subsequently underwent TEE and diagnostic cardiac catheterization by Dr. Shirlee Latch on  02/18/2016. TEE confirmed the presence of significant left ventricular systolic dysfunction with ejection fraction estimated at only 40% in the setting of severe mitral regurgitation. There appeared to be normal leaflet mobility the mitral valve but a broad central jet of severe mitral regurgitation. There was flow reversal in the pulmonary veins. There was moderate to severe left atrial enlargement. There was moderate aortic insufficiency. The aortic valve was trileaflet. There appeared to be a small patent foramen ovale but bubble study was negative. There was mild to moderate tricuspid regurgitation. Cardiac catheterization was notable for the absence of significant coronary artery disease. The patient had elevated pulmonary capillary wedge pressure with prominent V waves but pulmonary artery pressures were only slightly elevated.  She was evaluated by Dr. Cornelius Moras on 03/01/2016, at which time he was in agreement she would require surgical intervention. He felt she should undergo cardiac MRI and CT angiography.  Cardiac MRI showed moderate aoraortic insufficiency. There was severe mitral regurgitation and moderate tricuspid regurgitation. Ejection fraction was measured 45%. There was normal right ventricular size and function. CT angiography revealed no complicating vascular features.    She again presented for follow up 04/11/2016 at which time she was offered surgical intervention.  The risks and benefits of the procedure were explained to the patient and she was agreeable to proceed.   Hospital Course:   Mrs. [name] presented to [facility] on 04/13/2016.  She was taken to the operating room on 04/13/2016.  She underwent Mitral Valve Repair, Tricuspid Valve Repair, Aortic Valve Replacement, closure of PFO, and MAZE procedure.  She tolerated the procedure, was extubated and  taken to the SICU in stable condition.  During her stay in the SICU the patient was weaned extubated.  Her chest tubes and arterial lines were removed without difficulty.  She was weaned off Milrinone and Neo Synephrine drips.  She was started on Coumadin for her post Mitral Valve Repair.  She was volume overloaded and treated aggressively with IV diuretics.  She was maintaining NSR and felt medically stable for transfer to the stepdown unit in stable condition.  The patient continues to make progress.  She continues to maintain NSR and her pacing wires have been removed.  Her INR is trending up appropriately.  Her INR level is 1.77.  She will be discharged home on 2.5 mg of coumadin, with a goal INR range of 2.5-3.5.  She will need to have INR follow at [facility] coumadin clinic after discharge from SNF facility.  She has decided on SNF placement as she does not have 24/hr per day care at discharge.  She continues to respond well to diuretic therapy.  She is working with physical therapy.  She is ambulating with assistance.  Her incisions are healing well.  Her pain is well controlled.  She is felt medically stable for discharge to SNF facility today.         Significant Diagnostic Studies:   ECHO:  - Left ventricle: The cavity size was normal. Wall thickness was   normal. Systolic function was severely reduced. The estimated   ejection fraction was in the range of 20% to 25%. Severe diffuse   hypokinesis. There is profound systolic dyssynchrony, but no   clear cut coronary-distribution regional wall motion   abnormalities. - Ventricular septum: Septal motion showed paradox. These changes   are consistent with a left bundle branch block. - Aortic valve: There was mild to moderate regurgitation directed   centrally in the LVOT. - Mitral  valve: There was moderate to severe regurgitation directed   centrally. - Left atrium: The atrium was mildly dilated. - Tricuspid valve: There was mild-moderate  regurgitation  CARDIAC MRI:  1. Technically difficult study due to atrial fibrillation and free-breathing sequences.  2.  Normal LV size with mild diffuse hypokinesis, EF 45%.  3.  Trileaflet aortic valve, at least moderate aortic insufficiency.  4.  Severe central mitral regurgitation.  5.  Moderate tricuspid regurgitation.  6.  Normal RV size and systolic function.  7. No myocardial LGE, so no definitive evidence for prior MI, infiltrative disease, or myocarditis.  Treatments: surgery:   Procedure:        Mitral Valve Repair             Sorin Memo 3D ring annuloplasty (size 26mm, catalog #[id number], serial #[id number])   Aortic Valve Replacement             Edwards Magna Ease Pericardial Tissue Valve (size 23 mm, model # 3300TFX, serial # [id number])   Tricuspid Valve Repair             Sorin Memo 3D ring annuloplasty (size 26mm, model #4900, serial #[id number])   Closure of Patent Foramen Ovale               Maze Procedure             Complete bilateral atrial lesion set using cryothermy and bipolar radiofrequency ablation             Clipping of left atrial appendage (Atricure Pro245 clip, size 45 mm)  Disposition: 01-Home or Self Care   Discharge Medications:  The patient has been discharged on:   1.Beta Blocker:  Yes [ x  ]                              No   [   ]                              If No, reason:  2.Ace Inhibitor/ARB: Yes [ x  ]                                     No  [    ]                                     If No, reason:  3.Statin:   Yes [   ]                  No  [ x  ]                  If No, reason: No CAD  4.Marlowe KaysEcasa:  Yes  [ x  ]                  No   [   ]                  If No, reason:   Allergies as of 04/20/2016      Reactions   Biaxin [clarithromycin] Nausea Only, Other (See Comments)   Dizzy, blurred vision, achy stomach   Levaquin [levofloxacin] Nausea Only, Other (See Comments)  Dizzy, achy stomach, can't sleep    Trazodone And Nefazodone Other (See Comments)   Weakness in legs Numbness in arms Funny feeling Rapid heart beat Lose focus   Sulfa Antibiotics Other (See Comments)   Blister on large toe   Aleve [naproxen] Nausea And Vomiting, Other (See Comments)   Stomach ache   Bextra [valdecoxib] Nausea And Vomiting, Other (See Comments)   Stomach ache   Doxycycline Nausea Only, Other (See Comments)   Stomach ache   Durabac [apap-salicyl-phenyltolox-caff] Nausea And Vomiting   Estradiol Nausea Only, Other (See Comments)   Hurt stomach   Macrobid [nitrofurantoin] Other (See Comments)   BLOATING   Motrin [ibuprofen] Nausea Only, Other (See Comments)   Stomach ahce   Oxaprozin Nausea Only, Other (See Comments)   Hurt stomach   Robaxin [methocarbamol] Nausea Only, Other (See Comments)   Hurt stomach   Topamax [topiramate] Nausea And Vomiting      Medication List    STOP taking these medications   apixaban 5 MG Tabs tablet Commonly known as:  ELIQUIS   cefdinir 300 MG capsule Commonly known as:  OMNICEF   NOREL AD 4-10-325 MG Tabs Generic drug:  Chlorphen-PE-Acetaminophen   potassium chloride 8 MEQ tablet Commonly known as:  KLOR-CON Replaced by:  potassium chloride SA 20 MEQ tablet     TAKE these medications   ALPRAZolam 0.25 MG tablet Commonly known as:  XANAX Take 1 tablet (0.25 mg total) by mouth 2 (two) times daily as needed for anxiety.   amiodarone 200 MG tablet Commonly known as:  PACERONE Take 1 tablet (200 mg total) by mouth 2 (two) times daily after a meal. For 7 days, then decrease to once a day   aspirin 81 MG EC tablet Take 1 tablet (81 mg total) by mouth daily.   CALTRATE 600+D 600-400 MG-UNIT tablet Generic drug:  Calcium Carbonate-Vitamin D Take 1 tablet by mouth 2 (two) times daily.   carvedilol 3.125 MG tablet Commonly known as:  COREG Take 1 tablet (3.125 mg total) by mouth 2 (two) times daily with a meal. What changed:  medication strength  how  much to take  when to take this   diphenhydrAMINE 25 mg capsule Commonly known as:  BENADRYL Take 25 mg by mouth daily as needed for itching.   furosemide 40 MG tablet Commonly known as:  LASIX Take 1 tablet (40 mg total) by mouth daily. For 7 days, then resume 20 mg daily, which was home regimen What changed:  medication strength  how much to take  additional instructions   gabapentin 600 MG tablet Commonly known as:  NEURONTIN Take 600 mg by mouth 3 (three) times daily.   levothyroxine 125 MCG tablet Commonly known as:  SYNTHROID, LEVOTHROID Take 125 mcg by mouth daily before breakfast.   LORazepam 2 MG tablet Commonly known as:  ATIVAN Take 1 tablet (2 mg total) by mouth at bedtime. For restful sleep   losartan 25 MG tablet Commonly known as:  COZAAR Take 1 tablet (25 mg total) by mouth daily.   montelukast 10 MG tablet Commonly known as:  SINGULAIR Take 10 mg by mouth daily before breakfast.   omeprazole 40 MG capsule Commonly known as:  PRILOSEC Take 40 mg by mouth daily.   oxyCODONE 5 MG immediate release tablet Commonly known as:  Oxy IR/ROXICODONE Take 1-2 tablets (5-10 mg total) by mouth every 3 (three) hours as needed for severe pain.   potassium chloride SA 20 MEQ tablet Commonly  known as:  K-DUR,KLOR-CON Take 2 tablets (40 mEq total) by mouth daily. Replaces:  potassium chloride 8 MEQ tablet   promethazine 25 MG tablet Commonly known as:  PHENERGAN Take 25 mg by mouth daily as needed for nausea or vomiting.   tiZANidine 2 MG tablet Commonly known as:  ZANAFLEX Take 2 mg by mouth 2 (two) times daily as needed for muscle spasms.   traMADol 50 MG tablet Commonly known as:  ULTRAM Take 50 mg by mouth every 6 (six) hours as needed for moderate pain.   Vitamin B-12 5000 MCG Subl Place 1 tablet under the tongue. For nerve pain in feet and metabolism support   warfarin 2.5 MG tablet Commonly known as:  COUMADIN Take 1 tablet (2.5 mg total) by  mouth daily at 6 PM.            Durable Medical Equipment        Start     Ordered   04/18/16 [phone]  For home use only DME Walker rolling  Once    Question:  Patient needs a walker to treat with the following condition  Answer:  Physical deconditioning   04/18/16 1191     Follow-up Information    Purcell Nails, MD Follow up on 05/15/2016.   Specialty:  Cardiothoracic Surgery Why:  Appointment is at 1:00, please get CXR at 12:30 at [facility] imaging located on first floor of our office building Contact information: [address] [employer] [address] [place] [address] [phone]        [facility] Follow up on 04/21/2016.   Specialty:  Cardiology Why:  Please set up follow up appointment for PT/INR draw 48 hours after discharge from SNF facility  Contact information: [address], [address] [place] [address] [phone]       [facility] Follow up.   Why:  HHPT arranged- they will call you to set up home visits Contact information: [address] [place] [address] [phone]        Tereso Newcomer, PA-C Follow up on 05/08/2016.   Specialties:  Cardiology, Physician Assistant Why:  Appointment is at 8:15 Contact information: [address] [place] [address] [phone]           Signed: Lowella Dandy 04/20/2016, 8:21 AM

## 2016-04-19 ENCOUNTER — Encounter (HOSPITAL_COMMUNITY): Payer: Self-pay | Admitting: General Practice

## 2016-04-19 LAB — PROTIME-INR
INR: 1.71
Prothrombin Time: 20.3 seconds — ABNORMAL HIGH (ref 11.4–15.2)

## 2016-04-19 MED FILL — Heparin Sodium (Porcine) Inj 1000 Unit/ML: INTRAMUSCULAR | Qty: 2500 | Status: AC

## 2016-04-19 NOTE — [place] FL2 (Signed)
[place] MEDICAID FL2 LEVEL OF CARE SCREENING TOOL     IDENTIFICATION  Patient Name: Allison [name] Birthdate: 04-18-1951 Sex: female Admission Date (Current Location): 04/13/2016  [facility] and [place] Number:  Producer, television/film/video and Address:  [facility] [place], [address], [place], [place] [address]      Provider Number: [id number]  Attending Physician Name and Address:  Purcell Nails, MD  Relative Name and Phone Number:  Claybon Jabs, [phone]    Current Level of Care: Hospital Recommended Level of Care: Skilled Nursing Facility Prior Approval Number:    Date Approved/Denied:   PASRR Number: [id number]  Discharge Plan: SNF    Current Diagnoses: Patient Active Problem List   Diagnosis Date Noted  . 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 S/P aortic valve replacement with bioprosthetic valve 04/13/2016  . S/P tricuspid valve repair 04/13/2016  . S/P Maze operation for atrial fibrillation 04/13/2016  . Tricuspid regurgitation   . Mitral regurgitation   . Deafness   . Aortic insufficiency   . Chronic combined systolic and diastolic CHF (congestive heart failure) (HCC)   . Non-ischemic cardiomyopathy (HCC)   . Anticoagulated 01/20/2016  . Persistent atrial fibrillation (HCC)   . Hypothyroidism 09/17/2014  . Dyspnea 11/19/2013  . Edema 10/01/2013  . A-fib (HCC) 08/28/2013  . Dysphagia 08/28/2013  . Arthritis of right shoulder region 07/03/2012    Orientation RESPIRATION BLADDER Height & Weight     Time, Situation, Place, Self  Normal Continent Weight: 194 lb 12.8 oz (88.4 kg) Height:  5\' 7"  (170.2 cm)  BEHAVIORAL SYMPTOMS/MOOD NEUROLOGICAL BOWEL NUTRITION STATUS      Continent Diet (Heart Healthy)  AMBULATORY STATUS COMMUNICATION OF NEEDS Skin   Limited Assist Verbally Normal                       Personal Care Assistance Level of Assistance   Bathing, Feeding, Dressing Bathing Assistance: Limited assistance Feeding assistance: Limited assistance Dressing Assistance: Limited assistance     Functional Limitations Info  Sight, Hearing, Speech Sight Info: Adequate Hearing Info: Impaired (patient is deaf, can read lips well) Speech Info: Adequate    SPECIAL CARE FACTORS FREQUENCY  PT (By licensed PT), OT (By licensed OT)     PT Frequency: 5x week OT Frequency: 5x week            Contractures Contractures Info: Not present    Additional Factors Info  Code Status, Allergies Code Status Info: Full Code Allergies Info: BIAXIN CLARITHROMYCIN, LEVAQUIN LEVOFLOXACIN, TRAZODONE AND NEFAZODONE, SULFA ANTIBIOTICS, ALEVE NAPROXEN, BEXTRA VALDECOXIB, DOXYCYCLINE, DURABAC APAP-SALICYL-PHENYLTOLOX-CAFF, ESTRADIOL, MACROBID NITROFURANTOIN, MOTRIN IBUPROFEN, OXAPROZIN, ROBAXIN METHOCARBAMOL, TOPAMAX TOPIRAMATE            Current Medications (04/19/2016):  This is the current hospital active medication list Current Facility-Administered Medications  Medication Dose Route Frequency Provider Last Rate Last Dose  . 0.9 %  sodium chloride infusion  250 mL Intravenous Continuous Purcell Nails, MD      . 0.9 %  sodium chloride infusion  250 mL Intravenous PRN Purcell Nails, MD      . albuterol (PROVENTIL) (2.5 MG/3ML) 0.083% nebulizer solution 2.5 mg  2.5 mg Nebulization Q4H PRN Purcell Nails, MD      . ALPRAZolam Prudy Feeler) tablet 0.25 mg  0.25 mg Oral BID PRN Purcell Nails, MD      . amiodarone (  PACERONE) tablet 200 mg  200 mg Oral BID PC Purcell Nailslarence H Owen, MD   200 mg at 04/19/16 1742  . aspirin EC tablet 81 mg  81 mg Oral Daily Purcell Nailslarence H Owen, MD   81 mg at 04/19/16 1051  . bisacodyl (DULCOLAX) EC tablet 10 mg  10 mg Oral Daily Purcell Nailslarence H Owen, MD   10 mg at 04/19/16 1051   Or  . bisacodyl (DULCOLAX) suppository 10 mg  10 mg Rectal Daily Purcell Nailslarence H Owen, MD      . carvedilol (COREG) tablet 3.125 mg  3.125 mg Oral BID WC  Purcell Nailslarence H Owen, MD   3.125 mg at 04/19/16 1742  . docusate sodium (COLACE) capsule 200 mg  200 mg Oral Daily Purcell Nailslarence H Owen, MD   200 mg at 04/19/16 1051  . furosemide (LASIX) tablet 40 mg  40 mg Oral BID Purcell Nailslarence H Owen, MD   40 mg at 04/19/16 1742  . gabapentin (NEURONTIN) tablet 600 mg  600 mg Oral TID Purcell Nailslarence H Owen, MD   600 mg at 04/19/16 1741  . levothyroxine (SYNTHROID, LEVOTHROID) tablet 125 mcg  125 mcg Oral QAC breakfast Purcell Nailslarence H Owen, MD   125 mcg at 04/19/16 1051  . losartan (COZAAR) tablet 25 mg  25 mg Oral Daily Purcell Nailslarence H Owen, MD   25 mg at 04/19/16 1051  . metoprolol (LOPRESSOR) injection 2.5-5 mg  2.5-5 mg Intravenous Q2H PRN Purcell Nailslarence H Owen, MD      . montelukast (SINGULAIR) tablet 10 mg  10 mg Oral QAC breakfast Purcell Nailslarence H Owen, MD   10 mg at 04/19/16 1052  . ondansetron (ZOFRAN) injection 4 mg  4 mg Intravenous Q6H PRN Purcell Nailslarence H Owen, MD   4 mg at 04/15/16 2325  . oxyCODONE (Oxy IR/ROXICODONE) immediate release tablet 5-10 mg  5-10 mg Oral Q3H PRN Purcell Nailslarence H Owen, MD   10 mg at 04/19/16 1416  . pantoprazole (PROTONIX) EC tablet 40 mg  40 mg Oral Daily Purcell Nailslarence H Owen, MD   40 mg at 04/19/16 1050  . potassium chloride SA (K-DUR,KLOR-CON) CR tablet 40 mEq  40 mEq Oral TID Purcell Nailslarence H Owen, MD   40 mEq at 04/19/16 1741  . sodium chloride flush (NS) 0.9 % injection 3 mL  3 mL Intravenous Q12H Purcell Nailslarence H Owen, MD   3 mL at 04/19/16 1052  . sodium chloride flush (NS) 0.9 % injection 3 mL  3 mL Intravenous PRN Purcell Nailslarence H Owen, MD      . sodium chloride flush (NS) 0.9 % injection 3 mL  3 mL Intravenous Q12H Purcell Nailslarence H Owen, MD   3 mL at 04/18/16 2207  . sodium chloride flush (NS) 0.9 % injection 3 mL  3 mL Intravenous PRN Purcell Nailslarence H Owen, MD      . traMADol Janean Sark(ULTRAM) tablet 50 mg  50 mg Oral Q6H PRN Purcell Nailslarence H Owen, MD   50 mg at 04/19/16 1742  . warfarin (COUMADIN) tablet 2.5 mg  2.5 mg Oral q1800 Purcell Nailslarence H Owen, MD   2.5 mg at 04/19/16 1742  . Warfarin - Physician Dosing  Inpatient   Does not apply [id number] Purcell Nailslarence H Owen, MD   1 each at 04/16/16 1800     Discharge Medications: Please see discharge summary for a list of discharge medications.  Relevant Imaging Results:  Relevant Lab Results:   Additional Information SSN: [id number]  Althea CharonAshley C Tekeshia Klahr, LCSW

## 2016-04-19 NOTE — Clinical Social Work Note (Signed)
Clinical Social Work Assessment  Patient Details  Name: Allison [name] MRN: [id number] Date of Birth: 04/24/1951  Date of referral:  04/19/16               Reason for consult:  Discharge Planning                Permission sought to share information with:  Family Supports Permission granted to share information::  Yes, Verbal Permission Granted  Name::     Crystal Powell  Agency::     Relationship::  daughter  Contact Information:  [phone]  Housing/Transportation Living arrangements for the past 2 months:  Skilled Nursing Facility Source of Information:  Patient Patient Interpreter Needed:  None Criminal Activity/Legal Involvement Pertinent to Current Situation/Hospitalization:  No - Comment as needed Significant Relationships:  Spouse, Adult Children Lives with:  Spouse Do you feel safe going back to the place where you live?  No Need for family participation in patient care:  No (Coment)  Care giving concerns: No family/friends at bedside. Patient stated that she has 4 children and several grandchildren    Social Worker assessment / plan: Clinical Social Worker met pateint at bedside to discuss needs at discharge. Patient stated she wants to go to a SNF because her husband will be unable to help her once she is home. Patient states she has 4 children that live in the [place] and [place] area. Patients daughter Amy walked into room during assessment and stated she would like patient be placed in a SNF in [place] or [place] area because it would be easier on the family. Patient agreeable to SNF placement but would prefer private room. CSW to follow up with patient once bed offers are available. CSW remains available for support and to facilitate patient discharge needs once medically ready.   Employment status:  Retired Insurance information:  Medicare PT Recommendations:  Skilled Nursing Facility Information / Referral to community resources:  Skilled Nursing  Facility  Patient/Family's Response to care:  Patient verbalized appreciation and understanding for CSW role and involvement in care. Patient agreeable with current discharge plan to SNF following discharge.   Patient/Family's Understanding of and Emotional Response to Diagnosis, Current Treatment, and Prognosis:  Patient with good understanding of current medical state and limitations around most recent hospitalization. Patient agreeable with SNF placement in hopes of transitioning to a more stable living environment.   Emotional Assessment Appearance:  Appears stated age Attitude/Demeanor/Rapport:  Other (attitude/demeanor appropriate) Affect (typically observed):  Pleasant, Calm, Happy Orientation:  Oriented to Self, Oriented to Place, Oriented to  Time, Oriented to Situation Alcohol / Substance use:  Not Applicable Psych involvement (Current and /or in the community):     Discharge Needs  Concerns to be addressed:  No discharge needs identified Readmission within the last 30 days:  No Current discharge risk:  None Barriers to Discharge:  No Barriers Identified    C , LCSW 04/19/2016, 5:41 PM  

## 2016-04-19 NOTE — Clinical Social Work Placement (Signed)
   CLINICAL SOCIAL WORK PLACEMENT  NOTE  Date:  04/19/2016  Patient Details  Name: Allison [name] MRN: [id number] Date of Birth: [birth year]  Clinical Social Work is seeking post-discharge placement for this patient at the Skilled  Nursing Facility level of care (*CSW will initial, date and re-position this form in  chart as items are completed):  Yes   Patient/family provided with [facility] Clinical Social Work Department's list of facilities offering this level of care within the geographic area requested by the patient (or if unable, by the patient's family).  Yes   Patient/family informed of their freedom to choose among providers that offer the needed level of care, that participate in Medicare, Medicaid or managed care program needed by the patient, have an available bed and are willing to accept the patient.  Yes   Patient/family informed of [facility]'s ownership interest in [facility] and [facility], as well as of the fact that they are under no obligation to receive care at these facilities.  PASRR submitted to EDS on 04/19/16     PASRR number received on 04/19/16     Existing PASRR number confirmed on       FL2 transmitted to all facilities in geographic area requested by pt/family on       FL2 transmitted to all facilities within larger geographic area on       Patient informed that his/her managed care company has contracts with or will negotiate with certain facilities, including the following:            Patient/family informed of bed offers received.  Patient chooses bed at       Physician recommends and patient chooses bed at      Patient to be transferred to   on  .  Patient to be transferred to facility by       Patient family notified on   of transfer.  Name of family member notified:        PHYSICIAN Please sign FL2     Additional Comment:    _______________________________________________ Althea Charon, LCSW 04/19/2016, 6:34  PM

## 2016-04-19 NOTE — Progress Notes (Signed)
CARDIAC REHAB PHASE I   PRE:  Rate/Rhythm: 81 SR    BP: to BR    SaO2: 94 RA  MODE:  Ambulation: 550 ft   POST:  Rate/Rhythm: 100 ST    BP: sitting 155/67     SaO2: 95 RA  Pt needing to go to BR upon my arrival. I assisted there, she is unsteady on her feet without the RW. Steady with RW in hall but needs cues how to use it on turns. Had x2 episodes of sudden SOB that resolved in 4-5 seconds. To recliner. Pt had several beats NSVT after sitting while she was eating. She sts she did not notice. ? Needs a Nurse, learning disability for education. [id number]  Harriet Masson CES, ACSM 04/19/2016 7:53 AM

## 2016-04-19 NOTE — Progress Notes (Addendum)
[address]       [place] [address]             [phone]      6 Days Post-Op Procedure(s) (LRB): AORTIC VALVE REPLACEMENT (AVR) implanted with Magna Ease size 76mm (N/A) MAZE (N/A) TRANSESOPHAGEAL ECHOCARDIOGRAM (TEE) (N/A) PATENT FORAMEN OVALE (PFO) CLOSURE (N/A) MITRAL VALVE REPAIR (MVR) using 19mm Sorin /MEMO 3D annuloplasty ring (N/A) TRICUSPID VALVE REPAIR using a T26 MC3 Edwards annuloplasty ring (N/A) Subjective: Feels pretty well  Objective: Vital signs in last 24 hours: Temp:  [98 F (36.7 C)-98.9 F (37.2 C)] 98 F (36.7 C) (12/20 0447) Pulse Rate:  [76-90] 90 (12/20 0447) Cardiac Rhythm: Normal sinus rhythm (12/20 0700) Resp:  [18-22] 22 (12/20 0447) BP: (125-131)/(58-74) 131/74 (12/20 0447) SpO2:  [94 %-98 %] 98 % (12/20 0447) Weight:  [194 lb 12.8 oz (88.4 kg)] 194 lb 12.8 oz (88.4 kg) (12/20 0447)  Hemodynamic parameters for last 24 hours:    Intake/Output from previous day: 12/19 0701 - 12/20 0700 In: 600 [P.O.:600] Out: 1300 [Urine:1300] Intake/Output this shift: No intake/output data recorded.  General appearance: alert, cooperative and no distress Heart: regular rate and rhythm and no murmur Lungs: min dim in bases Abdomen: bemign Extremities: minor edema Wound: oncis healing well  Lab Results:  Recent Labs  04/17/16 0416 04/18/16 0249  WBC 11.0* 8.8  HGB 10.1* 10.3*  HCT 32.0* 31.5*  PLT 160 204   BMET:  Recent Labs  04/17/16 0416 04/18/16 0249  NA 135 136  K 3.1* 3.1*  CL 95* 93*  CO2 30 27  GLUCOSE 88 80  BUN 11 12  CREATININE 0.90 0.87  CALCIUM 8.8* 8.7*    PT/INR:  Recent Labs  04/19/16 0242  LABPROT 20.3*  INR 1.71   ABG    Component Value Date/Time   PHART 7.346 (L) 04/14/2016 0531   HCO3 19.3 (L) 04/14/2016 0531   TCO2 21 04/14/2016 1641   ACIDBASEDEF 6.0 (H) 04/14/2016 0531   O2SAT 95.0 04/14/2016 0531   CBG (last 3)  No results for input(s): GLUCAP in the last 72  hours.  Meds Scheduled Meds: . amiodarone  200 mg Oral BID PC  . aspirin EC  81 mg Oral Daily  . bisacodyl  10 mg Oral Daily   Or  . bisacodyl  10 mg Rectal Daily  . carvedilol  3.125 mg Oral BID WC  . docusate sodium  200 mg Oral Daily  . furosemide  40 mg Oral BID  . gabapentin  600 mg Oral TID  . levothyroxine  125 mcg Oral QAC breakfast  . losartan  25 mg Oral Daily  . montelukast  10 mg Oral QAC breakfast  . pantoprazole  40 mg Oral Daily  . potassium chloride  40 mEq Oral TID  . sodium chloride flush  3 mL Intravenous Q12H  . sodium chloride flush  3 mL Intravenous Q12H  . warfarin  2.5 mg Oral q1800  . Warfarin - Physician Dosing Inpatient   Does not apply q1800   Continuous Infusions: . sodium chloride     PRN Meds:.sodium chloride, albuterol, ALPRAZolam, metoprolol, ondansetron (ZOFRAN) IV, oxyCODONE, sodium chloride flush, sodium chloride flush, traMADol  Xrays Dg Chest 2 View  Result Date: 04/18/2016 CLINICAL DATA:  Atelectasis. EXAM: CHEST  2 VIEW COMPARISON:  04/17/2016. FINDINGS: Prior cardiac valve replacements. Left atrial appendage clip noted. Cardiomegaly with normal pulmonary vascularity. Left apical and bibasilar pleural-parenchymal thickening and  or scarring again noted. Mild bibasilar subsegmental atelectasis . Interval near complete clearing of bibasilar infiltrates small bilateral pleural effusions. Right shoulder replacement. IMPRESSION: 1. Interval near complete clearing of bibasilar infiltrates. Mild residual bibasilar atelectasis and tiny pleural effusions are noted. 2. Prior cardiac valve replacements. Stable cardiomegaly. No pulmonary venous congestion. Electronically Signed   By: Maisie Fushomas  Register   On: 04/18/2016 08:41    Assessment/Plan: S/P Procedure(s) (LRB): AORTIC VALVE REPLACEMENT (AVR) implanted with Magna Ease size 23mm (N/A) MAZE (N/A) TRANSESOPHAGEAL ECHOCARDIOGRAM (TEE) (N/A) PATENT FORAMEN OVALE (PFO) CLOSURE (N/A) MITRAL VALVE  REPAIR (MVR) using 26mm Sorin /MEMO 3D annuloplasty ring (N/A) TRICUSPID VALVE REPAIR using a T26 MC3 Edwards annuloplasty ring (N/A)  1 conts to progress 2 sinus with occas. Vent ectopy 3 SW consult placed for SNF 4 no new labs 5 conts current dose coumadin 6 cont gentle diuresis 7 cont to push rehab/routine pulm toilet     LOS: 6 days    GOLD,WAYNE E 04/19/2016  I have seen and examined the patient and agree with the assessment and plan as outlined.  D/C planning in process.  Anticipate D/C to SNF for short term rehab in 1-2 days.  Purcell Nailslarence H Owen, MD 04/19/2016 2:34 PM

## 2016-04-20 LAB — BASIC METABOLIC PANEL
Anion gap: 13 (ref 5–15)
BUN: 13 mg/dL (ref 6–20)
CALCIUM: 9.1 mg/dL (ref 8.9–10.3)
CHLORIDE: 92 mmol/L — AB (ref 101–111)
CO2: 27 mmol/L (ref 22–32)
CREATININE: 0.92 mg/dL (ref 0.44–1.00)
GFR calc non Af Amer: >60 mL/min (ref >60)
Glucose, Bld: 93 mg/dL (ref 65–99)
Potassium: 4.8 mmol/L (ref 3.5–5.1)
Sodium: 132 mmol/L — ABNORMAL LOW (ref 135–145)

## 2016-04-20 LAB — PROTIME-INR
INR: 1.73
PROTHROMBIN TIME: 20.5 s — AB (ref 11.4–15.2)

## 2016-04-20 MED ORDER — WARFARIN SODIUM 2.5 MG PO TABS: 2.5000 mg | ORAL_TABLET | Freq: Every day | ORAL | Status: DC

## 2016-04-20 MED ORDER — LORAZEPAM 2 MG PO TABS: 2.0000 mg | ORAL_TABLET | Freq: Every day | ORAL | 0 refills | Status: DC

## 2016-04-20 MED ORDER — POTASSIUM CHLORIDE CRYS ER 20 MEQ PO TBCR: 40.0000 meq | EXTENDED_RELEASE_TABLET | Freq: Every day | ORAL | Status: DC

## 2016-04-20 MED ORDER — ALPRAZOLAM 0.25 MG PO TABS: 0.2500 mg | ORAL_TABLET | Freq: Two times a day (BID) | ORAL | 0 refills | Status: DC | PRN

## 2016-04-20 MED ORDER — CARVEDILOL 3.125 MG PO TABS: 3.1250 mg | ORAL_TABLET | Freq: Two times a day (BID) | ORAL | Status: DC

## 2016-04-20 MED ORDER — AMIODARONE HCL 200 MG PO TABS: 200.0000 mg | ORAL_TABLET | Freq: Two times a day (BID) | ORAL | Status: DC

## 2016-04-20 MED ORDER — ASPIRIN 81 MG PO TBEC: 81.0000 mg | DELAYED_RELEASE_TABLET | Freq: Every day | ORAL | Status: DC

## 2016-04-20 MED ORDER — FUROSEMIDE 40 MG PO TABS: 40.0000 mg | ORAL_TABLET | Freq: Every day | ORAL | Status: DC

## 2016-04-20 MED ORDER — OXYCODONE HCL 5 MG PO TABS: 5.0000 mg | ORAL_TABLET | ORAL | 0 refills | Status: DC | PRN

## 2016-04-20 NOTE — Care Management Important Message (Signed)
Important Message  Patient Details  Name: Allison [name] MRN: [id number] Date of Birth: 12/14/1950   Medicare Important Message Given:  Yes    Kyla Balzarine 04/20/2016, 10:47 AM

## 2016-04-20 NOTE — Progress Notes (Signed)
Physical Therapy Treatment Patient Details Name: TRIANNA MADEJA MRN: [id number] DOB: 01-Oct-1950 Today's Date: 04/20/2016    History of Present Illness 65 year old moderately obese female with history of hypertension, atrial fibrillation, nonischemic cardiomyopathy, chronic combined systolic and diastolic congestive heart failure, mitral regurgitation, atrial fibrillation, and bilateral deafness who is now S/p [place], Mitral and Tricuspid repai    PT Comments    Patient was pleasant and willing to ambulate today. Patient's discharge plan has been updated to reflect her concern with inadequate assist from husband at home and her difficulty with performing transfers. Patient is performing mobility well, but still requires some assistance and cuing with moving to standing and sitting. Will continue to follow.   HR : 81-97 SPO2 room air: 93-97% BP preambulation: 125/78 BP postambulation: 129/76   Follow Up Recommendations  SNF;Supervision for mobility/OOB (patient notes that she does not think family can provide adequate care after discharge )     Equipment Recommendations  Rolling walker with 5" wheels    Recommendations for Other Services       Precautions / Restrictions Precautions Precautions: Sternal;Fall Precaution Comments: patient able to recall 4/5 sternal precautions  Restrictions Weight Bearing Restrictions: Yes (sternal precautions)    Mobility  Bed Mobility               General bed mobility comments: in chair on arrival  Transfers Overall transfer level: Needs assistance   Transfers: Sit to/from Stand Sit to Stand: Min assist         General transfer comment: visual, tactile and verbal (lip reading) cues throughout for sequence, anterior translation and hand placement, decreased control of descent.   Ambulation/Gait Ambulation/Gait assistance: Min guard Ambulation Distance (Feet): 550 Feet Assistive device: Rolling walker (2 wheeled) Gait  Pattern/deviations: Step-through pattern;Decreased stride length   Gait velocity interpretation: Below normal speed for age/gender General Gait Details: cues for posture, position in RW and safety. attempted trial of grossly 36' without RW with increased instability of gait and pt requesting RW for comfort and stability   Stairs            Wheelchair Mobility    Modified Rankin (Stroke Patients Only)       Balance Overall balance assessment: Needs assistance   Sitting balance-Leahy Scale: Good       Standing balance-Leahy Scale: Fair                      Cognition Arousal/Alertness: Awake/alert Behavior During Therapy: WFL for tasks assessed/performed Overall Cognitive Status: Within Functional Limits for tasks assessed                      Exercises General Exercises - Lower Extremity Long Arc Quad: AROM;Both;10 reps;Seated Hip Flexion/Marching: AROM;Both;10 reps;Seated Toe Raises: AROM;Both;10 reps;Seated    General Comments        Pertinent Vitals/Pain Pain Assessment: No/denies pain    Home Living                      Prior Function            PT Goals (current goals can now be found in the care plan section) Acute Rehab PT Goals Patient Stated Goal: progress mobility  PT Goal Formulation: With patient Time For Goal Achievement: 05/04/16 Potential to Achieve Goals: Good Progress towards PT goals: Progressing toward goals    Frequency    Min 3X/week      PT Plan  Discharge plan needs to be updated    Co-evaluation             End of Session Equipment Utilized During Treatment: Gait belt Activity Tolerance: Patient tolerated treatment well Patient left: in chair;with call bell/phone within reach     Time: 0836-0916 PT Time Calculation (min) (ACUTE ONLY): 40 min  Charges:  $Gait Training: 8-22 mins $Therapeutic Exercise: 8-22 mins $Therapeutic Activity: 8-22 mins                    G Codes:       Keonda Dow 04/20/2016, 10:42 AM  Ahyana Skillin SPT [phone]

## 2016-04-20 NOTE — Progress Notes (Signed)
Clinical Social Worker facilitated patient discharge including contacting patient family and facility to confirm patient discharge plans.  Clinical information faxed to facility and family agreeable with plan.  CSW spoke with patient's daughter Aggie Cosier and Aggie Cosier stated that her sister or patients husband will pick patient up around 5-5:30pm. RN Broke to call [phone] for report prior to discharge.  Clinical Social Worker will sign off for now as social work intervention is no longer needed. Please consult [place] again if new need arises.  Marrianne Mood, MSW, [employer] [phone]

## 2016-04-20 NOTE — Progress Notes (Addendum)
      [address]       [place] [address]             [phone]      7 Days Post-Op Procedure(s) (LRB): AORTIC VALVE REPLACEMENT (AVR) implanted with Magna Ease size 58mm (N/A) MAZE (N/A) TRANSESOPHAGEAL ECHOCARDIOGRAM (TEE) (N/A) PATENT FORAMEN OVALE (PFO) CLOSURE (N/A) MITRAL VALVE REPAIR (MVR) using 43mm Sorin /MEMO 3D annuloplasty ring (N/A) TRICUSPID VALVE REPAIR using a T26 MC3 Edwards annuloplasty ring (N/A)   Subjective:  Allison [name] has no complaints this morning.  She has decided to go to SNF since her husband and children are unable to provide care at discharge.  Objective: Vital signs in last 24 hours: Temp:  [97.7 F (36.5 C)-98 F (36.7 C)] 97.7 F (36.5 C) (12/21 0555) Pulse Rate:  [81-93] 92 (12/21 0747) Cardiac Rhythm: Normal sinus rhythm (12/20 1926) Resp:  [20-22] 22 (12/21 0555) BP: (94-135)/(57-69) 135/69 (12/21 0555) SpO2:  [92 %-99 %] 99 % (12/21 0555) Weight:  [193 lb 8 oz (87.8 kg)] 193 lb 8 oz (87.8 kg) (12/21 0555)  Intake/Output from previous day: 12/20 0701 - 12/21 0700 In: -  Out: 300 [Urine:300]  General appearance: alert, cooperative and no distress Heart: regular rate and rhythm Lungs: clear to auscultation bilaterally Abdomen: soft, non-tender; bowel sounds normal; no masses,  no organomegaly Extremities: edema improving Wound: clean and dry  Lab Results:  Recent Labs  04/18/16 0249  WBC 8.8  HGB 10.3*  HCT 31.5*  PLT 204   BMET:  Recent Labs  04/18/16 0249 04/20/16 0303  NA 136 132*  K 3.1* 4.8  CL 93* 92*  CO2 27 27  GLUCOSE 80 93  BUN 12 13  CREATININE 0.87 0.92  CALCIUM 8.7* 9.1    PT/INR:  Recent Labs  04/20/16 0303  LABPROT 20.5*  INR 1.73   ABG    Component Value Date/Time   PHART 7.346 (L) 04/14/2016 0531   HCO3 19.3 (L) 04/14/2016 0531   TCO2 21 04/14/2016 1641   ACIDBASEDEF 6.0 (H) 04/14/2016 0531   O2SAT 95.0 04/14/2016 0531   CBG (last 3)  No results for input(s):  GLUCAP in the last 72 hours.  Assessment/Plan: S/P Procedure(s) (LRB): AORTIC VALVE REPLACEMENT (AVR) implanted with Magna Ease size 68mm (N/A) MAZE (N/A) TRANSESOPHAGEAL ECHOCARDIOGRAM (TEE) (N/A) PATENT FORAMEN OVALE (PFO) CLOSURE (N/A) MITRAL VALVE REPAIR (MVR) using 11mm Sorin /MEMO 3D annuloplasty ring (N/A) TRICUSPID VALVE REPAIR using a T26 MC3 Edwards annuloplasty ring (N/A)  1. CV- NSR, occasional PVCs hemodynamically stable- continue Amiodarone, Cozaar, Coreg 2. Pulm- no acute issues, continue IS at discharge 3. Renal- creatinine has been stable, weight is improving, will continue lasix at discharge 4. Deconditioning- SNF at discharge, as patient has no help at home  5. Dispo- patient stable, will d/c to SNF today if bed available   LOS: 7 days    Allison [name], Allison [name] 04/20/2016  I have seen and examined the patient and agree with the assessment and plan as outlined.  Purcell Nails, MD 04/20/2016 2:57 PM

## 2016-04-20 NOTE — Care Management Note (Signed)
Case Management Note Previous CM note initiated by Allison River, RN 04/17/2016, 11:58 AM   Patient Details  Name: Allison [name] MRN: [id number] Date of Birth: 11-Aug-1950  Subjective/Objective:  Pt lives with spouse, independent PTA.  PT recommends home therapy and pt agrees.  Presented list of agencies, pt states she does not have her glasses and wants to wait until she can read each entry - left list of agencies per request.  CM offered to call pt's husband but she said "no, don't want to agitate him."  Also states that he uses a cane for ambulation.  Attempted to call 3 daughters and 1 son listed on facesheet to determine level of support pt will have upon discharge - no answer.  CM will check back after pt has an opportunity to read list.                               Action/Plan: Pt s/p AVR/MVR/TVR- from home with spouse- tx from ICU to 2W-    Expected Discharge Date:   04/20/16               Expected Discharge Plan:  Home w Home Health Services  In-House Referral:  Clinical Social Work  Discharge planning Services  CM Consult  Post Acute Care Choice:  Home Health, Durable Medical Equipment Choice offered to:  Patient  DME Arranged:  Walker rolling DME Agency:  NA  HH Arranged:  PT HH Agency:  [facility]  Status of Service:  Completed, signed off  If discussed at Long Length of Stay Meetings, dates discussed:    Discharge Disposition: skilled facility   Additional Comments:  04/20/16- 1400- Allison Mele rN, CM- pt for d/c to SNF today- CSW following for placement needs- [facility] has been notified that pt will not need HH services..   04/19/16- 1600- Allison Mangan RN, CM- pt and MD want to look at SNF option for d/c- CSW has been consulted and will see pt regarding SNF option.   04/18/16- 1030- Allison Riker RN, CM- f/u done with pt at bedside regarding choice of HH agency in [facility]- per pt she would like to try [facility] for services- pt states that  she has a cane and RW at home- referral called to [place] with [facility] for HHPT-   Allison Alpers Lenn Sink, RN 04/20/2016, 2:00 PM [phone]

## 2016-04-20 NOTE — Progress Notes (Signed)
Clinical Social Worker spoke to patients daughter Crystal via phone to let her be aware of SNF offers on patients behalf. Crystal stated she prefers [facility] SNF and wants her mother to discharge to that facility. CSW stated that [facility] has not yet put in a offer for patient but their was 8 other facility that did. Crystal stated she wants opportunity to call [facility] facility and speak to admission coordinator [place]any [place]. Crystal stated she will contact CSW back or have facility reach out if they can take patient.  CSW contacted facility after patients daughter stated they can take patient today. [facility] (admissions Coordinator for [employer])  stated they have a private room for patient and is waiting for patients daughter to come to facility and fill out paperwork before patient can discharge. Corrie Dandy stated she will contact CSW when paperwork has been filled and facility is ready for patient. CSW made appointment for 1 pm for [place] (Deaf interpreter)  to interpret and relay information to patient about discharge plans. CSW remains available for support.  Marrianne Mood, MSW,  [employer] [phone]

## 2016-04-20 NOTE — Progress Notes (Signed)
Patient discharged to SNF with family, prescriptions given to daughter. IVs and tele removed. Patient stable at time of discharge.

## 2016-04-20 NOTE — Discharge Instructions (Signed)
1. Please obtain vital signs at least one time daily 2.Please weigh the patient daily. If he or she continues to gain weight or develops lower extremity edema, contact the office at [phone]. 3. Ambulate patient at least three times daily and please use sternal precautions.     Aortic Valve Replacement, Care After Refer to this sheet in the next few weeks. These instructions provide you with information about caring for yourself after your procedure. Your health care provider may also give you more specific instructions. Your treatment has been planned according to current medical practices, but problems sometimes occur. Call your health care provider if you have any problems or questions after your procedure. What can I expect after the procedure? After the procedure, it is common to have:  Pain around your incision area.  A small amount of blood or clear fluid coming from your incision. Follow these instructions at home: Eating and drinking  Follow instructions from your health care provider about eating or drinking restrictions.  Limit alcohol intake to no more than 1 drink per day for nonpregnant women and 2 drinks per day for men. One drink equals 12 oz of beer, 5 oz of wine, or 1 oz of hard liquor.  Limit how much caffeine you drink. Caffeine can affect your heart's rate and rhythm.  Drink enough fluid to keep your urine clear or pale yellow.  Eat a heart-healthy diet. This should include plenty of fresh fruits and vegetables. If you eat meat, it should be lean cuts. Avoid foods that are:  High in salt, saturated fat, or sugar.  Canned or highly processed.  [place]. Activity  Return to your normal activities as told by your health care provider. Ask your health care provider what activities are safe for you.  Exercise regularly once you have recovered, as told by your health care provider.  Avoid sitting for more than 2 hours at a time without moving. Get up and move  around at least once every 1-2 hours. This helps to prevent blood clots in the legs.  Do not lift anything that is heavier than 10 lb (4.5 kg) until your health care provider approves.  Avoid pushing or pulling things with your arms until your health care provider approves. This includes pulling on handrails to help you climb stairs. Incision care  Follow instructions from your health care provider about how to take care of your incision. Make sure you:  Wash your hands with soap and water before you change your bandage (dressing). If soap and water are not available, use hand sanitizer.  Change your dressing as told by your health care provider.  Leave stitches (sutures), skin glue, or adhesive strips in place. These skin closures may need to stay in place for 2 weeks or longer. If adhesive strip edges start to loosen and curl up, you may trim the loose edges. Do not remove adhesive strips completely unless your health care provider tells you to do that.  Check your incision area every day for signs of infection. Check for:  More redness, swelling, or pain.  More fluid or blood.  Warmth.  Pus or a bad smell. Medicines  Take over-the-counter and prescription medicines only as told by your health care provider.  If you were prescribed an antibiotic medicine, take it as told by your health care provider. Do not stop taking the antibiotic even if you start to feel better. Travel  Avoid airplane travel for as long as told by your  health care provider.  When you travel, bring a list of your medicines and a record of your medical history with you. Carry your medicines with you. Driving  Ask your health care provider when it is safe for you to drive. Do not drive until your health care provider approves.  Do not drive or operate heavy machinery while taking prescription pain medicine. Lifestyle  Do not use any tobacco products, such as cigarettes, chewing tobacco, or e-cigarettes.  If you need help quitting, ask your health care provider.  Resume sexual activity as told by your health care provider. Do not use medicines for erectile dysfunction unless your health care provider approves, if this applies.  Work with your health care provider to keep your blood pressure and cholesterol under control, and to manage any other heart conditions that you have.  Maintain a healthy weight. General instructions  Do not take baths, swim, or use a hot tub until your health care provider approves.  Do not strain to have a bowel movement.  Avoid crossing your legs while sitting down.  Check your temperature every day for a fever. A fever may be a sign of infection.  If you are a woman and you plan to become pregnant, talk with your health care provider before you become pregnant.  Wear compression stockings if your health care provider instructs you to do this. These stockings help to prevent blood clots and reduce swelling in your legs.  Tell all health care providers who care for you that you have an artificial (prosthetic) aortic valve. If you have or have had heart disease or endocarditis, tell all health care providers about these conditions as well.  Keep all follow-up visits as told by your health care provider. This is important. Contact a health care provider if:  You develop a skin rash.  You experience sudden, unexplained changes in your weight.  You have more redness, swelling, or pain around your incision.  You have more fluid or blood coming from your incision.  Your incision feels warm to the touch.  You have pus or a bad smell coming from your incision.  You have a fever. Get help right away if:  You develop chest pain that is different from the pain coming from your incision.  You develop shortness of breath or difficulty breathing.  You start to feel light-headed. These symptoms may represent a serious problem that is an emergency. Do not wait  to see if the symptoms will go away. Get medical help right away. Call your local emergency services (911 in the [place] Do not drive yourself to the hospital.  This information is not intended to replace advice given to you by your health care provider. Make sure you discuss any questions you have with your health care provider. Document Released: 11/03/2004 Document Revised: 09/23/2015 Document Reviewed: 03/21/2015 Elsevier Interactive Patient Education  2017 [employer]   Vitamin K Foods and Warfarin Warfarin is a blood thinner (anticoagulant). Anticoagulant medicines help prevent the formation of blood clots. These medicines work by decreasing the activity of vitamin K, which promotes normal blood clotting. When you take warfarin, problems can occur from suddenly increasing or decreasing the amount of vitamin K that you eat from one day to the next. Problems may include:  Blood clots.  Bleeding. What general guidelines do I need to follow? To avoid problems when taking warfarin:  Eat a balanced diet that includes:  Fresh fruits and vegetables.  Whole grains.  Low-fat dairy  products.  Lean proteins, such as fish, eggs, and lean cuts of meat.  Keep your intake of vitamin K consistent from day to day. To do this:  Avoid eating large amounts of vitamin K one day and low amounts of vitamin K the next day.  If you take a multivitamin that contains vitamin K, be sure to take it every day.  Know which foods contain vitamin K. Use the lists below to understand serving sizes and the amount of vitamin K in one serving.  Avoid major changes in your diet. If you are going to change your diet, talk with your health care provider before making changes.  Work with a Dealer (dietitian) to develop a meal plan that works best for you. High vitamin K foods Foods that are high in vitamin K contain more than 100 mcg (micrograms) per serving. These include:  Broccoli (cooked) -   cup has 110 mcg.  Brussels sprouts (cooked) -  cup has 109 mcg.  Greens, beet (cooked) -  cup has 350 mcg.  Greens, collard (cooked) -  cup has 418 mcg.  Greens, turnip (cooked) -  cup has 265 mcg.  Green onions or scallions -  cup has 105 mcg.  Kale (fresh or frozen) -  cup has 531 mcg.  Parsley (raw) - 10 sprigs has 164 mcg.  Spinach (cooked) -  cup has 444 mcg.  Swiss chard (cooked) -  cup has 287 mcg. Moderate vitamin K foods Foods that have a moderate amount of vitamin K contain 25-100 mcg per serving. These include:  Asparagus (cooked) - 5 spears have 38 mcg.  Black-eyed peas (dried) -  cup has 32 mcg.  Cabbage (cooked) -  cup has 37 mcg.  Kiwi fruit - 1 medium has 31 mcg.  Lettuce - 1 cup has 57-63 mcg.  Okra (frozen) -  cup has 44 mcg.  Prunes (dried) - 5 prunes have 25 mcg.  Watercress (raw) - 1 cup has 85 mcg. Low vitamin K foods Foods low in vitamin K contain less than 25 mcg per serving. These include:  Artichoke - 1 medium has 18 mcg.  Avocado - 1 oz. has 6 mcg.  Blueberries -  cup has 14 mcg.  Cabbage (raw) -  cup has 21 mcg.  Carrots (cooked) -  cup has 11 mcg.  Cauliflower (raw) -  cup has 11 mcg.  Cucumber with peel (raw) -  cup has 9 mcg.  Grapes -  cup has 12 mcg.  Mango - 1 medium has 9 mcg.  Nuts - 1 oz. has 15 mcg.  Pear - 1 medium has 8 mcg.  Peas (cooked) -  cup has 19 mcg.  Pickles - 1 spear has 14 mcg.  Pumpkin seeds - 1 oz. has 13 mcg.  Sauerkraut (canned) -  cup has 16 mcg.  Soybeans (cooked) -  cup has 16 mcg.  Tomato (raw) - 1 medium has 10 mcg.  Tomato sauce -  cup has 17 mcg. Vitamin K-free foods If a food contain less than 5 mcg per serving, it is considered to have no vitamin K. These foods include:  Bread and cereal products.  Cheese.  Eggs.  Fish and shellfish.  Meat and poultry.  Milk and dairy products.  Sunflower seeds. Actual amounts of vitamin K in foods may be  different depending on processing. Talk with your dietitian about what foods you can eat and what foods you should avoid. This information is not intended  to replace advice given to you by your health care provider. Make sure you discuss any questions you have with your health care provider. Document Released: 02/12/2009 Document Revised: 11/07/2015 Document Reviewed: 07/21/2015 Elsevier Interactive Patient Education  2017 [employer].   Information on my medicine - Coumadin   (Warfarin)  This medication education was reviewed with me or my healthcare representative as part of my discharge preparation.   Why was Coumadin prescribed for you? Coumadin was prescribed for you because you have a blood clot or a medical condition that can cause an increased risk of forming blood clots. Blood clots can cause serious health problems by blocking the flow of blood to the heart, lung, or brain. Coumadin can prevent harmful blood clots from forming. As a reminder your indication for Coumadin is:   Blood Clot Prevention After Heart Valve Surgery  What test will check on my response to Coumadin? While on Coumadin (warfarin) you will need to have an INR test regularly to ensure that your dose is keeping you in the desired range. The INR (international normalized ratio) number is calculated from the result of the laboratory test called prothrombin time (PT).  If an INR APPOINTMENT HAS NOT ALREADY BEEN MADE FOR YOU please schedule an appointment to have this lab work done by your health care provider within 7 days. Your INR goal is usually a number between:  2 to 3 or your provider may give you a more narrow range like 2-2.5.  Ask your health care provider during an office visit what your goal INR is.  What  do you need to  know  About  COUMADIN? Take Coumadin (warfarin) exactly as prescribed by your healthcare provider about the same time each day.  DO NOT stop taking without talking to the doctor who  prescribed the medication.  Stopping without other blood clot prevention medication to take the place of Coumadin may increase your risk of developing a new clot or stroke.  Get refills before you run out.  What do you do if you miss a dose? If you miss a dose, take it as soon as you remember on the same day then continue your regularly scheduled regimen the next day.  Do not take two doses of Coumadin at the same time.  Important Safety Information A possible side effect of Coumadin (Warfarin) is an increased risk of bleeding. You should call your healthcare provider right away if you experience any of the following: ? Bleeding from an injury or your nose that does not stop. ? Unusual colored urine (red or dark brown) or unusual colored stools (red or black). ? Unusual bruising for unknown reasons. ? A serious fall or if you hit your head (even if there is no bleeding).  Some foods or medicines interact with Coumadin (warfarin) and might alter your response to warfarin. To help avoid this: ? Eat a balanced diet, maintaining a consistent amount of Vitamin K. ? Notify your provider about major diet changes you plan to make. ? Avoid alcohol or limit your intake to 1 drink for women and 2 drinks for men per day. (1 drink is 5 oz. wine, 12 oz. beer, or 1.5 oz. liquor.)  Make sure that ANY health care provider who prescribes medication for you knows that you are taking Coumadin (warfarin).  Also make sure the healthcare provider who is monitoring your Coumadin knows when you have started a new medication including herbals and non-prescription products.  Coumadin (Warfarin)  Major  Drug Interactions  Increased Warfarin Effect Decreased Warfarin Effect  Alcohol (large quantities) Antibiotics (esp. Septra/Bactrim, Flagyl, Cipro) Amiodarone (Cordarone) Aspirin (ASA) Cimetidine (Tagamet) Megestrol (Megace) NSAIDs (ibuprofen, naproxen, etc.) Piroxicam (Feldene) Propafenone (Rythmol  SR) Propranolol (Inderal) Isoniazid (INH) Posaconazole (Noxafil) Barbiturates (Phenobarbital) Carbamazepine (Tegretol) Chlordiazepoxide (Librium) Cholestyramine (Questran) Griseofulvin Oral Contraceptives Rifampin Sucralfate (Carafate) Vitamin K   Coumadin (Warfarin) Major Herbal Interactions  Increased Warfarin Effect Decreased Warfarin Effect  Garlic Ginseng Ginkgo biloba Coenzyme Q10 Green tea St. Johns wort    Coumadin (Warfarin) FOOD Interactions  Eat a consistent number of servings per week of foods HIGH in Vitamin K (1 serving =  cup)  Collards (cooked, or boiled & drained) Kale (cooked, or boiled & drained) Mustard greens (cooked, or boiled & drained) Parsley *serving size only =  cup Spinach (cooked, or boiled & drained) Swiss chard (cooked, or boiled & drained) Turnip greens (cooked, or boiled & drained)  Eat a consistent number of servings per week of foods MEDIUM-HIGH in Vitamin K (1 serving = 1 cup)  Asparagus (cooked, or boiled & drained) Broccoli (cooked, boiled & drained, or raw & chopped) Brussel sprouts (cooked, or boiled & drained) *serving size only =  cup Lettuce, raw (green leaf, endive, romaine) Spinach, raw Turnip greens, raw & chopped   These websites have more information on Coumadin (warfarin):  http://www.king-russell.com/; https://www.hines.net/;

## 2016-04-20 NOTE — Progress Notes (Signed)
CARDIAC REHAB PHASE I   Ed completed with translator. Voiced understanding. Reviewed HF, daily wts, low sodium. Will refer to [facility].  [id number]  Allison [name] CES, ACSM 04/20/2016 2:00 PM

## 2016-04-26 ENCOUNTER — Telehealth: Payer: Self-pay | Admitting: *Deleted

## 2016-04-26 NOTE — Telephone Encounter (Signed)
Had received phone message from Evlyn Clines that Allison [name] was admitted to [facility] in [place] for rehab. After speaking with Judeth Cornfield at [facility] she is a pt there admitted on 04/20/2016  and her INR will be followed and coumadin dosed while she is there as they are SNF and will be followed by [facility]. Stephanie instructed to call our clinic to let us know when she is discharged and also left message on Evlyn Clines voice mail to let us know when discharged so we can continue to follow her INR and coumadin dosing [facility] phone number is [phone]

## 2016-05-08 ENCOUNTER — Encounter: Payer: Medicare Other | Admitting: Physician Assistant

## 2016-05-09 ENCOUNTER — Ambulatory Visit (INDEPENDENT_AMBULATORY_CARE_PROVIDER_SITE_OTHER): Payer: Medicare Other | Admitting: Physician Assistant

## 2016-05-09 ENCOUNTER — Encounter: Payer: Self-pay | Admitting: Physician Assistant

## 2016-05-09 ENCOUNTER — Encounter (INDEPENDENT_AMBULATORY_CARE_PROVIDER_SITE_OTHER): Payer: Self-pay

## 2016-05-09 VITALS — BP 112/56 | HR 82 | Ht 67.0 in | Wt 199.0 lb

## 2016-05-09 DIAGNOSIS — Z9889 Other specified postprocedural states: Secondary | ICD-10-CM

## 2016-05-09 DIAGNOSIS — I428 Other cardiomyopathies: Secondary | ICD-10-CM | POA: Diagnosis not present

## 2016-05-09 DIAGNOSIS — I4819 Other persistent atrial fibrillation: Secondary | ICD-10-CM

## 2016-05-09 DIAGNOSIS — Z79899 Other long term (current) drug therapy: Secondary | ICD-10-CM

## 2016-05-09 DIAGNOSIS — I447 Left bundle-branch block, unspecified: Secondary | ICD-10-CM | POA: Insufficient documentation

## 2016-05-09 DIAGNOSIS — I481 Persistent atrial fibrillation: Secondary | ICD-10-CM | POA: Diagnosis not present

## 2016-05-09 DIAGNOSIS — I5043 Acute on chronic combined systolic (congestive) and diastolic (congestive) heart failure: Secondary | ICD-10-CM | POA: Diagnosis not present

## 2016-05-09 DIAGNOSIS — R0602 Shortness of breath: Secondary | ICD-10-CM

## 2016-05-09 MED ORDER — FUROSEMIDE 40 MG PO TABS: ORAL_TABLET | ORAL | 0 refills | Status: DC

## 2016-05-09 NOTE — Patient Instructions (Addendum)
Medication Instructions:  CONTINUE THE FUROSEMIDE (LASIX) 80 MG DAILY FOR 5 MORE DAYS THEN DECREASE TO 40 MG DAILY  Labwork: CBC/CMET/TSH/BNP TODAY   Testing/Procedures: Your physician has requested that you have an echocardiogram. Echocardiography is a painless test that uses sound waves to create images of your heart. It provides your doctor with information about the size and shape of your heart and how well your heart's chambers and valves are working. This procedure takes approximately one hour. There are no restrictions for this procedure.  Follow-Up: Your physician recommends that you schedule a follow-up appointment in: 1 WEEK WITH DR Eldridge Dace OR NP/PA  Any Other Special Instructions Will Be Listed Below (If Applicable). CALL IF EDEMA (SWELLING) DOES NOT RESOLVE AFTER 5 DAYS ON HIGHER DOSE OR GETS WORSE   LIMIT YOUR SODIUM (SALT) INTAKE TO NO MORE THAN 2,000 MG PER DAY   Fluid Restriction Introduction Some health conditions may require you to restrict your fluid intake. This means that you need to limit the amount of fluid you drink each day. When you have a fluid restriction, you must carefully measure and keep track of the amount of fluid you drink. Your health care provider will identify the specific amount of fluid you are allowed each day. This amount may depend on several things, such as:  The amount of urine you produce in a day.  How much fluid you are keeping (retaining) in your body.  Your blood pressure. What is my plan? Your health care provider recommends that you limit your fluid intake to ______64 OUNCES____ per day. What counts toward my fluid intake? Your fluid intake includes all liquids that you drink, as well as any foods that become liquid at room temperature. The following are examples of some fluids that you will have to restrict:  Tea, coffee, soda, lemonade, milk, water, juice, sport drinks, and nutritional supplement beverages.  Alcoholic  beverages.  Cream.  Gravy.  Ice cubes.  Soup and broth. The following are examples of foods that become liquid at room temperature. These foods will also count toward your fluid intake.  Ice cream and ice milk.  Frozen yogurt and sherbet.  Frozen ice pops.  Flavored gelatin. How do I keep track of my fluid intake? Each morning, fill a jug with the amount of water that equals the amount of fluid you are allowed for the day. You can use this water as a guideline for fluid allowance. Each time you take in any form of fluid, including ice cubes and foods that become liquid at room temperature, pour an equal amount of water out of the container. This helps you to see how much fluid you are taking in. It also helps you to see how much of your fluid intake is left for the rest of the day. The following conversions may also be helpful in measuring your fluid intake:  1 cup equals 8 oz (240 mL).   cup equals 6 oz (180 mL).  ? cup equals 5? oz (160 mL).   cup equals 4 oz (120 mL).  ? cup equals 2? oz (80 mL).   cup equals 2 oz (60 mL).  2 Tbsp equals 1 oz (30 mL). What home care instructions should I follow while restricting fluids?  Make sure that you stay within the recommended limit each day. Always measure and keep track of your fluids, as well as any foods that turn liquid at room temperature.  Use small cups and glasses and learn to sip  fluids slowly.  Add a slice of fresh lemon or lemon juice to water or ice. This helps to satisfy your thirst.  Freeze fruit juice or water in an ice cube tray. Use this as part of your fluid allowance. These cubes are useful for quenching your thirst. Measure the amount of liquid in each ice cube prior to freezing so you can subtract this amount from your day's allowance when you consume each frozen cube.  Try frozen fruits between meals, such as grapes or strawberries.  Swallow your pills along with meals or soft foods, such as  applesauce or mashed potatoes. This helps you to save your fluid allowance for something that you enjoy.  Weigh yourself every day. Keeping track of your daily weight can help you and your health care provider to notice as soon as possible if you are retaining too much fluid in your body.  Weigh yourself every morning after you urinate but before you eat breakfast.  Wear the same amount of clothing each time you weigh yourself.  Write down your daily weight. Give this weight record to your health care provider. If your weight is going up, you may be retaining too much fluid. Every 2 cups (480 mL) of fluid retained in the body becomes an extra 1 lb (0.45 kg) of body weight.  Avoid salty foods. These foods make you thirsty and make fluid control more difficult.  Brush your teeth often or rinse your mouth with mouthwash to help your dry mouth. Lemon wedges, hard sour candies, chewing gum, or breath spray may also help to moisten your mouth.  Keep the temperature in your home at a cooler level. Dry air increases thirst, so keep the air in your home as humid as possible.  Avoid being out in the hot sun, which can cause you to sweat and become thirsty. What are some signs that I may be taking in too much fluid? You may be taking in too much fluid if:  Your weight increases. Contact your health care provider if your weight increases 3 lb or more in a day or if it increases 5 lb or more in a week.  Your face, hands, legs, feet, and belly (abdomen) start to swell.  You have trouble breathing. This information is not intended to replace advice given to you by your health care provider. Make sure you discuss any questions you have with your health care provider. Document Released: 02/12/2007 Document Revised: 09/23/2015 Document Reviewed: 09/16/2013

## 2016-05-09 NOTE — Progress Notes (Signed)
Cardiology Office Note    Date:  05/09/2016  ID:  Allison [name], DOB 16-Oct-1950, MRN [id number] PCP:  [name], MD  Cardiologist:  Dr. [facility]   Chief Complaint: f/u valve surgery  History of Present Illness:  Allison [name] is a 66 y.o. female with history of HTN, persistent atrial fib, NICM (normal cors 01/2016), chronic combined CHF, thyroid disease, valvular heart disease s/p MV repair, pericardial AVR, and TV repair 03/2016, and bilateral deafness who presents for post-hospital follow-up.  To recap history, she was initially diagnosed with PAF in seting of hyperthyroidism several years ago. She had return of AF in 2016. She was   She was started on Eliquis and underwent successful cardioversion one month later but quickly went back into atrial fibrillation.  She was referred to the [facility] and started on Flecainide but developed abdominal pain. She was switched to Propafenone and underwent successful cardioversion, but Propafenone was later stopped due to side effects. She was not felt to be a good candidate for Multaq. In September 2017 she developed worsening SOB, palpitations, and orthopnea with recurrent AF RVR. 2D echo 01/2016: EF 20-25%, LBBB, mild-mod AI, mod-severe MR, mild LAE, mild-mod MR. TEE 01/2016: EF 40% with duffse HK, severe central MR due to inadequate leaflet coaptation, moderate AI. LHC 02/18/16: no CAD, elevated PWCP, low cardiac output. Cardiac MRI 03/2016: EF 45%, moderate AI, severe central MR, moderate TR, normal RV size, no definitive evidence for prior MI, infiltrative disease, or myocarditis. On 04/13/16 she underwent mitral valve repair/ring annuloplasty, aortic valve replacement [facility] Ease Pericardial Tissue Valve), tricuspid valve repair/ring annuloplasty, closure of PFO, and MAZE procedure. She was started on Coumadin with goal INR 2.5-3.5 per [facility]. Labs 03/2016: Na 132, K 3.1-5, Cr 0.92, Hgb 10.3 (improving from  post-op in 7-8 range), LFTs wnl. It appears she was started on amiodarone during this admission. Repeat labs 04/29/16: Hgb 9.6, Plt 516, Cr 0.70, Na 133, K 4.7, BUN 12.  Her INR is being followed by her nursing facility (we confirmed this on the phone).  She presents back for follow-up today accompanied by a translator for ASL. She has been steadily working with PT and is finding her endurance is down. She also reports increased LEE since arrival to the facility. She is drinking a lot of fluids, does not really pay attention to any restriction. She eats whatever is served to her. It sounds like the SNF physician increased her Lasix to 80mg  daily for several days. Review of MAR indicates this was given 1/4-05/06/16. She has not had any orthopnea or SOB at rest. No syncope or palpitations. She continues to have some post-op chest wall discomfort but no issues with dehiscence or suppuration.   Past Medical History:  Diagnosis Date  . AC (acromioclavicular) joint bone spurs    spurs in shoulders with pain  . Anxiety   . Aortic insufficiency   . Arthritis    all over  . Asthma    from reflux  . Chronic combined systolic and diastolic CHF (congestive heart failure) (HCC)   . Deafness   . Fibromyalgia   . GERD (gastroesophageal reflux disease)   . Hypertension   . Hypothyroidism   . Mitral regurgitation   . Non-ischemic cardiomyopathy (HCC)    a. Normal cors 01/2016.  Marland Kitchen Persistent atrial fibrillation (HCC)   . PFO (patent foramen ovale)    a. closure 03/2016.  . S/P aortic valve replacement with bioprosthetic valve 04/13/2016  23 mm [facility] Ease bovine pericardial tissue valve  . S/P mitral valve repair 04/13/2016   26 mm Sorin Memo 3D ring annuloplasty  . 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 Tricuspid regurgitation     Past Surgical History:  Procedure Laterality Date  . ABDOMINAL HYSTERECTOMY     partial  . AORTIC VALVE REPLACEMENT N/A  04/13/2016   Procedure: AORTIC VALVE REPLACEMENT (AVR) implanted with Magna Ease size 9mm;  Surgeon: Purcell Nails, MD;  Location: [facility] OR;  Service: Open Heart Surgery;  Laterality: N/A;  . CARDIAC CATHETERIZATION N/A 02/18/2016   Procedure: Right/Left Heart Cath and Coronary Angiography;  Surgeon: Laurey Morale, MD;  Location: [facility] INVASIVE CV LAB;  Service: Cardiovascular;  Laterality: N/A;  . CARDIOVERSION N/A 10/16/2014   Procedure: CARDIOVERSION;  Surgeon: Corky Crafts, MD;  Location: [facility] ENDOSCOPY;  Service: Cardiovascular;  Laterality: N/A;  . CARDIOVERSION N/A 12/04/2014   Procedure: CARDIOVERSION;  Surgeon: Thurmon Fair, MD;  Location: [facility] ENDOSCOPY;  Service: Cardiovascular;  Laterality: N/A;  . COLON SURGERY     spurs  . COLONOSCOPY  [id number]  . COLONOSCOPY WITH PROPOFOL  04/09/2012   Procedure: COLONOSCOPY WITH PROPOFOL;  Surgeon: Charolett Bumpers, MD;  Location: [facility] ENDOSCOPY;  Service: Endoscopy;  Laterality: N/A;  . ESOPHAGOGASTRODUODENOSCOPY ENDOSCOPY     several times  . EYE SURGERY     catarcts  . MAZE N/A 04/13/2016   Procedure: MAZE;  Surgeon: Purcell Nails, MD;  Location: [facility] OR;  Service: Open Heart Surgery;  Laterality: N/A;  . MITRAL VALVE REPAIR N/A 04/13/2016   Procedure: MITRAL VALVE REPAIR (MVR) using 56mm Sorin /MEMO 3D annuloplasty ring;  Surgeon: Purcell Nails, MD;  Location: [facility] OR;  Service: Open Heart Surgery;  Laterality: N/A;  . PATENT FORAMEN OVALE CLOSURE N/A 04/13/2016   Procedure: PATENT FORAMEN OVALE (PFO) CLOSURE;  Surgeon: Purcell Nails, MD;  Location: [facility] OR;  Service: Open Heart Surgery;  Laterality: N/A;  . TEE WITHOUT CARDIOVERSION N/A 02/18/2016   Procedure: TRANSESOPHAGEAL ECHOCARDIOGRAM (TEE);  Surgeon: Laurey Morale, MD;  Location: [facility] ENDOSCOPY;  Service: Cardiovascular;  Laterality: N/A;  . TEE WITHOUT CARDIOVERSION N/A 04/13/2016   Procedure: TRANSESOPHAGEAL ECHOCARDIOGRAM (TEE);  Surgeon: Purcell Nails, MD;  Location: [facility] OR;   Service: Open Heart Surgery;  Laterality: N/A;  . THYROID SURGERY  1991  . TONSILLECTOMY    . TOTAL SHOULDER ARTHROPLASTY Right 07/02/2012   Procedure: TOTAL SHOULDER ARTHROPLASTY;  Surgeon: Mable Paris, MD;  Location: [facility] OR;  Service: Orthopedics;  Laterality: Right;  Right total shoulder arthroplasty  . TRICUSPID VALVE REPLACEMENT N/A 04/13/2016   Procedure: TRICUSPID VALVE REPAIR using a T26 MC3 Edwards annuloplasty ring;  Surgeon: Purcell Nails, MD;  Location: [facility] OR;  Service: Open Heart Surgery;  Laterality: N/A;  . TUBAL LIGATION    . vericose surgery Right leg  05-1998  . WISDOM TOOTH EXTRACTION      Current Medications: Current Outpatient Prescriptions  Medication Sig Dispense Refill  . ALPRAZolam (XANAX) 0.25 MG tablet Take 1 tablet (0.25 mg total) by mouth 2 (two) times daily as needed for anxiety. 10 tablet 0  . amiodarone (PACERONE) 200 MG tablet Take 1 tablet (200 mg total) by mouth 2 (two) times daily after a meal. For 7 days, then decrease to once a day    . aspirin EC 81 MG EC tablet Take 1 tablet (81 mg total) by mouth  daily.    . Calcium Carbonate-Vitamin D (CALTRATE 600+D) 600-400 MG-UNIT per tablet Take 1 tablet by mouth 2 (two) times daily.     . carvedilol (COREG) 3.125 MG tablet Take 1 tablet (3.125 mg total) by mouth 2 (two) times daily with a meal.    . Cyanocobalamin (VITAMIN B-12) 5000 MCG SUBL Place 1 tablet under the tongue. For nerve pain in feet and metabolism support    . diphenhydrAMINE (BENADRYL) 25 mg capsule Take 25 mg by mouth daily as needed for itching.     . furosemide (LASIX) 40 MG tablet Take 1 tablet (40 mg total) by mouth daily. For 7 days, then resume 20 mg daily, which was home regimen 30 tablet   . gabapentin (NEURONTIN) 600 MG tablet Take 600 mg by mouth 3 (three) times daily.  6  . levothyroxine (SYNTHROID, LEVOTHROID) 125 MCG tablet Take 125 mcg by mouth daily before breakfast.    . LORazepam (ATIVAN) 2 MG tablet Take 1 tablet (2  mg total) by mouth at bedtime. For restful sleep 10 tablet 0  . losartan (COZAAR) 25 MG tablet Take 1 tablet (25 mg total) by mouth daily. 30 tablet 3  . montelukast (SINGULAIR) 10 MG tablet Take 10 mg by mouth daily before breakfast.     . omeprazole (PRILOSEC) 40 MG capsule Take 40 mg by mouth daily.     Marland Kitchen oxyCODONE (OXY IR/ROXICODONE) 5 MG immediate release tablet Take 1-2 tablets (5-10 mg total) by mouth every 3 (three) hours as needed for severe pain. 30 tablet 0  . polyvinyl alcohol (LIQUIFILM TEARS) 1.4 % ophthalmic solution 1 drop as needed for dry eyes.    . potassium chloride SA (K-DUR,KLOR-CON) 20 MEQ tablet Take 2 tablets (40 mEq total) by mouth daily.    . promethazine (PHENERGAN) 25 MG tablet Take 25 mg by mouth daily as needed for nausea or vomiting.    Marland Kitchen tiZANidine (ZANAFLEX) 2 MG tablet Take 2 mg by mouth 2 (two) times daily as needed for muscle spasms.     . traMADol (ULTRAM) 50 MG tablet Take 50 mg by mouth every 6 (six) hours as needed for moderate pain.     Marland Kitchen warfarin (COUMADIN) 2.5 MG tablet Take 1 tablet (2.5 mg total) by mouth daily at 6 PM. 30 tablet    No current facility-administered medications for this visit.      Allergies:   Biaxin [clarithromycin]; Levaquin [levofloxacin]; Trazodone and nefazodone; Sulfa antibiotics; Aleve [naproxen]; Bextra [valdecoxib]; Doxycycline; Durabac [apap-salicyl-phenyltolox-caff]; Estradiol; Macrobid [nitrofurantoin]; Motrin [ibuprofen]; Oxaprozin; Robaxin [methocarbamol]; and Topamax [topiramate]   Social History   Social History  . Marital status: Married    Spouse name: N/A  . Number of children: N/A  . Years of education: N/A   Social History Main Topics  . Smoking status: Never Smoker  . Smokeless tobacco: Never Used  . Alcohol use No  . Drug use: No  . Sexual activity: Not Asked   Other Topics Concern  . None   Social History Narrative  . None     Family History:  The patient's family history includes Arrhythmia  in her father and mother; Heart attack in her maternal uncle; Hypertension in her father, maternal grandmother, and mother; Kidney disease in her paternal grandfather; Lung cancer in her father; Stroke in her maternal grandmother.   ROS:   Please see the history of present illness.  All other systems are reviewed and otherwise negative.    PHYSICAL EXAM:  VS:  BP (!) 112/56   Pulse 82   Ht 5\' 7"  (1.702 m)   Wt 199 lb (90.3 kg)   BMI 31.17 kg/m   BMI: Body mass index is 31.17 kg/m. GEN: Well nourished, well developed WF, in no acute distress  HEENT: normocephalic, atraumatic Neck: no JVD, carotid bruits, or masses Cardiac: RRR; soft SEM at LUSB, no rubs or gallops, 1-2+ bilateral pitting edema up to shins. Surgical sternal scar without dehiscence or suppuration. Respiratory:  clear to auscultation bilaterally, normal work of breathing GI: soft, nontender, nondistended, + BS MS: no deformity or atrophy  Skin: warm and dry, no rash Neuro:  Alert and Oriented x 3, Strength and sensation are intact, follows commands Psych: euthymic mood, full affect  Wt Readings from Last 3 Encounters:  05/09/16 199 lb (90.3 kg)  04/20/16 193 lb 8 oz (87.8 kg)  04/10/16 198 lb 8 oz (90 kg)      Studies/Labs Reviewed:   EKG:  EKG was ordered today and personally reviewed by me and demonstrates NSR 82bpm, NSIVCD, nonspecific TW changes  Recent Labs: 01/20/2016: Brain Natriuretic Peptide 310.6 04/10/2016: ALT 18 04/14/2016: Magnesium 2.0 04/18/2016: Hemoglobin 10.3; Platelets 204 04/20/2016: BUN 13; Creatinine, Ser 0.92; Potassium 4.8; Sodium 132   Lipid Panel No results found for: CHOL, TRIG, HDL, CHOLHDL, VLDL, LDLCALC, LDLDIRECT  Additional studies/ records that were reviewed today include: Summarized above.    ASSESSMENT & PLAN:   1. Valvular heart disease - s/p surgery as above. Overall hemodynamically stable. Given worsening edema, will recheck echocardiogram. Has f/u with [facility] in  several days. She is maintained on Coumadin and aspirin without reports of any bleeding.  2. Persistent atrial fib - maintaining NSR on amiodarone 200mg  daily. Previously intolerant of multiple other antiarrhythmics. Check baseline TSH with labs. Her SNF is presently monitoring her Coumadin. Further monitoring of organ systems while on amiodarone will be at discretion of primary cardiologist. 3. Nonischemic cardiomyopathy/acute on chronic combined CHF - may be due to post-op fluid shifts. She also reports drinking a lot of fluid. Have asked her to fluid restrict to 64oz a day and no more than 2000mg  of sodium per day. Recheck labs today. Will increase Lasix back to 80mg  daily x 5 more days then continue 40mg  daily. If her edema does not improve or worsens, she is to contact the clinic for earlier f/u. Will repeat echocardiogram. 4. LBBB/fluctuating IVCD - reviewed with Dr. Elease Hashimoto. Felt possibly due to surgical edema/scarring. No significant bradycardia noted on exam today. Will follow.  Disposition: F/u with Dr. Eldridge Dace or APP in 1 week.   Medication Adjustments/Labs and Tests Ordered: Current medicines are reviewed at length with the patient today.  Concerns regarding medicines are outlined above. Medication changes, Labs and Tests ordered today are summarized above and listed in the Patient Instructions accessible in Encounters.   Thomasene Mohair PA-C  05/09/2016 2:28 PM    [facility] [address] [place], [place], [place]  [address] Phone: [phone]; Fax: [phone]

## 2016-05-10 ENCOUNTER — Telehealth: Payer: Self-pay | Admitting: *Deleted

## 2016-05-10 MED ORDER — FERROUS SULFATE 325 (65 FE) MG PO TABS: 325.0000 mg | ORAL_TABLET | Freq: Two times a day (BID) | ORAL | 3 refills | Status: DC

## 2016-05-10 NOTE — Telephone Encounter (Signed)
Called pt.  Interpreter [phone] with [employer] answered and called the number listed.  Pt was discharged from the facility this morning, so she interpreted to the pt re: lab results and medication change. Pt has been scheduled to come in for f/u 1/12/185 with Nada Boozer, NP @ 2:00. Pt, interpreter both advised they understand instructions given.

## 2016-05-11 ENCOUNTER — Other Ambulatory Visit: Payer: Self-pay

## 2016-05-11 ENCOUNTER — Telehealth: Payer: Self-pay | Admitting: Pharmacist

## 2016-05-11 ENCOUNTER — Telehealth: Payer: Self-pay | Admitting: *Deleted

## 2016-05-11 ENCOUNTER — Ambulatory Visit (HOSPITAL_COMMUNITY): Payer: Medicare Other | Attending: Cardiovascular Disease

## 2016-05-11 DIAGNOSIS — I4891 Unspecified atrial fibrillation: Secondary | ICD-10-CM | POA: Diagnosis not present

## 2016-05-11 DIAGNOSIS — I428 Other cardiomyopathies: Secondary | ICD-10-CM | POA: Diagnosis not present

## 2016-05-11 DIAGNOSIS — Z79899 Other long term (current) drug therapy: Secondary | ICD-10-CM | POA: Insufficient documentation

## 2016-05-11 DIAGNOSIS — I5043 Acute on chronic combined systolic (congestive) and diastolic (congestive) heart failure: Secondary | ICD-10-CM | POA: Insufficient documentation

## 2016-05-11 DIAGNOSIS — R0602 Shortness of breath: Secondary | ICD-10-CM | POA: Diagnosis not present

## 2016-05-11 DIAGNOSIS — Z9889 Other specified postprocedural states: Secondary | ICD-10-CM | POA: Diagnosis not present

## 2016-05-11 DIAGNOSIS — I422 Other hypertrophic cardiomyopathy: Secondary | ICD-10-CM | POA: Diagnosis not present

## 2016-05-11 DIAGNOSIS — Z952 Presence of prosthetic heart valve: Secondary | ICD-10-CM | POA: Diagnosis not present

## 2016-05-11 DIAGNOSIS — I481 Persistent atrial fibrillation: Secondary | ICD-10-CM | POA: Diagnosis not present

## 2016-05-11 DIAGNOSIS — I4819 Other persistent atrial fibrillation: Secondary | ICD-10-CM

## 2016-05-11 DIAGNOSIS — I447 Left bundle-branch block, unspecified: Secondary | ICD-10-CM

## 2016-05-11 NOTE — Telephone Encounter (Signed)
Left message with daughter to call back about warfarin/lovenox.    LM with nursing facility [facility] - per Judeth Cornfield at front desk - Care managers are in care plan meeting and Lupita Shutter will call once out of meeting.

## 2016-05-11 NOTE — Telephone Encounter (Signed)
Patient came in for an Echo.  Told techs that she had a nursing question.  Through sign language interpreter she asked me about all the bruising on her lower abdomin.  I asked her why she had this. It was from receiving Lovanox injections in the SNF.  Then she asked me who is going to give her the injection today because she can't give herself a shot.  She stated that she had been discharged from the SNF yesterday.  I pulled her last note from 05/09/16 with Ronie Spies.  Lovanox was not on that Medlist.  It states in the note that she is maintained on coumadin and aspirin.  She had 5 Lovanox injections in her med "bag".  I told her that she shouldn't still be using the Lovanox. She should be using the coumadin and aspirin now. She asked me to destroy the Lovanox that she didn't want to take it home.  She will be in to see Nada Boozer tomorrow. She had a written note from the SNF on script paper telling her to take coumadin 5.5mg  every day at 5:00pm. This was explained as to take a 5mg  coumadin pill and to break a 1mg  coumadin pill in half and take both together.  In her pill bag she only had 3mg  coumadin pills and 2.5mg  coumadin pills.  I explained to her that she should take one of each together at 5:00pm daily.  Karma Greaser is very confused over her meds.  I advised her to bring her meds with her to tomorrow's appt.  She needs further explanation of meds.  Lovanox given to pharmacist. I explained the situation to her and she stated that she will follow-up with Nada Boozer tomorrow when the patient is in to get the coumadin management under way.

## 2016-05-12 ENCOUNTER — Ambulatory Visit (INDEPENDENT_AMBULATORY_CARE_PROVIDER_SITE_OTHER): Payer: Medicare Other | Admitting: Pharmacist

## 2016-05-12 ENCOUNTER — Other Ambulatory Visit: Payer: Self-pay | Admitting: Thoracic Surgery (Cardiothoracic Vascular Surgery)

## 2016-05-12 ENCOUNTER — Other Ambulatory Visit: Payer: Self-pay | Admitting: *Deleted

## 2016-05-12 ENCOUNTER — Ambulatory Visit (INDEPENDENT_AMBULATORY_CARE_PROVIDER_SITE_OTHER): Payer: Medicare Other | Admitting: Cardiology

## 2016-05-12 ENCOUNTER — Encounter: Payer: Self-pay | Admitting: Cardiology

## 2016-05-12 VITALS — BP 98/60 | HR 78 | Ht 67.0 in | Wt 198.0 lb

## 2016-05-12 DIAGNOSIS — Z9889 Other specified postprocedural states: Secondary | ICD-10-CM

## 2016-05-12 DIAGNOSIS — I340 Nonrheumatic mitral (valve) insufficiency: Secondary | ICD-10-CM

## 2016-05-12 DIAGNOSIS — Z5181 Encounter for therapeutic drug level monitoring: Secondary | ICD-10-CM

## 2016-05-12 DIAGNOSIS — Z953 Presence of xenogenic heart valve: Secondary | ICD-10-CM

## 2016-05-12 DIAGNOSIS — R600 Localized edema: Secondary | ICD-10-CM

## 2016-05-12 DIAGNOSIS — I5043 Acute on chronic combined systolic (congestive) and diastolic (congestive) heart failure: Secondary | ICD-10-CM | POA: Diagnosis not present

## 2016-05-12 DIAGNOSIS — I351 Nonrheumatic aortic (valve) insufficiency: Secondary | ICD-10-CM

## 2016-05-12 DIAGNOSIS — Z7901 Long term (current) use of anticoagulants: Secondary | ICD-10-CM

## 2016-05-12 DIAGNOSIS — Z48812 Encounter for surgical aftercare following surgery on the circulatory system: Secondary | ICD-10-CM | POA: Diagnosis not present

## 2016-05-12 DIAGNOSIS — I447 Left bundle-branch block, unspecified: Secondary | ICD-10-CM

## 2016-05-12 DIAGNOSIS — I4891 Unspecified atrial fibrillation: Secondary | ICD-10-CM

## 2016-05-12 DIAGNOSIS — I428 Other cardiomyopathies: Secondary | ICD-10-CM

## 2016-05-12 DIAGNOSIS — Z952 Presence of prosthetic heart valve: Secondary | ICD-10-CM

## 2016-05-12 DIAGNOSIS — I481 Persistent atrial fibrillation: Secondary | ICD-10-CM

## 2016-05-12 DIAGNOSIS — I4819 Other persistent atrial fibrillation: Secondary | ICD-10-CM

## 2016-05-12 DIAGNOSIS — Z79899 Other long term (current) drug therapy: Secondary | ICD-10-CM

## 2016-05-12 DIAGNOSIS — I071 Rheumatic tricuspid insufficiency: Secondary | ICD-10-CM

## 2016-05-12 LAB — COMPREHENSIVE METABOLIC PANEL
ALBUMIN: 4.3 g/dL (ref 3.6–4.8)
ALK PHOS: 138 IU/L — AB (ref 39–117)
ALT: 12 IU/L (ref 0–32)
AST: 17 IU/L (ref 0–40)
Albumin/Globulin Ratio: 1.8 (ref 1.2–2.2)
BUN / CREAT RATIO: 6 — AB (ref 12–28)
BUN: 9 mg/dL (ref 8–27)
Bilirubin Total: <0.2 mg/dL (ref 0.0–1.2)
CO2: 21 mmol/L (ref 18–29)
CREATININE: 1.41 mg/dL — AB (ref 0.57–1.00)
Calcium: 9.3 mg/dL (ref 8.7–10.3)
Chloride: 92 mmol/L — ABNORMAL LOW (ref 96–106)
GFR calc Af Amer: 44 mL/min/{1.73_m2} — ABNORMAL LOW (ref >59)
GFR calc non Af Amer: 39 mL/min/{1.73_m2} — ABNORMAL LOW (ref >59)
GLOBULIN, TOTAL: 2.4 g/dL (ref 1.5–4.5)
GLUCOSE: 108 mg/dL — AB (ref 65–99)
Potassium: 4 mmol/L (ref 3.5–5.2)
SODIUM: 138 mmol/L (ref 134–144)
TOTAL PROTEIN: 6.7 g/dL (ref 6.0–8.5)

## 2016-05-12 LAB — CBC WITH DIFFERENTIAL/PLATELET
BASOS ABS: 0 10*3/uL (ref 0.0–0.2)
Basos: 0 %
EOS (ABSOLUTE): 0.1 10*3/uL (ref 0.0–0.4)
EOS: 2 %
HEMATOCRIT: 28.2 % — AB (ref 34.0–46.6)
HEMOGLOBIN: 9 g/dL — AB (ref 11.1–15.9)
IMMATURE GRANS (ABS): 0 10*3/uL (ref 0.0–0.1)
IMMATURE GRANULOCYTES: 0 %
LYMPHS ABS: 1.9 10*3/uL (ref 0.7–3.1)
Lymphs: 28 %
MCH: 26.4 pg — ABNORMAL LOW (ref 26.6–33.0)
MCHC: 31.9 g/dL (ref 31.5–35.7)
MCV: 83 fL (ref 79–97)
MONOCYTES: 10 %
Monocytes Absolute: 0.7 10*3/uL (ref 0.1–0.9)
NEUTROS PCT: 60 %
Neutrophils Absolute: 3.8 10*3/uL (ref 1.4–7.0)
Platelets: 337 10*3/uL (ref 150–379)
RBC: 3.41 x10E6/uL — AB (ref 3.77–5.28)
RDW: 16.8 % — ABNORMAL HIGH (ref 12.3–15.4)
WBC: 6.5 10*3/uL (ref 3.4–10.8)

## 2016-05-12 LAB — BASIC METABOLIC PANEL
BUN / CREAT RATIO: 9 — AB (ref 12–28)
BUN: 9 mg/dL (ref 8–27)
CHLORIDE: 96 mmol/L (ref 96–106)
CO2: 32 mmol/L — ABNORMAL HIGH (ref 18–29)
Calcium: 9.1 mg/dL (ref 8.7–10.3)
Creatinine, Ser: 0.96 mg/dL (ref 0.57–1.00)
GFR calc non Af Amer: 61 mL/min/{1.73_m2} (ref >59)
GFR, EST AFRICAN AMERICAN: 71 mL/min/{1.73_m2} (ref >59)
GLUCOSE: 99 mg/dL (ref 65–99)
Potassium: 3.7 mmol/L (ref 3.5–5.2)
SODIUM: 135 mmol/L (ref 134–144)

## 2016-05-12 LAB — CBC
Hematocrit: 26.2 % — ABNORMAL LOW (ref 34.0–46.6)
Hemoglobin: 8.5 g/dL — ABNORMAL LOW (ref 11.1–15.9)
MCH: 26.4 pg — AB (ref 26.6–33.0)
MCHC: 32.4 g/dL (ref 31.5–35.7)
MCV: 81 fL (ref 79–97)
PLATELETS: 329 10*3/uL (ref 150–379)
RBC: 3.22 x10E6/uL — ABNORMAL LOW (ref 3.77–5.28)
RDW: 17.5 % — ABNORMAL HIGH (ref 12.3–15.4)
WBC: 7.6 10*3/uL (ref 3.4–10.8)

## 2016-05-12 LAB — POCT INR: INR: 2.2

## 2016-05-12 LAB — PRO B NATRIURETIC PEPTIDE: NT-PRO BNP: 152 pg/mL (ref 0–301)

## 2016-05-12 LAB — TSH: TSH: 16.56 u[IU]/mL — AB (ref 0.450–4.500)

## 2016-05-12 MED ORDER — ENOXAPARIN SODIUM 150 MG/ML ~~LOC~~ SOLN: 1.0000 mg/kg | SUBCUTANEOUS | Status: AC | PRN

## 2016-05-12 NOTE — Patient Instructions (Addendum)
Medication Instructions:  Your physician has recommended you make the following change in your medication:    Labwork: TODAY:  STAT:  CBC & BMET , TSH & FREE T4  Testing/Procedures: None ordered  Follow-Up: Your physician recommends that you schedule a follow-up appointment in: KEEP YOUR APPOINTMENT FOR 03/18/17 WITH BRITTANY SIMMONS, PA-C   Any Other Special Instructions Will Be Listed Below (If Applicable).   If you need a refill on your cardiac medications before your next appointment, please call your pharmacy.

## 2016-05-12 NOTE — Progress Notes (Signed)
Cardiology Office Note   Date:  05/12/2016   ID:  Allison [name], DOB 03-07-1951, MRN [id number]  PCP:  [name], MD  Cardiologist:    Dr. Eldridge Dace / [facility]  Chief Complaint  Patient presents with  . Leg Swelling    recent /p MV repair, pericardial AVR, and TV repair 03/2016,      History of Present Illness: Allison [name] is a 66 y.o. female who presents for edema follow up after recent visit, which was post hospitalization.    She has a history of HTN, persistent atrial fib, NICM (normal cors 01/2016), chronic combined CHF, thyroid disease, valvular heart disease s/p MV repair, pericardial AVR, and TV repair 03/2016, and bilateral deafness.  To recap history, she was initially diagnosed with PAF in seting of hyperthyroidism several years ago. She had return of AF in 2016. She was   She was started on Eliquis and underwent successful cardioversion one month later but quickly went back into atrial fibrillation.  She was referred to the [facility] and started on Flecainide but developed abdominal pain. She was switched to Propafenone and underwent successful cardioversion, but Propafenone was later stopped due to side effects. She was not felt to be a good candidate for Multaq. In September 2017 she developed worsening SOB, palpitations, and orthopnea with recurrent AF RVR. 2D echo 01/2016: EF 20-25%, LBBB, mild-mod AI, mod-severe MR, mild LAE, mild-mod MR. TEE 01/2016: EF 40% with duffse HK, severe central MR due to inadequate leaflet coaptation, moderate AI. LHC 02/18/16: no CAD, elevated PWCP, low cardiac output. Cardiac MRI 03/2016: EF 45%, moderate AI, severe central MR, moderate TR, normal RV size, no definitive evidence for prior MI, infiltrative disease, or myocarditis. On 04/13/16 she underwent mitral valve repair/ring annuloplasty, aortic valve replacement [facility] Ease Pericardial Tissue Valve), tricuspid valve repair/ring annuloplasty, closure  of PFO, and MAZE procedure. She was started on Coumadin with goal INR 2.5-3.5 per [facility]. Labs 03/2016: Na 132, K 3.1-5, Cr 0.92, Hgb 10.3 (improving from post-op in 7-8 range), LFTs wnl. It appears she was started on amiodarone during this admission. Repeat labs 04/29/16: Hgb 9.6, Plt 516, Cr 0.70, Na 133, K 4.7, BUN 12.  Her INR is being followed by her nursing facility (we confirmed this on the phone).  AORTIC VALVE REPLACEMENT (AVR) implanted with Magna Ease size 72mm (N/A) MAZE (N/A) TRANSESOPHAGEAL ECHOCARDIOGRAM (TEE) (N/A) PATENT FORAMEN OVALE (PFO) CLOSURE (N/A) MITRAL VALVE REPAIR (MVR) using 73mm Sorin /MEMO 3D annuloplasty ring (N/A)  TRICUSPID VALVE REPAIR (TVR) using 81mm Edwards mce annuloplasty ring   She is now home.  INR 2.2.    Today her sign language interpreter is here with her.  She is glad to be home.  She continues with incisional pain and occ Lt arm pain.  No SOB.  She continues with edema of lower ext. And her wt has not decreased that much.  She has large ecchymosis on abd from the lovenox injections.  Her meds -she was not sure what to take and if she did not understand the med she did not take.  We spent long time to review meds and pharmacist helped as well.  She has missed a few doses of amiodarone.  She now understands what meds she is on and why to take them.    Her INR is low so she rec'd one dose of lovenox  Here in the office and her coumadin dose was increased.  All questions were answered.  On last visit with rise in her Cr her cozaar was stopped.  BP is still soft, but no significant dizziness. Her amiodarone is at 200 mg daily.   Past Medical History:  Diagnosis Date  . AC (acromioclavicular) joint bone spurs    spurs in shoulders with pain  . Anxiety   . Aortic insufficiency   . Arthritis    all over  . Asthma    from reflux  . Chronic combined systolic and diastolic CHF (congestive heart failure) (HCC)   . Deafness   . Fibromyalgia   . GERD  (gastroesophageal reflux disease)   . Hypertension   . Hypothyroidism   . Mitral regurgitation   . Non-ischemic cardiomyopathy (HCC)    a. Normal cors 01/2016.  Marland Kitchen Persistent atrial fibrillation (HCC)   . PFO (patent foramen ovale)    a. closure 03/2016.  . S/P aortic valve replacement with bioprosthetic valve 04/13/2016   23 mm [facility] Ease bovine pericardial tissue valve  . S/P mitral valve repair 04/13/2016   26 mm Sorin Memo 3D ring annuloplasty  . 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 Tricuspid regurgitation     Past Surgical History:  Procedure Laterality Date  . ABDOMINAL HYSTERECTOMY     partial  . AORTIC VALVE REPLACEMENT N/A 04/13/2016   Procedure: AORTIC VALVE REPLACEMENT (AVR) implanted with Magna Ease size 23mm;  Surgeon: Purcell Nails, MD;  Location: [facility] OR;  Service: Open Heart Surgery;  Laterality: N/A;  . CARDIAC CATHETERIZATION N/A 02/18/2016   Procedure: Right/Left Heart Cath and Coronary Angiography;  Surgeon: Laurey Morale, MD;  Location: [facility] INVASIVE CV LAB;  Service: Cardiovascular;  Laterality: N/A;  . CARDIOVERSION N/A 10/16/2014   Procedure: CARDIOVERSION;  Surgeon: Corky Crafts, MD;  Location: [facility] ENDOSCOPY;  Service: Cardiovascular;  Laterality: N/A;  . CARDIOVERSION N/A 12/04/2014   Procedure: CARDIOVERSION;  Surgeon: Thurmon Fair, MD;  Location: [facility] ENDOSCOPY;  Service: Cardiovascular;  Laterality: N/A;  . COLON SURGERY     spurs  . COLONOSCOPY  [id number]  . COLONOSCOPY WITH PROPOFOL  04/09/2012   Procedure: COLONOSCOPY WITH PROPOFOL;  Surgeon: Charolett Bumpers, MD;  Location: [facility] ENDOSCOPY;  Service: Endoscopy;  Laterality: N/A;  . ESOPHAGOGASTRODUODENOSCOPY ENDOSCOPY     several times  . EYE SURGERY     catarcts  . MAZE N/A 04/13/2016   Procedure: MAZE;  Surgeon: Purcell Nails, MD;  Location: [facility] OR;  Service: Open Heart Surgery;  Laterality: N/A;  . MITRAL VALVE REPAIR N/A 04/13/2016    Procedure: MITRAL VALVE REPAIR (MVR) using 26mm Sorin /MEMO 3D annuloplasty ring;  Surgeon: Purcell Nails, MD;  Location: [facility] OR;  Service: Open Heart Surgery;  Laterality: N/A;  . PATENT FORAMEN OVALE CLOSURE N/A 04/13/2016   Procedure: PATENT FORAMEN OVALE (PFO) CLOSURE;  Surgeon: Purcell Nails, MD;  Location: [facility] OR;  Service: Open Heart Surgery;  Laterality: N/A;  . TEE WITHOUT CARDIOVERSION N/A 02/18/2016   Procedure: TRANSESOPHAGEAL ECHOCARDIOGRAM (TEE);  Surgeon: Laurey Morale, MD;  Location: [facility] ENDOSCOPY;  Service: Cardiovascular;  Laterality: N/A;  . TEE WITHOUT CARDIOVERSION N/A 04/13/2016   Procedure: TRANSESOPHAGEAL ECHOCARDIOGRAM (TEE);  Surgeon: Purcell Nails, MD;  Location: [facility] OR;  Service: Open Heart Surgery;  Laterality: N/A;  . THYROID SURGERY  1991  . TONSILLECTOMY    . TOTAL SHOULDER ARTHROPLASTY Right 07/02/2012   Procedure: TOTAL SHOULDER ARTHROPLASTY;  Surgeon: Mable Paris, MD;  Location:  [facility] OR;  Service: Orthopedics;  Laterality: Right;  Right total shoulder arthroplasty  . TRICUSPID VALVE REPLACEMENT N/A 04/13/2016   Procedure: TRICUSPID VALVE REPAIR using a T26 MC3 Edwards annuloplasty ring;  Surgeon: Purcell Nails, MD;  Location: [facility] OR;  Service: Open Heart Surgery;  Laterality: N/A;  . TUBAL LIGATION    . vericose surgery Right leg  05-1998  . WISDOM TOOTH EXTRACTION       Current Outpatient Prescriptions  Medication Sig Dispense Refill  . ALPRAZolam (XANAX) 0.25 MG tablet Take 1 tablet (0.25 mg total) by mouth 2 (two) times daily as needed for anxiety. 10 tablet 0  . amiodarone (PACERONE) 200 MG tablet Take 1 tablet (200 mg total) by mouth 2 (two) times daily after a meal. For 7 days, then decrease to once a day    . aspirin EC 81 MG EC tablet Take 1 tablet (81 mg total) by mouth daily.    . Calcium Carbonate-Vitamin D (CALTRATE 600+D) 600-400 MG-UNIT per tablet Take 1 tablet by mouth 2 (two) times daily.     . carvedilol (COREG) 3.125 MG tablet  Take 1 tablet (3.125 mg total) by mouth 2 (two) times daily with a meal.    . Cyanocobalamin (VITAMIN B-12) 5000 MCG SUBL Place 1 tablet under the tongue. For nerve pain in feet and metabolism support    . diphenhydrAMINE (BENADRYL) 25 mg capsule Take 25 mg by mouth daily as needed for itching.     . ferrous sulfate 325 (65 FE) MG tablet Take 1 tablet (325 mg total) by mouth 2 (two) times daily with a meal. 180 tablet 3  . furosemide (LASIX) 40 MG tablet 2 TABLET BY MOUTH FOR 5 DAYS AND THEN 1 DAILY OR AS DIRECTED BY DOCTOR 40 tablet 0  . gabapentin (NEURONTIN) 600 MG tablet Take 600 mg by mouth 3 (three) times daily.  6  . levothyroxine (SYNTHROID, LEVOTHROID) 125 MCG tablet Take 125 mcg by mouth daily before breakfast.    . LORazepam (ATIVAN) 2 MG tablet Take 1 tablet (2 mg total) by mouth at bedtime. For restful sleep 10 tablet 0  . montelukast (SINGULAIR) 10 MG tablet Take 10 mg by mouth daily before breakfast.     . omeprazole (PRILOSEC) 40 MG capsule Take 40 mg by mouth daily.     Marland Kitchen oxyCODONE (OXY IR/ROXICODONE) 5 MG immediate release tablet Take 1-2 tablets (5-10 mg total) by mouth every 3 (three) hours as needed for severe pain. 30 tablet 0  . polyvinyl alcohol (LIQUIFILM TEARS) 1.4 % ophthalmic solution 1 drop as needed for dry eyes.    . potassium chloride SA (K-DUR,KLOR-CON) 20 MEQ tablet Take 2 tablets (40 mEq total) by mouth daily.    . promethazine (PHENERGAN) 25 MG tablet Take 25 mg by mouth daily as needed for nausea or vomiting.    Marland Kitchen tiZANidine (ZANAFLEX) 2 MG tablet Take 2 mg by mouth 2 (two) times daily as needed for muscle spasms.     . traMADol (ULTRAM) 50 MG tablet Take 50 mg by mouth every 6 (six) hours as needed for moderate pain.     Marland Kitchen warfarin (COUMADIN) 3 MG tablet      No current facility-administered medications for this visit.     Allergies:   Biaxin [clarithromycin]; Levaquin [levofloxacin]; Trazodone and nefazodone; Sulfa antibiotics; Aleve [naproxen]; Bextra  [valdecoxib]; Doxycycline; Durabac [apap-salicyl-phenyltolox-caff]; Estradiol; Macrobid [nitrofurantoin]; Motrin [ibuprofen]; Oxaprozin; Robaxin [methocarbamol]; and Topamax [topiramate]    Social History:  The patient  reports that she has never smoked. She has never used smokeless tobacco. She reports that she does not drink alcohol or use drugs.   Family History:  The patient's family history includes Arrhythmia in her father and mother; Heart attack in her maternal uncle; Hypertension in her father, maternal grandmother, and mother; Kidney disease in her paternal grandfather; Lung cancer in her father; Stroke in her maternal grandmother.    ROS:  General:no colds or fevers,  weight down 1 lb Skin:no rashes or ulcers, + ecchymosis of abd from lovenox injections HEENT:no blurred vision, no congestion CV:see HPI PUL:see HPI GI:no diarrhea some constipation or melena, no indigestion GU:no hematuria, no dysuria MS:no joint pain, no claudication Neuro:no syncope, very mild on occ lightheadedness Endo:no diabetes, + thyroid disease  Wt Readings from Last 3 Encounters:  05/12/16 198 lb (89.8 kg)  05/09/16 199 lb (90.3 kg)  04/20/16 193 lb 8 oz (87.8 kg)     PHYSICAL EXAM: VS:  BP 98/60 (BP Location: Right Arm, Patient Position: Sitting, Cuff Size: Normal)   Pulse 78   Ht 5\' 7"  (1.702 m)   Wt 198 lb (89.8 kg)   BMI 31.01 kg/m  , BMI Body mass index is 31.01 kg/m. General:Pleasant affect, NAD Skin:Warm and dry, brisk capillary refill. ecchymosis across abd with lovenox injections.  HEENT:normocephalic, sclera clear, mucus membranes moist Neck:supple, no JVD, no bruits  Heart:S1S2 RRR with soft systolic murmur, no gallup, rub or click Lungs:clear without rales, rhonchi, or wheezes UEA:VWUJ, non tender, + BS, do not palpate liver spleen or masses Ext: 1-2+ lower ext edema, 2+ pedal pulses, 2+ radial pulses Neuro:alert and oriented X 3, MAE, follows commands, + facial  symmetry    EKG:  EKG is NOT ordered today.    Recent Labs: 01/20/2016: Brain Natriuretic Peptide 310.6 04/14/2016: Magnesium 2.0 04/18/2016: Hemoglobin 10.3 05/09/2016: ALT 12; BUN 9; Creatinine, Ser 1.41; NT-Pro BNP 152; Platelets 337; Potassium 4.0; Sodium 138; TSH 16.560    Lipid Panel No results found for: CHOL, TRIG, HDL, CHOLHDL, VLDL, LDLCALC, LDLDIRECT     Other studies Reviewed: Additional studies/ records that were reviewed today include: . Echo 05/11/16  Study Conclusions  - Left ventricle: The cavity size was normal. Wall thickness was   normal. Systolic function was normal. The estimated ejection   fraction was in the range of 50% to 55%. Wall motion was normal;   there were no regional wall motion abnormalities. - Aortic valve: A bioprosthesis was present. - Mitral valve: A bioprosthesis was present. - Left atrium: The atrium was moderately dilated. - Tricuspid valve: Prior procedures included surgical repair.  ------------------------------------------------------------------- Mitral valve:  There is a mild - moderate gradient across the prosthetic MV. A bioprosthesis was present.  Doppler:  There was no regurgitation.    Valve area by pressure half-time: 2.12 cm^2. Indexed valve area by pressure half-time: 1.01 cm^2/m^2.    Mean gradient (D): 7 mm Hg. Peak gradient (D): 16 mm Hg.   ASSESSMENT AND PLAN:  1. Valvular heart disease - s/p surgery as above. Overall hemodynamically stable. Echo is stable.and EF improved.   Has f/u with [facility] in several days. She is maintained on Coumadin and aspirin without reports of any bleeding. Except for the ecchyomosis of abd.  + anemia placed on iron will check CBC today. 2. Persistent atrial fib - maintaining NSR on amiodarone 200mg  daily. Previously intolerant of multiple other antiarrhythmics. Re-Check baseline TSH as TSH was 16.5 earlier will check T4 pt now on  amiodarone.  . She is on coumadin and today in office a  little low one injection of lovenox here and coumadin adjusted. We are now following 3. Nonischemic cardiomyopathy/acute on chronic combined CHF - may be due to post-op fluid shifts. She also reports drinking a lot of fluid. Have asked her to fluid restrict to 64oz a day and no more than 2000mg  of sodium per day. Lasix was increased  back to 80mg  daily x 5  days then continue 40mg  daily. If her edema does not improve or worsens, she is to contact the clinic for earlier f/u. Marland Kitchen 4. LBBB/fluctuating IVCD and SR - from earlier this week Dayna reviewed with Dr. Elease Hashimoto. Felt possibly due to surgical edema/scarring. No significant bradycardia noted on exam today.  5. CKD 3 will recheck Cr and BMP today , should improve off cozaar. Current medicines are reviewed with the patient today.  The patient Has no concerns regarding medicines.see above in HPI for drug information  Pt was here from 1400 until 1530 pm with [place] and pharmacy and making sure she was on correct meds.   The following changes have been made:  See above Labs/ tests ordered today include:see above  Disposition:   FU:  see above  Signed, Nada Boozer, NP  05/12/2016 2:28 PM    [facility] [address] [place], [place], [place]  [address]/ [address] [place], [place] Phone: [phone]; Fax: [phone]  [phone]

## 2016-05-13 LAB — TSH: TSH: 11.68 u[IU]/mL — ABNORMAL HIGH (ref 0.450–4.500)

## 2016-05-13 LAB — T4, FREE: Free T4: 1.48 ng/dL (ref 0.82–1.77)

## 2016-05-15 ENCOUNTER — Ambulatory Visit
Admission: RE | Admit: 2016-05-15 | Discharge: 2016-05-15 | Disposition: A | Discharge status: Home / Self Care | Payer: Medicare Other | Source: Ambulatory Visit | Attending: Thoracic Surgery (Cardiothoracic Vascular Surgery) | Admitting: Thoracic Surgery (Cardiothoracic Vascular Surgery)

## 2016-05-15 ENCOUNTER — Ambulatory Visit (INDEPENDENT_AMBULATORY_CARE_PROVIDER_SITE_OTHER): Payer: Self-pay | Admitting: Surgical

## 2016-05-15 VITALS — BP 120/69 | HR 72 | Resp 16 | Wt 197.6 lb

## 2016-05-15 DIAGNOSIS — Z8679 Personal history of other diseases of the circulatory system: Secondary | ICD-10-CM

## 2016-05-15 DIAGNOSIS — I351 Nonrheumatic aortic (valve) insufficiency: Secondary | ICD-10-CM

## 2016-05-15 DIAGNOSIS — R600 Localized edema: Secondary | ICD-10-CM

## 2016-05-15 DIAGNOSIS — I481 Persistent atrial fibrillation: Secondary | ICD-10-CM

## 2016-05-15 DIAGNOSIS — Q2112 Patent foramen ovale: Secondary | ICD-10-CM

## 2016-05-15 DIAGNOSIS — Z952 Presence of prosthetic heart valve: Secondary | ICD-10-CM

## 2016-05-15 DIAGNOSIS — I340 Nonrheumatic mitral (valve) insufficiency: Secondary | ICD-10-CM

## 2016-05-15 DIAGNOSIS — I4819 Other persistent atrial fibrillation: Secondary | ICD-10-CM

## 2016-05-15 DIAGNOSIS — Q211 Atrial septal defect: Secondary | ICD-10-CM

## 2016-05-15 DIAGNOSIS — I361 Nonrheumatic tricuspid (valve) insufficiency: Secondary | ICD-10-CM

## 2016-05-15 DIAGNOSIS — Z9889 Other specified postprocedural states: Secondary | ICD-10-CM

## 2016-05-15 DIAGNOSIS — Z8774 Personal history of (corrected) congenital malformations of heart and circulatory system: Secondary | ICD-10-CM

## 2016-05-15 NOTE — Assessment & Plan Note (Signed)
conts with daily lasix

## 2016-05-15 NOTE — Assessment & Plan Note (Signed)
Excellent recovery from OHS

## 2016-05-15 NOTE — Patient Instructions (Signed)
Patient given verbal instructions regarding ongoing activity progression.

## 2016-05-15 NOTE — Progress Notes (Signed)
[address]       [place] [address]             [phone]                  Allison [name] [facility] Medical Record #[id number] Date of Birth: 06/03/1950  Referring OZ:[id number], Donnie Coffin, MD Primary Cardiology: Primary Care:HUSAIN,[name], MD  Chief Complaint:  Follow Up Visit  Date of Procedure:                04/13/2016  Preoperative Diagnosis:        Severe Mitral Regurgitation  Moderate Aortic Insufficiency  Moderate Tricuspid Regurgitation  Long-standing Persistent Atrial Fibrillation  Patent Foramen Ovale  Postoperative Diagnosis:      Severe Mitral Regurgitation  Moderate Aortic Insufficiency  Moderate Tricuspid Regurgitation  Long-standing Persistent Atrial Fibrillation  Patent Foramen Ovale  Procedure:        Mitral Valve Repair             Sorin Memo 3D ring annuloplasty (size 26mm, catalog #[id number], serial [id number])   Aortic Valve Replacement             Edwards Magna Ease Pericardial Tissue Valve (size 23 mm, model # 3300TFX, serial # [id number])   Tricuspid Valve Repair             Sorin Memo 3D ring annuloplasty (size 26mm, model #4900, serial #[id number])   Closure of Patent Foramen Ovale               Maze Procedure             Complete bilateral atrial lesion set using cryothermy and bipolar radiofrequency ablation             Clipping of left atrial appendage (Atricure Pro245 clip, size 45 mm)   Surgeon:        Salvatore Decent. Cornelius Moras, MD  Assistant:       Rowe Clack, PA-C  Anesthesia:    Achille Rich, MD  Operative Findings:  Dilated LV with mild global LV systolic dysfunction  Type I mitral valve dysfunction with severe mitral regurgitation  No residual mitral regurgitation after ring annuloplasty  Trileaflet aortic valve with moderate aortic insufficiency  Type I tricuspid valve dysfunction with severe tricuspid regurgitation  Mild residual tricuspid regurgitation after ring  annuloplasty   History of Present Illness:    Patient is a 66 year old female status post the above described procedure seen in today's visit for routine office follow-up. She spent a short-term in rehabilitation at a skilled facility but has now been discharged to home. She feels quite well. She denies shortness of breath. She does have some peripheral edema but continues to be on daily Lasix. She has some mild incisional pain that is intermittent and varies in quality and duration. She is getting relief from Ultram. Overall she feels that she is making good recovery and is in good spirits.     Zubrod Score: At the time of surgery this patient's most appropriate activity status/level should be described as: []     0    Normal activity, no symptoms []     1    Restricted in physical strenuous activity but ambulatory, able to do out light work []     2    Ambulatory and capable of self care, unable to do work activities, up and about                 >  50 % of waking hours                                                                                   []     3    Only limited self care, in bed greater than 50% of waking hours []     4    Completely disabled, no self care, confined to bed or chair []     5    Moribund  History  Smoking Status  . Never Smoker  Smokeless Tobacco  . Never Used       Allergies  Allergen Reactions  . Biaxin [Clarithromycin] Nausea Only and Other (See Comments)    Dizzy, blurred vision, achy stomach  . Levaquin [Levofloxacin] Nausea Only and Other (See Comments)    Dizzy, achy stomach, can't sleep  . Trazodone And Nefazodone Other (See Comments)    Weakness in legs Numbness in arms Funny feeling Rapid heart beat Lose focus  . Sulfa Antibiotics Other (See Comments)    Blister on large toe  . Aleve [Naproxen] Nausea And Vomiting and Other (See Comments)    Stomach ache  . Bextra [Valdecoxib] Nausea And Vomiting and Other (See Comments)    Stomach ache   .  Doxycycline Nausea Only and Other (See Comments)    Stomach ache  . Durabac [Apap-Salicyl-Phenyltolox-Caff] Nausea And Vomiting  . Estradiol Nausea Only and Other (See Comments)    Hurt stomach  . Macrobid [Nitrofurantoin] Other (See Comments)    BLOATING  . Motrin [Ibuprofen] Nausea Only and Other (See Comments)    Stomach ahce  . Oxaprozin Nausea Only and Other (See Comments)    Hurt stomach  . Robaxin [Methocarbamol] Nausea Only and Other (See Comments)    Hurt stomach  . Topamax [Topiramate] Nausea And Vomiting    Current Outpatient Prescriptions  Medication Sig Dispense Refill  . ALPRAZolam (XANAX) 0.25 MG tablet Take 1 tablet (0.25 mg total) by mouth 2 (two) times daily as needed for anxiety. 10 tablet 0  . amiodarone (PACERONE) 200 MG tablet Take 1 tablet (200 mg total) by mouth 2 (two) times daily after a meal. For 7 days, then decrease to once a day    . aspirin EC 81 MG EC tablet Take 1 tablet (81 mg total) by mouth daily.    . Calcium Carbonate-Vitamin D (CALTRATE 600+D) 600-400 MG-UNIT per tablet Take 1 tablet by mouth 2 (two) times daily.     . carvedilol (COREG) 3.125 MG tablet Take 1 tablet (3.125 mg total) by mouth 2 (two) times daily with a meal.    . Cyanocobalamin (VITAMIN B-12) 5000 MCG SUBL Place 1 tablet under the tongue. For nerve pain in feet and metabolism support    . diphenhydrAMINE (BENADRYL) 25 mg capsule Take 25 mg by mouth daily as needed for itching.     . ferrous sulfate 325 (65 FE) MG tablet Take 1 tablet (325 mg total) by mouth 2 (two) times daily with a meal. 180 tablet 3  . furosemide (LASIX) 40 MG tablet 2 TABLET BY MOUTH FOR 5 DAYS AND THEN 1 DAILY OR AS DIRECTED BY DOCTOR 40 tablet 0  .  gabapentin (NEURONTIN) 600 MG tablet Take 600 mg by mouth 3 (three) times daily.  6  . levothyroxine (SYNTHROID, LEVOTHROID) 125 MCG tablet Take 125 mcg by mouth daily before breakfast.    . LORazepam (ATIVAN) 2 MG tablet Take 1 tablet (2 mg total) by mouth at  bedtime. For restful sleep 10 tablet 0  . montelukast (SINGULAIR) 10 MG tablet Take 10 mg by mouth daily before breakfast.     . omeprazole (PRILOSEC) 40 MG capsule Take 40 mg by mouth daily.     Marland Kitchen oxyCODONE (OXY IR/ROXICODONE) 5 MG immediate release tablet Take 1-2 tablets (5-10 mg total) by mouth every 3 (three) hours as needed for severe pain. 30 tablet 0  . polyvinyl alcohol (LIQUIFILM TEARS) 1.4 % ophthalmic solution 1 drop as needed for dry eyes.    . potassium chloride SA (K-DUR,KLOR-CON) 20 MEQ tablet Take 2 tablets (40 mEq total) by mouth daily.    . promethazine (PHENERGAN) 25 MG tablet Take 25 mg by mouth daily as needed for nausea or vomiting.    Marland Kitchen tiZANidine (ZANAFLEX) 2 MG tablet Take 2 mg by mouth 2 (two) times daily as needed for muscle spasms.     . traMADol (ULTRAM) 50 MG tablet Take 50 mg by mouth every 6 (six) hours as needed for moderate pain.     Marland Kitchen warfarin (COUMADIN) 3 MG tablet      No current facility-administered medications for this visit.        Physical Exam: There were no vitals taken for this visit.  General appearance: alert, cooperative and no distress Heart: regular rate and rhythm and 2/6 systolic murmur Lungs: min dim in bases Extremities: + pedal edema Wounds:incis healing well without signs of infection  Diagnostic Studies & Laboratory data:         Recent Radiology Findings: Dg Chest 2 View  Result Date: 05/15/2016 CLINICAL DATA:  Anterior chest tenderness and shortness of breath. Status post mitral and tricuspid valve repair and aortic valve replacement on 04/13/2016. EXAM: CHEST  2 VIEW COMPARISON:  04/18/2016 FINDINGS: The tiny bilateral pleural effusions have almost completely resolved. No pneumothorax. Chronic cardiomegaly. Pulmonary vascularity is normal. IMPRESSION: No acute abnormalities. Almost complete resolution of the tiny bilateral pleural effusions. Electronically Signed   By: Francene Boyers M.D.   On: 05/15/2016 13:22      I have  independently reviewed the above radiology findings and reviewed findings  with the patient.  Recent Labs: Lab Results  Component Value Date   WBC 7.6 05/12/2016   HGB 10.3 (L) 04/18/2016   HCT 26.2 (L) 05/12/2016   PLT 329 05/12/2016   GLUCOSE 99 05/12/2016   ALT 12 05/09/2016   AST 17 05/09/2016   NA 135 05/12/2016   K 3.7 05/12/2016   CL 96 05/12/2016   CREATININE 0.96 05/12/2016   BUN 9 05/12/2016   CO2 32 (H) 05/12/2016   TSH 11.680 (H) 05/12/2016   INR 2.2 05/12/2016   HGBA1C 5.9 (H) 04/10/2016      Assessment / Plan:  The patient continues to make excellent overall recovery. She is now home. She will continue on her current medication regimen. She will continue ongoing follow-up at the Coumadin clinic as well as cardiology. We will see her again in 2 months or prior to that if she has any difficulty.       Valdez Brannan E 05/15/2016 1:27 PM

## 2016-05-16 ENCOUNTER — Other Ambulatory Visit: Payer: Self-pay | Admitting: *Deleted

## 2016-05-16 DIAGNOSIS — G8918 Other acute postprocedural pain: Secondary | ICD-10-CM

## 2016-05-16 MED ORDER — OXYCODONE HCL 5 MG PO TABS: 5.0000 mg | ORAL_TABLET | ORAL | 0 refills | Status: DC | PRN

## 2016-05-16 NOTE — Telephone Encounter (Signed)
[name] called the office to request a refill for Oxycodone. She is deaf and this was handled through operator assistance. She was informed that a new script would be available at the office today. She said her husband would be by this afternoon.

## 2016-05-18 ENCOUNTER — Ambulatory Visit: Payer: Medicare Other | Admitting: Cardiology

## 2016-05-18 LAB — POCT INR: INR: 1.4

## 2016-05-18 NOTE — Telephone Encounter (Signed)
See note from 05/12/16 for additional details.

## 2016-05-19 ENCOUNTER — Ambulatory Visit (INDEPENDENT_AMBULATORY_CARE_PROVIDER_SITE_OTHER): Payer: Medicare Other | Admitting: Pharmacist

## 2016-05-19 DIAGNOSIS — Z5181 Encounter for therapeutic drug level monitoring: Secondary | ICD-10-CM

## 2016-05-19 DIAGNOSIS — Z953 Presence of xenogenic heart valve: Secondary | ICD-10-CM

## 2016-05-21 ENCOUNTER — Other Ambulatory Visit (INDEPENDENT_AMBULATORY_CARE_PROVIDER_SITE_OTHER): Payer: Self-pay | Admitting: Orthopedic Surgery

## 2016-05-22 ENCOUNTER — Encounter (HOSPITAL_COMMUNITY): Payer: Self-pay | Admitting: Emergency Medicine

## 2016-05-22 ENCOUNTER — Emergency Department (HOSPITAL_COMMUNITY): Payer: Medicare Other

## 2016-05-22 ENCOUNTER — Telehealth: Payer: Self-pay | Admitting: Interventional Cardiology

## 2016-05-22 ENCOUNTER — Emergency Department (HOSPITAL_COMMUNITY)
Admission: EM | Admit: 2016-05-22 | Discharge: 2016-05-23 | Disposition: A | Discharge status: Home / Self Care | Payer: Medicare Other | Attending: Emergency Medicine | Admitting: Emergency Medicine

## 2016-05-22 DIAGNOSIS — Y999 Unspecified external cause status: Secondary | ICD-10-CM | POA: Insufficient documentation

## 2016-05-22 DIAGNOSIS — M545 Low back pain: Secondary | ICD-10-CM | POA: Insufficient documentation

## 2016-05-22 DIAGNOSIS — G8918 Other acute postprocedural pain: Secondary | ICD-10-CM

## 2016-05-22 DIAGNOSIS — Y939 Activity, unspecified: Secondary | ICD-10-CM | POA: Diagnosis not present

## 2016-05-22 DIAGNOSIS — Z79899 Other long term (current) drug therapy: Secondary | ICD-10-CM | POA: Diagnosis not present

## 2016-05-22 DIAGNOSIS — M25551 Pain in right hip: Secondary | ICD-10-CM | POA: Diagnosis not present

## 2016-05-22 DIAGNOSIS — R0600 Dyspnea, unspecified: Secondary | ICD-10-CM | POA: Diagnosis not present

## 2016-05-22 DIAGNOSIS — Y9289 Other specified places as the place of occurrence of the external cause: Secondary | ICD-10-CM | POA: Insufficient documentation

## 2016-05-22 DIAGNOSIS — I110 Hypertensive heart disease with heart failure: Secondary | ICD-10-CM | POA: Diagnosis not present

## 2016-05-22 DIAGNOSIS — Z7982 Long term (current) use of aspirin: Secondary | ICD-10-CM | POA: Diagnosis not present

## 2016-05-22 DIAGNOSIS — Z7901 Long term (current) use of anticoagulants: Secondary | ICD-10-CM | POA: Insufficient documentation

## 2016-05-22 DIAGNOSIS — E039 Hypothyroidism, unspecified: Secondary | ICD-10-CM | POA: Diagnosis not present

## 2016-05-22 DIAGNOSIS — Z96611 Presence of right artificial shoulder joint: Secondary | ICD-10-CM | POA: Insufficient documentation

## 2016-05-22 DIAGNOSIS — I5042 Chronic combined systolic (congestive) and diastolic (congestive) heart failure: Secondary | ICD-10-CM | POA: Insufficient documentation

## 2016-05-22 DIAGNOSIS — J45909 Unspecified asthma, uncomplicated: Secondary | ICD-10-CM | POA: Insufficient documentation

## 2016-05-22 DIAGNOSIS — R0789 Other chest pain: Secondary | ICD-10-CM | POA: Diagnosis not present

## 2016-05-22 DIAGNOSIS — W19XXXA Unspecified fall, initial encounter: Secondary | ICD-10-CM

## 2016-05-22 DIAGNOSIS — R079 Chest pain, unspecified: Secondary | ICD-10-CM | POA: Diagnosis present

## 2016-05-22 LAB — CBC
HCT: 35.8 % — ABNORMAL LOW (ref 36.0–46.0)
Hemoglobin: 10.9 g/dL — ABNORMAL LOW (ref 12.0–15.0)
MCH: 26.9 pg (ref 26.0–34.0)
MCHC: 30.4 g/dL (ref 30.0–36.0)
MCV: 88.4 fL (ref 78.0–100.0)
PLATELETS: 348 10*3/uL (ref 150–400)
RBC: 4.05 MIL/uL (ref 3.87–5.11)
RDW: 19.2 % — AB (ref 11.5–15.5)
WBC: 7.6 10*3/uL (ref 4.0–10.5)

## 2016-05-22 LAB — I-STAT TROPONIN, ED
TROPONIN I, POC: 0.01 ng/mL (ref 0.00–0.08)
Troponin i, poc: 0 ng/mL (ref 0.00–0.08)

## 2016-05-22 LAB — BASIC METABOLIC PANEL
Anion gap: 12 (ref 5–15)
BUN: 8 mg/dL (ref 6–20)
CALCIUM: 9.2 mg/dL (ref 8.9–10.3)
CHLORIDE: 103 mmol/L (ref 101–111)
CO2: 24 mmol/L (ref 22–32)
CREATININE: 1.08 mg/dL — AB (ref 0.44–1.00)
GFR calc non Af Amer: 52 mL/min — ABNORMAL LOW (ref >60)
Glucose, Bld: 144 mg/dL — ABNORMAL HIGH (ref 65–99)
Potassium: 3.7 mmol/L (ref 3.5–5.1)
SODIUM: 139 mmol/L (ref 135–145)

## 2016-05-22 LAB — PROTIME-INR
INR: 2.21
PROTHROMBIN TIME: 24.9 s — AB (ref 11.4–15.2)

## 2016-05-22 MED ORDER — WARFARIN SODIUM 3 MG PO TABS: ORAL_TABLET | ORAL | 0 refills | Status: DC

## 2016-05-22 MED ORDER — TIZANIDINE HCL 2 MG PO TABS: 2.0000 mg | ORAL_TABLET | Freq: Two times a day (BID) | ORAL | 0 refills | Status: DC | PRN

## 2016-05-22 MED ORDER — IOPAMIDOL (ISOVUE-370) INJECTION 76%
INTRAVENOUS | Status: AC
  Administered 2016-05-22: 100 mL
  Filled 2016-05-22: qty 100

## 2016-05-22 MED ORDER — MORPHINE SULFATE (PF) 4 MG/ML IV SOLN
4.0000 mg | Freq: Once | INTRAVENOUS | Status: AC
  Administered 2016-05-22: 4 mg via INTRAVENOUS
  Filled 2016-05-22: qty 1

## 2016-05-22 MED ORDER — OXYCODONE HCL 5 MG PO TABS: 5.0000 mg | ORAL_TABLET | ORAL | 0 refills | Status: DC | PRN

## 2016-05-22 NOTE — Discharge Instructions (Signed)
Take tizanidine as needed for muscle spasms.   Take oxycodone as needed for pain   See your doctor  Return to ER if you have severe muscle spasms, chest pain, trouble breathing.

## 2016-05-22 NOTE — Telephone Encounter (Signed)
3 month refill sent in.

## 2016-05-22 NOTE — ED Notes (Signed)
RN attempted IV unsuccessfully.   

## 2016-05-22 NOTE — ED Notes (Signed)
Patient transported to X-ray 

## 2016-05-22 NOTE — ED Notes (Signed)
Nurse will get labs 
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
## 2016-05-22 NOTE — Telephone Encounter (Signed)
New Message   *STAT* If patient is at the pharmacy, call can be transferred to refill team.   1. Which medications need to be refilled? (please list name of each medication and dose if known) Warfarin (Coumadin) 3 mg tablet  2. Which pharmacy/location (including street and city if local pharmacy) is medication to be sent to? [id number] [place],  - [address]  3. Do they need a 30 day or 90 day supply? 90 day supply
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
## 2016-05-22 NOTE — ED Triage Notes (Signed)
Pt sts mid sternal CP and pain with inspiration as well as funny feeling when swallowing x 2 days; pt had CABG in December

## 2016-05-22 NOTE — Telephone Encounter (Signed)
New Message  Pt voiced she does not feel right and feel as if a knot is in her chest.  Pt voiced she does not feel normal and cannot explain and something isn't right.  Please f/u with pt

## 2016-05-23 ENCOUNTER — Ambulatory Visit (INDEPENDENT_AMBULATORY_CARE_PROVIDER_SITE_OTHER): Payer: Medicare Other | Admitting: Pharmacist

## 2016-05-23 ENCOUNTER — Telehealth: Payer: Self-pay | Admitting: Cardiology

## 2016-05-23 DIAGNOSIS — Z953 Presence of xenogenic heart valve: Secondary | ICD-10-CM

## 2016-05-23 DIAGNOSIS — Z5181 Encounter for therapeutic drug level monitoring: Secondary | ICD-10-CM

## 2016-05-23 NOTE — Progress Notes (Signed)
Call patient  Message Contents  Purcell Nails, MD  Levin Bacon, [facility]  Cc: Allayne Butcher, PA-C; Jolee Ewing, RN           Previous Messages    ----- Message -----  From: Joycelyn Schmid, LPN  Sent: 5/52/0802 10:06 AM  To: Purcell Nails, MD, Allayne Butcher, PA-C  Subject: PT/INR level check @ [facility] 05/22/16         Conrad Olivarez, I got a call this AM from [facility] health nurse about Allison [name] PT/INR levels she had been checked on 05/22/16 @ [facility], Her INR 2.21, PT 24.9. She is currently taking Coumadin 3 mg BID. Would you please address this with her today. Thanks Allison [name]

## 2016-05-23 NOTE — Telephone Encounter (Signed)
Follow Up:   Pt wants her last thyroid lab results send to Dr Talmage Nap.

## 2016-05-25 ENCOUNTER — Ambulatory Visit: Payer: Medicare Other | Admitting: Cardiology

## 2016-05-25 ENCOUNTER — Other Ambulatory Visit: Payer: Self-pay

## 2016-05-25 MED ORDER — POTASSIUM CHLORIDE CRYS ER 20 MEQ PO TBCR: 40.0000 meq | EXTENDED_RELEASE_TABLET | Freq: Every day | ORAL | 0 refills | Status: DC

## 2016-05-25 MED ORDER — AMIODARONE HCL 200 MG PO TABS: 200.0000 mg | ORAL_TABLET | Freq: Every day | ORAL | 0 refills | Status: DC

## 2016-05-25 NOTE — Telephone Encounter (Signed)
°*  STAT* If patient is at the pharmacy, call can be transferred to refill team.   1. Which medications need to be refilled? (please list name of each medication and dose if known) amiodarone 200mg  and potassiam cloride 20mg  2. Which pharmacy/location (including street and city if local pharmacy) is medication to be sent to? [facility] [facility]. Do they need a 30 day or 90 day supply? 90

## 2016-05-26 ENCOUNTER — Other Ambulatory Visit: Payer: Self-pay

## 2016-06-01 ENCOUNTER — Ambulatory Visit (INDEPENDENT_AMBULATORY_CARE_PROVIDER_SITE_OTHER): Payer: Medicare Other | Admitting: Cardiology

## 2016-06-01 ENCOUNTER — Encounter: Payer: Self-pay | Admitting: Cardiology

## 2016-06-01 VITALS — BP 120/72 | HR 72 | Ht 68.0 in | Wt 189.0 lb

## 2016-06-01 DIAGNOSIS — I481 Persistent atrial fibrillation: Secondary | ICD-10-CM

## 2016-06-01 DIAGNOSIS — I4819 Other persistent atrial fibrillation: Secondary | ICD-10-CM

## 2016-06-01 NOTE — Progress Notes (Signed)
06/01/2016 Allison [name]   01-30-1951  [id number]  Primary Physician [name], MD Primary Cardiologist: Dr. Eldridge Dace   Reason for Visit/CC: F/u for NICM, Afib and valvular disease   HPI:  Ms. Allison [name] presents to clinic today for f/u. She is a 66 y/o deaf female with multiple issues including HTN, persistent atrial fib, NICM (normal cors 01/2016), chronic combined CHF, thyroid disease, valvular heart disease s/p MV repair, pericardial AVR, and TV repair 03/2016  To recap history, she was initially diagnosed with PAF in seting of hyperthyroidism several years ago. She had return of AF in 2016. She was She was started on Eliquis and underwent successful cardioversion one month later but quickly went back into atrial fibrillation. She was referred to the [facility] F[facility]illation [facility] and started on Flecainide but developed abdominal pain. She was switched to Propafenone and underwent successful cardioversion, but Propafenone was later stopped due to side effects. She was not felt to be a good candidate for Multaq. In September 2017 she developed worsening SOB, palpitations, and orthopnea with recurrent AF RVR. 2D echo 01/2016: EF 20-25%, LBBB, mild-mod AI, mod-severe MR, mild LAE, mild-mod MR. TEE 01/2016: EF 40% with duffse HK, severe central MR due to inadequate leaflet coaptation, moderate AI. LHC 02/18/16: no CAD, elevated PWCP, low cardiac output. Cardiac MRI 03/2016: EF 45%, moderate AI, severe central MR, moderate TR, normal RV size, no definitive evidence for prior MI, infiltrative disease, or myocarditis. On 04/13/16 she underwent mitral valve repair/ring annuloplasty, aortic valve replacement [facility] Ease Pericardial Tissue Valve), tricuspid valve repair/ring annuloplasty, closure of PFO, and MAZE procedure. She was started on Coumadin with goal INR 2.5-3.5 per TCTS. She was discharged to a SNF for rehab, but ha since been discharged home. She was seen by CT surgery for outpatient  f/u on 05/15/16 and was noted to be doing well.   Today in clinic she continues to do well. No chest pain or dyspnea. No further LEE. She has had some confusion regarding her meds. Fortunately, she has a home health nurse assisting her with this. Our comadin clinic follows her INR.   EKG today shows NSR. HR 75 bpm. BP is well controlled.   Current Meds  Medication Sig  . ALPRAZolam (XANAX) 0.25 MG tablet Take 1 tablet (0.25 mg total) by mouth 2 (two) times daily as needed for anxiety.  Marland Kitchen aspirin EC 81 MG EC tablet Take 1 tablet (81 mg total) by mouth daily.  . Calcium Carbonate-Vitamin D (CALTRATE 600+D) 600-400 MG-UNIT per tablet Take 1 tablet by mouth 2 (two) times daily.   . carvedilol (COREG) 12.5 MG tablet Take 12.5 mg by mouth 2 (two) times daily with a meal.  . Cyanocobalamin (VITAMIN B-12) 5000 MCG SUBL Place 1 tablet under the tongue daily. For nerve pain in feet and metabolism support   . diphenhydrAMINE (BENADRYL) 25 mg capsule Take 25 mg by mouth daily as needed for itching.   . ferrous sulfate 325 (65 FE) MG tablet Take 1 tablet (325 mg total) by mouth 2 (two) times daily with a meal. (Patient taking differently: Take 325 mg by mouth daily. )  . furosemide (LASIX) 40 MG tablet 2 TABLET BY MOUTH FOR 5 DAYS AND THEN 1 DAILY OR AS DIRECTED BY DOCTOR (Patient taking differently: Take 40 mg by mouth daily. OR AS DIRECTED BY DOCTOR)  . gabapentin (NEURONTIN) 600 MG tablet TAKE 1 TABLET THREE TIMES A DAY  . levothyroxine (SYNTHROID, LEVOTHROID) 125 MCG tablet Take 125 mcg  by mouth daily before breakfast.  . LORazepam (ATIVAN) 2 MG tablet Take 1 tablet (2 mg total) by mouth at bedtime. For restful sleep  . montelukast (SINGULAIR) 10 MG tablet Take 10 mg by mouth daily before breakfast.   . omeprazole (PRILOSEC) 40 MG capsule Take 40 mg by mouth daily.   Marland Kitchen oxyCODONE (OXY IR/ROXICODONE) 5 MG immediate release tablet Take 1 tablet (5 mg total) by mouth every 4 (four) hours as needed for severe  pain.  . polyvinyl alcohol (LIQUIFILM TEARS) 1.4 % ophthalmic solution Place 1 drop into both eyes daily as needed for dry eyes.   . potassium chloride SA (K-DUR,KLOR-CON) 20 MEQ tablet Take 2 tablets (40 mEq total) by mouth daily. Please keep 06/01/16 appt  . promethazine (PHENERGAN) 25 MG tablet Take 25 mg by mouth daily as needed for nausea or vomiting.  Marland Kitchen tiZANidine (ZANAFLEX) 2 MG tablet Take 1 tablet (2 mg total) by mouth 2 (two) times daily as needed for muscle spasms.  . traMADol (ULTRAM) 50 MG tablet Take 50 mg by mouth every 6 (six) hours as needed for moderate pain.   Marland Kitchen warfarin (COUMADIN) 3 MG tablet Take as directed by the Coumadin Clinic. (Patient taking differently: Take 3 mg by mouth 2 (two) times daily. or as directed by the Coumadin Clinic.)   Allergies  Allergen Reactions  . Biaxin [Clarithromycin] Nausea Only and Other (See Comments)    Dizzy, blurred vision, achy stomach  . Levaquin [Levofloxacin] Nausea Only and Other (See Comments)    Dizzy, achy stomach, can't sleep  . Trazodone And Nefazodone Other (See Comments)    Weakness in legs Numbness in arms Funny feeling Rapid heart beat Lose focus  . Sulfa Antibiotics Other (See Comments)    Blister on large toe  . Aleve [Naproxen] Nausea And Vomiting and Other (See Comments)    Stomach ache  . Bextra [Valdecoxib] Nausea And Vomiting and Other (See Comments)    Stomach ache   . Doxycycline Nausea Only and Other (See Comments)    Stomach ache  . Durabac [Apap-Salicyl-Phenyltolox-Caff] Nausea And Vomiting  . Estradiol Nausea Only and Other (See Comments)    Hurt stomach  . Macrobid [Nitrofurantoin] Other (See Comments)    BLOATING  . Motrin [Ibuprofen] Nausea Only and Other (See Comments)    Stomach ahce  . Oxaprozin Nausea Only and Other (See Comments)    Hurt stomach  . Robaxin [Methocarbamol] Nausea Only and Other (See Comments)    Hurt stomach  . Topamax [Topiramate] Nausea And Vomiting   Past Medical  History:  Diagnosis Date  . AC (acromioclavicular) joint bone spurs    spurs in shoulders with pain  . Anxiety   . Aortic insufficiency   . Arthritis    all over  . Asthma    from reflux  . Chronic combined systolic and diastolic CHF (congestive heart failure) (HCC)   . Deafness   . Fibromyalgia   . GERD (gastroesophageal reflux disease)   . Hypertension   . Hypothyroidism   . Mitral regurgitation   . Non-ischemic cardiomyopathy (HCC)    a. Normal cors 01/2016.  Marland Kitchen Persistent atrial fibrillation (HCC)   . PFO (patent foramen ovale)    a. closure 03/2016.  . S/P aortic valve replacement with bioprosthetic valve 04/13/2016   23 mm [facility] Ease bovine pericardial tissue valve  . S/P mitral valve repair 04/13/2016   26 mm Sorin Memo 3D ring annuloplasty  . S/P tricuspid valve repair  04/13/2016   26 mm Edwards mc3 ring annuloplasty  . Tricuspid regurgitation    Family History  Problem Relation Age of Onset  . Lung cancer Father   . Arrhythmia Father   . Hypertension Father   . Arrhythmia Mother   . Hypertension Mother   . Stroke Maternal Grandmother   . Hypertension Maternal Grandmother   . Heart attack Maternal Uncle     X2  . Kidney disease Paternal Grandfather    Past Surgical History:  Procedure Laterality Date  . ABDOMINAL HYSTERECTOMY     partial  . AORTIC VALVE REPLACEMENT N/A 04/13/2016   Procedure: AORTIC VALVE REPLACEMENT (AVR) implanted with Magna Ease size 35mm;  Surgeon: Purcell Nails, MD;  Location: [facility] OR;  Service: Open Heart Surgery;  Laterality: N/A;  . CARDIAC CATHETERIZATION N/A 02/18/2016   Procedure: Right/Left Heart Cath and Coronary Angiography;  Surgeon: Laurey Morale, MD;  Location: [facility] INVASIVE CV LAB;  Service: Cardiovascular;  Laterality: N/A;  . CARDIOVERSION N/A 10/16/2014   Procedure: CARDIOVERSION;  Surgeon: Corky Crafts, MD;  Location: [facility] ENDOSCOPY;  Service: Cardiovascular;  Laterality: N/A;  . CARDIOVERSION N/A 12/04/2014     Procedure: CARDIOVERSION;  Surgeon: Thurmon Fair, MD;  Location: [facility] ENDOSCOPY;  Service: Cardiovascular;  Laterality: N/A;  . COLON SURGERY     spurs  . COLONOSCOPY  [id number]  . COLONOSCOPY WITH PROPOFOL  04/09/2012   Procedure: COLONOSCOPY WITH PROPOFOL;  Surgeon: Charolett Bumpers, MD;  Location: [facility] ENDOSCOPY;  Service: Endoscopy;  Laterality: N/A;  . ESOPHAGOGASTRODUODENOSCOPY ENDOSCOPY     several times  . EYE SURGERY     catarcts  . MAZE N/A 04/13/2016   Procedure: MAZE;  Surgeon: Purcell Nails, MD;  Location: [facility] OR;  Service: Open Heart Surgery;  Laterality: N/A;  . MITRAL VALVE REPAIR N/A 04/13/2016   Procedure: MITRAL VALVE REPAIR (MVR) using 93mm Sorin /MEMO 3D annuloplasty ring;  Surgeon: Purcell Nails, MD;  Location: [facility] OR;  Service: Open Heart Surgery;  Laterality: N/A;  . PATENT FORAMEN OVALE CLOSURE N/A 04/13/2016   Procedure: PATENT FORAMEN OVALE (PFO) CLOSURE;  Surgeon: Purcell Nails, MD;  Location: [facility] OR;  Service: Open Heart Surgery;  Laterality: N/A;  . TEE WITHOUT CARDIOVERSION N/A 02/18/2016   Procedure: TRANSESOPHAGEAL ECHOCARDIOGRAM (TEE);  Surgeon: Laurey Morale, MD;  Location: [facility] ENDOSCOPY;  Service: Cardiovascular;  Laterality: N/A;  . TEE WITHOUT CARDIOVERSION N/A 04/13/2016   Procedure: TRANSESOPHAGEAL ECHOCARDIOGRAM (TEE);  Surgeon: Purcell Nails, MD;  Location: [facility] OR;  Service: Open Heart Surgery;  Laterality: N/A;  . THYROID SURGERY  1991  . TONSILLECTOMY    . TOTAL SHOULDER ARTHROPLASTY Right 07/02/2012   Procedure: TOTAL SHOULDER ARTHROPLASTY;  Surgeon: Mable Paris, MD;  Location: [facility] OR;  Service: Orthopedics;  Laterality: Right;  Right total shoulder arthroplasty  . TRICUSPID VALVE REPLACEMENT N/A 04/13/2016   Procedure: TRICUSPID VALVE REPAIR using a T26 MC3 Edwards annuloplasty ring;  Surgeon: Purcell Nails, MD;  Location: [facility] OR;  Service: Open Heart Surgery;  Laterality: N/A;  . TUBAL LIGATION    . vericose surgery Right leg   05-1998  . WISDOM TOOTH EXTRACTION     Social History   Social History  . Marital status: Married    Spouse name: N/A  . Number of children: N/A  . Years of education: N/A   Occupational History  . Not on file.   Social History Main Topics  . Smoking status: Never Smoker  .  Smokeless tobacco: Never Used  . Alcohol use No  . Drug use: No  . Sexual activity: Not on file   Other Topics Concern  . Not on file   Social History Narrative  . No narrative on file     Review of Systems: General: negative for chills, fever, night sweats or weight changes.  Cardiovascular: negative for chest pain, dyspnea on exertion, edema, orthopnea, palpitations, paroxysmal nocturnal dyspnea or shortness of breath Dermatological: negative for rash Respiratory: negative for cough or wheezing Urologic: negative for hematuria Abdominal: negative for nausea, vomiting, diarrhea, bright red blood per rectum, melena, or hematemesis Neurologic: negative for visual changes, syncope, or dizziness All other systems reviewed and are otherwise negative except as noted above.   Physical Exam:  Blood pressure 120/72, pulse 72, height 5\' 8"  (1.727 m), weight 189 lb (85.7 kg).  General appearance: alert, cooperative and no distress Neck: no carotid bruit and no JVD Lungs: clear to auscultation bilaterally Heart: regular rate and rhythm, S1, S2 normal, no murmur, click, rub or gallop Extremities: no LEE Pulses: 2+ and symmetric Skin: Skin color, texture, turgor normal. No rashes or lesions Neurologic: Grossly normal  EKG NSR. 75 bpm   ASSESSMENT AND PLAN:   1. Valvular Heart Disease: s/p mitral valve repair/ring annuloplasty, aortic valve replacement (Edwards Magna Ease Pericardial Tissue Valve), tricuspid valve repair/ring annuloplasty, closure of PFO, and MAZE procedure. She was started on Coumadin with goal INR 2.5-3.5 per TCTS. She is stable w/o CP or dyspnea.   2. H/o Atrial Fibrillation: s/p Maze  procedure during valve surgery. EKG today shows NSR. HR is controlled with Coreg.  3. NICM: EF 20-25%, LBBB, mild-mod AI, mod-severe MR, mild LAE, mild-mod MR. TEE 01/2016: EF 40% with duffse HK, severe central MR due to inadequate leaflet coaptation, moderate AI. LHC 02/18/16: no CAD, elevated PWCP, low cardiac output. Cardiac MRI 03/2016: EF 45%, moderate AI, severe central MR, moderate TR, normal RV size, no definitive evidence for prior MI, infiltrative disease, or myocarditis. She is stable w/o dyspnea. No orthopnea, PND or LEE. Volume is stable. Continue medical therapy.   4. Chronic Coumadin: INRs are followed in our coumadin clinic. She denies any abnormal bleeding. No falls.   PLAN f/u with Dr. Eldridge Dace in 3-4 months    Knute Neu 06/01/2016 4:52 PM

## 2016-06-01 NOTE — Patient Instructions (Signed)
Medication Instructions:  Your physician recommends that you continue on your current medications as directed. Please refer to the Current Medication list given to you today.   Labwork: NONE  Testing/Procedures: NONE  Follow-Up: Your physician recommends that you schedule a follow-up appointment in: 3-4 MONTHS WITH Dr. Eldridge Dace.   Any Other Special Instructions Will Be Listed Below (If Applicable).     If you need a refill on your cardiac medications before your next appointment, please call your pharmacy.

## 2016-06-03 ENCOUNTER — Other Ambulatory Visit (HOSPITAL_COMMUNITY): Payer: Self-pay | Admitting: Internal Medicine

## 2016-06-05 ENCOUNTER — Ambulatory Visit (INDEPENDENT_AMBULATORY_CARE_PROVIDER_SITE_OTHER): Payer: Medicare Other | Admitting: Internal Medicine

## 2016-06-05 DIAGNOSIS — Z5181 Encounter for therapeutic drug level monitoring: Secondary | ICD-10-CM

## 2016-06-05 DIAGNOSIS — Z953 Presence of xenogenic heart valve: Secondary | ICD-10-CM

## 2016-06-05 LAB — PROTIME-INR: INR: 7.7 — AB (ref 0.9–1.1)

## 2016-06-08 ENCOUNTER — Ambulatory Visit (INDEPENDENT_AMBULATORY_CARE_PROVIDER_SITE_OTHER): Payer: Medicare Other | Admitting: *Deleted

## 2016-06-08 DIAGNOSIS — Z953 Presence of xenogenic heart valve: Secondary | ICD-10-CM

## 2016-06-08 DIAGNOSIS — Z5181 Encounter for therapeutic drug level monitoring: Secondary | ICD-10-CM

## 2016-06-08 LAB — POCT INR: INR: 4

## 2016-06-14 ENCOUNTER — Ambulatory Visit (INDEPENDENT_AMBULATORY_CARE_PROVIDER_SITE_OTHER): Payer: Medicare Other | Admitting: Cardiology

## 2016-06-14 DIAGNOSIS — I481 Persistent atrial fibrillation: Secondary | ICD-10-CM

## 2016-06-14 DIAGNOSIS — Z953 Presence of xenogenic heart valve: Secondary | ICD-10-CM

## 2016-06-14 DIAGNOSIS — Z5181 Encounter for therapeutic drug level monitoring: Secondary | ICD-10-CM

## 2016-06-14 DIAGNOSIS — I4819 Other persistent atrial fibrillation: Secondary | ICD-10-CM

## 2016-06-14 LAB — PROTIME-INR: INR: 2.8 — AB (ref 0.9–1.1)

## 2016-06-20 ENCOUNTER — Other Ambulatory Visit: Payer: Self-pay | Admitting: Surgery

## 2016-06-20 DIAGNOSIS — E041 Nontoxic single thyroid nodule: Secondary | ICD-10-CM

## 2016-06-21 ENCOUNTER — Ambulatory Visit (INDEPENDENT_AMBULATORY_CARE_PROVIDER_SITE_OTHER): Payer: Medicare Other | Admitting: Cardiovascular Disease

## 2016-06-21 ENCOUNTER — Telehealth: Payer: Self-pay | Admitting: Interventional Cardiology

## 2016-06-21 DIAGNOSIS — Z953 Presence of xenogenic heart valve: Secondary | ICD-10-CM

## 2016-06-21 DIAGNOSIS — I4819 Other persistent atrial fibrillation: Secondary | ICD-10-CM

## 2016-06-21 DIAGNOSIS — Z5181 Encounter for therapeutic drug level monitoring: Secondary | ICD-10-CM

## 2016-06-21 DIAGNOSIS — I481 Persistent atrial fibrillation: Secondary | ICD-10-CM

## 2016-06-21 LAB — PROTIME-INR: INR: 4.9 — AB (ref 0.9–1.1)

## 2016-06-21 NOTE — Telephone Encounter (Signed)
New message    Message for nurse from [place] with [facility] [phone]:  Pt is discharged and no longer receiving services from [facility]. Advised Enrique Sack that Larita Fife is not available and is OFF - Enrique Sack stated this just needs to be documented by anyone in triage.

## 2016-06-23 ENCOUNTER — Ambulatory Visit
Admission: RE | Admit: 2016-06-23 | Discharge: 2016-06-23 | Disposition: A | Discharge status: Home / Self Care | Payer: Medicare Other | Source: Ambulatory Visit | Attending: Surgery | Admitting: Surgery

## 2016-06-23 DIAGNOSIS — E041 Nontoxic single thyroid nodule: Secondary | ICD-10-CM

## 2016-06-28 NOTE — Telephone Encounter (Signed)
folllow up   Home health rn Toniann Fail verbalized that she has not received any orders

## 2016-06-30 ENCOUNTER — Ambulatory Visit (INDEPENDENT_AMBULATORY_CARE_PROVIDER_SITE_OTHER): Payer: Medicare Other | Admitting: Internal Medicine

## 2016-06-30 ENCOUNTER — Telehealth: Payer: Self-pay | Admitting: *Deleted

## 2016-06-30 DIAGNOSIS — I4819 Other persistent atrial fibrillation: Secondary | ICD-10-CM

## 2016-06-30 DIAGNOSIS — Z953 Presence of xenogenic heart valve: Secondary | ICD-10-CM

## 2016-06-30 DIAGNOSIS — Z5181 Encounter for therapeutic drug level monitoring: Secondary | ICD-10-CM

## 2016-06-30 DIAGNOSIS — I481 Persistent atrial fibrillation: Secondary | ICD-10-CM

## 2016-06-30 LAB — PROTIME-INR: INR: 4.7 — AB (ref 0.9–1.1)

## 2016-06-30 NOTE — Telephone Encounter (Signed)
Received a call from the lab at [facility] regarding the pt because the pt showed up at the hospital looking for the Home Health dept. She stated that the pt was in there lab inquiring about getting an INR checked since Home Health did not show up & complete an INR on Wednesday like they were supposed to do so. Therefore, she asked if I could fax an order over to them to get the labs done since she is there and I faxed over an order to the lab [phone] to obtain STAT. Also, instructed her to let pt know we have tried to call her. The pt is deaf so the communication was not fluent.  The pt was taken to the lab to have her labs drawn and they will send as requested & we will follow up when labs return.   At 3pm labs returned via fax & interpreter & pt called & please se anticoagulation track for more details.

## 2016-06-30 NOTE — Telephone Encounter (Signed)
Spoke with Toniann Fail who is HH RN with [facility] regarding pt not having an INR drawn on 06/28/16.  She stated she was off work today but called to see the reason she had not received recheck orders & she gave me the office number.  Therefore, I called [facility] office number & spoke with Enrique Sack.  Enrique Sack stated the pt had been d/c them stated no she is not, then she place me on hold for 10 minutes to speak with case manager.  When she returned to the phone she stated the pt was discharged from their services on 06/21/16 & she Enrique Sack) had called back to the min number & asked to speak with Tiffany but was told to leave a msg & she did.  After assessing the chart the msg was never sent to Coumadin Clinic to make aware of discharge, also, 1 week later she [facility] RN called inquiring about when the pt's INR is due. Explained to Enrique Sack that this is a delay in care & now we have to call the pt to have her to come into our office in [place] on short notice.  Called the pt to inform of this but had to leave a message for the pt to call back when she got the msg.

## 2016-07-01 ENCOUNTER — Other Ambulatory Visit: Payer: Self-pay | Admitting: Physician Assistant

## 2016-07-01 DIAGNOSIS — Z9889 Other specified postprocedural states: Secondary | ICD-10-CM

## 2016-07-01 DIAGNOSIS — I5043 Acute on chronic combined systolic (congestive) and diastolic (congestive) heart failure: Secondary | ICD-10-CM

## 2016-07-01 DIAGNOSIS — I447 Left bundle-branch block, unspecified: Secondary | ICD-10-CM

## 2016-07-01 DIAGNOSIS — R0602 Shortness of breath: Secondary | ICD-10-CM

## 2016-07-01 DIAGNOSIS — Z79899 Other long term (current) drug therapy: Secondary | ICD-10-CM

## 2016-07-01 DIAGNOSIS — I4819 Other persistent atrial fibrillation: Secondary | ICD-10-CM

## 2016-07-01 DIAGNOSIS — I428 Other cardiomyopathies: Secondary | ICD-10-CM

## 2016-07-07 ENCOUNTER — Ambulatory Visit (INDEPENDENT_AMBULATORY_CARE_PROVIDER_SITE_OTHER): Payer: Medicare Other | Admitting: Pharmacist

## 2016-07-07 DIAGNOSIS — I481 Persistent atrial fibrillation: Secondary | ICD-10-CM | POA: Diagnosis not present

## 2016-07-07 DIAGNOSIS — Z5181 Encounter for therapeutic drug level monitoring: Secondary | ICD-10-CM | POA: Diagnosis not present

## 2016-07-07 DIAGNOSIS — Z953 Presence of xenogenic heart valve: Secondary | ICD-10-CM | POA: Diagnosis not present

## 2016-07-07 DIAGNOSIS — I4819 Other persistent atrial fibrillation: Secondary | ICD-10-CM

## 2016-07-07 LAB — POCT INR: INR: 3.8

## 2016-07-13 ENCOUNTER — Other Ambulatory Visit (HOSPITAL_COMMUNITY): Payer: Self-pay | Admitting: Internal Medicine

## 2016-07-13 NOTE — Telephone Encounter (Signed)
There is no documentation in the pateints chart that they are supposed to be taking Carvedilol 12.5 BID. Per hospital discharge she was to stop Carvedilol 12.5 Mg and all office notes after the patient was taking Carvedilol 3.125 mg BID. Please review and advise

## 2016-07-13 NOTE — Telephone Encounter (Signed)
The patient was seen by Robbie Lis, PA on 06/01/16. There were duplicate orders for this medication at the OV. Brittainy Sharol Harness wanted to keep the 12.5 mg BID order and discontinue the 3.125 mg BID order. Per the OV with Robbie Lis, PA on 06/01/16 the patient should be taking carvedilol 12.5 mg BID.

## 2016-07-14 ENCOUNTER — Other Ambulatory Visit: Payer: Self-pay | Admitting: Thoracic Surgery (Cardiothoracic Vascular Surgery)

## 2016-07-14 DIAGNOSIS — Z9889 Other specified postprocedural states: Secondary | ICD-10-CM

## 2016-07-17 ENCOUNTER — Ambulatory Visit (INDEPENDENT_AMBULATORY_CARE_PROVIDER_SITE_OTHER): Payer: Medicare Other | Admitting: *Deleted

## 2016-07-17 ENCOUNTER — Encounter: Payer: Self-pay | Admitting: Thoracic Surgery (Cardiothoracic Vascular Surgery)

## 2016-07-17 ENCOUNTER — Ambulatory Visit (INDEPENDENT_AMBULATORY_CARE_PROVIDER_SITE_OTHER): Payer: Medicare Other | Admitting: Thoracic Surgery (Cardiothoracic Vascular Surgery)

## 2016-07-17 VITALS — BP 125/83 | HR 76 | Resp 20 | Ht 68.0 in | Wt 190.0 lb

## 2016-07-17 DIAGNOSIS — Z953 Presence of xenogenic heart valve: Secondary | ICD-10-CM

## 2016-07-17 DIAGNOSIS — Z8679 Personal history of other diseases of the circulatory system: Secondary | ICD-10-CM

## 2016-07-17 DIAGNOSIS — I481 Persistent atrial fibrillation: Secondary | ICD-10-CM

## 2016-07-17 DIAGNOSIS — Z5181 Encounter for therapeutic drug level monitoring: Secondary | ICD-10-CM | POA: Diagnosis not present

## 2016-07-17 DIAGNOSIS — Z9889 Other specified postprocedural states: Secondary | ICD-10-CM

## 2016-07-17 DIAGNOSIS — I4819 Other persistent atrial fibrillation: Secondary | ICD-10-CM

## 2016-07-17 LAB — PROTIME-INR
INR: 5.9 — ABNORMAL HIGH (ref 0.8–1.2)
PROTHROMBIN TIME: 58.8 s — AB (ref 9.1–12.0)

## 2016-07-17 LAB — POCT INR: INR: 6.6

## 2016-07-17 MED ORDER — CARVEDILOL 12.5 MG PO TABS: 12.5000 mg | ORAL_TABLET | Freq: Two times a day (BID) | ORAL | 1 refills | Status: DC

## 2016-07-17 NOTE — Patient Instructions (Addendum)
Continue all previous medications without any changes at this time  You may discuss with your cardiologist whether or not to remain on Coumadin (warfarin) or to switch back to Eliquis  You may resume unrestricted physical activity without any particular limitations at this time.  Endocarditis is a potentially serious infection of heart valves or inside lining of the heart.  It occurs more commonly in patients with diseased heart valves (such as patient's with aortic or mitral valve disease) and in patients who have undergone heart valve repair or replacement.  Certain surgical and dental procedures may put you at risk, such as dental cleaning, other dental procedures, or any surgery involving the respiratory, urinary, gastrointestinal tract, gallbladder or prostate gland.   To minimize your chances for develooping endocarditis, maintain good oral health and seek prompt medical attention for any infections involving the mouth, teeth, gums, skin or urinary tract.    Always notify your doctor or dentist about your underlying heart valve condition before having any invasive procedures. You will need to take antibiotics before certain procedures, including all routine dental cleanings or other dental procedures.  Your cardiologist or dentist should prescribe these antibiotics for you to be taken ahead of time.

## 2016-07-17 NOTE — Progress Notes (Signed)
[address]       [name] [address]             [phone]     CARDIOTHORACIC SURGERY OFFICE NOTE  Referring Provider is Laurey Morale, MD  Primary Cardiologist is Corky Crafts, MD PCP is [name], MD   HPI:  Patient is a 66 year old female with history of hypertension, long-standing persistent atrial fibrillation, nonischemic cardiomyopathy, and chronic combined systolic and diastolic congestive heart failure who returns to the office today for routine follow-up status post aortic valve replacement using a bioprosthetic tissue valve, mitral valve repair, tricuspid valve repair, closure of patent foramen ovale, and Maze procedure on 04/13/2016.  Her early postoperative recovery in the hospital was uneventful and she was discharged on the seventh postoperative day.  She was last seen in our office on 05/15/2016 at which time she was doing well.  Since then she was seen in follow-up by Robbie Lis at [facility] on 06/01/2016 at which time she was doing very well and maintaining sinus rhythm.  Since then she has continued to do exceptionally well. She states that she did develop an upper respiratory tract infection 2 weeks ago and was seen in urgent care in [place]. She was given a prescription for a short course of oral antibiotics. She does not recall what type of antibiotic it wasn't too did not bring the bottle with her. Of note, her INR was quite high today when it was checked at the Coumadin clinic.  Her symptoms from her upper respiratory tract infection apparently resolved completely and she states that she feels very well. She denies any fevers chills or productive cough. She reports no shortness of breath and in fact she states that her breathing is dramatically better than it was prior to surgery. She is eager to get out into her garden and begin planting for the spring. She denies any palpitations or chest discomfort. The remainder of her  review of systems is unremarkable.   Current Outpatient Prescriptions  Medication Sig Dispense Refill  . ALPRAZolam (XANAX) 0.25 MG tablet Take 1 tablet (0.25 mg total) by mouth 2 (two) times daily as needed for anxiety. 10 tablet 0  . aspirin EC 81 MG EC tablet Take 1 tablet (81 mg total) by mouth daily.    . Calcium Carbonate-Vitamin D (CALTRATE 600+D) 600-400 MG-UNIT per tablet Take 1 tablet by mouth 2 (two) times daily.     . carvedilol (COREG) 12.5 MG tablet Take 12.5 mg by mouth 2 (two) times daily with a meal.    . Cyanocobalamin (VITAMIN B-12) 5000 MCG SUBL Place 1 tablet under the tongue daily. For nerve pain in feet and metabolism support     . diphenhydrAMINE (BENADRYL) 25 mg capsule Take 25 mg by mouth daily as needed for itching.     . ferrous sulfate 325 (65 FE) MG tablet Take 1 tablet (325 mg total) by mouth 2 (two) times daily with a meal. (Patient taking differently: Take 325 mg by mouth daily. ) 180 tablet 3  . furosemide (LASIX) 40 MG tablet Take 1 tablet (40 mg total) by mouth daily. 30 tablet 11  . gabapentin (NEURONTIN) 600 MG tablet TAKE 1 TABLET THREE TIMES A DAY 90 tablet 5  . levothyroxine (SYNTHROID, LEVOTHROID) 125 MCG tablet Take 125 mcg by mouth daily before breakfast.    . LORazepam (ATIVAN) 2 MG tablet Take 1 tablet (2 mg total) by mouth at bedtime. For  restful sleep 10 tablet 0  . montelukast (SINGULAIR) 10 MG tablet Take 10 mg by mouth daily before breakfast.     . omeprazole (PRILOSEC) 40 MG capsule Take 40 mg by mouth daily.     Marland Kitchen oxyCODONE (OXY IR/ROXICODONE) 5 MG immediate release tablet Take 1 tablet (5 mg total) by mouth every 4 (four) hours as needed for severe pain. 10 tablet 0  . polyvinyl alcohol (LIQUIFILM TEARS) 1.4 % ophthalmic solution Place 1 drop into both eyes daily as needed for dry eyes.     . potassium chloride SA (K-DUR,KLOR-CON) 20 MEQ tablet Take 2 tablets (40 mEq total) by mouth daily. Please keep 06/01/16 appt 60 tablet 0  . promethazine  (PHENERGAN) 25 MG tablet Take 25 mg by mouth daily as needed for nausea or vomiting.    Marland Kitchen tiZANidine (ZANAFLEX) 2 MG tablet Take 1 tablet (2 mg total) by mouth 2 (two) times daily as needed for muscle spasms. 10 tablet 0  . traMADol (ULTRAM) 50 MG tablet Take 50 mg by mouth every 6 (six) hours as needed for moderate pain.     Marland Kitchen warfarin (COUMADIN) 3 MG tablet Take as directed by the Coumadin Clinic. (Patient taking differently: Take 3 mg by mouth 2 (two) times daily. or as directed by the Coumadin Clinic.) 190 tablet 0   No current facility-administered medications for this visit.       Physical Exam:   BP 125/83   Pulse 76   Resp 20   Ht 5\' 8"  (1.727 m)   Wt 190 lb (86.2 kg)   SpO2 98%   BMI 28.89 kg/m   General:  Well-appearing  Chest:   Clear to auscultation  CV:   Regular rate and rhythm without murmur  Incisions:  Completely healed, sternum is stable  Abdomen:  Soft and nontender  Extremities:  Warm and well-perfused  Diagnostic Tests:  2 channel telemetry rhythm strip demonstrates normal sinus rhythm    Transthoracic Echocardiography  Patient:    Allison [name], Level MR #:       [id number] Study Date: 05/11/2016 Gender:     F Age:        59 Height:     170.2 cm Weight:     90.3 kg BSA:        2.09 m^2 Pt. Status: Room:   SONOGRAPHER  Dewitt Hoes, RDCS  ATTENDING    Dunn, Dayna N  ORDERING     Dunn, Dayna N  REFERRING    Dunn, Dayna N  PERFORMING   [facility], Outpatient  cc:  ------------------------------------------------------------------- LV EF: 50% -   55%  ------------------------------------------------------------------- Indications:      CHF (I50.41).  ------------------------------------------------------------------- History:   PMH:   Atrial fibrillation.  Congestive heart failure. Hypertrophic cardiomyopathy.  Aortic valve disease.  Mitral valve disease.  Risk factors:   LBBB.  ------------------------------------------------------------------- Study Conclusions  - Left ventricle: The cavity size was normal. Wall thickness was   normal. Systolic function was normal. The estimated ejection   fraction was in the range of 50% to 55%. Wall motion was normal;   there were no regional wall motion abnormalities. - Aortic valve: A bioprosthesis was present. - Mitral valve: A bioprosthesis was present. - Left atrium: The atrium was moderately dilated. - Tricuspid valve: Prior procedures included surgical repair.  ------------------------------------------------------------------- Labs, prior tests, procedures, and surgery: Transthoracic echocardiography (02/01/2016).    The mitral valve showed moderate to severe regurgitation. The tricuspid valve showed mild to moderate  regurgitation. The aortic valve showed mild to moderate regurgitation.  EF was 20%.  Valve surgery.     Mitral valve repair.  Aortic valve replacement with a bioprosthetic valve.  Tricuspid valve repair. Surgery.    Maze procedure.  ------------------------------------------------------------------- Study data:  Comparison was made to the study of 02/01/2016.  Study status:  Routine.  Procedure:  The patient reported no pain pre or post test. Transthoracic echocardiography. Image quality was adequate.  Study completion:  There were no complications. Transthoracic echocardiography.  M-mode, complete 2D, 3D, spectral Doppler, and color Doppler.  Birthdate:  Patient birthdate: 03-28-1951.  Age:  Patient is 66 yr old.  Sex:  Gender: female. BMI: 31.2 kg/m^2.  Blood pressure:     112/56  Patient status: Outpatient.  Study date:  Study date: 05/11/2016. Study time: 12:53 PM.  Location:  [facility]  -------------------------------------------------------------------  ------------------------------------------------------------------- Left ventricle:  The cavity size was normal.  Wall thickness was normal. Systolic function was normal. The estimated ejection fraction was in the range of 50% to 55%. Wall motion was normal; there were no regional wall motion abnormalities.  ------------------------------------------------------------------- Aortic valve:  A bioprosthesis was present.  Doppler:   There was no stenosis.   There was no regurgitation.    VTI ratio of LVOT to aortic valve: 0.48. Peak velocity ratio of LVOT to aortic valve: 0.45. Mean velocity ratio of LVOT to aortic valve: 0.42.    Mean gradient (S): 14 mm Hg. Peak gradient (S): 26 mm Hg.  ------------------------------------------------------------------- Aorta:  Aortic root: The aortic root was normal in size. Ascending aorta: The ascending aorta was normal in size.  ------------------------------------------------------------------- Mitral valve:  There is a mild - moderate gradient across the prosthetic MV. A bioprosthesis was present.  Doppler:  There was no regurgitation.    Valve area by pressure half-time: 2.12 cm^2. Indexed valve area by pressure half-time: 1.01 cm^2/m^2.    Mean gradient (D): 7 mm Hg. Peak gradient (D): 16 mm Hg.  ------------------------------------------------------------------- Left atrium:  The atrium was moderately dilated.  ------------------------------------------------------------------- Right ventricle:  The cavity size was normal. Systolic function was normal.  ------------------------------------------------------------------- Pulmonic valve:    The valve appears to be grossly normal. Doppler:  There was no significant regurgitation.  ------------------------------------------------------------------- Tricuspid valve:  Prior procedures included surgical repair. Doppler:  There was no significant regurgitation.    Mean gradient (D): 3 mm Hg.  ------------------------------------------------------------------- Right atrium:  The atrium was normal  in size.  ------------------------------------------------------------------- Pericardium:  There was no pericardial effusion.  ------------------------------------------------------------------- Measurements   Left ventricle                            Value          Reference  LV ID, ED, PLAX chordal                   44.7  mm       43 - 52  LV ID, ES, PLAX chordal                   37.5  mm       23 - 38  LV fx shortening, PLAX chordal    (L)     16    %        >=29  LV PW thickness, ED  10.2  mm       ---------  IVS/LV PW ratio, ED                       1.08           <=1.3  LV e&', lateral                            11.1  cm/s     ---------  LV E/e&', lateral                          18.02          ---------  LV e&', medial                             5.43  cm/s     ---------  LV E/e&', medial                           36.83          ---------  LV e&', average                            8.27  cm/s     ---------  LV E/e&', average                          24.2           ---------    Ventricular septum                        Value          Reference  IVS thickness, ED                         11    mm       ---------    LVOT                                      Value          Reference  LVOT peak velocity, S                     115   cm/s     ---------  LVOT mean velocity, S                     73.5  cm/s     ---------  LVOT VTI, S                               24.6  cm       ---------  LVOT peak gradient, S                     5     mm Hg    ---------    Aortic valve  Value          Reference  Aortic valve peak velocity, S             255   cm/s     ---------  Aortic valve mean velocity, S             173   cm/s     ---------  Aortic valve VTI, S                       51.2  cm       ---------  Aortic mean gradient, S                   14    mm Hg    ---------  Aortic peak gradient, S                   26    mm Hg    ---------   VTI ratio, LVOT/AV                        0.48           ---------  Velocity ratio, peak, LVOT/AV             0.45           ---------  Velocity ratio, mean, LVOT/AV             0.42           ---------    Aorta                                     Value          Reference  Aortic root ID, ED                        33    mm       ---------    Left atrium                               Value          Reference  LA ID, A-P, ES                            49    mm       ---------  LA ID/bsa, A-P                    (H)     2.34  cm/m^2   <=2.2  LA volume, S                              47.7  ml       ---------  LA volume/bsa, S                          22.8  ml/m^2   ---------  LA volume, ES, 1-p A4C                    45.3  ml       ---------  LA volume/bsa, ES, 1-p A4C  21.6  ml/m^2   ---------  LA volume, ES, 1-p A2C                    49.3  ml       ---------  LA volume/bsa, ES, 1-p A2C                23.5  ml/m^2   ---------    Mitral valve                              Value          Reference  Mitral E-wave peak velocity               200   cm/s     ---------  Mitral A-wave peak velocity               142   cm/s     ---------  Mitral mean velocity, D                   124   cm/s     ---------  Mitral deceleration time          (H)     437   ms       150 - 230  Mitral pressure half-time                 104   ms       ---------  Mitral mean gradient, D                   7     mm Hg    ---------  Mitral peak gradient, D                   16    mm Hg    ---------  Mitral E/A ratio, peak                    1.4            ---------  Mitral valve area, PHT, DP                2.12  cm^2     ---------  Mitral valve area/bsa, PHT, DP            1.01  cm^2/m^2 ---------  Mitral annulus VTI, D                     59.7  cm       ---------    Tricuspid valve                           Value          Reference  Tricuspid mean velocity, D                79.2  cm/s     ---------  Tricuspid  mean gradient, D                3     mm Hg    ---------  Tricuspid maximal inflow                  127   cm/s     ---------  velocity, PISA  Tricuspid VTI at annulus, D  366   mm       ---------    Systemic veins                            Value          Reference  Estimated CVP                             3     mm Hg    ---------    Right ventricle                           Value          Reference  TAPSE                                     11.7  mm       ---------  RV s&', lateral, S                         5.12  cm/s     ---------  Legend: (L)  and  (H)  mark values outside specified reference range.  ------------------------------------------------------------------- Prepared and Electronically Authenticated by  Kristeen Miss, M.D. 2018-01-11T15:21:49    Impression:  Patient is doing very well more than 3 months status post aortic valve replacement using a bioprosthetic tissue valve, mitral valve repair, tricuspid valve repair, and Maze procedure. She denies any symptoms of exertional shortness of breath or palpitations and she happily proclaims that she feels much better than she did prior to surgery. She is maintaining sinus rhythm.  Follow-up echocardiogram performed in January revealed normal left ventricular systolic function with a normal functioning bioprosthetic tissue valve in aortic position, and intact repairs of both the mitral and tricuspid valves with no residual mitral or tricuspid regurgitation.  Plan:  We have not recommended any changes to the patient's current medications. Because she is now more than 3 months out from surgery it might be reasonable to consider switching the patient back to Eliquis rather than warfarin for long-term anticoagulation, particularly given the fact that the patient has had some problems maintaining stable INR. We will defer any subsequent decisions to Dr. Eldridge Dace or Dr. Shirlee Latch.  I have encouraged the patient continued  to increase her physical activity without any particular limitations.  The patient has been reminded regarding the importance of dental hygiene and the lifelong need for antibiotic prophylaxis for all dental cleanings and other related invasive procedures.  The patient will return to our office for routine follow-up and rhythm check next December, approximately 1 year following her original surgery. She will be referred to the atrial fibrillation clinic for long-term surveillance following Maze procedure. All of her questions have been addressed.   I spent in excess of 15 minutes during the conduct of this office consultation and >50% of this time involved direct face-to-face encounter with the patient for counseling and/or coordination of their care.    Salvatore Decent. Cornelius Moras, MD 07/17/2016 2:37 PM

## 2016-07-24 ENCOUNTER — Ambulatory Visit (INDEPENDENT_AMBULATORY_CARE_PROVIDER_SITE_OTHER): Payer: Medicare Other | Admitting: *Deleted

## 2016-07-24 DIAGNOSIS — I4819 Other persistent atrial fibrillation: Secondary | ICD-10-CM

## 2016-07-24 DIAGNOSIS — Z5181 Encounter for therapeutic drug level monitoring: Secondary | ICD-10-CM | POA: Diagnosis not present

## 2016-07-24 DIAGNOSIS — Z953 Presence of xenogenic heart valve: Secondary | ICD-10-CM | POA: Diagnosis not present

## 2016-07-24 DIAGNOSIS — I481 Persistent atrial fibrillation: Secondary | ICD-10-CM

## 2016-07-24 LAB — POCT INR: INR: 1.7

## 2016-07-25 ENCOUNTER — Other Ambulatory Visit: Payer: Self-pay | Admitting: Physician Assistant

## 2016-07-25 ENCOUNTER — Other Ambulatory Visit: Payer: Self-pay | Admitting: Interventional Cardiology

## 2016-07-25 ENCOUNTER — Telehealth: Payer: Self-pay | Admitting: Interventional Cardiology

## 2016-07-25 DIAGNOSIS — R0602 Shortness of breath: Secondary | ICD-10-CM

## 2016-07-25 DIAGNOSIS — I5043 Acute on chronic combined systolic (congestive) and diastolic (congestive) heart failure: Secondary | ICD-10-CM

## 2016-07-25 DIAGNOSIS — I447 Left bundle-branch block, unspecified: Secondary | ICD-10-CM

## 2016-07-25 DIAGNOSIS — Z9889 Other specified postprocedural states: Secondary | ICD-10-CM

## 2016-07-25 DIAGNOSIS — Z79899 Other long term (current) drug therapy: Secondary | ICD-10-CM

## 2016-07-25 DIAGNOSIS — I4819 Other persistent atrial fibrillation: Secondary | ICD-10-CM

## 2016-07-25 DIAGNOSIS — I428 Other cardiomyopathies: Secondary | ICD-10-CM

## 2016-07-25 NOTE — Telephone Encounter (Signed)
Medication Detail    Disp Refills Start End   furosemide (LASIX) 40 MG tablet 30 tablet 11 07/03/2016    Sig - Route: Take 1 tablet (40 mg total) by mouth daily. - Oral   E-Prescribing Status: Receipt confirmed by pharmacy (07/03/2016 12:35 PM EST)   Associated Diagnoses   S/P aortic valve replacement + mitral valve repair + tricupsid valve repair + maze procedure     Acute on chronic combined systolic and diastolic CHF (congestive heart failure) (HCC)     Non-ischemic cardiomyopathy (HCC)     Persistent atrial fibrillation (HCC)     LBBB (left bundle branch block)     Encounter for long-term current use of medication     SOB (shortness of breath)     Pharmacy   [id number] - [place], [place] - [id number]

## 2016-07-25 NOTE — Telephone Encounter (Signed)
New Message     *STAT* If patient is at the pharmacy, call can be transferred to refill team.   1. Which medications need to be refilled? (please list name of each medication and dose if known) furosemide (LASIX) 40 MG tablet potassium chloride SA (K-DUR,KLOR-CON) 20 MEQ tablet  2. Which pharmacy/location (including street and city if local pharmacy) is medication to be sent to? [id number] - [place], [place] - [address]  3. Do they need a 30 day or 90 day supply? 90 day supply

## 2016-07-26 ENCOUNTER — Telehealth: Payer: Self-pay | Admitting: Interventional Cardiology

## 2016-07-26 MED ORDER — POTASSIUM CHLORIDE CRYS ER 20 MEQ PO TBCR
40.0000 meq | EXTENDED_RELEASE_TABLET | Freq: Every day | ORAL | 2 refills | Status: DC
Start: 2016-07-26 — End: 2016-11-09

## 2016-07-26 MED ORDER — FUROSEMIDE 40 MG PO TABS: 40.0000 mg | ORAL_TABLET | Freq: Every day | ORAL | 3 refills | Status: DC

## 2016-07-26 NOTE — Telephone Encounter (Signed)
See phone note 07/26/16 

## 2016-07-26 NOTE — Telephone Encounter (Signed)
°*  STAT* If patient is at the pharmacy, call can be transferred to refill team.   1. Which medications need to be refilled? (please list name of each medication and dose if known) Klor- Clon 8mg    2. Which pharmacy/location (including street and city if local pharmacy) is [facility] [place]   3. Do they need a 30 day or 90 day supply?90

## 2016-07-26 NOTE — Telephone Encounter (Signed)
called pharmacy back as they are asking for 8 meq of the K+ , we have on file K+ 20 meq 2 T PO QD, it is potassium 20 meq 2 T PO QD per last OV, will send to pharmacy.

## 2016-07-26 NOTE — Telephone Encounter (Signed)
New message       *STAT* If patient is at the pharmacy, call can be transferred to refill team.   1. Which medications need to be refilled? (please list name of each medication and dose if known)  klor-con 2. Which pharmacy/location (including street and city if local pharmacy) is [facility] be sent to[facility] [place] 3. Do they need a 30 day or 90 day supply? 90 days

## 2016-07-26 NOTE — Telephone Encounter (Signed)
Call Documentation   Awilda Bill, CMA at 07/26/2016 1:39 PM   Status: Signed    called pharmacy back as they are asking for 8 meq of the K+ , we have on file K+ 20 meq 2 T PO QD, it is potassium 20 meq 2 T PO QD per last OV, will send to pharmacy.    Lutricia Horsfall at 07/26/2016 12:59 PM   Status: Signed     *STAT* If patient is at the pharmacy, call can be transferred to refill team.   1. Which medications need to be refilled? (please list name of each medication and dose if known) Klor- Clon 8mg    2. Which pharmacy/location (including street and city if local pharmacy) is [facility] to?[facility] [place]   3. Do they need a 30 day or 90 day supply?90     Approved    Disp Refills Start End  potassium chloride SA (K-DUR,KLOR-CON) 20 MEQ tablet 180 tablet 2 07/26/2016   Sig - Route:  Take 2 tablets (40 mEq total) by mouth daily. - Oral  Class:  Normal  DAW:  No  Authorizing Provider:  Corky Crafts, MD  Ordering User:  Awilda Bill, CMA  Visit Pharmacy   [phone] - [place], [place] - [address]

## 2016-07-27 ENCOUNTER — Telehealth: Payer: Self-pay | Admitting: *Deleted

## 2016-07-27 NOTE — Telephone Encounter (Signed)
Spoke with Deaf interpreter and pt is concerned that she has been placed on Atorvastatin 20 mg daily and asking if there was interaction between that  medication and Warfarin. Instructed there is no interaction between Atorovastatin and Warfarin. Pt also states very tired and is asking if need to continue to take  iron pill and instructed that she needs to take the Ferrous Sulfate as ordered and this will help her to feel stronger.Also instructed if continues to feel tired to call her PCP and let them know and she states understanding through interpreter

## 2016-07-28 ENCOUNTER — Telehealth: Payer: Self-pay | Admitting: Pharmacist

## 2016-07-28 NOTE — Telephone Encounter (Signed)
Will offer pt the option to switch back to Eliquis at her next INR check on 4/2.     Corky Crafts, MD  Lattie Haw, RN; Awilda Metro, [facility]        OK to switch this patient back to Eliquis from Coumadin.    THanks.   JV       Per Dr Orvan July 3/19 office visit note: Because she is now more than 3 months out from surgery it might be reasonable to consider switching the patient back to Eliquis rather than warfarin for long-term anticoagulation, particularly given the fact that the patient has had some problems maintaining stable INR. We will defer any subsequent decisions to Dr. Eldridge Dace or Dr. Shirlee Latch.

## 2016-07-31 ENCOUNTER — Ambulatory Visit (INDEPENDENT_AMBULATORY_CARE_PROVIDER_SITE_OTHER): Payer: Medicare Other | Admitting: *Deleted

## 2016-07-31 DIAGNOSIS — Z5181 Encounter for therapeutic drug level monitoring: Secondary | ICD-10-CM | POA: Diagnosis not present

## 2016-07-31 DIAGNOSIS — I4819 Other persistent atrial fibrillation: Secondary | ICD-10-CM

## 2016-07-31 DIAGNOSIS — Z953 Presence of xenogenic heart valve: Secondary | ICD-10-CM

## 2016-07-31 DIAGNOSIS — I481 Persistent atrial fibrillation: Secondary | ICD-10-CM | POA: Diagnosis not present

## 2016-07-31 LAB — POCT INR: INR: 3.5

## 2016-08-01 NOTE — Addendum Note (Signed)
Addended by: Raul Del on: 08/01/2016 04:49 PM   Modules accepted: Orders

## 2016-08-03 ENCOUNTER — Other Ambulatory Visit: Payer: Self-pay

## 2016-08-03 ENCOUNTER — Telehealth: Payer: Self-pay

## 2016-08-03 MED ORDER — APIXABAN 5 MG PO TABS: 5.0000 mg | ORAL_TABLET | Freq: Two times a day (BID) | ORAL | 6 refills | Status: DC

## 2016-08-03 NOTE — Telephone Encounter (Signed)
The pt asked coumadin clinic to let me know that she needs a PA for her Eliquis. We have not received a PA request from her pharmacy as RX was not sent in until today.  I have done PA through covermymeds. Awaiting response,

## 2016-08-04 ENCOUNTER — Telehealth: Payer: Self-pay | Admitting: Interventional Cardiology

## 2016-08-04 NOTE — Telephone Encounter (Signed)
LMOM again. 

## 2016-08-04 NOTE — Telephone Encounter (Signed)
New messages   Pt c/o medication issue:  1. Name of Medication: apixaban (ELIQUIS) 5 MG TABS tablet  2. How are you currently taking this medication (dosage and times per day)? 5 mg   3. Are you having a reaction (difficulty breathing--STAT)? No   4. What is your medication issue? Talk with  pharmacist.

## 2016-08-04 NOTE — Telephone Encounter (Signed)
LMOM x 2 

## 2016-08-04 NOTE — Telephone Encounter (Signed)
Pt was receiving patient assistance through Drug company in the past when on Eliquis, pt is wanting to make sure patient assistance forms have been completed and sent to company for her to receive drug free of charge.  Tresa Endo spoke with pt about this when pt was in office and switched to Eliquis.  Assured pt we are working on patient assistance for her Eliquis and we would contact her once this drug is approved or denied. Please follow-up on patient assistance for Eliquis.  Thanks

## 2016-08-04 NOTE — Telephone Encounter (Signed)
Pt called back, left message on voice mail had stepped outside and missed return phone call, would like to speak to pharmacist, please call back.  Thanks

## 2016-08-04 NOTE — Telephone Encounter (Signed)
Returned call to patient and spoke with interpreter who then dialed patient. She did not answer, I was able to leave a message that the interpreter signed on a video for the patient. Will await call back.

## 2016-08-07 NOTE — Telephone Encounter (Signed)
Will route to Dr. Eldridge Dace for approval to refill since Dr. Barry Dienes was the last one to fill.

## 2016-08-07 NOTE — Telephone Encounter (Signed)
OK to refill or 90 days, 3 refills

## 2016-08-07 NOTE — Telephone Encounter (Signed)
Follow up        *STAT* If patient is at the pharmacy, call can be transferred to refill team.   1. Which medications need to be refilled? (please list name of each medication and dose if known)  Carvedilol 12.5mg  2. Which pharmacy/location (including street and city if local pharmacy) is medication to be sent to? [facility] 3. Do they need a 30 day or 90 day supply? 90 day

## 2016-08-07 NOTE — Telephone Encounter (Signed)
Follow up    Pt is calling to return call. She asked that if she does not answer to please leave a message.

## 2016-08-07 NOTE — Telephone Encounter (Addendum)
pt wants refill on carvedilol, [facility] filled last, have msg in with [place] [facility] to see if Dr V will fill, informing pt of situation via VM.

## 2016-08-07 NOTE — Telephone Encounter (Signed)
pt called back has enough carvedilol for a week, Dr Cornelius Moras put her back on it at OV 07/17/16, please advise

## 2016-08-08 ENCOUNTER — Ambulatory Visit (INDEPENDENT_AMBULATORY_CARE_PROVIDER_SITE_OTHER): Payer: Medicare Other | Admitting: Interventional Cardiology

## 2016-08-08 ENCOUNTER — Encounter: Payer: Self-pay | Admitting: Interventional Cardiology

## 2016-08-08 ENCOUNTER — Telehealth: Payer: Self-pay | Admitting: *Deleted

## 2016-08-08 VITALS — BP 120/80 | HR 88 | Ht 68.0 in | Wt 193.0 lb

## 2016-08-08 DIAGNOSIS — I428 Other cardiomyopathies: Secondary | ICD-10-CM

## 2016-08-08 DIAGNOSIS — Z9889 Other specified postprocedural states: Secondary | ICD-10-CM | POA: Diagnosis not present

## 2016-08-08 DIAGNOSIS — Z7901 Long term (current) use of anticoagulants: Secondary | ICD-10-CM | POA: Diagnosis not present

## 2016-08-08 DIAGNOSIS — I481 Persistent atrial fibrillation: Secondary | ICD-10-CM | POA: Diagnosis not present

## 2016-08-08 DIAGNOSIS — I4819 Other persistent atrial fibrillation: Secondary | ICD-10-CM

## 2016-08-08 MED ORDER — CARVEDILOL 12.5 MG PO TABS: 12.5000 mg | ORAL_TABLET | Freq: Two times a day (BID) | ORAL | 3 refills | Status: DC

## 2016-08-08 NOTE — Telephone Encounter (Signed)
Patient present for OV today. Patient made aware that the [facility]. Patient requested it be refilled with [facility]. Medication reordered for refill at [facility]. Patient verbalized understanding.

## 2016-08-08 NOTE — Progress Notes (Signed)
Cardiology Office Note   Date:  08/08/2016   ID:  Allison [name], DOB [birth year], MRN [id number]  PCP:  [name], MD    No chief complaint on file.  F/u AFib  Wt Readings from Last 3 Encounters:  08/08/16 193 lb (87.5 kg)  07/17/16 190 lb (86.2 kg)  06/01/16 189 lb (85.7 kg)       History of Present Illness: Allison [name] is a 66 y.o. female   with a hx of deafness, paroxysmal atrial fibrillation with associated hyperthyroidism several years ago, HTN, fibromyalgia, asthma, GERD.   She was noted to be back in atrial fibrillation in May 2016. She was placed on Eliquis and set up for cardioversion. She was maintaining NSR after cardioversion. She walked into the office 12/01/14 with complaints of dizziness and an EKG confirmed recurrent atrial fibrillation. She felt poorly with this including dizziness and near syncope at times and increased DOE. We put her on Flecainide and planned a DCCV. But, she had significant symptoms after her first dose of Flecainide. She had severe abdominal pain. She was referred to the AFib Clinic. She was seen by Rudi Coco, NP and Dr. Johney Frame. She was switched to Propafenone 150 mg TID. She underwent DCCV as previously arranged on 12/04/14. She had return of NSR. She was seen back in the AFib Clinic 8/8 and noted abdominal bloating and constipation from the Propafenone. This drug was also stopped. It was not felt that she would be a good candidate for Multaq. She would require a washout period after stopping Propafenone before Sotalol could be considered (it would have to be started while in NSR as an OP per [employer] note). She noted difficulty planning an admission for Tikosyn Rx.  Echo showed severe MR.  THis was repaired in 12/17 with maze procedure and clipping of LAA.    EF was decreased.  She was placed on heart failure meds.  Exercise tolerance is better post surgery. Had some bleeding with warfarin but is now doing better on  Eliquis.  She was maintaining NSR at her last visit.     SHe was intolerant of flecainide and propafenone in the past.     Past Medical History:  Diagnosis Date  . AC (acromioclavicular) joint bone spurs    spurs in shoulders with pain  . Anxiety   . Aortic insufficiency   . Arthritis    all over  . Asthma    from reflux  . Chronic combined systolic and diastolic CHF (congestive heart failure) (HCC)   . Deafness   . Fibromyalgia   . GERD (gastroesophageal reflux disease)   . Hypertension   . Hypothyroidism   . Mitral regurgitation   . Non-ischemic cardiomyopathy (HCC)    a. Normal cors 01/2016.  Marland Kitchen Persistent atrial fibrillation (HCC)   . PFO (patent foramen ovale)    a. closure 03/2016.  . S/P aortic valve replacement with bioprosthetic valve 04/13/2016   23 mm [facility] Ease bovine pericardial tissue valve  . S/P mitral valve repair 04/13/2016   26 mm Sorin Memo 3D ring annuloplasty  . 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 Tricuspid regurgitation     Past Surgical History:  Procedure Laterality Date  . ABDOMINAL HYSTERECTOMY     partial  . AORTIC VALVE REPLACEMENT N/A 04/13/2016   Procedure: AORTIC VALVE REPLACEMENT (AVR) implanted with Magna Ease size 29mm;  Surgeon: Purcell Nails, MD;  Location: [facility] OR;  Service: Open Heart Surgery;  Laterality: N/A;  . CARDIAC CATHETERIZATION N/A 02/18/2016   Procedure: Right/Left Heart Cath and Coronary Angiography;  Surgeon: Laurey Morale, MD;  Location: [facility] INVASIVE CV LAB;  Service: Cardiovascular;  Laterality: N/A;  . CARDIOVERSION N/A 10/16/2014   Procedure: CARDIOVERSION;  Surgeon: Corky Crafts, MD;  Location: [facility] ENDOSCOPY;  Service: Cardiovascular;  Laterality: N/A;  . CARDIOVERSION N/A 12/04/2014   Procedure: CARDIOVERSION;  Surgeon: Thurmon Fair, MD;  Location: [facility] ENDOSCOPY;  Service: Cardiovascular;  Laterality: N/A;  . COLON SURGERY     spurs  . COLONOSCOPY   [id number]  . COLONOSCOPY WITH PROPOFOL  04/09/2012   Procedure: COLONOSCOPY WITH PROPOFOL;  Surgeon: Charolett Bumpers, MD;  Location: [facility] ENDOSCOPY;  Service: Endoscopy;  Laterality: N/A;  . ESOPHAGOGASTRODUODENOSCOPY ENDOSCOPY     several times  . EYE SURGERY     catarcts  . MAZE N/A 04/13/2016   Procedure: MAZE;  Surgeon: Purcell Nails, MD;  Location: [facility] OR;  Service: Open Heart Surgery;  Laterality: N/A;  . MITRAL VALVE REPAIR N/A 04/13/2016   Procedure: MITRAL VALVE REPAIR (MVR) using 26mm Sorin /MEMO 3D annuloplasty ring;  Surgeon: Purcell Nails, MD;  Location: [facility] OR;  Service: Open Heart Surgery;  Laterality: N/A;  . PATENT FORAMEN OVALE CLOSURE N/A 04/13/2016   Procedure: PATENT FORAMEN OVALE (PFO) CLOSURE;  Surgeon: Purcell Nails, MD;  Location: [facility] OR;  Service: Open Heart Surgery;  Laterality: N/A;  . TEE WITHOUT CARDIOVERSION N/A 02/18/2016   Procedure: TRANSESOPHAGEAL ECHOCARDIOGRAM (TEE);  Surgeon: Laurey Morale, MD;  Location: [facility] ENDOSCOPY;  Service: Cardiovascular;  Laterality: N/A;  . TEE WITHOUT CARDIOVERSION N/A 04/13/2016   Procedure: TRANSESOPHAGEAL ECHOCARDIOGRAM (TEE);  Surgeon: Purcell Nails, MD;  Location: [facility] OR;  Service: Open Heart Surgery;  Laterality: N/A;  . THYROID SURGERY  1991  . TONSILLECTOMY    . TOTAL SHOULDER ARTHROPLASTY Right 07/02/2012   Procedure: TOTAL SHOULDER ARTHROPLASTY;  Surgeon: Mable Paris, MD;  Location: [facility] OR;  Service: Orthopedics;  Laterality: Right;  Right total shoulder arthroplasty  . TRICUSPID VALVE REPLACEMENT N/A 04/13/2016   Procedure: TRICUSPID VALVE REPAIR using a T26 MC3 Edwards annuloplasty ring;  Surgeon: Purcell Nails, MD;  Location: [facility] OR;  Service: Open Heart Surgery;  Laterality: N/A;  . TUBAL LIGATION    . vericose surgery Right leg  05-1998  . WISDOM TOOTH EXTRACTION       Current Outpatient Prescriptions  Medication Sig Dispense Refill  . ALPRAZolam (XANAX) 0.25 MG tablet Take 1 tablet (0.25 mg  total) by mouth 2 (two) times daily as needed for anxiety. 10 tablet 0  . apixaban (ELIQUIS) 5 MG TABS tablet Take 1 tablet (5 mg total) by mouth 2 (two) times daily. 60 tablet 6  . aspirin EC 81 MG EC tablet Take 1 tablet (81 mg total) by mouth daily.    . Calcium Carbonate-Vitamin D (CALTRATE 600+D) 600-400 MG-UNIT per tablet Take 1 tablet by mouth 2 (two) times daily.     . carvedilol (COREG) 12.5 MG tablet Take 1 tablet (12.5 mg total) by mouth 2 (two) times daily with a meal. 180 tablet 3  . Cyanocobalamin (VITAMIN B-12) 5000 MCG SUBL Place 1 tablet under the tongue daily. For nerve pain in feet and metabolism support     . diphenhydrAMINE (BENADRYL) 25 mg capsule Take 25 mg by mouth daily as needed for itching.     . ferrous sulfate 325 (  65 FE) MG tablet Take 1 tablet (325 mg total) by mouth 2 (two) times daily with a meal. (Patient taking differently: Take 325 mg by mouth daily. ) 180 tablet 3  . furosemide (LASIX) 40 MG tablet Take 1 tablet (40 mg total) by mouth daily. 90 tablet 3  . gabapentin (NEURONTIN) 600 MG tablet TAKE 1 TABLET THREE TIMES A DAY 90 tablet 5  . levothyroxine (SYNTHROID, LEVOTHROID) 125 MCG tablet Take 125 mcg by mouth daily before breakfast.    . LORazepam (ATIVAN) 2 MG tablet Take 1 tablet (2 mg total) by mouth at bedtime. For restful sleep 10 tablet 0  . montelukast (SINGULAIR) 10 MG tablet Take 10 mg by mouth daily before breakfast.     . omeprazole (PRILOSEC) 40 MG capsule Take 40 mg by mouth daily.     Marland Kitchen oxyCODONE (OXY IR/ROXICODONE) 5 MG immediate release tablet Take 1 tablet (5 mg total) by mouth every 4 (four) hours as needed for severe pain. 10 tablet 0  . polyvinyl alcohol (LIQUIFILM TEARS) 1.4 % ophthalmic solution Place 1 drop into both eyes daily as needed for dry eyes.     . potassium chloride SA (K-DUR,KLOR-CON) 20 MEQ tablet Take 2 tablets (40 mEq total) by mouth daily. 180 tablet 2  . promethazine (PHENERGAN) 25 MG tablet Take 25 mg by mouth daily as  needed for nausea or vomiting.    Marland Kitchen tiZANidine (ZANAFLEX) 2 MG tablet Take 1 tablet (2 mg total) by mouth 2 (two) times daily as needed for muscle spasms. 10 tablet 0  . traMADol (ULTRAM) 50 MG tablet Take 50 mg by mouth every 6 (six) hours as needed for moderate pain.      No current facility-administered medications for this visit.     Allergies:   Biaxin [clarithromycin]; Levaquin [levofloxacin]; Trazodone and nefazodone; Sulfa antibiotics; Aleve [naproxen]; Bextra [valdecoxib]; Doxycycline; Durabac [apap-salicyl-phenyltolox-caff]; Estradiol; Macrobid [nitrofurantoin]; Motrin [ibuprofen]; Oxaprozin; Robaxin [methocarbamol]; and Topamax [topiramate]    Social History:  The patient  reports that she has never smoked. She has never used smokeless tobacco. She reports that she does not drink alcohol or use drugs.   Family History:  The patient's family history includes Arrhythmia in her father and mother; Heart attack in her maternal uncle; Hypertension in her father, maternal grandmother, and mother; Kidney disease in her paternal grandfather; Lung cancer in her father; Stroke in her maternal grandmother.    ROS:  Please see the history of present illness.   Otherwise, review of systems are positive for fatigue.   All other systems are reviewed and negative.    PHYSICAL EXAM: VS:  BP 120/80 (BP Location: Left Arm, Patient Position: Sitting, Cuff Size: Normal)   Pulse 88   Ht 5\' 8"  (1.727 m)   Wt 193 lb (87.5 kg)   SpO2 97%   BMI 29.35 kg/m  , BMI Body mass index is 29.35 kg/m. GEN: Well nourished, well developed, in no acute distress  HEENT: normal  Neck: no JVD, carotid bruits, or masses Cardiac: RRR; no murmurs, rubs, or gallops, tr leg edema  Respiratory:  clear to auscultation bilaterally, normal work of breathing GI: soft, nontender, nondistended, + BS MS: no deformity or atrophy  Skin: warm and dry, no rash Neuro:  Strength and sensation are intact Psych: euthymic mood, full  affect      Recent Labs: 01/20/2016: Brain Natriuretic Peptide 310.6 04/14/2016: Magnesium 2.0 05/09/2016: ALT 12; NT-Pro BNP 152 05/12/2016: TSH 11.680 05/22/2016: BUN 8; Creatinine, Ser  1.08; Hemoglobin 10.9; Platelets 348; Potassium 3.7; Sodium 139   Lipid Panel No results found for: CHOL, TRIG, HDL, CHOLHDL, VLDL, LDLCALC, LDLDIRECT   Other studies Reviewed: Additional studies/ records that were reviewed today with results demonstrating: Normal EF by prior echo.   ASSESSMENT AND PLAN:  1. AFib:  Back in NSR after Maze and MV repair, AVR, PFO closure.  Doing well.  Exercise capacity improved significantly.  Need sSBE prophylaxis due to valve issues. 2. History of hyperthyroidism: Now hypothyroid. Synthroid.  This is being adjusted. 3. Continue Eliquis for stroke prevention.  She previously had patient assistance. If she has more bleeding issues, would have to consider stopping aspirin.  SHe had some bloody nose issues recently.  4. SHOB: improved since her surgery.   Current medicines are reviewed at length with the patient today.  The patient concerns regarding her medicines were addressed.  The following changes have been made:  No change  Labs/ tests ordered today include:   No orders of the defined types were placed in this encounter.   Recommend 150 minutes/week of aerobic exercise Low fat, low carb, high fiber diet recommended  Disposition:   FU 6 months   Signed, Lance Muss, MD  08/08/2016 12:39 PM    [facility] [address] [place], [place], [place]  [address] Phone: [phone]; Fax: [phone]

## 2016-08-08 NOTE — Telephone Encounter (Signed)
[facility] fax states Allison [name] is on the plans list of covered drugs and doesn't need PA

## 2016-08-08 NOTE — Patient Instructions (Signed)

## 2016-09-08 ENCOUNTER — Telehealth: Payer: Self-pay | Admitting: Interventional Cardiology

## 2016-09-08 NOTE — Telephone Encounter (Signed)
Received call from patient-states [facility] and her [facility] was for tablets, states she usually takes and was unaware of this change.   Per chart review and [facility]-patient was suppose to start taking (2 tablets) daily when discharged from the hospital in December by Dr. Cornelius Moras.  Patient was unaware and has been taking .  Advised to start taking as prescribed.  Patient aware and verbalized understanding.

## 2016-09-08 NOTE — Telephone Encounter (Signed)
New message      Pt c/o medication issue:  1. Name of Medication: klor con 2. How are you currently taking this medication (dosage and times per day)? 20 meq  3. Are you having a reaction (difficulty breathing--STAT)?  no 4. What is your medication issue? Pt states that she picked up her presc and the dosage was different.  Did the doctor change the dosage to ?

## 2016-09-15 DIAGNOSIS — J302 Other seasonal allergic rhinitis: Secondary | ICD-10-CM | POA: Insufficient documentation

## 2016-09-15 DIAGNOSIS — J018 Other acute sinusitis: Secondary | ICD-10-CM | POA: Insufficient documentation

## 2016-09-27 ENCOUNTER — Telehealth: Payer: Self-pay | Admitting: Interventional Cardiology

## 2016-09-27 NOTE — Telephone Encounter (Signed)
New Message  Pt voiced she was talking to MD-Varanasi about her stomach for the last two weeks and needs to f/u with nurse.  Pt will need an interpretor.  Please f/u

## 2016-09-27 NOTE — Telephone Encounter (Signed)
See below

## 2016-09-28 NOTE — Telephone Encounter (Signed)
Attempted to call the patient with the interpretor service. Patient did not answer. Interpreter left a video message signing for the patient to call the office back.

## 2016-09-28 NOTE — Telephone Encounter (Signed)
Patient calling back with the interpretor. Patient states that she had cardiac surgery back in 03/2016. She states that where they inserted the tubes in her stomach that there are hard spots there with some bruising. She states that these spots are painful to touch. Patient denies having bruising anywhere else or any nosebleeds, blood in her stool, or any other evidence of bleeding. Patient wanting to know if this is normal. Patient advised to follow up with Dr. Orvan July office. Patient states that she has already followed up with them and was told that it could take a long time to heal. Patient advised to call their office again and that Dr. Eldridge Dace would be made aware.

## 2016-10-05 ENCOUNTER — Ambulatory Visit: Payer: Medicare Other | Admitting: Interventional Cardiology

## 2016-10-06 DIAGNOSIS — M26629 Arthralgia of temporomandibular joint, unspecified side: Secondary | ICD-10-CM | POA: Insufficient documentation

## 2016-10-20 ENCOUNTER — Other Ambulatory Visit: Payer: Self-pay | Admitting: Orthopedic Surgery

## 2016-10-24 ENCOUNTER — Telehealth: Payer: Self-pay

## 2016-10-24 NOTE — Telephone Encounter (Signed)
Clearance faxed to [facility] at Dr. Veda Canning office. Confirmation received that fax went through.

## 2016-10-24 NOTE — Telephone Encounter (Signed)
No further cardiac testing needed before surgery.  Avoid excessive IV fluids during perioperative period to avoid CHF exacerbation. Eliquis management per protocol.

## 2016-10-24 NOTE — Telephone Encounter (Signed)
Pt on Eliquis for afib with CHADS< 4 (HTN, CHF) and no history of stroke. Per protocol ok to hold for 24 hours prior to procedure.

## 2016-10-24 NOTE — Telephone Encounter (Signed)
Request for surgical clearance:  1. What type of surgery is being performed? 11/16/16   2. When is this surgery scheduled? Left TSA   3. Are there any medications that need to be held prior to surgery and how long?  Eliquis  4. Name of physician performing surgery? Dr. Ave Filter   5. What is your office phone and fax number? P- [phone], [phone]

## 2016-11-07 ENCOUNTER — Other Ambulatory Visit: Payer: Self-pay

## 2016-11-07 DIAGNOSIS — I83893 Varicose veins of bilateral lower extremities with other complications: Secondary | ICD-10-CM

## 2016-11-08 NOTE — Pre-Procedure Instructions (Signed)
ARMANDO LAUMAN  11/08/2016      [facility] SERVICE - [place], [place] - [address] [facility] [address] [place] [address] [place] [address] Phone: [phone] Fax: [phone]  [facility] [phone] [place], [place] - [address] [facility] [place] [address] Phone: [phone] Fax: [phone]    Your procedure is scheduled on July 19  Report to [facility] Admitting at [employer] A.M.  Call this number if you have problems the morning of surgery:  [phone]   Remember:  Do not eat food or drink liquids after midnight.   Take these medicines the morning of surgery with A SIP OF WATER ALPRAZolam (XANAX), carvedilol (COREG),  gabapentin (NEURONTIN), levothyroxine (SYNTHROID, LEVOTHROID),  montelukast (SINGULAIR), omeprazole (PRILOSEC), oxyCODONE (OXY IR/ROXICODONE), eye drops if needed, promethazine (PHENERGAN), tiZANidine (ZANAFLEX)  If needed, traMADol (ULTRAM) if needed  7 days prior to surgery STOP taking any Aleve, Naproxen, Ibuprofen, Motrin, Advil, Goody's, BC's, all herbal medications, fish oil, and all vitamins  Follow your doctors instructions regarding your Aspirin.  If no instructions were given by the doctor you will need to call the office to get instructions.  Your pre admission RN will also call for those instructions  Follow Physicians instructions about Eliquis   Do not wear jewelry, make-up or nail polish.  Do not wear lotions, powders, or perfumes, or deoderant.  Do not shave 48 hours prior to surgery.    Do not bring valuables to the hospital.  [facility] is not responsible for any belongings or valuables.  Contacts, dentures or bridgework may not be worn into surgery.  Leave your suitcase in the car.  After surgery it may be brought to your room.  For patients admitted to the hospital, discharge time will be determined by your treatment team.  Patients discharged the day of surgery will not be allowed to drive home.     Special instructions:   [facility]- Preparing For Surgery  Before surgery, you can play an important role. Because skin is not sterile, your skin needs to be as free of germs as possible. You can reduce the number of germs on your skin by washing with CHG (chlorahexidine gluconate) Soap before surgery.  CHG is an antiseptic cleaner which kills germs and bonds with the skin to continue killing germs even after washing.  Please do not use if you have an allergy to CHG or antibacterial soaps. If your skin becomes reddened/irritated stop using the CHG.  Do not shave (including legs and underarms) for at least 48 hours prior to first CHG shower. It is OK to shave your face.  Please follow these instructions carefully.   1. Shower the NIGHT BEFORE SURGERY and the MORNING OF SURGERY with CHG.   2. If you chose to wash your hair, wash your hair first as usual with your normal shampoo.  3. After you shampoo, rinse your hair and body thoroughly to remove the shampoo.  4. Use CHG as you would any other liquid soap. You can apply CHG directly to the skin and wash gently with a scrungie or a clean washcloth.   5. Apply the CHG Soap to your body ONLY FROM THE NECK DOWN.  Do not use on open wounds or open sores. Avoid contact with your eyes, ears, mouth and genitals (private parts). Wash genitals (private parts) with your normal soap.  6. Wash thoroughly, paying special attention to the area where your surgery will be  performed.  7. Thoroughly rinse your body with warm water from the neck down.  8. DO NOT shower/wash with your normal soap after using and rinsing off the CHG Soap.  9. Pat yourself dry with a CLEAN TOWEL.   10. Wear CLEAN PAJAMAS   11. Place CLEAN SHEETS on your bed the night of your first shower and DO NOT SLEEP WITH PETS.    Day of Surgery: Do not apply any deodorants/lotions. Please wear clean clothes to the hospital/surgery center.      Please read over the following  fact sheets that you were given.

## 2016-11-09 ENCOUNTER — Encounter (HOSPITAL_COMMUNITY): Payer: Self-pay

## 2016-11-09 ENCOUNTER — Encounter (HOSPITAL_COMMUNITY)
Admission: RE | Admit: 2016-11-09 | Discharge: 2016-11-09 | Disposition: A | Discharge status: Home / Self Care | Payer: Medicare Other | Source: Ambulatory Visit | Attending: Orthopedic Surgery | Admitting: Orthopedic Surgery

## 2016-11-09 DIAGNOSIS — I5042 Chronic combined systolic (congestive) and diastolic (congestive) heart failure: Secondary | ICD-10-CM | POA: Insufficient documentation

## 2016-11-09 DIAGNOSIS — Z01818 Encounter for other preprocedural examination: Secondary | ICD-10-CM | POA: Insufficient documentation

## 2016-11-09 DIAGNOSIS — E039 Hypothyroidism, unspecified: Secondary | ICD-10-CM | POA: Insufficient documentation

## 2016-11-09 DIAGNOSIS — J45909 Unspecified asthma, uncomplicated: Secondary | ICD-10-CM | POA: Insufficient documentation

## 2016-11-09 DIAGNOSIS — I110 Hypertensive heart disease with heart failure: Secondary | ICD-10-CM | POA: Insufficient documentation

## 2016-11-09 DIAGNOSIS — Z01812 Encounter for preprocedural laboratory examination: Secondary | ICD-10-CM | POA: Diagnosis present

## 2016-11-09 DIAGNOSIS — K219 Gastro-esophageal reflux disease without esophagitis: Secondary | ICD-10-CM | POA: Diagnosis not present

## 2016-11-09 HISTORY — DX: Primary osteoarthritis, left shoulder: M19.012

## 2016-11-09 LAB — COMPREHENSIVE METABOLIC PANEL
ALK PHOS: 119 U/L (ref 38–126)
ALT: 20 U/L (ref 14–54)
ANION GAP: 8 (ref 5–15)
AST: 22 U/L (ref 15–41)
Albumin: 3.9 g/dL (ref 3.5–5.0)
BILIRUBIN TOTAL: 0.3 mg/dL (ref 0.3–1.2)
BUN: 13 mg/dL (ref 6–20)
CALCIUM: 9.1 mg/dL (ref 8.9–10.3)
CO2: 29 mmol/L (ref 22–32)
Chloride: 101 mmol/L (ref 101–111)
Creatinine, Ser: 1.14 mg/dL — ABNORMAL HIGH (ref 0.44–1.00)
GFR, EST AFRICAN AMERICAN: 56 mL/min — AB (ref >60)
GFR, EST NON AFRICAN AMERICAN: 49 mL/min — AB (ref >60)
Glucose, Bld: 80 mg/dL (ref 65–99)
POTASSIUM: 4 mmol/L (ref 3.5–5.1)
Sodium: 138 mmol/L (ref 135–145)
TOTAL PROTEIN: 6.6 g/dL (ref 6.5–8.1)

## 2016-11-09 LAB — CBC WITH DIFFERENTIAL/PLATELET
Basophils Absolute: 0 10*3/uL (ref 0.0–0.1)
Basophils Relative: 0 %
Eosinophils Absolute: 0.5 10*3/uL (ref 0.0–0.7)
Eosinophils Relative: 7 %
HCT: 44.6 % (ref 36.0–46.0)
Hemoglobin: 14.3 g/dL (ref 12.0–15.0)
LYMPHS ABS: 2.2 10*3/uL (ref 0.7–4.0)
Lymphocytes Relative: 31 %
MCH: 31.1 pg (ref 26.0–34.0)
MCHC: 32.1 g/dL (ref 30.0–36.0)
MCV: 97 fL (ref 78.0–100.0)
MONO ABS: 0.6 10*3/uL (ref 0.1–1.0)
MONOS PCT: 8 %
NEUTROS ABS: 3.9 10*3/uL (ref 1.7–7.7)
NEUTROS PCT: 54 %
Platelets: 227 10*3/uL (ref 150–400)
RBC: 4.6 MIL/uL (ref 3.87–5.11)
RDW: 13.1 % (ref 11.5–15.5)
WBC: 7.1 10*3/uL (ref 4.0–10.5)

## 2016-11-09 LAB — URINALYSIS, ROUTINE W REFLEX MICROSCOPIC
BILIRUBIN URINE: NEGATIVE
Glucose, UA: NEGATIVE mg/dL
HGB URINE DIPSTICK: NEGATIVE
KETONES UR: NEGATIVE mg/dL
Leukocytes, UA: NEGATIVE
NITRITE: NEGATIVE
PROTEIN: NEGATIVE mg/dL
SPECIFIC GRAVITY, URINE: 1.004 — AB (ref 1.005–1.030)
pH: 5 (ref 5.0–8.0)

## 2016-11-09 LAB — PROTIME-INR
INR: 1.02
PROTHROMBIN TIME: 13.5 s (ref 11.4–15.2)

## 2016-11-09 LAB — SURGICAL PCR SCREEN
MRSA, PCR: NEGATIVE
Staphylococcus aureus: NEGATIVE

## 2016-11-09 LAB — APTT: aPTT: 33 seconds (ref 24–36)

## 2016-11-09 NOTE — Progress Notes (Addendum)
PCP - Westley Chandler [facility]  Cardiologist - Caron Presume  Chest x-ray - 11/09/16  EKG - 06/01/16 (E)  Stress Test - 06/25/12 (E)  Pulmonary Func- 04/10/16  ECHO - 05/11/16 (E)  Cardiac Cath - 02/18/16 (E)  Sleep Study - Denies CPAP - Denies  Interpreter- #[id number] Pt will need video interpreter for American Sign Language, as her and her husband are deaf. Pt also asked for pillows to be placed under her lower back due to chronic pain down her legs. Chart will be sent to anesthesia for review due to cardiac hx.  Pt denies having chest pain, sob, or fever at this time. Pt sts she stopped taking Eliquis and Aspirin yesterday as directed by her doctor. All instructions explained to the pt, with a verbal understanding of the material. Pt agrees to go over the instructions while at home for a better understanding. The opportunity to ask questions was provided.

## 2016-11-10 ENCOUNTER — Other Ambulatory Visit (INDEPENDENT_AMBULATORY_CARE_PROVIDER_SITE_OTHER): Payer: Self-pay | Admitting: Orthopedic Surgery

## 2016-11-10 NOTE — Progress Notes (Signed)
Anesthesia Chart Review:  Patient and husband are deaf and require American sign language interpreter  - PCP is [name], MD - Cardiologist is Lance Muss, MD who cleared pt for surgery noting: "Avoid excessive IV fluids during perioperative period to avoid CHF exacerbation."  Patient is a 66 year old female scheduled for L total shoulder arthroplasty on 11/16/2016 with Jones Broom, M.D.  [facility] includes:  - Mitral regurgitation, aortic insufficiency, tricuspid regurgitation, PFO, persistent afib s/p aortic valve replacement, mitral valve repair, tricuspid valve repair, closure of PFO, Maze procedure 04/13/16.    - Dilated nonischemic cardiomyopathy, chronic combined systolic and diastolic CHF, HTN, hypothyroidism, asthma, GERD.  - Never smoker. BMI 31. - S/p L shoulder arthroplasty 07/02/12.   Medications include: eliquis, ASA 81 mg, Lipitor, carvedilol, iron, Lasix, levothyroxine, Prilosec, potassium. Eliquis stopped 11/08/16  Preoperative labs reviewed.  CXR 11/09/16: No active cardiopulmonary disease.  EKG 06/01/16: NSR  Echo 05/11/16: - Left ventricle: The cavity size was normal. Wall thickness was normal. Systolic function was normal. The estimated ejection fraction was in the range of 50% to 55%. Wall motion was normal;  there were no regional wall motion abnormalities. - Aortic valve: A bioprosthesis was present. - Mitral valve: A bioprosthesis was present. - Left atrium: The atrium was moderately dilated. - Tricuspid valve: Prior procedures included surgical repair.  Carotid duplex 04/10/16:  - Findings suggest 1-39% internal carotid artery stenosis bilaterally.  - Vertebral arteries are patent with antegrade flow.  Cardiac cath 02/18/16:  1. No angiographic coronary disease.  2. Elevated PCWP but normal RA pressure.  Prominent V-waves in PCWP tracing.  3. Low cardiac output, suspect this is related to the severe mitral regurgitation that is known to be present from  TEE.  LV-gram not done given TEE today and CKD.   If no changes, I anticipate pt can proceed with surgery as scheduled.   Rica Mast, FNP-BC [facility]/Anesthesiology Phone: [phone] 11/10/2016 2:24 PM

## 2016-11-16 ENCOUNTER — Encounter (HOSPITAL_COMMUNITY)
Admission: RE | Disposition: A | Discharge status: Home / Self Care | Payer: Self-pay | Source: Ambulatory Visit | Attending: Orthopedic Surgery

## 2016-11-16 ENCOUNTER — Inpatient Hospital Stay (HOSPITAL_COMMUNITY): Payer: Medicare Other | Admitting: Certified Registered Nurse Anesthetist

## 2016-11-16 ENCOUNTER — Encounter (HOSPITAL_COMMUNITY): Payer: Self-pay

## 2016-11-16 ENCOUNTER — Inpatient Hospital Stay (HOSPITAL_COMMUNITY): Payer: Medicare Other

## 2016-11-16 ENCOUNTER — Inpatient Hospital Stay (HOSPITAL_COMMUNITY)
Admission: RE | Admit: 2016-11-16 | Discharge: 2016-11-17 | DRG: 483 | Disposition: A | Discharge status: Home / Self Care | Payer: Medicare Other | Source: Ambulatory Visit | Attending: Orthopedic Surgery | Admitting: Orthopedic Surgery

## 2016-11-16 ENCOUNTER — Inpatient Hospital Stay (HOSPITAL_COMMUNITY): Payer: Medicare Other | Admitting: Emergency Medicine

## 2016-11-16 DIAGNOSIS — G8918 Other acute postprocedural pain: Secondary | ICD-10-CM

## 2016-11-16 DIAGNOSIS — I481 Persistent atrial fibrillation: Secondary | ICD-10-CM | POA: Diagnosis present

## 2016-11-16 DIAGNOSIS — Z79899 Other long term (current) drug therapy: Secondary | ICD-10-CM | POA: Diagnosis not present

## 2016-11-16 DIAGNOSIS — Z886 Allergy status to analgesic agent status: Secondary | ICD-10-CM

## 2016-11-16 DIAGNOSIS — Z888 Allergy status to other drugs, medicaments and biological substances status: Secondary | ICD-10-CM | POA: Diagnosis not present

## 2016-11-16 DIAGNOSIS — F419 Anxiety disorder, unspecified: Secondary | ICD-10-CM | POA: Diagnosis present

## 2016-11-16 DIAGNOSIS — I5042 Chronic combined systolic (congestive) and diastolic (congestive) heart failure: Secondary | ICD-10-CM | POA: Diagnosis present

## 2016-11-16 DIAGNOSIS — Z96612 Presence of left artificial shoulder joint: Secondary | ICD-10-CM

## 2016-11-16 DIAGNOSIS — Z882 Allergy status to sulfonamides status: Secondary | ICD-10-CM

## 2016-11-16 DIAGNOSIS — Q211 Atrial septal defect: Secondary | ICD-10-CM | POA: Diagnosis not present

## 2016-11-16 DIAGNOSIS — Z7901 Long term (current) use of anticoagulants: Secondary | ICD-10-CM | POA: Diagnosis not present

## 2016-11-16 DIAGNOSIS — H9190 Unspecified hearing loss, unspecified ear: Secondary | ICD-10-CM | POA: Diagnosis present

## 2016-11-16 DIAGNOSIS — Z7982 Long term (current) use of aspirin: Secondary | ICD-10-CM | POA: Diagnosis not present

## 2016-11-16 DIAGNOSIS — Z881 Allergy status to other antibiotic agents status: Secondary | ICD-10-CM | POA: Diagnosis not present

## 2016-11-16 DIAGNOSIS — E039 Hypothyroidism, unspecified: Secondary | ICD-10-CM | POA: Diagnosis present

## 2016-11-16 DIAGNOSIS — M19012 Primary osteoarthritis, left shoulder: Principal | ICD-10-CM | POA: Diagnosis present

## 2016-11-16 DIAGNOSIS — K219 Gastro-esophageal reflux disease without esophagitis: Secondary | ICD-10-CM | POA: Diagnosis present

## 2016-11-16 DIAGNOSIS — Z791 Long term (current) use of non-steroidal anti-inflammatories (NSAID): Secondary | ICD-10-CM

## 2016-11-16 DIAGNOSIS — Z953 Presence of xenogenic heart valve: Secondary | ICD-10-CM | POA: Diagnosis not present

## 2016-11-16 DIAGNOSIS — J45909 Unspecified asthma, uncomplicated: Secondary | ICD-10-CM | POA: Diagnosis present

## 2016-11-16 DIAGNOSIS — I110 Hypertensive heart disease with heart failure: Secondary | ICD-10-CM | POA: Diagnosis present

## 2016-11-16 DIAGNOSIS — I428 Other cardiomyopathies: Secondary | ICD-10-CM | POA: Diagnosis present

## 2016-11-16 DIAGNOSIS — M797 Fibromyalgia: Secondary | ICD-10-CM | POA: Diagnosis present

## 2016-11-16 DIAGNOSIS — I083 Combined rheumatic disorders of mitral, aortic and tricuspid valves: Secondary | ICD-10-CM | POA: Diagnosis present

## 2016-11-16 HISTORY — PX: TOTAL SHOULDER ARTHROPLASTY: SHX126

## 2016-11-16 SURGERY — ARTHROPLASTY, SHOULDER, TOTAL
Anesthesia: General | Laterality: Left

## 2016-11-16 MED ORDER — BUPIVACAINE LIPOSOME 1.3 % IJ SUSP
INTRAMUSCULAR | Status: DC | PRN
  Administered 2016-11-16: 20 mL

## 2016-11-16 MED ORDER — ALUMINUM HYDROXIDE GEL 320 MG/5ML PO SUSP
15.0000 mL | ORAL | Status: DC | PRN
  Filled 2016-11-16: qty 30

## 2016-11-16 MED ORDER — MIDAZOLAM HCL 2 MG/2ML IJ SOLN
INTRAMUSCULAR | Status: AC
  Filled 2016-11-16: qty 2

## 2016-11-16 MED ORDER — MENTHOL 3 MG MT LOZG: 1.0000 | LOZENGE | OROMUCOSAL | Status: DC | PRN

## 2016-11-16 MED ORDER — LIDOCAINE HCL (CARDIAC) 20 MG/ML IV SOLN
INTRAVENOUS | Status: AC
  Filled 2016-11-16: qty 5

## 2016-11-16 MED ORDER — FLEET ENEMA 7-19 GM/118ML RE ENEM: 1.0000 | ENEMA | Freq: Once | RECTAL | Status: DC | PRN

## 2016-11-16 MED ORDER — LORAZEPAM 1 MG PO TABS
2.0000 mg | ORAL_TABLET | Freq: Every day | ORAL | Status: DC
  Administered 2016-11-16: 2 mg via ORAL
  Filled 2016-11-16: qty 2

## 2016-11-16 MED ORDER — METOCLOPRAMIDE HCL 5 MG/ML IJ SOLN: 5.0000 mg | Freq: Three times a day (TID) | INTRAMUSCULAR | Status: DC | PRN

## 2016-11-16 MED ORDER — HYDROMORPHONE HCL 1 MG/ML IJ SOLN: 0.2500 mg | INTRAMUSCULAR | Status: DC | PRN

## 2016-11-16 MED ORDER — PHENYLEPHRINE 40 MCG/ML (10ML) SYRINGE FOR IV PUSH (FOR BLOOD PRESSURE SUPPORT)
PREFILLED_SYRINGE | INTRAVENOUS | Status: AC
  Filled 2016-11-16: qty 10

## 2016-11-16 MED ORDER — ROCURONIUM BROMIDE 50 MG/5ML IV SOLN
INTRAVENOUS | Status: AC
  Filled 2016-11-16: qty 2

## 2016-11-16 MED ORDER — PROMETHAZINE HCL 25 MG/ML IJ SOLN: 6.2500 mg | INTRAMUSCULAR | Status: DC | PRN

## 2016-11-16 MED ORDER — CARVEDILOL 12.5 MG PO TABS
ORAL_TABLET | ORAL | Status: AC
  Filled 2016-11-16: qty 1

## 2016-11-16 MED ORDER — CARVEDILOL 12.5 MG PO TABS
12.5000 mg | ORAL_TABLET | Freq: Two times a day (BID) | ORAL | Status: DC
  Administered 2016-11-16: 12.5 mg via ORAL
  Filled 2016-11-16: qty 1

## 2016-11-16 MED ORDER — ATORVASTATIN CALCIUM 20 MG PO TABS
20.0000 mg | ORAL_TABLET | Freq: Every day | ORAL | Status: DC
  Administered 2016-11-16: 20 mg via ORAL
  Filled 2016-11-16: qty 1

## 2016-11-16 MED ORDER — ONDANSETRON HCL 4 MG/2ML IJ SOLN
INTRAMUSCULAR | Status: AC
  Filled 2016-11-16: qty 2

## 2016-11-16 MED ORDER — BUPIVACAINE LIPOSOME 1.3 % IJ SUSP
20.0000 mL | Freq: Once | INTRAMUSCULAR | Status: DC
  Filled 2016-11-16: qty 20

## 2016-11-16 MED ORDER — PHENOL 1.4 % MT LIQD: 1.0000 | OROMUCOSAL | Status: DC | PRN

## 2016-11-16 MED ORDER — MONTELUKAST SODIUM 10 MG PO TABS
10.0000 mg | ORAL_TABLET | Freq: Every day | ORAL | Status: DC
  Administered 2016-11-17: 10 mg via ORAL
  Filled 2016-11-16: qty 1

## 2016-11-16 MED ORDER — OXYCODONE HCL 5 MG PO TABS
ORAL_TABLET | ORAL | Status: AC
  Administered 2016-11-16: 5 mg via ORAL
  Filled 2016-11-16: qty 1

## 2016-11-16 MED ORDER — FENTANYL CITRATE (PF) 100 MCG/2ML IJ SOLN
INTRAMUSCULAR | Status: AC
  Filled 2016-11-16: qty 2

## 2016-11-16 MED ORDER — ACETAMINOPHEN 500 MG PO TABS
1000.0000 mg | ORAL_TABLET | Freq: Four times a day (QID) | ORAL | Status: DC
  Administered 2016-11-16 – 2016-11-17 (×3): 1000 mg via ORAL
  Filled 2016-11-16 (×3): qty 2

## 2016-11-16 MED ORDER — ZOLPIDEM TARTRATE 5 MG PO TABS: 5.0000 mg | ORAL_TABLET | Freq: Every evening | ORAL | Status: DC | PRN

## 2016-11-16 MED ORDER — MIDAZOLAM HCL 2 MG/2ML IJ SOLN: 0.5000 mg | Freq: Once | INTRAMUSCULAR | Status: DC | PRN

## 2016-11-16 MED ORDER — ONDANSETRON HCL 4 MG/2ML IJ SOLN
INTRAMUSCULAR | Status: DC | PRN
  Administered 2016-11-16: 4 mg via INTRAVENOUS

## 2016-11-16 MED ORDER — FUROSEMIDE 20 MG PO TABS
20.0000 mg | ORAL_TABLET | Freq: Every day | ORAL | Status: DC
  Administered 2016-11-16 – 2016-11-17 (×2): 20 mg via ORAL
  Filled 2016-11-16 (×2): qty 1

## 2016-11-16 MED ORDER — EPHEDRINE 5 MG/ML INJ
INTRAVENOUS | Status: AC
  Filled 2016-11-16: qty 10

## 2016-11-16 MED ORDER — LORATADINE 10 MG PO TABS
10.0000 mg | ORAL_TABLET | Freq: Every day | ORAL | Status: DC
  Administered 2016-11-17: 10 mg via ORAL
  Filled 2016-11-16: qty 1

## 2016-11-16 MED ORDER — SUGAMMADEX SODIUM 200 MG/2ML IV SOLN
INTRAVENOUS | Status: DC | PRN
  Administered 2016-11-16: 180 mg via INTRAVENOUS

## 2016-11-16 MED ORDER — CEFAZOLIN SODIUM-DEXTROSE 2-4 GM/100ML-% IV SOLN
2.0000 g | INTRAVENOUS | Status: AC
  Administered 2016-11-16: 2 g via INTRAVENOUS
  Filled 2016-11-16: qty 100

## 2016-11-16 MED ORDER — METOCLOPRAMIDE HCL 5 MG PO TABS: 5.0000 mg | ORAL_TABLET | Freq: Three times a day (TID) | ORAL | Status: DC | PRN

## 2016-11-16 MED ORDER — SUCCINYLCHOLINE CHLORIDE 200 MG/10ML IV SOSY
PREFILLED_SYRINGE | INTRAVENOUS | Status: AC
  Filled 2016-11-16: qty 10

## 2016-11-16 MED ORDER — APIXABAN 5 MG PO TABS
5.0000 mg | ORAL_TABLET | Freq: Two times a day (BID) | ORAL | Status: DC
  Administered 2016-11-16 – 2016-11-17 (×2): 5 mg via ORAL
  Filled 2016-11-16 (×2): qty 1

## 2016-11-16 MED ORDER — 0.9 % SODIUM CHLORIDE (POUR BTL) OPTIME
TOPICAL | Status: DC | PRN
  Administered 2016-11-16: 1000 mL

## 2016-11-16 MED ORDER — OXYCODONE HCL 5 MG PO TABS
5.0000 mg | ORAL_TABLET | ORAL | Status: DC | PRN
  Administered 2016-11-16 – 2016-11-17 (×4): 10 mg via ORAL
  Filled 2016-11-16 (×4): qty 2

## 2016-11-16 MED ORDER — GABAPENTIN 600 MG PO TABS
600.0000 mg | ORAL_TABLET | Freq: Three times a day (TID) | ORAL | Status: DC
  Administered 2016-11-16 – 2016-11-17 (×2): 600 mg via ORAL
  Filled 2016-11-16 (×2): qty 1

## 2016-11-16 MED ORDER — PROPOFOL 10 MG/ML IV BOLUS
INTRAVENOUS | Status: DC | PRN
  Administered 2016-11-16: 200 mg via INTRAVENOUS

## 2016-11-16 MED ORDER — PROPOFOL 10 MG/ML IV BOLUS
INTRAVENOUS | Status: AC
  Filled 2016-11-16: qty 20

## 2016-11-16 MED ORDER — SUGAMMADEX SODIUM 200 MG/2ML IV SOLN
INTRAVENOUS | Status: AC
  Filled 2016-11-16: qty 2

## 2016-11-16 MED ORDER — POVIDONE-IODINE 7.5 % EX SOLN
Freq: Once | CUTANEOUS | Status: DC
  Filled 2016-11-16: qty 118

## 2016-11-16 MED ORDER — FENTANYL CITRATE (PF) 250 MCG/5ML IJ SOLN
INTRAMUSCULAR | Status: AC
  Filled 2016-11-16: qty 5

## 2016-11-16 MED ORDER — POTASSIUM CHLORIDE ER 8 MEQ PO TBCR: 8.0000 meq | EXTENDED_RELEASE_TABLET | Freq: Every day | ORAL | Status: DC

## 2016-11-16 MED ORDER — OXYCODONE HCL 5 MG PO TABS
5.0000 mg | ORAL_TABLET | ORAL | Status: AC | PRN
  Administered 2016-11-16: 5 mg via ORAL

## 2016-11-16 MED ORDER — BISACODYL 5 MG PO TBEC: 5.0000 mg | DELAYED_RELEASE_TABLET | Freq: Every day | ORAL | Status: DC | PRN

## 2016-11-16 MED ORDER — CARVEDILOL 12.5 MG PO TABS
12.5000 mg | ORAL_TABLET | Freq: Two times a day (BID) | ORAL | Status: DC
  Administered 2016-11-16 – 2016-11-17 (×2): 12.5 mg via ORAL
  Filled 2016-11-16 (×2): qty 1

## 2016-11-16 MED ORDER — DIPHENHYDRAMINE HCL 12.5 MG/5ML PO ELIX: 12.5000 mg | ORAL_SOLUTION | ORAL | Status: DC | PRN

## 2016-11-16 MED ORDER — DEXAMETHASONE SODIUM PHOSPHATE 10 MG/ML IJ SOLN
INTRAMUSCULAR | Status: AC
  Filled 2016-11-16: qty 1

## 2016-11-16 MED ORDER — BUPIVACAINE-EPINEPHRINE (PF) 0.5% -1:200000 IJ SOLN
INTRAMUSCULAR | Status: DC | PRN
  Administered 2016-11-16: 30 mL via PERINEURAL

## 2016-11-16 MED ORDER — SODIUM CHLORIDE 0.9 % IR SOLN
Status: DC | PRN
  Administered 2016-11-16: 3000 mL

## 2016-11-16 MED ORDER — MIDAZOLAM HCL 2 MG/2ML IJ SOLN
2.0000 mg | Freq: Once | INTRAMUSCULAR | Status: AC
  Administered 2016-11-16: 2 mg via INTRAVENOUS

## 2016-11-16 MED ORDER — MEPERIDINE HCL 25 MG/ML IJ SOLN: 6.2500 mg | INTRAMUSCULAR | Status: DC | PRN

## 2016-11-16 MED ORDER — DEXAMETHASONE SODIUM PHOSPHATE 10 MG/ML IJ SOLN
INTRAMUSCULAR | Status: DC | PRN
  Administered 2016-11-16: 10 mg via INTRAVENOUS

## 2016-11-16 MED ORDER — ONDANSETRON HCL 4 MG PO TABS: 4.0000 mg | ORAL_TABLET | Freq: Four times a day (QID) | ORAL | Status: DC | PRN

## 2016-11-16 MED ORDER — FENTANYL CITRATE (PF) 100 MCG/2ML IJ SOLN
INTRAMUSCULAR | Status: DC | PRN
  Administered 2016-11-16: 100 ug via INTRAVENOUS

## 2016-11-16 MED ORDER — SENNOSIDES-DOCUSATE SODIUM 8.6-50 MG PO TABS: 1.0000 | ORAL_TABLET | Freq: Every evening | ORAL | Status: DC | PRN

## 2016-11-16 MED ORDER — ONDANSETRON HCL 4 MG/2ML IJ SOLN: 4.0000 mg | Freq: Four times a day (QID) | INTRAMUSCULAR | Status: DC | PRN

## 2016-11-16 MED ORDER — LACTATED RINGERS IV SOLN
INTRAVENOUS | Status: DC
  Administered 2016-11-16 (×2): via INTRAVENOUS

## 2016-11-16 MED ORDER — SODIUM CHLORIDE 0.9% FLUSH
INTRAVENOUS | Status: DC | PRN
  Administered 2016-11-16 (×2): 10 mL

## 2016-11-16 MED ORDER — PANTOPRAZOLE SODIUM 40 MG PO TBEC
80.0000 mg | DELAYED_RELEASE_TABLET | Freq: Every day | ORAL | Status: DC
  Administered 2016-11-17: 80 mg via ORAL
  Filled 2016-11-16: qty 2

## 2016-11-16 MED ORDER — MIDAZOLAM HCL 2 MG/2ML IJ SOLN
INTRAMUSCULAR | Status: DC | PRN
  Administered 2016-11-16: 2 mg via INTRAVENOUS

## 2016-11-16 MED ORDER — LEVOTHYROXINE SODIUM 25 MCG PO TABS
125.0000 ug | ORAL_TABLET | Freq: Every day | ORAL | Status: DC
  Administered 2016-11-17: 125 ug via ORAL
  Filled 2016-11-16: qty 1

## 2016-11-16 MED ORDER — SODIUM CHLORIDE 0.9 % IV SOLN
INTRAVENOUS | Status: DC
  Administered 2016-11-16: 18:00:00 via INTRAVENOUS

## 2016-11-16 MED ORDER — ROCURONIUM BROMIDE 100 MG/10ML IV SOLN
INTRAVENOUS | Status: DC | PRN
  Administered 2016-11-16: 70 mg via INTRAVENOUS

## 2016-11-16 MED ORDER — LORAZEPAM 1 MG PO TABS
1.0000 mg | ORAL_TABLET | Freq: Every day | ORAL | Status: DC | PRN
  Administered 2016-11-17: 1 mg via ORAL
  Filled 2016-11-16: qty 1

## 2016-11-16 MED ORDER — DOCUSATE SODIUM 100 MG PO CAPS
100.0000 mg | ORAL_CAPSULE | Freq: Two times a day (BID) | ORAL | Status: DC
  Administered 2016-11-16 – 2016-11-17 (×2): 100 mg via ORAL
  Filled 2016-11-16 (×2): qty 1

## 2016-11-16 MED ORDER — TIZANIDINE HCL 4 MG PO TABS
2.0000 mg | ORAL_TABLET | Freq: Three times a day (TID) | ORAL | Status: DC | PRN
  Administered 2016-11-16: 2 mg via ORAL
  Filled 2016-11-16: qty 1

## 2016-11-16 MED ORDER — MORPHINE SULFATE (PF) 4 MG/ML IV SOLN: 1.0000 mg | INTRAVENOUS | Status: DC | PRN

## 2016-11-16 MED ORDER — STERILE WATER FOR IRRIGATION IR SOLN
Status: DC | PRN
  Administered 2016-11-16: 1000 mL

## 2016-11-16 MED ORDER — CEFAZOLIN SODIUM-DEXTROSE 2-4 GM/100ML-% IV SOLN
2.0000 g | Freq: Four times a day (QID) | INTRAVENOUS | Status: AC
  Administered 2016-11-16 – 2016-11-17 (×3): 2 g via INTRAVENOUS
  Filled 2016-11-16 (×3): qty 100

## 2016-11-16 MED ORDER — FENTANYL CITRATE (PF) 100 MCG/2ML IJ SOLN
100.0000 ug | Freq: Once | INTRAMUSCULAR | Status: AC
  Administered 2016-11-16: 100 ug via INTRAVENOUS

## 2016-11-16 SURGICAL SUPPLY — 76 items
AID PSTN UNV HD RSTRNT DISP (MISCELLANEOUS) ×1
BIT DRILL 5/64X5 DISP (BIT) ×2 IMPLANT
BLADE SAW SAG 73X25 THK (BLADE) ×2
BLADE SAW SGTL 73X25 THK (BLADE) ×1 IMPLANT
BLADE SURG 15 STRL LF DISP TIS (BLADE) ×1 IMPLANT
BLADE SURG 15 STRL SS (BLADE) ×3
CAP SHOULDER TOTAL 2 ×2 IMPLANT
CEMENT BONE DEPUY (Cement) ×2 IMPLANT
CHLORAPREP W/TINT 26ML (MISCELLANEOUS) ×3 IMPLANT
CLOSURE STERI-STRIP 1/2X4 (GAUZE/BANDAGES/DRESSINGS) ×1
CLOSURE WOUND 1/2 X4 (GAUZE/BANDAGES/DRESSINGS) ×1
CLSR STERI-STRIP ANTIMIC 1/2X4 (GAUZE/BANDAGES/DRESSINGS) ×1 IMPLANT
COVER SURGICAL LIGHT HANDLE (MISCELLANEOUS) ×3 IMPLANT
DRAPE INCISE IOBAN 66X45 STRL (DRAPES) ×3 IMPLANT
DRAPE ORTHO SPLIT 77X108 STRL (DRAPES) ×6
DRAPE SURG 17X23 STRL (DRAPES) ×3 IMPLANT
DRAPE SURG ORHT 6 SPLT 77X108 (DRAPES) ×2 IMPLANT
DRAPE U-SHAPE 47X51 STRL (DRAPES) ×3 IMPLANT
DRSG AQUACEL AG ADV 3.5X10 (GAUZE/BANDAGES/DRESSINGS) ×2 IMPLANT
ELECT BLADE 4.0 EZ CLEAN MEGAD (MISCELLANEOUS)
ELECT REM PT RETURN 9FT ADLT (ELECTROSURGICAL) ×3
ELECTRODE BLDE 4.0 EZ CLN MEGD (MISCELLANEOUS) IMPLANT
ELECTRODE REM PT RTRN 9FT ADLT (ELECTROSURGICAL) ×1 IMPLANT
EVACUATOR 1/8 PVC DRAIN (DRAIN) IMPLANT
GLOVE BIO SURGEON STRL SZ7 (GLOVE) ×3 IMPLANT
GLOVE BIO SURGEON STRL SZ7.5 (GLOVE) ×3 IMPLANT
GLOVE BIOGEL PI IND STRL 7.0 (GLOVE) ×1 IMPLANT
GLOVE BIOGEL PI IND STRL 8 (GLOVE) ×1 IMPLANT
GLOVE BIOGEL PI INDICATOR 7.0 (GLOVE) ×2
GLOVE BIOGEL PI INDICATOR 8 (GLOVE) ×2
GOWN STRL REUS W/ TWL LRG LVL3 (GOWN DISPOSABLE) ×1 IMPLANT
GOWN STRL REUS W/ TWL XL LVL3 (GOWN DISPOSABLE) ×1 IMPLANT
GOWN STRL REUS W/TWL LRG LVL3 (GOWN DISPOSABLE) ×9
GOWN STRL REUS W/TWL XL LVL3 (GOWN DISPOSABLE) ×3
GUIDEWIRE GLENOID 2.5X220 (WIRE) IMPLANT
HANDPIECE INTERPULSE COAX TIP (DISPOSABLE) ×3
HEMOSTAT SURGICEL 2X14 (HEMOSTASIS) ×3 IMPLANT
HOOD PEEL AWAY FLYTE STAYCOOL (MISCELLANEOUS) ×6 IMPLANT
KIT BASIN OR (CUSTOM PROCEDURE TRAY) ×3 IMPLANT
KIT ROOM TURNOVER OR (KITS) ×3 IMPLANT
MANIFOLD NEPTUNE II (INSTRUMENTS) ×3 IMPLANT
NDL HYPO 25GX1X1/2 BEV (NEEDLE) IMPLANT
NDL MAYO TROCAR (NEEDLE) ×1 IMPLANT
NDL SPNL 18GX3.5 QUINCKE PK (NEEDLE) ×2 IMPLANT
NEEDLE HYPO 25GX1X1/2 BEV (NEEDLE) IMPLANT
NEEDLE MAYO TROCAR (NEEDLE) ×3 IMPLANT
NEEDLE SPNL 18GX3.5 QUINCKE PK (NEEDLE) ×6 IMPLANT
NS IRRIG 1000ML POUR BTL (IV SOLUTION) ×3 IMPLANT
PACK SHOULDER (CUSTOM PROCEDURE TRAY) ×3 IMPLANT
PAD ARMBOARD 7.5X6 YLW CONV (MISCELLANEOUS) ×6 IMPLANT
RESTRAINT HEAD UNIVERSAL NS (MISCELLANEOUS) ×3 IMPLANT
RETRIEVER SUT HEWSON (MISCELLANEOUS) ×3 IMPLANT
SET HNDPC FAN SPRY TIP SCT (DISPOSABLE) ×1 IMPLANT
SLING ARM FOAM STRAP LRG (SOFTGOODS) ×3 IMPLANT
SLING ARM FOAM STRAP MED (SOFTGOODS) IMPLANT
SLING ARM IMMOBILIZER LRG (SOFTGOODS) ×2 IMPLANT
SMARTMIX MINI TOWER (MISCELLANEOUS) ×3
SPONGE LAP 18X18 X RAY DECT (DISPOSABLE) ×1 IMPLANT
SPONGE LAP 4X18 X RAY DECT (DISPOSABLE) IMPLANT
STRIP CLOSURE SKIN 1/2X4 (GAUZE/BANDAGES/DRESSINGS) ×2 IMPLANT
SUCTION FRAZIER HANDLE 10FR (MISCELLANEOUS) ×2
SUCTION TUBE FRAZIER 10FR DISP (MISCELLANEOUS) ×1 IMPLANT
SUPPORT WRAP ARM LG (MISCELLANEOUS) ×3 IMPLANT
SUT ETHIBOND NAB CT1 #1 30IN (SUTURE) ×9 IMPLANT
SUT MNCRL AB 4-0 PS2 18 (SUTURE) ×3 IMPLANT
SUT SILK 2 0 TIES 17X18 (SUTURE)
SUT SILK 2-0 18XBRD TIE BLK (SUTURE) IMPLANT
SUT VIC AB 2-0 CT1 27 (SUTURE) ×3
SUT VIC AB 2-0 CT1 TAPERPNT 27 (SUTURE) ×1 IMPLANT
SYR 30ML LL (SYRINGE) ×6 IMPLANT
SYR CONTROL 10ML LL (SYRINGE) IMPLANT
TAPE LABRALWHITE 1.5X36 (TAPE) ×8 IMPLANT
TAPE SUT LABRALTAP WHT/BLK (SUTURE) ×5 IMPLANT
TOWEL OR 17X24 6PK STRL BLUE (TOWEL DISPOSABLE) ×3 IMPLANT
TOWEL OR 17X26 10 PK STRL BLUE (TOWEL DISPOSABLE) ×3 IMPLANT
TOWER SMARTMIX MINI (MISCELLANEOUS) ×1 IMPLANT

## 2016-11-16 NOTE — Transfer of Care (Signed)
Immediate Anesthesia Transfer of Care Note  Patient: Allison [name]  Procedure(s) Performed: Procedure(s) with comments: TOTAL SHOULDER ARTHROPLASTY (Left) - Left total shoulder arthroplasty  Patient Location: PACU  Anesthesia Type:General  Level of Consciousness: awake, alert  and oriented  Airway & Oxygen Therapy: Patient Spontanous Breathing and Patient connected to face mask oxygen  Post-op Assessment: Report given to RN, Post -op Vital signs reviewed and stable and Patient moving all extremities X 4  Post vital signs: Reviewed and stable  Last Vitals:  Vitals:   11/16/16 0839  BP: (!) 147/61  Pulse: 81  Resp: 16  Temp: 36.7 C    Last Pain:  Vitals:   11/16/16 1252  TempSrc:   PainSc: (P) 0-No pain      Patients Stated Pain Goal: 5 (11/16/16 0835)  Complications: No apparent anesthesia complications

## 2016-11-16 NOTE — Anesthesia Procedure Notes (Signed)
Procedure Name: Intubation Date/Time: 11/16/2016 10:44 AM Performed by: Trixie Deis A Pre-anesthesia Checklist: Patient identified, Emergency Drugs available, Suction available and Patient being monitored Patient Re-evaluated:Patient Re-evaluated prior to induction Oxygen Delivery Method: Circle System Utilized Preoxygenation: Pre-oxygenation with 100% oxygen Induction Type: IV induction Ventilation: Mask ventilation without difficulty Laryngoscope Size: Mac and 3 Grade View: Grade I Tube type: Oral Tube size: 7.0 mm Number of attempts: 1 Airway Equipment and Method: Stylet and Oral airway Placement Confirmation: ETT inserted through vocal cords under direct vision,  positive ETCO2 and breath sounds checked- equal and bilateral Secured at: 21 cm Tube secured with: Tape Dental Injury: Teeth and Oropharynx as per pre-operative assessment

## 2016-11-16 NOTE — Progress Notes (Signed)
ASL interpretor at bedsideduring PACU stay - Allison [name]- 2nd interpretor enroute to hospital.

## 2016-11-16 NOTE — Op Note (Signed)
Procedure(s): TOTAL SHOULDER ARTHROPLASTY Procedure Note  Allison [name] female 66 y.o. 11/16/2016  Procedure(s) and Anesthesia Type:    LEFT TOTAL SHOULDER ARTHROPLASTY - Choice    LEFT LONG HEAD BICEPS TENODESIS  Surgeon(s) and Role:    Jones Broom, MD - Primary   Indications:  66 y.o. female  With endstage left shoulder arthritis. Pain and dysfunction interfered with quality of life and nonoperative treatment with activity modification, NSAIDS and injections failed.     Surgeon: Mable Paris   Assistants: Damita Lack PA-C [facility] was present and scrubbed throughout the procedure and was essential in positioning, retraction, exposure, and closure)  Anesthesia: General endotracheal anesthesia with preoperative interscalene block given by the attending anesthesiologist   Procedure Detail  TOTAL SHOULDER ARTHROPLASTY  Findings: Tornier flex anatomic press-fit size 3 stem with a 48 head, cemented size 40 S Cortiloc glenoid.   A lesser tuberosity osteotomy was performed and repaired at the conclusion of the procedure.  Estimated Blood Loss:  200 mL         Drains: None   Blood Given: none          Specimens: none        Complications:  * No complications entered in OR log *         Disposition: PACU - hemodynamically stable.         Condition: stable    Procedure:   The patient was identified in the preoperative holding area where I personally marked the operative extremity after verifying with the patient and consent. She  was taken to the operating room where She was transferred to the   operative table.  The patient received an interscalene block in   the holding area by the attending anesthesiologist.  General anesthesia was induced   in the operating room without complication.  The patient did receive IV  Ancef prior to the commencement of the procedure.  The patient was   placed in the beach-chair position with the back raised about  30   degrees.  The nonoperative extremity and head and neck were carefully   positioned and padded protecting against neurovascular compromise.  The   left upper extremity was then prepped and draped in the standard sterile   fashion.    The appropriate operative time-out was performed with   Anesthesia, the perioperative staff, as well as myself and we all agreed   that the left side was the correct operative site.  An approximately   10 cm incision was made from the tip of the coracoid to the center point of the   humerus at the level of the axilla.  Dissection was carried down sharply   through subcutaneous tissues and cephalic vein was identified and taken   laterally with the deltoid.  The pectoralis major was taken medially.  The   upper 1 cm of the pectoralis major was released from its attachment on   the humerus.  The clavipectoral fascia was incised just lateral to the   conjoined tendon.  This incision was carried up to but not into the   coracoacromial ligament.  Digital palpation was used to prove   integrity of the axillary nerve which was protected throughout the   procedure.  Musculocutaneous nerve was not palpated in the operative   field.  Conjoined tendon was then retracted gently medially and the   deltoid laterally.  Anterior circumflex humeral vessels were clamped and   coagulated.  The soft  tissues overlying the biceps was incised and this   incision was carried across the transverse humeral ligament to the base   of the coracoid.  The biceps was noted to be severely degenerated. It was released from the superior labrum. The biceps was then tenodesed to the soft tissue just above   pectoralis major and the remaining portion of the biceps superiorly was   excised.  An osteotomy was performed at the lesser tuberosity.  The capsule was then   released all the way down to the 6 o'clock position of the humeral head.   The humeral head was then delivered with simultaneous  adduction,   extension and external rotation.  All humeral osteophytes were removed   and the anatomic neck of the humerus was marked and cut free hand at   approximately 25 degrees retroversion within about 3 mm of the cuff   reflection posteriorly.  The head size was estimated to be a 48 medium   offset.  At that point, the humeral head was retracted posteriorly with   a Fukuda retractor.   Remaining portion of the capsule was released at the base of the   coracoid.  The remaining biceps anchor and the entire anterior-inferior   labrum was excised.  The posterior labrum was also excised but the   posterior capsule was not released.  The guidepin was placed bicortically with +0 elevated guide.  The reamer was used to ream to concentric bone with punctate bleeding.  This gave an excellent concentric surface.  The center hole was then drilled for an anchor peg glenoid followed by the three peripheral holes and none of the holes   exited the glenoid wall.  I then pulse irrigated these holes and dried   them with Surgicel.  The three peripheral holes were then   pressurized cemented and the anchor peg glenoid was placed and impacted   with an excellent fit.  The glenoid was a 40 small component.  The proximal humerus was then again exposed taking care not to displace the glenoid.    The entry awl was used followed by sounding reamers and then sequentially broached from size 1 to 3. This was then left in place and the calcar planer was used. Trial head was placed with a 48.  With the trial implantation of the component,  there was approximately 50% posterior translation with immediate snap back to the   anatomic position.  With forward elevation, there was no tendency   towards posterior subluxation.   The trial was removed and the final implant was prepared on a back table.  The trial was removed and the final implant was prepared on a back table.   3 small holes were drilled on the medial side of the  lesser tuberosity osteotomy, through which 2 labral tapes were passed. The implant was then placed through the loop of the 2 labral tapes and impacted with an excellent press-fit. This achieved excellent anatomic reconstruction of the proximal humerus.  The joint was then copiously irrigated with pulse lavage.  The subscapularis and   lesser tuberosity osteotomy were then repaired using the 2 labral tapes previously passed in a double row fashion with horizontal mattress sutures medially brought over through bone tunnels tied over a bone bridge laterally.   One #1 Ethibond was placed at the rotator interval just above   the lesser tuberosity. Copious irrigation was used. Throughout the case a mixture of 20 mL liposomal bupivacaine and 40  mL normal saline was used to infiltrate the deep capsular tissue, bony surfaces and subcutaneous tissue. Skin was closed with 2-0 Vicryl sutures in the deep dermal layer and 4-0 Monocryl in a subcuticular  running fashion.  Sterile dressings were then applied including Aquacel.  The patient was placed in a sling and allowed to awaken from general anesthesia and taken to the recovery room in stable condition.      POSTOPERATIVE PLAN:  Early passive range of motion will be allowed with the goal of 0 degrees external rotation and 90 degrees forward elevation.  No internal rotation at this time.  No active motion of the arm until the lesser tuberosity heals.  The patient will likely be kept in the hospital for 1-2 days and then discharged home.

## 2016-11-16 NOTE — Progress Notes (Signed)
Pt admitted to the unit from pacu;ASL interpretor at bedside to assist in communicating with pt. pt A&O x4; LUE remains in sling, report some numbness and tingling to LUE from surgery; clean dry and intact Aquacel dsg remains intact to incision site. Pt oriented to the unit and room; fall/safety precaution and prevention education completed with pt; pt voices understanding and denies any questions. Pt VSS; voided per report; call light within reach and ASL interpretor remains at bedside. Will continue to closely monitor. Dionne Bucy RN

## 2016-11-16 NOTE — Anesthesia Postprocedure Evaluation (Signed)
Anesthesia Post Note  Patient: Allison [name]  Procedure(s) Performed: Procedure(s) (LRB): TOTAL SHOULDER ARTHROPLASTY (Left)     Patient location during evaluation: PACU Anesthesia Type: General and Regional Level of consciousness: awake and alert, patient cooperative and oriented Pain management: pain level controlled Vital Signs Assessment: post-procedure vital signs reviewed and stable Respiratory status: spontaneous breathing, nonlabored ventilation, respiratory function stable and patient connected to nasal cannula oxygen Cardiovascular status: blood pressure returned to baseline and stable Postop Assessment: no signs of nausea or vomiting Anesthetic complications: no    Last Vitals:  Vitals:   11/16/16 0839 11/16/16 1252  BP: (!) 147/61 (!) 132/91  Pulse: 81 86  Resp: 16 12  Temp: 36.7 C     Last Pain:  Vitals:   11/16/16 1252  TempSrc:   PainSc: 0-No pain                 Jeanpierre Thebeau,E. Caris Cerveny

## 2016-11-16 NOTE — H&P (Signed)
Allison [name] is an 66 y.o. female.   Chief Complaint: Left shoulder pain and dysfunction HPI: Endstage L shoulder arthritis with significant pain and dysfunction, failed conservative measures.  Pain interferes with sleep and quality of life.   Past Medical History:  Diagnosis Date  . AC (acromioclavicular) joint bone spurs    spurs in shoulders with pain  . Anxiety   . Aortic insufficiency   . Arthritis    all over  . Asthma    from reflux  . Chronic combined systolic and diastolic CHF (congestive heart failure) (HCC)   . Deafness   . Dysrhythmia   . Fibromyalgia   . GERD (gastroesophageal reflux disease)   . Hypertension   . Hypothyroidism   . Mitral regurgitation   . Non-ischemic cardiomyopathy (HCC)    a. Normal cors 01/2016.  . Osteoarthritis of glenohumeral joint, left    Left shoulder  . Persistent atrial fibrillation (HCC)   . PFO (patent foramen ovale)    a. closure 03/2016.  . S/P aortic valve replacement with bioprosthetic valve 04/13/2016   23 mm [facility] Ease bovine pericardial tissue valve  . S/P mitral valve repair 04/13/2016   26 mm Sorin Memo 3D ring annuloplasty  . 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 Tricuspid regurgitation     Past Surgical History:  Procedure Laterality Date  . ABDOMINAL HYSTERECTOMY     partial  . AORTIC VALVE REPLACEMENT N/A 04/13/2016   Procedure: AORTIC VALVE REPLACEMENT (AVR) implanted with Magna Ease size 23mm;  Surgeon: Purcell Nails, MD;  Location: [facility] OR;  Service: Open Heart Surgery;  Laterality: N/A;  . CARDIAC CATHETERIZATION N/A 02/18/2016   Procedure: Right/Left Heart Cath and Coronary Angiography;  Surgeon: Laurey Morale, MD;  Location: [facility] INVASIVE CV LAB;  Service: Cardiovascular;  Laterality: N/A;  . CARDIOVERSION N/A 10/16/2014   Procedure: CARDIOVERSION;  Surgeon: Corky Crafts, MD;  Location: [facility] ENDOSCOPY;  Service: Cardiovascular;  Laterality: N/A;  .  CARDIOVERSION N/A 12/04/2014   Procedure: CARDIOVERSION;  Surgeon: Thurmon Fair, MD;  Location: [facility] ENDOSCOPY;  Service: Cardiovascular;  Laterality: N/A;  . COLON SURGERY     spurs  . COLONOSCOPY  [id number]  . COLONOSCOPY WITH PROPOFOL  04/09/2012   Procedure: COLONOSCOPY WITH PROPOFOL;  Surgeon: Charolett Bumpers, MD;  Location: [facility] ENDOSCOPY;  Service: Endoscopy;  Laterality: N/A;  . ESOPHAGOGASTRODUODENOSCOPY ENDOSCOPY     several times  . EYE SURGERY     catarcts  . MAZE N/A 04/13/2016   Procedure: MAZE;  Surgeon: Purcell Nails, MD;  Location: [facility] OR;  Service: Open Heart Surgery;  Laterality: N/A;  . MITRAL VALVE REPAIR N/A 04/13/2016   Procedure: MITRAL VALVE REPAIR (MVR) using 26mm Sorin /MEMO 3D annuloplasty ring;  Surgeon: Purcell Nails, MD;  Location: [facility] OR;  Service: Open Heart Surgery;  Laterality: N/A;  . PATENT FORAMEN OVALE CLOSURE N/A 04/13/2016   Procedure: PATENT FORAMEN OVALE (PFO) CLOSURE;  Surgeon: Purcell Nails, MD;  Location: [facility] OR;  Service: Open Heart Surgery;  Laterality: N/A;  . TEE WITHOUT CARDIOVERSION N/A 02/18/2016   Procedure: TRANSESOPHAGEAL ECHOCARDIOGRAM (TEE);  Surgeon: Laurey Morale, MD;  Location: [facility] ENDOSCOPY;  Service: Cardiovascular;  Laterality: N/A;  . TEE WITHOUT CARDIOVERSION N/A 04/13/2016   Procedure: TRANSESOPHAGEAL ECHOCARDIOGRAM (TEE);  Surgeon: Purcell Nails, MD;  Location: [facility] OR;  Service: Open Heart Surgery;  Laterality: N/A;  . THYROID SURGERY  1991  .  TONSILLECTOMY    . TOTAL SHOULDER ARTHROPLASTY Right 07/02/2012   Procedure: TOTAL SHOULDER ARTHROPLASTY;  Surgeon: Mable Paris, MD;  Location: [facility] OR;  Service: Orthopedics;  Laterality: Right;  Right total shoulder arthroplasty  . TRICUSPID VALVE REPLACEMENT N/A 04/13/2016   Procedure: TRICUSPID VALVE REPAIR using a T26 MC3 Edwards annuloplasty ring;  Surgeon: Purcell Nails, MD;  Location: [facility] OR;  Service: Open Heart Surgery;  Laterality: N/A;  . TUBAL LIGATION    .  vericose surgery Right leg  05-1998  . WISDOM TOOTH EXTRACTION      Family History  Problem Relation Age of Onset  . Lung cancer Father   . Arrhythmia Father   . Hypertension Father   . Arrhythmia Mother   . Hypertension Mother   . Stroke Maternal Grandmother   . Hypertension Maternal Grandmother   . Heart attack Maternal Uncle        X2  . Kidney disease Paternal Grandfather    Social History:  reports that she has never smoked. She has never used smokeless tobacco. She reports that she does not drink alcohol or use drugs.  Allergies:  Allergies  Allergen Reactions  . Biaxin [Clarithromycin] Nausea Only and Other (See Comments)    Dizzy, blurred vision, achy stomach  . Levaquin [Levofloxacin] Nausea Only and Other (See Comments)    Dizzy, achy stomach, can't sleep  . Trazodone And Nefazodone Other (See Comments)    Weakness in legs Numbness in arms Funny feeling Rapid heart beat Lose focus  . Sulfa Antibiotics Other (See Comments)    Blister on large toe  . Aleve [Naproxen] Nausea And Vomiting and Other (See Comments)    Stomach ache  . Bextra [Valdecoxib] Nausea And Vomiting and Other (See Comments)    Stomach ache   . Doxycycline Nausea Only and Other (See Comments)    Stomach ache  . Durabac [Apap-Salicyl-Phenyltolox-Caff] Nausea And Vomiting  . Estradiol Nausea Only and Other (See Comments)    Hurt stomach  . Macrobid [Nitrofurantoin] Other (See Comments)    BLOATING  . Motrin [Ibuprofen] Nausea Only and Other (See Comments)    Stomach ahce  . Oxaprozin Nausea Only and Other (See Comments)    Hurt stomach  . Robaxin [Methocarbamol] Nausea Only and Other (See Comments)    Hurt stomach  . Topamax [Topiramate] Nausea And Vomiting    Medications Prior to Admission  Medication Sig Dispense Refill  . apixaban (ELIQUIS) 5 MG TABS tablet Take 1 tablet (5 mg total) by mouth 2 (two) times daily. 60 tablet 6  . aspirin EC 81 MG EC tablet Take 1 tablet (81 mg total)  by mouth daily.    Marland Kitchen atorvastatin (LIPITOR) 20 MG tablet Take 20 mg by mouth daily.    . Calcium Carbonate-Vitamin D (CALTRATE 600+D) 600-400 MG-UNIT per tablet Take 1 tablet by mouth daily.     . carvedilol (COREG) 12.5 MG tablet Take 1 tablet (12.5 mg total) by mouth 2 (two) times daily with a meal. 180 tablet 3  . cetirizine (ZYRTEC) 10 MG tablet Take 10 mg by mouth daily as needed for allergies.    . Cyanocobalamin (VITAMIN B-12) 5000 MCG SUBL Place 1 tablet under the tongue daily. For nerve pain in feet and metabolism support     . diclofenac (VOLTAREN) 75 MG EC tablet Take 75 mg by mouth daily as needed for moderate pain.    . diphenhydrAMINE (BENADRYL) 25 mg capsule Take 25 mg by mouth daily  as needed for itching.     . ferrous sulfate 325 (65 FE) MG tablet Take 1 tablet (325 mg total) by mouth 2 (two) times daily with a meal. (Patient taking differently: Take 325 mg by mouth daily. ) 180 tablet 3  . fluticasone (FLONASE) 50 MCG/ACT nasal spray Place 2 sprays into both nostrils daily.    . furosemide (LASIX) 40 MG tablet Take 1 tablet (40 mg total) by mouth daily. (Patient taking differently: Take 20 mg by mouth daily. ) 90 tablet 3  . gabapentin (NEURONTIN) 600 MG tablet TAKE 1 TABLET THREE TIMES A DAY 90 tablet 5  . levothyroxine (SYNTHROID, LEVOTHROID) 125 MCG tablet Take 125 mcg by mouth daily before breakfast.    . LORazepam (ATIVAN) 2 MG tablet Take 1 tablet (2 mg total) by mouth at bedtime. For restful sleep (Patient taking differently: Take 1-2 mg by mouth See admin instructions. Take 1 mg by mouth in the morning as needed for anxiety and take 2 mg by mouth at bedtime) 10 tablet 0  . montelukast (SINGULAIR) 10 MG tablet Take 10 mg by mouth daily before breakfast.     . omeprazole (PRILOSEC) 40 MG capsule Take 40 mg by mouth daily.     . polyvinyl alcohol (LIQUIFILM TEARS) 1.4 % ophthalmic solution Place 1 drop into both eyes daily as needed for dry eyes.     . potassium chloride  (KLOR-CON) 8 MEQ tablet Take 8 mEq by mouth daily.    . promethazine (PHENERGAN) 25 MG tablet Take 25 mg by mouth 3 (three) times daily as needed for nausea or vomiting.     Marland Kitchen tiZANidine (ZANAFLEX) 2 MG tablet Take 1 tablet (2 mg total) by mouth 2 (two) times daily as needed for muscle spasms. (Patient taking differently: Take 2 mg by mouth 3 (three) times daily as needed for muscle spasms. ) 10 tablet 0  . traMADol (ULTRAM) 50 MG tablet Take 50-100 mg by mouth 2 (two) times daily as needed for moderate pain.     Marland Kitchen ALPRAZolam (XANAX) 0.25 MG tablet Take 1 tablet (0.25 mg total) by mouth 2 (two) times daily as needed for anxiety. (Patient not taking: Reported on 11/09/2016) 10 tablet 0  . oxyCODONE (OXY IR/ROXICODONE) 5 MG immediate release tablet Take 1 tablet (5 mg total) by mouth every 4 (four) hours as needed for severe pain. (Patient not taking: Reported on 11/09/2016) 10 tablet 0    No results found for this or any previous visit (from the past 48 hour(s)). No results found.  Review of Systems  All other systems reviewed and are negative.   Blood pressure (!) 147/61, pulse 81, temperature 98.1 F (36.7 C), temperature source Oral, resp. rate 16, height 5' 7.5" (1.715 m), weight 90.7 kg (199 lb 14.4 oz), SpO2 97 %. Physical Exam  Constitutional: She is oriented to person, place, and time. She appears well-developed and well-nourished.  HENT:  Head: Atraumatic.  Eyes: EOM are normal.  Cardiovascular: Intact distal pulses.   Respiratory: Effort normal.  Musculoskeletal:  Left shoulder pain with limited range of motion.NVID  Neurological: She is alert and oriented to person, place, and time.  Skin: Skin is warm and dry.  Psychiatric: She has a normal mood and affect.     Assessment/Plan Endstage left shoulder arthritis failed conservative measures Plan left total shoulder replacement Risks / benefits of surgery discussed Consent on chart  NPO for OR Preop  antibiotics   Mable Paris, MD 11/16/2016, 9:42 AM

## 2016-11-16 NOTE — Discharge Instructions (Signed)

## 2016-11-16 NOTE — Anesthesia Preprocedure Evaluation (Addendum)
Anesthesia Evaluation  Patient identified by MRN, date of birth, ID band Patient awake    Reviewed: Allergy & Precautions, NPO status , Patient's Chart, lab work & pertinent test results  History of Anesthesia Complications Negative for: history of anesthetic complications  Airway Mallampati: II  TM Distance: >3 FB Neck ROM: Full    Dental  (+) Dental Advisory Given   Pulmonary asthma ,    breath sounds clear to auscultation       Cardiovascular hypertension, Pt. on home beta blockers and Pt. on medications (-) CAD + dysrhythmias Atrial Fibrillation + Valvular Problems/Murmurs (s/p AVR)  Rhythm:Regular Rate:Normal     Neuro/Psych deaf    GI/Hepatic Neg liver ROS, GERD  Medicated and Controlled,  Endo/Other  Hypothyroidism   Renal/GU negative Renal ROS     Musculoskeletal  (+) Arthritis , Fibromyalgia -  Abdominal   Peds  Hematology eliquis   Anesthesia Other Findings   Reproductive/Obstetrics                            Anesthesia Physical Anesthesia Plan  ASA: III  Anesthesia Plan: General   Post-op Pain Management: GA combined w/ Regional for post-op pain   Induction: Intravenous  PONV Risk Score and Plan: 3 and Ondansetron, Dexamethasone and Midazolam  Airway Management Planned: Oral ETT  Additional Equipment:   Intra-op Plan:   Post-operative Plan: Extubation in OR  Informed Consent: I have reviewed the patients History and Physical, chart, labs and discussed the procedure including the risks, benefits and alternatives for the proposed anesthesia with the patient or authorized representative who has indicated his/her understanding and acceptance.   Dental advisory given  Plan Discussed with: CRNA and Surgeon  Anesthesia Plan Comments: (Plan routine monitors, GETA with interscalene block for post op analgesia)        Anesthesia Quick Evaluation

## 2016-11-16 NOTE — Anesthesia Procedure Notes (Signed)
Anesthesia Regional Block: Interscalene brachial plexus block   Pre-Anesthetic Checklist: ,, timeout performed, Correct Patient, Correct Site, Correct Laterality, Correct Procedure, Correct Position, site marked, Risks and benefits discussed,  Surgical consent,  Pre-op evaluation,  At surgeon's request and post-op pain management  Laterality: Left and Upper  Prep: chloraprep       Needles:   Needle Type: Stimulator Needle - 40     Needle Length: 4cm  Needle Gauge: 21     Additional Needles:   Procedures:, nerve stimulator,,,,,,,   Nerve Stimulator or Paresthesia:  Response: forearm twitch, 0.45 mA, 0.1 ms,   Additional Responses:   Narrative:  Start time: 11/16/2016 9:39 AM End time: 11/16/2016 9:44 AM Injection made incrementally with aspirations every 5 mL.  Performed by: Personally  Anesthesiologist: Jean Rosenthal, Brysan Mcevoy  Additional Notes: Pt identified in Holding room.  Monitors applied. Working IV access confirmed. Sterile prep L neck.  #21ga PNS to forearm twitch at 0.5mA threshold.  30cc 0.5% Bupivacaine with 1:200k epi injected incrementally after negative test dose.  Patient asymptomatic, VSS, no heme aspirated, tolerated well.  Sandford Craze, MD

## 2016-11-17 ENCOUNTER — Encounter (HOSPITAL_COMMUNITY): Payer: Self-pay | Admitting: Orthopedic Surgery

## 2016-11-17 MED ORDER — OXYCODONE HCL 5 MG PO TABS: 5.0000 mg | ORAL_TABLET | ORAL | 0 refills | Status: DC | PRN

## 2016-11-17 NOTE — Progress Notes (Signed)
OT NOTE  Pt seen for OT eval and education. Will need to return to complete education with pt's daughter. Nsg asked to page me when daughter arrives   [place], OT/L  [phone] 11/17/2016

## 2016-11-17 NOTE — Care Management (Signed)
No case management needs identified.patient will discharge home.

## 2016-11-17 NOTE — Progress Notes (Signed)
Occupational Therapy Evaluation and treatment Patient Details Name: Allison [name] MRN: [id number] DOB: 12/14/1950 Today's Date: 11/17/2016    History of Present Illness 66 yo with extensive PMH - see chart who is s/p L TSA.   Clinical Impression   Pt seen for 2 sessions to complete pt/family education. Additional education required due to pt being hearing impaired. Attempted to utilize sign language interpreter, however, pt declined. All information reviewed with pt's daughter during second visit and daughter verbalized understanding. Pt safe to DC home with S for ADL and exercise per protocol. Pt to folow up with MD to progress rehab of shoulder.     Follow Up Recommendations  DC plan and follow up therapy as arranged by surgeon;Supervision - Intermittent    Equipment Recommendations  None recommended by OT    Recommendations for Other Services       Precautions / Restrictions Precautions Precautions: Shoulder Type of Shoulder Precautions: passive - FF 0-90; ER to neutral; elbow /wrist/hand ROM WFL Shoulder Interventions: Shoulder sling/immobilizer;Off for dressing/bathing/exercises Precaution Booklet Issued: Yes (comment) Required Braces or Orthoses: Sling Restrictions Weight Bearing Restrictions: Yes LUE Weight Bearing: Non weight bearing      Mobility Bed Mobility Overal bed mobility: Modified Independent                Transfers Overall transfer level: Modified independent                    Balance                                           ADL either performed or assessed with clinical judgement   ADL Overall ADL's : Needs assistance/impaired                                     Functional mobility during ADLs: Modified independent General ADL Comments: See shoulder section. Completed all educaiton with pt then returned and compelted education with pt's daughter. Writtent information reviewed. Pt required mod vc  to recall information from earlier session. Pt tearful at times. Educated on HEP, sling management and correct positioning in sitting - pt sleeps in recliner.      Vision         Perception     Praxis      Pertinent Vitals/Pain Pain Assessment: 0-10 Pain Score: 5  Pain Location: L shoulder Pain Descriptors / Indicators: Aching Pain Intervention(s): Limited activity within patient's tolerance;Repositioned;Ice applied     Hand Dominance Right   Extremity/Trunk Assessment Upper Extremity Assessment Upper Extremity Assessment: LUE deficits/detail LUE Deficits / Details: Able to complete FF 0-70 and ER to neutral with vc LUE Sensation:  (block active)   Lower Extremity Assessment Lower Extremity Assessment: Overall WFL for tasks assessed   Cervical / Trunk Assessment Cervical / Trunk Assessment: Normal   Communication Communication Communication: Deaf   Cognition Arousal/Alertness: Awake/alert Behavior During Therapy: WFL for tasks assessed/performed Overall Cognitive Status: Within Functional Limits for tasks assessed                                     General Comments       Exercises Exercises: Shoulder Shoulder Exercises Shoulder Flexion: PROM;Left;10 reps;Seated (0-70)  Shoulder External Rotation: PROM;Seated (to neutral) Elbow Flexion: AAROM;10 reps;Left;Seated Elbow Extension: AAROM;Left;10 reps;Seated Wrist Flexion: AROM;10 reps;Seated;Left Wrist Extension: AROM;Left;10 reps;Seated Digit Composite Flexion: AROM;Left;10 reps;Seated Composite Extension: AROM;Left;10 reps;Seated   Shoulder Instructions Shoulder Instructions Donning/doffing shirt without moving shoulder: Modified independent Method for sponge bathing under operated UE: Supervision/safety;Caregiver independent with task Donning/doffing sling/immobilizer: Supervision/safety;Caregiver independent with task Correct positioning of sling/immobilizer: Supervision/safety;Caregiver  independent with task Pendulum exercises (written home exercise program): Supervision/safety;Caregiver independent with task ROM for elbow, wrist and digits of operated UE: Supervision/safety;Caregiver independent with task Sling wearing schedule (on at all times/off for ADL's): Supervision/safety;Caregiver independent with task Proper positioning of operated UE when showering: Supervision/safety;Caregiver independent with task Positioning of UE while sleeping: Supervision/safety;Caregiver independent with task    Home Living Family/patient expects to be discharged to:: Private residence Living Arrangements: Spouse/significant other Available Help at Discharge: Family Type of Home: House Home Access: Stairs to enter Secretary/administrator of Steps: 5 Entrance Stairs-Rails: None Home Layout: One level     Bathroom Shower/Tub: Chief Strategy Officer: Standard     Home Equipment: Environmental consultant - 2 wheels;Cane - single point;Shower seat   Additional Comments: interpreter asking home setup, equipment belongs to spouse      Prior Functioning/Environment Level of Independence: Independent                 OT Problem List: Decreased strength;Decreased range of motion;Decreased coordination;Impaired UE functional use;Pain;Increased edema;Decreased knowledge of precautions      OT Treatment/Interventions:      OT Goals(Current goals can be found in the care plan section) Acute Rehab OT Goals Patient Stated Goal: to go home OT Goal Formulation: All assessment and education complete, DC therapy  OT Frequency:     Barriers to D/C:            Co-evaluation              AM-PAC PT "6 Clicks" Daily Activity     Outcome Measure Help from another person eating meals?: None Help from another person taking care of personal grooming?: A Little Help from another person toileting, which includes using toliet, bedpan, or urinal?: None Help from another person bathing  (including washing, rinsing, drying)?: A Little Help from another person to put on and taking off regular upper body clothing?: A Little Help from another person to put on and taking off regular lower body clothing?: None 6 Click Score: 21   End of Session Nurse Communication: Mobility status;Precautions;Weight bearing status;Other (comment) (ready for DC)  Activity Tolerance: Patient tolerated treatment well Patient left: in chair;with call bell/phone within reach;with family/visitor present  OT Visit Diagnosis: Muscle weakness (generalized) (M62.81);Pain Pain - Right/Left: Left Pain - part of body: Shoulder                Time: 1007-1030 OT Time Calculation (min): 23 min  2nd visit: 1127 - 1144 (16 min) Charges:  OT General Charges $OT Visit:  (2 visits) OT Evaluation $OT Eval Moderate Complexity: 1 Procedure OT Treatments $Self Care/Home Management : 8-22 mins $Therapeutic Exercise: 8-22 mins G-Codes:     [facility], OT/L  [phone] 11/17/2016  Shakera Ebrahimi,HILLARY 11/17/2016, 3:58 PM

## 2016-11-17 NOTE — Progress Notes (Signed)
   PATIENT ID: Allison [name]   1 Day Post-Op Procedure(s) (LRB): TOTAL SHOULDER ARTHROPLASTY (Left)  Subjective: Doing well this am. Wants to go home. Pain well controlled.   Objective:  Vitals:   11/17/16 0047 11/17/16 0433  BP: (!) 113/59 105/63  Pulse: 93 95  Resp: 19 18  Temp: 97.6 F (36.4 C) 97.8 F (36.6 C)     L UE dressing c/d/i Wiggles fingers, distally NVI  Labs:  No results for input(s): HGB in the last 72 hours.No results for input(s): WBC, RBC, HCT, PLT in the last 72 hours.No results for input(s): NA, K, CL, CO2, BUN, CREATININE, GLUCOSE, CALCIUM in the last 72 hours.  Assessment and Plan: 1 day s/p left TSA OT- PROM to 90 FF, 0ER Likely d/c home today Scripts in chart Fu with Dr. Ave Filter in 2 weeks  VTE proph: Eliquis, SCDs

## 2016-11-17 NOTE — Discharge Summary (Signed)
Patient ID: CHRISTIANA GUREVICH MRN: [id number] DOB/AGE: [birth year] 66 y.o.  Admit date: 11/16/2016 Discharge date: 11/17/2016  Admission Diagnoses:  Active Problems:   S/P shoulder replacement, left   Discharge Diagnoses:  Same  Past Medical History:  Diagnosis Date  . AC (acromioclavicular) joint bone spurs    spurs in shoulders with pain  . Anxiety   . Aortic insufficiency   . Arthritis    all over  . Asthma    from reflux  . Chronic combined systolic and diastolic CHF (congestive heart failure) (HCC)   . Deafness   . Dysrhythmia   . Fibromyalgia   . GERD (gastroesophageal reflux disease)   . Hypertension   . Hypothyroidism   . Mitral regurgitation   . Non-ischemic cardiomyopathy (HCC)    a. Normal cors 01/2016.  . Osteoarthritis of glenohumeral joint, left    Left shoulder  . Persistent atrial fibrillation (HCC)   . PFO (patent foramen ovale)    a. closure 03/2016.  . S/P aortic valve replacement with bioprosthetic valve 04/13/2016   23 mm [facility] Ease bovine pericardial tissue valve  . S/P mitral valve repair 04/13/2016   26 mm Sorin Memo 3D ring annuloplasty  . 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 Tricuspid regurgitation     Surgeries: Procedure(s): TOTAL SHOULDER ARTHROPLASTY on 11/16/2016   Consultants:   Discharged Condition: Improved  Hospital Course: KJERSTEN ORMISTON is an 66 y.o. female who was admitted 11/16/2016 for operative treatment of left shoulder end stange OA. Patient has severe unremitting pain that affects sleep, daily activities, and work/hobbies. After pre-op clearance the patient was taken to the operating room on 11/16/2016 and underwent  Procedure(s): TOTAL SHOULDER ARTHROPLASTY.    Patient was given perioperative antibiotics: Anti-infectives    Start     Dose/Rate Route Frequency Ordered Stop   11/16/16 1700  ceFAZolin (ANCEF) IVPB 2g/100 mL premix     2 g 200 mL/hr over 30 Minutes  Intravenous Every 6 hours 11/16/16 1558 11/17/16 0643   11/16/16 0741  ceFAZolin (ANCEF) IVPB 2g/100 mL premix     2 g 200 mL/hr over 30 Minutes Intravenous On call to O.R. 11/16/16 0741 11/16/16 1045       Patient was given sequential compression devices, early ambulation, and eliquis to prevent DVT.  Patient benefited maximally from hospital stay and there were no complications.    Recent vital signs: Patient Vitals for the past 24 hrs:  BP Temp Temp src Pulse Resp SpO2 Height Weight  11/17/16 0433 105/63 97.8 F (36.6 C) Oral 95 18 95 % - -  11/17/16 0047 (!) 113/59 97.6 F (36.4 C) Oral 93 19 94 % - -  11/16/16 2036 115/79 (!) 97.5 F (36.4 C) Oral (!) 104 19 94 % - -  11/16/16 1557 126/81 97.7 F (36.5 C) Oral (!) 104 20 92 % - -  11/16/16 1420 - (!) 97.4 F (36.3 C) - - - - - -  11/16/16 1415 - - - 92 16 92 % - -  11/16/16 1408 (!) 117/99 - - 91 (!) 28 94 % - -  11/16/16 1351 125/82 - - 89 14 93 % - -  11/16/16 1345 - - - 84 19 94 % - -  11/16/16 1336 131/84 - - 87 15 91 % - -  11/16/16 1321 130/62 - - 87 18 94 % - -  11/16/16 1306 138/77 - - 85  12 96 % - -  11/16/16 1252 (!) 132/91 (!) 97.3 F (36.3 C) - 86 12 95 % - -  11/16/16 0839 (!) 147/61 98.1 F (36.7 C) Oral 81 16 97 % - -  11/16/16 0835 - - - - - - 5' 7.5" (1.715 m) 90.7 kg (199 lb 14.4 oz)     Recent laboratory studies: No results for input(s): WBC, HGB, HCT, PLT, NA, K, CL, CO2, BUN, CREATININE, GLUCOSE, INR, CALCIUM in the last 72 hours.  Invalid input(s): PT, 2   Discharge Medications:   Allergies as of 11/17/2016      Reactions   Biaxin [clarithromycin] Nausea Only, Other (See Comments)   Dizzy, blurred vision, achy stomach   Levaquin [levofloxacin] Nausea Only, Other (See Comments)   Dizzy, achy stomach, can't sleep   Trazodone And Nefazodone Other (See Comments)   Weakness in legs Numbness in arms Funny feeling Rapid heart beat Lose focus   Sulfa Antibiotics Other (See Comments)   Blister  on large toe   Aleve [naproxen] Nausea And Vomiting, Other (See Comments)   Stomach ache   Bextra [valdecoxib] Nausea And Vomiting, Other (See Comments)   Stomach ache   Doxycycline Nausea Only, Other (See Comments)   Stomach ache   Durabac [apap-salicyl-phenyltolox-caff] Nausea And Vomiting   Estradiol Nausea Only, Other (See Comments)   Hurt stomach   Macrobid [nitrofurantoin] Other (See Comments)   BLOATING   Motrin [ibuprofen] Nausea Only, Other (See Comments)   Stomach ahce   Oxaprozin Nausea Only, Other (See Comments)   Hurt stomach   Robaxin [methocarbamol] Nausea Only, Other (See Comments)   Hurt stomach   Topamax [topiramate] Nausea And Vomiting      Medication List    TAKE these medications   ALPRAZolam 0.25 MG tablet Commonly known as:  XANAX Take 1 tablet (0.25 mg total) by mouth 2 (two) times daily as needed for anxiety.   apixaban 5 MG Tabs tablet Commonly known as:  ELIQUIS Take 1 tablet (5 mg total) by mouth 2 (two) times daily.   aspirin 81 MG EC tablet Take 1 tablet (81 mg total) by mouth daily.   atorvastatin 20 MG tablet Commonly known as:  LIPITOR Take 20 mg by mouth daily.   CALTRATE 600+D 600-400 MG-UNIT tablet Generic drug:  Calcium Carbonate-Vitamin D Take 1 tablet by mouth daily.   carvedilol 12.5 MG tablet Commonly known as:  COREG Take 1 tablet (12.5 mg total) by mouth 2 (two) times daily with a meal.   cetirizine 10 MG tablet Commonly known as:  ZYRTEC Take 10 mg by mouth daily as needed for allergies.   diclofenac 75 MG EC tablet Commonly known as:  VOLTAREN Take 75 mg by mouth daily as needed for moderate pain.   diphenhydrAMINE 25 mg capsule Commonly known as:  BENADRYL Take 25 mg by mouth daily as needed for itching.   ferrous sulfate 325 (65 FE) MG tablet Take 1 tablet (325 mg total) by mouth 2 (two) times daily with a meal. What changed:  when to take this   fluticasone 50 MCG/ACT nasal spray Commonly known as:   FLONASE Place 2 sprays into both nostrils daily.   furosemide 40 MG tablet Commonly known as:  LASIX Take 1 tablet (40 mg total) by mouth daily. What changed:  how much to take   gabapentin 600 MG tablet Commonly known as:  NEURONTIN TAKE 1 TABLET THREE TIMES A DAY   levothyroxine 125 MCG tablet Commonly  known as:  SYNTHROID, LEVOTHROID Take 125 mcg by mouth daily before breakfast.   LORazepam 2 MG tablet Commonly known as:  ATIVAN Take 1 tablet (2 mg total) by mouth at bedtime. For restful sleep What changed:  how much to take  when to take this  additional instructions   montelukast 10 MG tablet Commonly known as:  SINGULAIR Take 10 mg by mouth daily before breakfast.   omeprazole 40 MG capsule Commonly known as:  PRILOSEC Take 40 mg by mouth daily.   oxyCODONE 5 MG immediate release tablet Commonly known as:  Oxy IR/ROXICODONE Take 1-2 tablets (5-10 mg total) by mouth every 4 (four) hours as needed for severe pain. What changed:  how much to take   polyvinyl alcohol 1.4 % ophthalmic solution Commonly known as:  LIQUIFILM TEARS Place 1 drop into both eyes daily as needed for dry eyes.   potassium chloride 8 MEQ tablet Commonly known as:  KLOR-CON Take 8 mEq by mouth daily.   promethazine 25 MG tablet Commonly known as:  PHENERGAN Take 25 mg by mouth 3 (three) times daily as needed for nausea or vomiting.   tiZANidine 2 MG tablet Commonly known as:  ZANAFLEX Take 1 tablet (2 mg total) by mouth 2 (two) times daily as needed for muscle spasms. What changed:  when to take this   traMADol 50 MG tablet Commonly known as:  ULTRAM Take 50-100 mg by mouth 2 (two) times daily as needed for moderate pain.   Vitamin B-12 5000 MCG Subl Place 1 tablet under the tongue daily. For nerve pain in feet and metabolism support       Diagnostic Studies: Dg Chest 2 View  Result Date: 11/09/2016 CLINICAL DATA:  Preop for shoulder replacement surgery. History of open  heart surgery. EXAM: CHEST  2 VIEW COMPARISON:  Chest radiograph 05/15/2016. FINDINGS: Normal cardiomediastinal silhouette. Previous atrial appendage clamp. Status post tricuspid, mitral, and aortic valve replacement. No infiltrates or failure. No effusion or pneumothorax. RIGHT total shoulder arthroplasty appears uncomplicated. IMPRESSION: No active cardiopulmonary disease. Electronically Signed   By: Elsie Stain M.D.   On: 11/09/2016 14:03   Dg Shoulder Left Port  Result Date: 11/16/2016 CLINICAL DATA:  Left shoulder arthroplasty . EXAM: LEFT SHOULDER - 1 VIEW COMPARISON:  MRI 08/18/2010 . FINDINGS: Left shoulder replacement. Hardware intact. Anatomic alignment on frontal view. No no acute bony abnormality. Prior cardiac valve replacement. Left age is appendage clip noted. IMPRESSION: Left shoulder replacement. Hardware intact. Anatomic alignment on frontal view. Electronically Signed   By: Maisie Fus  Register   On: 11/16/2016 14:58    Disposition: 01-Home or Self Care  Discharge Instructions    Call MD / Call 911    Complete by:  As directed    If you experience chest pain or shortness of breath, CALL 911 and be transported to the hospital emergency room.  If you develope a fever above 101 F, pus (white drainage) or increased drainage or redness at the wound, or calf pain, call your surgeon's office.   Constipation Prevention    Complete by:  As directed    Drink plenty of fluids.  Prune juice may be helpful.  You may use a stool softener, such as Colace (over the counter) 100 mg twice a day.  Use MiraLax (over the counter) for constipation as needed.   Diet - low sodium heart healthy    Complete by:  As directed    Increase activity slowly as tolerated  Complete by:  As directed       Follow-up Information    Jones Broom, MD. Schedule an appointment as soon as possible for a visit in 2 weeks.   Specialty:  Orthopedic Surgery Contact information: [address] [place] [address] [phone]            Signed: Jiles Harold 11/17/2016, 8:08 AM

## 2016-11-17 NOTE — Progress Notes (Signed)
Pt being discharged home via wheelchair with family. Pt alert and oriented x4. VSS. Pt c/o no pain at this time. No signs of respiratory distress. Education complete and care plans resolved. IV removed with catheter intact and pt tolerated well. No further issues at this time. Pt to follow up with PCP. Eirik Schueler R, RN 

## 2017-01-11 ENCOUNTER — Encounter (HOSPITAL_COMMUNITY): Payer: Self-pay | Admitting: Nurse Practitioner

## 2017-01-11 ENCOUNTER — Ambulatory Visit (HOSPITAL_COMMUNITY)
Admission: RE | Admit: 2017-01-11 | Discharge: 2017-01-11 | Disposition: A | Discharge status: Home / Self Care | Payer: Medicare Other | Source: Ambulatory Visit | Attending: Nurse Practitioner | Admitting: Nurse Practitioner

## 2017-01-11 VITALS — BP 116/74 | HR 74 | Ht 67.5 in | Wt 198.8 lb

## 2017-01-11 DIAGNOSIS — Z886 Allergy status to analgesic agent status: Secondary | ICD-10-CM | POA: Diagnosis not present

## 2017-01-11 DIAGNOSIS — M797 Fibromyalgia: Secondary | ICD-10-CM | POA: Diagnosis not present

## 2017-01-11 DIAGNOSIS — Z96612 Presence of left artificial shoulder joint: Secondary | ICD-10-CM | POA: Insufficient documentation

## 2017-01-11 DIAGNOSIS — Z96611 Presence of right artificial shoulder joint: Secondary | ICD-10-CM | POA: Insufficient documentation

## 2017-01-11 DIAGNOSIS — Z7982 Long term (current) use of aspirin: Secondary | ICD-10-CM | POA: Insufficient documentation

## 2017-01-11 DIAGNOSIS — I110 Hypertensive heart disease with heart failure: Secondary | ICD-10-CM | POA: Insufficient documentation

## 2017-01-11 DIAGNOSIS — F419 Anxiety disorder, unspecified: Secondary | ICD-10-CM | POA: Insufficient documentation

## 2017-01-11 DIAGNOSIS — K219 Gastro-esophageal reflux disease without esophagitis: Secondary | ICD-10-CM | POA: Diagnosis not present

## 2017-01-11 DIAGNOSIS — E039 Hypothyroidism, unspecified: Secondary | ICD-10-CM | POA: Insufficient documentation

## 2017-01-11 DIAGNOSIS — I4891 Unspecified atrial fibrillation: Secondary | ICD-10-CM | POA: Diagnosis not present

## 2017-01-11 DIAGNOSIS — I5042 Chronic combined systolic (congestive) and diastolic (congestive) heart failure: Secondary | ICD-10-CM | POA: Diagnosis not present

## 2017-01-11 DIAGNOSIS — Z953 Presence of xenogenic heart valve: Secondary | ICD-10-CM | POA: Diagnosis not present

## 2017-01-11 DIAGNOSIS — J45909 Unspecified asthma, uncomplicated: Secondary | ICD-10-CM | POA: Diagnosis not present

## 2017-01-11 DIAGNOSIS — Z79899 Other long term (current) drug therapy: Secondary | ICD-10-CM | POA: Insufficient documentation

## 2017-01-11 DIAGNOSIS — Z7901 Long term (current) use of anticoagulants: Secondary | ICD-10-CM | POA: Diagnosis not present

## 2017-01-11 DIAGNOSIS — H9190 Unspecified hearing loss, unspecified ear: Secondary | ICD-10-CM | POA: Insufficient documentation

## 2017-01-11 DIAGNOSIS — I428 Other cardiomyopathies: Secondary | ICD-10-CM | POA: Diagnosis not present

## 2017-01-11 DIAGNOSIS — I481 Persistent atrial fibrillation: Secondary | ICD-10-CM | POA: Insufficient documentation

## 2017-01-11 DIAGNOSIS — Z9889 Other specified postprocedural states: Secondary | ICD-10-CM | POA: Diagnosis not present

## 2017-01-11 NOTE — Progress Notes (Signed)
Primary Care Physician: [name], MD Referring Physician: Dr. Vella Kohler is a 66 y.o. female with a h/o persistent atrial fibrillation who presents for follow up in the [facility]. She was recently seen   for refractory afib.She has had difficulty with hypothyroidism in the past as well as subsequent hypothyroidism.  She reports that her thyroid function was recently found to be abnormal  An echo was obtained which revealed a newly depressed EF as well as moderate to severe MR.  She does not feel well with afib.  She reports significant and progressive SOB.  She also has palpitations.  Her exercise tolerance is poor.  She was scheduled for TEE as well as Rt /Left heart cath.TEE showed EF 40% with diffuse hypokinesis with severe central MR due to inadequate leaflet coaptation, may be due to left atrial enlargement and resulting annular dilatation. Moderate AI with incomplete leaflet coaptation. Cath showed no angiographic coronary disease,severe MR. She is pending surgical consult with Dr. Cornelius Moras.  F/u in fib clinic, 9/13. She had aortic replacement/mitral valve repair as well as Maze procedure 04/13/16. She is in clinic for long term f/u surveillance for Maze procedure. She reports no further afib since surgery. Her shortness of breath and fatigue significantly improved. She continues on Eliquis 5 mg bid without bleeding issues.  Today, she denies symptoms of palpitations, chest pain, shortness of breath, orthopnea, PND, lower extremity edema, dizziness, presyncope, syncope, or neurologic sequela. The patient is tolerating medications without difficulties and is otherwise without complaint today.   Past Medical History:  Diagnosis Date  . AC (acromioclavicular) joint bone spurs    spurs in shoulders with pain  . Anxiety   . Aortic insufficiency   . Arthritis    all over  . Asthma    from reflux  . Chronic combined systolic and diastolic CHF  (congestive heart failure) (HCC)   . Deafness   . Dysrhythmia   . Fibromyalgia   . GERD (gastroesophageal reflux disease)   . Hypertension   . Hypothyroidism   . Mitral regurgitation   . Non-ischemic cardiomyopathy (HCC)    a. Normal cors 01/2016.  . Osteoarthritis of glenohumeral joint, left    Left shoulder  . Persistent atrial fibrillation (HCC)   . PFO (patent foramen ovale)    a. closure 03/2016.  . S/P aortic valve replacement with bioprosthetic valve 04/13/2016   23 mm [facility] Ease bovine pericardial tissue valve  . S/P mitral valve repair 04/13/2016   26 mm Sorin Memo 3D ring annuloplasty  . 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 Tricuspid regurgitation    Past Surgical History:  Procedure Laterality Date  . ABDOMINAL HYSTERECTOMY     partial  . AORTIC VALVE REPLACEMENT N/A 04/13/2016   Procedure: AORTIC VALVE REPLACEMENT (AVR) implanted with Magna Ease size 23mm;  Surgeon: Purcell Nails, MD;  Location: [facility] OR;  Service: Open Heart Surgery;  Laterality: N/A;  . CARDIAC CATHETERIZATION N/A 02/18/2016   Procedure: Right/Left Heart Cath and Coronary Angiography;  Surgeon: Laurey Morale, MD;  Location: [facility] INVASIVE CV LAB;  Service: Cardiovascular;  Laterality: N/A;  . CARDIOVERSION N/A 10/16/2014   Procedure: CARDIOVERSION;  Surgeon: Corky Crafts, MD;  Location: [facility] ENDOSCOPY;  Service: Cardiovascular;  Laterality: N/A;  . CARDIOVERSION N/A 12/04/2014   Procedure: CARDIOVERSION;  Surgeon: Thurmon Fair, MD;  Location: [facility] ENDOSCOPY;  Service: Cardiovascular;  Laterality: N/A;  . COLON SURGERY     spurs  . COLONOSCOPY  [id number]  . COLONOSCOPY WITH PROPOFOL  04/09/2012   Procedure: COLONOSCOPY WITH PROPOFOL;  Surgeon: Charolett Bumpers, MD;  Location: [facility] ENDOSCOPY;  Service: Endoscopy;  Laterality: N/A;  . ESOPHAGOGASTRODUODENOSCOPY ENDOSCOPY     several times  . EYE SURGERY     catarcts  . MAZE N/A 04/13/2016    Procedure: MAZE;  Surgeon: Purcell Nails, MD;  Location: [facility] OR;  Service: Open Heart Surgery;  Laterality: N/A;  . MITRAL VALVE REPAIR N/A 04/13/2016   Procedure: MITRAL VALVE REPAIR (MVR) using 26mm Sorin /MEMO 3D annuloplasty ring;  Surgeon: Purcell Nails, MD;  Location: [facility] OR;  Service: Open Heart Surgery;  Laterality: N/A;  . PATENT FORAMEN OVALE CLOSURE N/A 04/13/2016   Procedure: PATENT FORAMEN OVALE (PFO) CLOSURE;  Surgeon: Purcell Nails, MD;  Location: [facility] OR;  Service: Open Heart Surgery;  Laterality: N/A;  . TEE WITHOUT CARDIOVERSION N/A 02/18/2016   Procedure: TRANSESOPHAGEAL ECHOCARDIOGRAM (TEE);  Surgeon: Laurey Morale, MD;  Location: [facility] ENDOSCOPY;  Service: Cardiovascular;  Laterality: N/A;  . TEE WITHOUT CARDIOVERSION N/A 04/13/2016   Procedure: TRANSESOPHAGEAL ECHOCARDIOGRAM (TEE);  Surgeon: Purcell Nails, MD;  Location: [facility] OR;  Service: Open Heart Surgery;  Laterality: N/A;  . THYROID SURGERY  1991  . TONSILLECTOMY    . TOTAL SHOULDER ARTHROPLASTY Right 07/02/2012   Procedure: TOTAL SHOULDER ARTHROPLASTY;  Surgeon: Mable Paris, MD;  Location: [facility] OR;  Service: Orthopedics;  Laterality: Right;  Right total shoulder arthroplasty  . TOTAL SHOULDER ARTHROPLASTY Left 11/16/2016  . TOTAL SHOULDER ARTHROPLASTY Left 11/16/2016   Procedure: TOTAL SHOULDER ARTHROPLASTY;  Surgeon: Jones Broom, MD;  Location: [facility] OR;  Service: Orthopedics;  Laterality: Left;  Left total shoulder arthroplasty  . TRICUSPID VALVE REPLACEMENT N/A 04/13/2016   Procedure: TRICUSPID VALVE REPAIR using a T26 MC3 Edwards annuloplasty ring;  Surgeon: Purcell Nails, MD;  Location: [facility] OR;  Service: Open Heart Surgery;  Laterality: N/A;  . TUBAL LIGATION    . vericose surgery Right leg  05-1998  . WISDOM TOOTH EXTRACTION      Current Outpatient Prescriptions  Medication Sig Dispense Refill  . ALPRAZolam (XANAX) 0.25 MG tablet Take 0.25 mg by mouth at bedtime as needed for anxiety.    Marland Kitchen  apixaban (ELIQUIS) 5 MG TABS tablet Take 5 mg by mouth 2 (two) times daily.    Marland Kitchen aspirin EC 81 MG EC tablet Take 1 tablet (81 mg total) by mouth daily.    Marland Kitchen atorvastatin (LIPITOR) 20 MG tablet Take 20 mg by mouth daily.    . Calcium Carbonate-Vitamin D (CALTRATE 600+D) 600-400 MG-UNIT per tablet Take 1 tablet by mouth daily.     . carvedilol (COREG) 12.5 MG tablet Take 1 tablet (12.5 mg total) by mouth 2 (two) times daily with a meal. 180 tablet 3  . cetirizine (ZYRTEC) 10 MG tablet Take 10 mg by mouth daily as needed for allergies.    . Cyanocobalamin (VITAMIN B-12) 5000 MCG SUBL Place 1 tablet under the tongue daily. For nerve pain in feet and metabolism support     . diclofenac (VOLTAREN) 75 MG EC tablet Take 75 mg by mouth daily as needed for moderate pain.    . diphenhydrAMINE (BENADRYL) 25 mg capsule Take 25 mg by mouth daily as needed for itching.     . ferrous sulfate 325 (65 FE) MG tablet Take 1 tablet (325 mg total)  by mouth 2 (two) times daily with a meal. (Patient taking differently: Take 325 mg by mouth daily. ) 180 tablet 3  . fluticasone (FLONASE) 50 MCG/ACT nasal spray Place 2 sprays into both nostrils daily.    . furosemide (LASIX) 40 MG tablet Take 1 tablet (40 mg total) by mouth daily. (Patient taking differently: Take 20 mg by mouth daily. ) 90 tablet 3  . gabapentin (NEURONTIN) 600 MG tablet TAKE 1 TABLET THREE TIMES A DAY 90 tablet 5  . levothyroxine (SYNTHROID, LEVOTHROID) 125 MCG tablet Take 125 mcg by mouth daily before breakfast.    . LORazepam (ATIVAN) 2 MG tablet Take 1 tablet (2 mg total) by mouth at bedtime. For restful sleep (Patient taking differently: Take 1-2 mg by mouth See admin instructions. Take 1 mg by mouth in the morning as needed for anxiety and take 2 mg by mouth at bedtime) 10 tablet 0  . montelukast (SINGULAIR) 10 MG tablet Take 10 mg by mouth daily before breakfast.     . omeprazole (PRILOSEC) 40 MG capsule Take 40 mg by mouth daily.     . polyvinyl  alcohol (LIQUIFILM TEARS) 1.4 % ophthalmic solution Place 1 drop into both eyes daily as needed for dry eyes.     . potassium chloride (KLOR-CON) 8 MEQ tablet Take 8 mEq by mouth daily.    . promethazine (PHENERGAN) 25 MG tablet Take 25 mg by mouth 3 (three) times daily as needed for nausea or vomiting.     Marland Kitchen tiZANidine (ZANAFLEX) 2 MG tablet Take 1 tablet (2 mg total) by mouth 2 (two) times daily as needed for muscle spasms. (Patient taking differently: Take 2 mg by mouth 3 (three) times daily as needed for muscle spasms. ) 10 tablet 0  . traMADol (ULTRAM) 50 MG tablet Take 50-100 mg by mouth 2 (two) times daily as needed for moderate pain.     Marland Kitchen oxyCODONE (OXY IR/ROXICODONE) 5 MG immediate release tablet Take 1-2 tablets (5-10 mg total) by mouth every 4 (four) hours as needed for severe pain. (Patient not taking: Reported on 01/11/2017) 30 tablet 0   No current facility-administered medications for this encounter.     Allergies  Allergen Reactions  . Biaxin [Clarithromycin] Nausea Only and Other (See Comments)    Dizzy, blurred vision, achy stomach  . Levaquin [Levofloxacin] Nausea Only and Other (See Comments)    Dizzy, achy stomach, can't sleep  . Trazodone And Nefazodone Other (See Comments)    Weakness in legs Numbness in arms Funny feeling Rapid heart beat Lose focus  . Sulfa Antibiotics Other (See Comments)    Blister on large toe  . Aleve [Naproxen] Nausea And Vomiting and Other (See Comments)    Stomach ache  . Bextra [Valdecoxib] Nausea And Vomiting and Other (See Comments)    Stomach ache   . Doxycycline Nausea Only and Other (See Comments)    Stomach ache  . Durabac [Apap-Salicyl-Phenyltolox-Caff] Nausea And Vomiting  . Estradiol Nausea Only and Other (See Comments)    Hurt stomach  . Macrobid [Nitrofurantoin] Other (See Comments)    BLOATING  . Motrin [Ibuprofen] Nausea Only and Other (See Comments)    Stomach ahce  . Oxaprozin Nausea Only and Other (See Comments)      Hurt stomach  . Robaxin [Methocarbamol] Nausea Only and Other (See Comments)    Hurt stomach  . Topamax [Topiramate] Nausea And Vomiting    Social History   Social History  . Marital status: Married  Spouse name: N/A  . Number of children: N/A  . Years of education: N/A   Occupational History  . Not on file.   Social History Main Topics  . Smoking status: Never Smoker  . Smokeless tobacco: Never Used  . Alcohol use No  . Drug use: No  . Sexual activity: Not on file   Other Topics Concern  . Not on file   Social History Narrative  . No narrative on file    Family History  Problem Relation Age of Onset  . Lung cancer Father   . Arrhythmia Father   . Hypertension Father   . Arrhythmia Mother   . Hypertension Mother   . Stroke Maternal Grandmother   . Hypertension Maternal Grandmother   . Heart attack Maternal Uncle        X2  . Kidney disease Paternal Grandfather     ROS- All systems are reviewed and negative except as per the HPI above  Physical Exam: Vitals:   01/11/17 1114  BP: 116/74  Pulse: 74  Weight: 198 lb 12.8 oz (90.2 kg)  Height: 5' 7.5" (1.715 m)    GEN- The patient is well appearing, alert and oriented x 3 today.   Head- normocephalic, atraumatic Eyes-  Sclera clear, conjunctiva pink Ears- hearing intact Oropharynx- clear Neck- supple, no JVP Lymph- no cervical lymphadenopathy Lungs- Clear to ausculation bilaterally, normal work of breathing Heart- irregular rate and rhythm, no murmurs, rubs or gallops, PMI not laterally displaced GI- soft, NT, ND, + BS Extremities- no clubbing, cyanosis, or edema MS- no significant deformity or atrophy Skin- no rash or lesion Psych- euthymic mood, full affect Neuro- strength and sensation are intact  EKG- NSR at 74 bpm, pr int 170 ms, qrs int 88 ms, qtc 457 ms Epic records reviewed 05/11/16- s/p surgery Echo-------------------------------------------------------------------- Study  Conclusions  - Left ventricle: The cavity size was normal. Wall thickness was   normal. Systolic function was normal. The estimated ejection   fraction was in the range of 50% to 55%. Wall motion was normal;   there were no regional wall motion abnormalities. - Aortic valve: A bioprosthesis was present. - Mitral valve: A bioprosthesis was present. - Left atrium: The atrium was moderately dilated. - Tricuspid valve: Prior procedures included surgical repair.  Assessment and Plan: 1. Persistent symptomatic afib S/p maze procedure 03/2016 Has been maintaining SR Intolerant of 1c agents in past due to abdominal pain Continue carvedilol Continue apixaban  bid for a chadsvasc score of at least 4  2. AV replacement/MV repair Symptoms of shortness of breath and fatigue much improved since surgery  F/u echo showed normal functioning valves and normal EF   F/u afib clinic in one year for long term afib surveillance  F/u with Dr. Cornelius Moras as scheduled  12/17 F/u with Dr. Eldridge Dace 12/17  Lupita Leash [facility] [address] [place], [place] [address] [phone]

## 2017-01-16 ENCOUNTER — Encounter: Payer: Self-pay | Admitting: Vascular Surgery

## 2017-01-16 ENCOUNTER — Ambulatory Visit (HOSPITAL_COMMUNITY)
Admission: RE | Admit: 2017-01-16 | Discharge: 2017-01-16 | Disposition: A | Discharge status: Home / Self Care | Payer: Medicare Other | Source: Ambulatory Visit | Attending: Vascular Surgery | Admitting: Vascular Surgery

## 2017-01-16 ENCOUNTER — Ambulatory Visit (INDEPENDENT_AMBULATORY_CARE_PROVIDER_SITE_OTHER): Payer: Medicare Other | Admitting: Vascular Surgery

## 2017-01-16 VITALS — BP 121/79 | HR 90 | Temp 97.1°F | Resp 16 | Ht 67.0 in | Wt 198.0 lb

## 2017-01-16 DIAGNOSIS — I8393 Asymptomatic varicose veins of bilateral lower extremities: Secondary | ICD-10-CM | POA: Insufficient documentation

## 2017-01-16 DIAGNOSIS — I83893 Varicose veins of bilateral lower extremities with other complications: Secondary | ICD-10-CM | POA: Insufficient documentation

## 2017-01-16 DIAGNOSIS — M7989 Other specified soft tissue disorders: Secondary | ICD-10-CM | POA: Diagnosis present

## 2017-01-16 NOTE — Progress Notes (Signed)
Subjective:     Patient ID: Allison [name], female   DOB: 07/25/1950, 66 y.o.   MRN: [id number]  HPI This 66 year old female was referred by Dr. Eula Listen for evaluation of bilateral symptomatic spider veins. Patient states she had a venous procedure on her right leg many years ago below the knee for a bulging vein. She has multiple spider veins in both legs and she describes stinging sensation overlying these particularly in the right leg. The symptoms are very transient in nature. She has no history of DVT thrombophlebitis stasis ulcers or bleeding. She had a cardiac valve replacement performed in December 2017 and since that time she has had a patch of numbness on her right lateral thigh area. There is no pain related to this. She does not were elastic compression stockings. She denies distal edema.  Past Medical History:  Diagnosis Date  . AC (acromioclavicular) joint bone spurs    spurs in shoulders with pain  . Anxiety   . Aortic insufficiency   . Arthritis    all over  . Asthma    from reflux  . Chronic combined systolic and diastolic CHF (congestive heart failure) (HCC)   . Deafness   . Dysrhythmia   . Fibromyalgia   . GERD (gastroesophageal reflux disease)   . Hypertension   . Hypothyroidism   . Mitral regurgitation   . Non-ischemic cardiomyopathy (HCC)    a. Normal cors 01/2016.  . Osteoarthritis of glenohumeral joint, left    Left shoulder  . Persistent atrial fibrillation (HCC)   . PFO (patent foramen ovale)    a. closure 03/2016.  . S/P aortic valve replacement with bioprosthetic valve 04/13/2016   23 mm [facility] Ease bovine pericardial tissue valve  . S/P mitral valve repair 04/13/2016   26 mm Sorin Memo 3D ring annuloplasty  . 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 Tricuspid regurgitation     Social History  Substance Use Topics  . Smoking status: Never Smoker  . Smokeless tobacco: Never Used  . Alcohol use No     Family History  Problem Relation Age of Onset  . Lung cancer Father   . Arrhythmia Father   . Hypertension Father   . Arrhythmia Mother   . Hypertension Mother   . Stroke Maternal Grandmother   . Hypertension Maternal Grandmother   . Heart attack Maternal Uncle        X2  . Kidney disease Paternal Grandfather     Allergies  Allergen Reactions  . Biaxin [Clarithromycin] Nausea Only and Other (See Comments)    Dizzy, blurred vision, achy stomach  . Levaquin [Levofloxacin] Nausea Only and Other (See Comments)    Dizzy, achy stomach, can't sleep  . Trazodone And Nefazodone Other (See Comments)    Weakness in legs Numbness in arms Funny feeling Rapid heart beat Lose focus  . Sulfa Antibiotics Other (See Comments)    Blister on large toe  . Aleve [Naproxen] Nausea And Vomiting and Other (See Comments)    Stomach ache  . Bextra [Valdecoxib] Nausea And Vomiting and Other (See Comments)    Stomach ache   . Doxycycline Nausea Only and Other (See Comments)    Stomach ache  . Durabac [Apap-Salicyl-Phenyltolox-Caff] Nausea And Vomiting  . Estradiol Nausea Only and Other (See Comments)    Hurt stomach  . Macrobid [Nitrofurantoin] Other (See Comments)    BLOATING  . Motrin [Ibuprofen] Nausea Only and Other (See  Comments)    Stomach ahce  . Oxaprozin Nausea Only and Other (See Comments)    Hurt stomach  . Robaxin [Methocarbamol] Nausea Only and Other (See Comments)    Hurt stomach  . Topamax [Topiramate] Nausea And Vomiting     Current Outpatient Prescriptions:  .  ALPRAZolam (XANAX) 0.25 MG tablet, Take 0.25 mg by mouth at bedtime as needed for anxiety., Disp: , Rfl:  .  apixaban (ELIQUIS) 5 MG TABS tablet, Take 5 mg by mouth 2 (two) times daily., Disp: , Rfl:  .  aspirin EC 81 MG EC tablet, Take 1 tablet (81 mg total) by mouth daily., Disp: , Rfl:  .  atorvastatin (LIPITOR) 20 MG tablet, Take 20 mg by mouth daily., Disp: , Rfl:  .  Calcium Carbonate-Vitamin D (CALTRATE  600+D) 600-400 MG-UNIT per tablet, Take 1 tablet by mouth daily. , Disp: , Rfl:  .  carvedilol (COREG) 12.5 MG tablet, Take 1 tablet (12.5 mg total) by mouth 2 (two) times daily with a meal., Disp: 180 tablet, Rfl: 3 .  cetirizine (ZYRTEC) 10 MG tablet, Take 10 mg by mouth daily as needed for allergies., Disp: , Rfl:  .  Cyanocobalamin (VITAMIN B-12) 5000 MCG SUBL, Place 1 tablet under the tongue daily. For nerve pain in feet and metabolism support , Disp: , Rfl:  .  diclofenac (VOLTAREN) 75 MG EC tablet, Take 75 mg by mouth daily as needed for moderate pain., Disp: , Rfl:  .  diphenhydrAMINE (BENADRYL) 25 mg capsule, Take 25 mg by mouth daily as needed for itching. , Disp: , Rfl:  .  ferrous sulfate 325 (65 FE) MG tablet, Take 1 tablet (325 mg total) by mouth 2 (two) times daily with a meal. (Patient taking differently: Take 325 mg by mouth daily. ), Disp: 180 tablet, Rfl: 3 .  fluticasone (FLONASE) 50 MCG/ACT nasal spray, Place 2 sprays into both nostrils daily., Disp: , Rfl:  .  furosemide (LASIX) 40 MG tablet, Take 1 tablet (40 mg total) by mouth daily. (Patient taking differently: Take 20 mg by mouth daily. ), Disp: 90 tablet, Rfl: 3 .  gabapentin (NEURONTIN) 600 MG tablet, TAKE 1 TABLET THREE TIMES A DAY, Disp: 90 tablet, Rfl: 5 .  levothyroxine (SYNTHROID, LEVOTHROID) 125 MCG tablet, Take 125 mcg by mouth daily before breakfast., Disp: , Rfl:  .  LORazepam (ATIVAN) 2 MG tablet, Take 1 tablet (2 mg total) by mouth at bedtime. For restful sleep (Patient taking differently: Take 1-2 mg by mouth See admin instructions. Take 1 mg by mouth in the morning as needed for anxiety and take 2 mg by mouth at bedtime), Disp: 10 tablet, Rfl: 0 .  montelukast (SINGULAIR) 10 MG tablet, Take 10 mg by mouth daily before breakfast. , Disp: , Rfl:  .  omeprazole (PRILOSEC) 40 MG capsule, Take 40 mg by mouth daily. , Disp: , Rfl:  .  oxyCODONE (OXY IR/ROXICODONE) 5 MG immediate release tablet, Take 1-2 tablets (5-10  mg total) by mouth every 4 (four) hours as needed for severe pain. (Patient not taking: Reported on 01/11/2017), Disp: 30 tablet, Rfl: 0 .  polyvinyl alcohol (LIQUIFILM TEARS) 1.4 % ophthalmic solution, Place 1 drop into both eyes daily as needed for dry eyes. , Disp: , Rfl:  .  potassium chloride (KLOR-CON) 8 MEQ tablet, Take 8 mEq by mouth daily., Disp: , Rfl:  .  promethazine (PHENERGAN) 25 MG tablet, Take 25 mg by mouth 3 (three) times daily as needed for  nausea or vomiting. , Disp: , Rfl:  .  tiZANidine (ZANAFLEX) 2 MG tablet, Take 1 tablet (2 mg total) by mouth 2 (two) times daily as needed for muscle spasms. (Patient taking differently: Take 2 mg by mouth 3 (three) times daily as needed for muscle spasms. ), Disp: 10 tablet, Rfl: 0 .  traMADol (ULTRAM) 50 MG tablet, Take 50-100 mg by mouth 2 (two) times daily as needed for moderate pain. , Disp: , Rfl:   Vitals:   01/16/17 1305  BP: 121/79  Pulse: 90  Resp: 16  Temp: (!) 97.1 F (36.2 C)  SpO2: 95%  Weight: 198 lb (89.8 kg)  Height:  (1.702 m)    Body mass index is 31.01 kg/m.         Review of Systems Denies chest pain, dyspnea on exertion, PND, orthopnea. Patient is deaf and is accompanied by an interpreter. Has history of valve replacement surgery, atrial fibrillation and is on chronic anticoagulation. Also history of nonischemic cardiomyopathy    Objective:   Physical Exam BP 121/79   Pulse 90   Temp (!) 97.1 F (36.2 C)   Resp 16   Ht  (1.702 m)   Wt 198 lb (89.8 kg)   SpO2 95%   BMI 31.01 kg/m   BP 121/79   Pulse 90   Temp (!) 97.1 F (36.2 C)   Resp 16   Ht  (1.702 m)   Wt 198 lb (89.8 kg)   SpO2 95%   BMI 31.01 kg/m     Gen.-alert and oriented x3 in no apparent distress-patient is deaf and is accompanied by interpreter  HEENT normal for age Lungs no rhonchi or wheezing Cardiovascular regular rhythm no murmurs carotid pulses 3+ palpable no bruits audible Abdomen soft nontender  no palpable masses Musculoskeletal free of  major deformities Skin clear -no rashes Neurologic normal Lower extremities 3+ femoral and dorsalis pedis pulses palpable bilaterally with no edema Both lower extremities with scattered small areas of spider telangiectasia over about 2 cm to 3 cm diameter. No ulceration. No bulging varicosities noted. No hyper pigmentation or ulceration noted.  Today I ordered a venous duplex exam of both legs which reviewed and interpreted. There is no DVT. There is no significant reflux in the bilateral great saphenous or small saphenous veins both of which are present       Assessment:     1 spider veins both lower extremities with mild symptoms of itching and stinging which are transient in nature #2 history of tricuspid valve replacement or repair-December 2017 #3 history of atrial fibrillation-on chronic anticoagulation #4 fibromyalgia #5 area of numbness right lateral thighs which sounds like a nerve compression symptomatology-etiology unknown    Plan:     No treatment indicated for patient's diffuse spider veins and no need for any further evaluation of arterial or venous system Return on a when necessary basis

## 2017-01-30 ENCOUNTER — Telehealth: Payer: Self-pay

## 2017-01-30 ENCOUNTER — Ambulatory Visit (INDEPENDENT_AMBULATORY_CARE_PROVIDER_SITE_OTHER): Payer: Medicare Other | Admitting: Pharmacist

## 2017-01-30 DIAGNOSIS — I4891 Unspecified atrial fibrillation: Secondary | ICD-10-CM

## 2017-01-30 NOTE — Telephone Encounter (Signed)
**Note De-identified  Obfuscation** -----  **Note De-Identified  Obfuscation** Message from Awilda Metro, [facility] sent at 01/30/2017 11:49 AM EDT ----- Pt came in today for Eliquis visit and mentioned she is getting her Eliquis from [employer]. She does not think she has been getting refills - is there any way you can check on the status of her application and make sure everything is set for her to continue getting her Eliquis? Thank you!

## 2017-01-30 NOTE — Telephone Encounter (Signed)
**Note De-Identified  Obfuscation** Left message on the pts VM through her answering service (the pt is deaf) asking her to call us concerning her Eliquis.

## 2017-01-30 NOTE — Telephone Encounter (Signed)
Follow up    Relay phone interpret service returning call with patient. Please call

## 2017-01-30 NOTE — Progress Notes (Addendum)
Pt was started on Eliquis 5mg  BID in 2016 for afib.   Reviewed patients medication list.  Pt is not currently on any combined P-gp and strong CYP3A4 inhibitors/inducers (ketoconazole, traconazole, ritonavir, carbamazepine, phenytoin, rifampin, St. John's wort).  Reviewed labs.  SCr 1.14, Weight 198 lbs, age 66.  Dose appropriate based on age, weight, and SCr.  Hgb and HCT both WNL.  A full discussion of the nature of anticoagulants has been carried out.  A benefit/risk analysis has been presented to the patient, so that they understand the justification for choosing anticoagulation with Eliquis at this time.  The need for compliance is stressed.  Pt is aware to take the medication twice daily.  Side effects of potential bleeding are discussed, including unusual colored urine or stools, coughing up blood or coffee ground emesis, nose bleeds or serious fall or head trauma.  Discussed signs and symptoms of stroke. The patient should avoid any OTC items containing aspirin or ibuprofen.  Avoid alcohol consumption.   Call if any signs of abnormal bleeding.  Discussed financial obligations and resolved any difficulty in obtaining medication.  Next lab test in 6 months.   LMOM that labs are normal and for pt to call back with concerns.

## 2017-01-30 NOTE — Telephone Encounter (Signed)
I called and left a message on the pts VM through her answering service asking her to call me back concerning helping her get her Eliquis.

## 2017-01-31 ENCOUNTER — Telehealth: Payer: Self-pay | Admitting: Interventional Cardiology

## 2017-01-31 LAB — BASIC METABOLIC PANEL
BUN / CREAT RATIO: 14 (ref 12–28)
BUN: 16 mg/dL (ref 8–27)
CALCIUM: 9.4 mg/dL (ref 8.7–10.3)
CO2: 25 mmol/L (ref 20–29)
CREATININE: 1.14 mg/dL — AB (ref 0.57–1.00)
Chloride: 100 mmol/L (ref 96–106)
GFR calc Af Amer: 57 mL/min/{1.73_m2} — ABNORMAL LOW (ref >59)
GFR calc non Af Amer: 50 mL/min/{1.73_m2} — ABNORMAL LOW (ref >59)
GLUCOSE: 84 mg/dL (ref 65–99)
Potassium: 4.2 mmol/L (ref 3.5–5.2)
Sodium: 141 mmol/L (ref 134–144)

## 2017-01-31 LAB — CBC
HEMATOCRIT: 41.3 % (ref 34.0–46.6)
HEMOGLOBIN: 13.4 g/dL (ref 11.1–15.9)
MCH: 29.9 pg (ref 26.6–33.0)
MCHC: 32.4 g/dL (ref 31.5–35.7)
MCV: 92 fL (ref 79–97)
Platelets: 245 10*3/uL (ref 150–379)
RBC: 4.48 x10E6/uL (ref 3.77–5.28)
RDW: 13.3 % (ref 12.3–15.4)
WBC: 6.2 10*3/uL (ref 3.4–10.8)

## 2017-01-31 NOTE — Telephone Encounter (Signed)
°  New Prob  Requesting to speak to you regarding Eliquis. Please call.

## 2017-02-01 ENCOUNTER — Other Ambulatory Visit: Payer: Self-pay | Admitting: Internal Medicine

## 2017-02-01 DIAGNOSIS — R1310 Dysphagia, unspecified: Secondary | ICD-10-CM

## 2017-02-02 ENCOUNTER — Other Ambulatory Visit: Payer: Self-pay

## 2017-02-02 MED ORDER — APIXABAN 5 MG PO TABS: 5.0000 mg | ORAL_TABLET | Freq: Two times a day (BID) | ORAL | 3 refills | Status: DC

## 2017-02-02 NOTE — Telephone Encounter (Signed)
I left another message through the pts answering service asking the pt to call me back concerning her Eliquis.

## 2017-02-02 NOTE — Telephone Encounter (Signed)
**Note De-Identified  Obfuscation** The pt states that she only has 1 Eliquis tablet left. She also states that she had Pt assistance through [employer] for Eliquis but that she received a letter stating that they need a note from Dr Soledad Gerlach stating that she needs this medication. I asked her to bring me the letter because the office is unaware of her having pt assistance.  I gave her BMS phone number to call and find out what they need and to ask that they contact the office with what they are needing from [place]. She is advised that Im leaving her 2 bottles of samples along with a BMS application (incase an application is still needed by BMS) in the front office and that she can pick up at her convenience today.

## 2017-02-02 NOTE — Telephone Encounter (Signed)
See phone note from 10/2.

## 2017-02-05 ENCOUNTER — Other Ambulatory Visit: Payer: Self-pay

## 2017-02-05 MED ORDER — APIXABAN 5 MG PO TABS: 5.0000 mg | ORAL_TABLET | Freq: Two times a day (BID) | ORAL | 3 refills | Status: DC

## 2017-02-05 NOTE — Telephone Encounter (Signed)
The pt states that she does have the samples and BMS pt assistance application that I left for her to pick up from our front office.  I have went over the application with her in an attempt to help her fill it out. She states that she will complete application and bring it back to the office. She is aware that I will fax her application along with an RX for her Eliquis and signed provider part of application to BMS once I receive all.

## 2017-02-05 NOTE — Telephone Encounter (Signed)
I Communicated with pt through interpreter line (pt uses sign language video for calls) I gave pt a fax number and direct number to reach me. I informed her to have the [facility] application sent to me On 02/05/2017 I received the fax application addressed to me from Corena Herter I made a copy and sent it to the pt. I called the pt twice and left a Video message through the interpreter (both calls was not answered)  that I have successfully received fax and sent her a copy in the mail, also that I will let Dr.Varanasi and his RN be  aware that I received it and she has her copy in the mail.

## 2017-02-08 ENCOUNTER — Ambulatory Visit
Admission: RE | Admit: 2017-02-08 | Discharge: 2017-02-08 | Disposition: A | Discharge status: Home / Self Care | Payer: Medicare Other | Source: Ambulatory Visit | Attending: Internal Medicine | Admitting: Internal Medicine

## 2017-02-08 DIAGNOSIS — R1310 Dysphagia, unspecified: Secondary | ICD-10-CM

## 2017-02-19 NOTE — Progress Notes (Signed)
Cardiology Office Note   Date:  02/20/2017   ID:  Allison [name], DOB [birth year], MRN [id number]  PCP:  [name], MD    No chief complaint on file.  S/p mitral valve repair  Wt Readings from Last 3 Encounters:  02/20/17 204 lb 12.8 oz (92.9 kg)  01/16/17 198 lb (89.8 kg)  01/11/17 198 lb 12.8 oz (90.2 kg)       History of Present Illness: Allison [name] is a 66 y.o. female  with a hx of deafness, paroxysmal atrial fibrillation with associated hyperthyroidism several years ago, HTN, fibromyalgia, asthma, GERD.   She was noted to be back in atrial fibrillation in May 2016. She was placed on Eliquis and set up for cardioversion. She was maintaining NSR after cardioversion. She walked into the office 12/01/14 with complaints of dizziness and an EKG confirmed recurrent atrial fibrillation. She felt poorly with this including dizziness and near syncope at times and increased DOE. We put her on Flecainide and planned a DCCV. But, she had significant symptoms after her first dose of Flecainide. She had severe abdominal pain. She was referred to the AFib Clinic. She was seen by Rudi Coco, NP and Dr. Johney Frame. She was switched to Propafenone 150 mg TID. She underwent DCCV as previously arranged on 12/04/14. She had return of NSR. She was seen back in the AFib Clinic 8/8 and noted abdominal bloating and constipation from the Propafenone. This drug was also stopped. It was not felt that she would be a good candidate for Multaq. She would require a washout period after stopping Propafenone before Sotalol could be considered (it would have to be started while in NSR as an OP per [employer] note). She noted difficulty planning an admission for Tikosyn Rx.  Echo showed severe MR.  THis was repaired in 12/17 with maze procedure and clipping of LAA.    EF was decreased.  She was placed on heart failure meds.  Exercise tolerance improved post surgery. Had some bleeding with  warfarin but is now doing better on Eliquis.  She was maintaining NSR at her last visit.     SHe was intolerant of flecainide and propafenone in the past.  Since the last visit, she has had some right leg swelling.  Better with right leg elevation.   Denies : Chest pain. Dizziness. Leg edema. Nitroglycerin use. Orthopnea. Palpitations. Paroxysmal nocturnal dyspnea. Shortness of breath. Syncope.      Past Medical History:  Diagnosis Date  . AC (acromioclavicular) joint bone spurs    spurs in shoulders with pain  . Anxiety   . Aortic insufficiency   . Arthritis    all over  . Asthma    from reflux  . Chronic combined systolic and diastolic CHF (congestive heart failure) (HCC)   . Deafness   . Dysrhythmia   . Fibromyalgia   . GERD (gastroesophageal reflux disease)   . Hypertension   . Hypothyroidism   . Mitral regurgitation   . Non-ischemic cardiomyopathy (HCC)    a. Normal cors 01/2016.  . Osteoarthritis of glenohumeral joint, left    Left shoulder  . Persistent atrial fibrillation (HCC)   . PFO (patent foramen ovale)    a. closure 03/2016.  . S/P aortic valve replacement with bioprosthetic valve 04/13/2016   23 mm [facility] Ease bovine pericardial tissue valve  . S/P mitral valve repair 04/13/2016   26 mm Sorin Memo 3D ring annuloplasty  . S/P tricuspid valve repair  04/13/2016   26 mm Edwards mc3 ring annuloplasty  . Tricuspid regurgitation     Past Surgical History:  Procedure Laterality Date  . ABDOMINAL HYSTERECTOMY     partial  . AORTIC VALVE REPLACEMENT N/A 04/13/2016   Procedure: AORTIC VALVE REPLACEMENT (AVR) implanted with Magna Ease size 31mm;  Surgeon: Purcell Nails, MD;  Location: [facility] OR;  Service: Open Heart Surgery;  Laterality: N/A;  . CARDIAC CATHETERIZATION N/A 02/18/2016   Procedure: Right/Left Heart Cath and Coronary Angiography;  Surgeon: Laurey Morale, MD;  Location: [facility] INVASIVE CV LAB;  Service: Cardiovascular;  Laterality: N/A;  .  CARDIOVERSION N/A 10/16/2014   Procedure: CARDIOVERSION;  Surgeon: Corky Crafts, MD;  Location: [facility] ENDOSCOPY;  Service: Cardiovascular;  Laterality: N/A;  . CARDIOVERSION N/A 12/04/2014   Procedure: CARDIOVERSION;  Surgeon: Thurmon Fair, MD;  Location: [facility] ENDOSCOPY;  Service: Cardiovascular;  Laterality: N/A;  . COLON SURGERY     spurs  . COLONOSCOPY  [id number]  . COLONOSCOPY WITH PROPOFOL  04/09/2012   Procedure: COLONOSCOPY WITH PROPOFOL;  Surgeon: Charolett Bumpers, MD;  Location: [facility] ENDOSCOPY;  Service: Endoscopy;  Laterality: N/A;  . ESOPHAGOGASTRODUODENOSCOPY ENDOSCOPY     several times  . EYE SURGERY     catarcts  . MAZE N/A 04/13/2016   Procedure: MAZE;  Surgeon: Purcell Nails, MD;  Location: [facility] OR;  Service: Open Heart Surgery;  Laterality: N/A;  . MITRAL VALVE REPAIR N/A 04/13/2016   Procedure: MITRAL VALVE REPAIR (MVR) using 32mm Sorin /MEMO 3D annuloplasty ring;  Surgeon: Purcell Nails, MD;  Location: [facility] OR;  Service: Open Heart Surgery;  Laterality: N/A;  . PATENT FORAMEN OVALE CLOSURE N/A 04/13/2016   Procedure: PATENT FORAMEN OVALE (PFO) CLOSURE;  Surgeon: Purcell Nails, MD;  Location: [facility] OR;  Service: Open Heart Surgery;  Laterality: N/A;  . TEE WITHOUT CARDIOVERSION N/A 02/18/2016   Procedure: TRANSESOPHAGEAL ECHOCARDIOGRAM (TEE);  Surgeon: Laurey Morale, MD;  Location: [facility] ENDOSCOPY;  Service: Cardiovascular;  Laterality: N/A;  . TEE WITHOUT CARDIOVERSION N/A 04/13/2016   Procedure: TRANSESOPHAGEAL ECHOCARDIOGRAM (TEE);  Surgeon: Purcell Nails, MD;  Location[facility] OR;  Service: Open Heart Surgery;  Laterality: N/A;  . THYROID SURGERY  1991  . TONSILLECTOMY    . TOTAL SHOULDER ARTHROPLASTY Right 07/02/2012   Procedure: TOTAL SHOULDER ARTHROPLASTY;  Surgeon: Mable Paris, MD;  Location: [facility] OR;  Service: Orthopedics;  Laterality: Right;  Right total shoulder arthroplasty  . TOTAL SHOULDER ARTHROPLASTY Left 11/16/2016  . TOTAL SHOULDER ARTHROPLASTY Left  11/16/2016   Procedure: TOTAL SHOULDER ARTHROPLASTY;  Surgeon: Jones Broom, MD;  Location: [facility] OR;  Service: Orthopedics;  Laterality: Left;  Left total shoulder arthroplasty  . TRICUSPID VALVE REPLACEMENT N/A 04/13/2016   Procedure: TRICUSPID VALVE REPAIR using a T26 MC3 Edwards annuloplasty ring;  Surgeon: Purcell Nails, MD;  Location: [facility] OR;  Service: Open Heart Surgery;  Laterality: N/A;  . TUBAL LIGATION    . vericose surgery Right leg  05-1998  . WISDOM TOOTH EXTRACTION       Current Outpatient Prescriptions  Medication Sig Dispense Refill  . apixaban (ELIQUIS) 5 MG TABS tablet Take 1 tablet (5 mg total) by mouth 2 (two) times daily. 180 tablet 3  . aspirin EC 81 MG EC tablet Take 1 tablet (81 mg total) by mouth daily.    Marland Kitchen atorvastatin (LIPITOR) 20 MG tablet Take 20 mg by mouth daily.    . Calcium Carbonate-Vitamin D (CALTRATE 600+D) 600-400 MG-UNIT per  tablet Take 1 tablet by mouth daily.     . carvedilol (COREG) 12.5 MG tablet Take 1 tablet (12.5 mg total) by mouth 2 (two) times daily with a meal. 180 tablet 3  . cetirizine (ZYRTEC) 10 MG tablet Take 10 mg by mouth daily as needed for allergies.    . Cyanocobalamin (VITAMIN B-12) 5000 MCG SUBL Place 1 tablet under the tongue daily. For nerve pain in feet and metabolism support     . diclofenac (VOLTAREN) 75 MG EC tablet Take 75 mg by mouth daily as needed for moderate pain.    . diphenhydrAMINE (BENADRYL) 25 mg capsule Take 25 mg by mouth daily as needed for itching.     . ferrous sulfate 325 (65 FE) MG tablet Take 1 tablet (325 mg total) by mouth 2 (two) times daily with a meal. (Patient taking differently: Take 325 mg by mouth daily. ) 180 tablet 3  . fluticasone (FLONASE) 50 MCG/ACT nasal spray Place 2 sprays into both nostrils daily.    . furosemide (LASIX) 40 MG tablet Take 1 tablet (40 mg total) by mouth daily. (Patient taking differently: Take 20 mg by mouth daily. ) 90 tablet 3  . gabapentin (NEURONTIN) 600 MG tablet TAKE  1 TABLET THREE TIMES A DAY 90 tablet 5  . ipratropium (ATROVENT) 0.06 % nasal spray Place 2 sprays into both nostrils daily.  5  . levothyroxine (SYNTHROID, LEVOTHROID) 125 MCG tablet Take 137 mcg by mouth daily before breakfast.     . LORazepam (ATIVAN) 2 MG tablet Take 1 tablet (2 mg total) by mouth at bedtime. For restful sleep (Patient taking differently: Take 1-2 mg by mouth See admin instructions. Take 1 mg by mouth in the morning as needed for anxiety and take 2 mg by mouth at bedtime) 10 tablet 0  . montelukast (SINGULAIR) 10 MG tablet Take 10 mg by mouth daily before breakfast.     . omeprazole (PRILOSEC) 40 MG capsule Take 40 mg by mouth daily.     Marland Kitchen oxyCODONE (OXY IR/ROXICODONE) 5 MG immediate release tablet Take 1-2 tablets (5-10 mg total) by mouth every 4 (four) hours as needed for severe pain. 30 tablet 0  . potassium chloride (KLOR-CON) 8 MEQ tablet Take 8 mEq by mouth daily.    . promethazine (PHENERGAN) 25 MG tablet Take 25 mg by mouth 3 (three) times daily as needed for nausea or vomiting.     Marland Kitchen tiZANidine (ZANAFLEX) 2 MG tablet Take 1 tablet (2 mg total) by mouth 2 (two) times daily as needed for muscle spasms. (Patient taking differently: Take 2 mg by mouth 3 (three) times daily as needed for muscle spasms. ) 10 tablet 0  . traMADol (ULTRAM) 50 MG tablet Take 50-100 mg by mouth 2 (two) times daily as needed for moderate pain.      No current facility-administered medications for this visit.     Allergies:   Biaxin [clarithromycin]; Levaquin [levofloxacin]; Trazodone and nefazodone; Sulfa antibiotics; Aleve [naproxen]; Bextra [valdecoxib]; Doxycycline; Durabac [apap-salicyl-phenyltolox-caff]; Estradiol; Macrobid [nitrofurantoin]; Motrin [ibuprofen]; Oxaprozin; Robaxin [methocarbamol]; and Topamax [topiramate]    Social History:  The patient  reports that she has never smoked. She has never used smokeless tobacco. She reports that she does not drink alcohol or use drugs.    Family History:  The patient's family history includes Arrhythmia in her father and mother; Heart attack in her maternal uncle; Hypertension in her father, maternal grandmother, and mother; Kidney disease in her paternal grandfather; Lung cancer  in her father; Stroke in her maternal grandmother.    ROS:  Please see the history of present illness.   Otherwise, review of systems are positive for foot and leg pain.   All other systems are reviewed and negative.    PHYSICAL EXAM: VS:  BP 106/70   Pulse 87   Ht 5' 7.5" (1.715 m)   Wt 204 lb 12.8 oz (92.9 kg)   SpO2 96%   BMI 31.60 kg/m  , BMI Body mass index is 31.6 kg/m. GEN: Well nourished, well developed, in no acute distress  HEENT: normal  Neck: no JVD, carotid bruits, or masses Cardiac: RRR; no murmurs, rubs, or gallops,no edema  Respiratory:  clear to auscultation bilaterally, normal work of breathing GI: soft, nontender, nondistended, + BS MS: no deformity or atrophy  Skin: warm and dry, no rash Neuro:  Strength and sensation are intact Psych: euthymic mood, full affect   EKG:   The ekg ordered in 9/18 demonstrates NSR   Recent Labs: 04/14/2016: Magnesium 2.0 05/09/2016: NT-Pro BNP 152 05/12/2016: TSH 11.680 11/09/2016: ALT 20 01/30/2017: BUN 16; Creatinine, Ser 1.14; Hemoglobin 13.4; Platelets 245; Potassium 4.2; Sodium 141   Lipid Panel No results found for: CHOL, TRIG, HDL, CHOLHDL, VLDL, LDLCALC, LDLDIRECT   Other studies Reviewed: Additional studies/ records that were reviewed today with results demonstrating: echo in Jan 2018: EF 50-55%.   ASSESSMENT AND PLAN:  1. AFib: Maintaining NSR.  Eliquis for AFib.  S/p Maze. 2. MV repair: No CHF.  DOing well. Needs SBE prophylaxis. 3. Right leg swelling.  Unlikely to be DVT since she is already anticoagulated. 4. Fatigue: I encouraged her to try to be more active.  She will join the [facility].  Perhaps a recumbent bike would be good for an exercise that does not bother her  feet and legs as much.   Current medicines are reviewed at length with the patient today.  The patient concerns regarding her medicines were addressed.  The following changes have been made:  No change  Labs/ tests ordered today include:  No orders of the defined types were placed in this encounter.   Recommend 150 minutes/week of aerobic exercise Low fat, low carb, high fiber diet recommended  Disposition:   FU in 6 months   Signed, Lance MussJayadeep Mearle Drew, MD  02/20/2017 1:27 PM    [facility] [address], [place], [place]  [address] Phone: [phone]) [phone]; Fax: [phone]

## 2017-02-20 ENCOUNTER — Encounter (INDEPENDENT_AMBULATORY_CARE_PROVIDER_SITE_OTHER): Payer: Self-pay

## 2017-02-20 ENCOUNTER — Ambulatory Visit (INDEPENDENT_AMBULATORY_CARE_PROVIDER_SITE_OTHER): Payer: Medicare Other | Admitting: Interventional Cardiology

## 2017-02-20 ENCOUNTER — Encounter: Payer: Self-pay | Admitting: Interventional Cardiology

## 2017-02-20 VITALS — BP 106/70 | HR 87 | Ht 67.5 in | Wt 204.8 lb

## 2017-02-20 DIAGNOSIS — I340 Nonrheumatic mitral (valve) insufficiency: Secondary | ICD-10-CM

## 2017-02-20 DIAGNOSIS — I4891 Unspecified atrial fibrillation: Secondary | ICD-10-CM

## 2017-02-20 DIAGNOSIS — R5382 Chronic fatigue, unspecified: Secondary | ICD-10-CM | POA: Diagnosis not present

## 2017-02-20 DIAGNOSIS — M7989 Other specified soft tissue disorders: Secondary | ICD-10-CM | POA: Diagnosis not present

## 2017-02-20 NOTE — Patient Instructions (Signed)

## 2017-02-20 NOTE — Telephone Encounter (Signed)
The pt returned her BMS pt assistance application to the office today. I have faxed it along with the pts out of pocket expense report from her pharmacy, W2, provider part and Eliquis RX to BMS.

## 2017-02-28 DIAGNOSIS — J3489 Other specified disorders of nose and nasal sinuses: Secondary | ICD-10-CM | POA: Insufficient documentation

## 2017-04-16 ENCOUNTER — Other Ambulatory Visit: Payer: Self-pay

## 2017-04-16 ENCOUNTER — Ambulatory Visit: Payer: Medicare Other | Admitting: Thoracic Surgery (Cardiothoracic Vascular Surgery)

## 2017-04-16 ENCOUNTER — Encounter: Payer: Self-pay | Admitting: Thoracic Surgery (Cardiothoracic Vascular Surgery)

## 2017-04-16 VITALS — BP 129/85 | HR 98 | Ht 67.5 in | Wt 203.0 lb

## 2017-04-16 DIAGNOSIS — I5043 Acute on chronic combined systolic (congestive) and diastolic (congestive) heart failure: Secondary | ICD-10-CM

## 2017-04-16 DIAGNOSIS — Z953 Presence of xenogenic heart valve: Secondary | ICD-10-CM

## 2017-04-16 DIAGNOSIS — I481 Persistent atrial fibrillation: Secondary | ICD-10-CM | POA: Diagnosis not present

## 2017-04-16 DIAGNOSIS — Z79899 Other long term (current) drug therapy: Secondary | ICD-10-CM

## 2017-04-16 DIAGNOSIS — R0602 Shortness of breath: Secondary | ICD-10-CM | POA: Diagnosis not present

## 2017-04-16 DIAGNOSIS — Z9889 Other specified postprocedural states: Secondary | ICD-10-CM | POA: Diagnosis not present

## 2017-04-16 DIAGNOSIS — I428 Other cardiomyopathies: Secondary | ICD-10-CM

## 2017-04-16 DIAGNOSIS — I4819 Other persistent atrial fibrillation: Secondary | ICD-10-CM

## 2017-04-16 DIAGNOSIS — I447 Left bundle-branch block, unspecified: Secondary | ICD-10-CM | POA: Diagnosis not present

## 2017-04-16 DIAGNOSIS — Z8679 Personal history of other diseases of the circulatory system: Secondary | ICD-10-CM | POA: Diagnosis not present

## 2017-04-16 MED ORDER — FUROSEMIDE 40 MG PO TABS: 40.0000 mg | ORAL_TABLET | Freq: Two times a day (BID) | ORAL | 3 refills | Status: DC

## 2017-04-16 NOTE — Patient Instructions (Addendum)
Try increasing your fluid medicine (LASIX) to 1 tablet (40 mg) by mouth twice daily and continue to monitor your breathing, your swelling and your weight   Continue all other previous medications without any changes at this time  Endocarditis is a potentially serious infection of heart valves or inside lining of the heart.  It occurs more commonly in patients with diseased heart valves (such as patient's with aortic or mitral valve disease) and in patients who have undergone heart valve repair or replacement.  Certain surgical and dental procedures may put you at risk, such as dental cleaning, other dental procedures, or any surgery involving the respiratory, urinary, gastrointestinal tract, gallbladder or prostate gland.   To minimize your chances for develooping endocarditis, maintain good oral health and seek prompt medical attention for any infections involving the mouth, teeth, gums, skin or urinary tract.    Always notify your doctor or dentist about your underlying heart valve condition before having any invasive procedures. You will need to take antibiotics before certain procedures, including all routine dental cleanings or other dental procedures.  Your cardiologist or dentist should prescribe these antibiotics for you to be taken ahead of time.

## 2017-04-16 NOTE — Progress Notes (Signed)
[address]       [place] [id number]             [phone]     CARDIOTHORACIC SURGERY OFFICE NOTE  Referring Provider is [name], [name], MD PCP is [name], [name], MD   HPI:  Patient is a 66 year old female with history of hypertension, long-standing persistent atrial fibrillation, nonischemic cardiomyopathy, and chronic combined systolic and diastolic congestive heart failure who returns the office today for routine follow-up approximately 1 year status post aortic valve replacement using a bioprosthetic tissue valve, mitral valve repair, tricuspid valve repair, closure of patent foramen ovale, and Maze procedure on April 13, 2016.  She was last seen here in our office on July 17, 2016 at which time she was doing well and maintaining sinus rhythm.  She has been seen in follow-up in the atrial fibrillation clinic and more recently by [name].  She has continued to maintain sinus rhythm and remains anticoagulated using Eliquis.  She returns to our office for routine follow-up today and reports that overall she is doing quite well.  She states that she feels "much improved" in comparison with how she felt prior to surgery.  She states that recently she has had slight increase in exertional shortness of breath and lower extremity edema.  Her weight has gone up a little bit.  She no longer has any pain in her chest.  Her appetite is good.  She is getting around quite well.  She is not having any palpitations and as far as she knows she is maintaining regular rhythm.  Her biggest problem at this point is that of chronic pain in her right knee related to degenerative arthritis.  She has been told that she needs knee replacement but she is hoping to postpone this as long as possible.   Current Outpatient Medications  Medication Sig Dispense Refill  . apixaban (ELIQUIS) 5 MG TABS tablet Take 1 tablet (5 mg total) by mouth 2 (two) times daily. 180  tablet 3  . aspirin EC 81 MG EC tablet Take 1 tablet (81 mg total) by mouth daily.    Marland Kitchen. atorvastatin (LIPITOR) 20 MG tablet Take 20 mg by mouth daily.    . Calcium Carbonate-Vitamin D (CALTRATE 600+D) 600-400 MG-UNIT per tablet Take 1 tablet by mouth daily.     . carvedilol (COREG) 12.5 MG tablet Take 1 tablet (12.5 mg total) by mouth 2 (two) times daily with a meal. 180 tablet 3  . cetirizine (ZYRTEC) 10 MG tablet Take 10 mg by mouth daily as needed for allergies.    . Cyanocobalamin (VITAMIN B-12) 5000 MCG SUBL Place 1 tablet under the tongue daily. For nerve pain in feet and metabolism support     . diclofenac (VOLTAREN) 75 MG EC tablet Take 75 mg by mouth daily as needed for moderate pain.    . diphenhydrAMINE (BENADRYL) 25 mg capsule Take 25 mg by mouth daily as needed for itching.     . ferrous sulfate 325 (65 FE) MG tablet Take 1 tablet (325 mg total) by mouth 2 (two) times daily with a meal. (Patient taking differently: Take 325 mg by mouth daily. ) 180 tablet 3  . fluticasone (FLONASE) 50 MCG/ACT nasal spray Place 2 sprays into both nostrils daily.    . furosemide (LASIX) 40 MG tablet Take 1 tablet (40 mg total) by mouth daily. (Patient taking differently: Take 20 mg by mouth daily. )  90 tablet 3  . gabapentin (NEURONTIN) 600 MG tablet TAKE 1 TABLET THREE TIMES A DAY 90 tablet 5  . ipratropium (ATROVENT) 0.06 % nasal spray Place 2 sprays into both nostrils daily.  5  . levothyroxine (SYNTHROID, LEVOTHROID) 125 MCG tablet Take 137 mcg by mouth daily before breakfast.     . LORazepam (ATIVAN) 2 MG tablet Take 1 tablet (2 mg total) by mouth at bedtime. For restful sleep (Patient taking differently: Take 1-2 mg by mouth See admin instructions. Take 1 mg by mouth in the morning as needed for anxiety and take 2 mg by mouth at bedtime) 10 tablet 0  . montelukast (SINGULAIR) 10 MG tablet Take 10 mg by mouth daily before breakfast.     . omeprazole (PRILOSEC) 40 MG capsule Take 40 mg by mouth daily.      . potassium chloride (KLOR-CON) 8 MEQ tablet Take 8 mEq by mouth daily.    . promethazine (PHENERGAN) 25 MG tablet Take 25 mg by mouth 3 (three) times daily as needed for nausea or vomiting.     Marland Kitchen tiZANidine (ZANAFLEX) 2 MG tablet Take 1 tablet (2 mg total) by mouth 2 (two) times daily as needed for muscle spasms. (Patient taking differently: Take 2 mg by mouth 3 (three) times daily as needed for muscle spasms. ) 10 tablet 0  . traMADol (ULTRAM) 50 MG tablet Take 50-100 mg by mouth 2 (two) times daily as needed for moderate pain.      No current facility-administered medications for this visit.       Physical Exam:   BP 129/85   Pulse 98   Ht 5' 7.5" (1.715 m)   Wt 203 lb (92.1 kg)   SpO2 98%   BMI 31.33 kg/m   General:  Well-appearing  Chest:   Clear to auscultation  CV:   Regular rate and rhythm without murmur  Incisions:  Completely healed  Abdomen:  Soft nontender  Extremities:  Warm and well perfused with mild lower extremity edema, right greater than left  Diagnostic Tests:  2 channel telemetry rhythm strip demonstrates what appears to be normal sinus rhythm   Impression:  Patient is doing very well and maintaining sinus rhythm approximately 1 year status post aortic valve replacement using a bioprosthetic tissue valve, mitral valve repair, closure of patent foramen ovale and Maze procedure.  She describes mild exertional shortness of breath that only occurs with more strenuous physical exertion, consistent with chronic diastolic congestive heart failure New [place] Heart Association functional class II.  She does have mild lower extremity edema and she reports that her weight has been up a little bit recently.  Plan:  I have instructed the patient to increase her dose of Lasix to 40 mg by mouth twice daily.  She will remain on all other medications without change.  She has been instructed to continue to keep an eye on her weight and watch to see whether or not her lower  extremity edema improves.  The patient has been reminded regarding the importance of dental hygiene and the lifelong need for antibiotic prophylaxis for all dental cleanings and other related invasive procedures.  She will continue to follow up with Dr. Eldridge Dace and return to our office in the future only should specific problems or questions arise.  All of her questions have been addressed.  I spent in excess of 15 minutes during the conduct of this office consultation and >50% of this time involved direct face-to-face encounter with  the patient for counseling and/or coordination of their care.    Salvatore Decent. Cornelius Moras, MD 04/16/2017 12:09 PM

## 2017-04-19 ENCOUNTER — Other Ambulatory Visit: Payer: Self-pay | Admitting: Interventional Cardiology

## 2017-04-29 ENCOUNTER — Other Ambulatory Visit: Payer: Self-pay | Admitting: Physician Assistant

## 2017-05-07 ENCOUNTER — Other Ambulatory Visit (INDEPENDENT_AMBULATORY_CARE_PROVIDER_SITE_OTHER): Payer: Self-pay | Admitting: Family

## 2017-05-15 ENCOUNTER — Other Ambulatory Visit: Payer: Self-pay | Admitting: Internal Medicine

## 2017-05-15 DIAGNOSIS — R110 Nausea: Secondary | ICD-10-CM

## 2017-05-15 DIAGNOSIS — R109 Unspecified abdominal pain: Secondary | ICD-10-CM

## 2017-05-17 ENCOUNTER — Ambulatory Visit
Admission: RE | Admit: 2017-05-17 | Discharge: 2017-05-17 | Disposition: A | Discharge status: Home / Self Care | Payer: Medicare Other | Source: Ambulatory Visit | Attending: Internal Medicine | Admitting: Internal Medicine

## 2017-05-17 DIAGNOSIS — R110 Nausea: Secondary | ICD-10-CM

## 2017-05-17 DIAGNOSIS — R109 Unspecified abdominal pain: Secondary | ICD-10-CM

## 2017-05-28 ENCOUNTER — Other Ambulatory Visit: Payer: Self-pay | Admitting: Interventional Cardiology

## 2017-06-05 ENCOUNTER — Telehealth: Payer: Self-pay

## 2017-06-05 NOTE — Telephone Encounter (Signed)
The pt dropped her BMS pt assistance application off at the front office yesterday 06/04/17. I received when I returned to the office today.  The pt included her 2017 W2s and her 2018 out of pocket expense report from her pharmacy.  I have left a detailed message at the pts messaging service explaining that in order to apply for BMS pt asst in 2019 we need her 2018 W2 and an out of pocket expense report (from 2019) showing that she has spent 3% of her yearly income in this year not 2018.

## 2017-06-13 ENCOUNTER — Telehealth: Payer: Self-pay

## 2017-06-13 ENCOUNTER — Ambulatory Visit: Payer: Self-pay | Admitting: Surgery

## 2017-06-13 NOTE — H&P (Signed)
Surgical H&P  CC: back cyst  HPI: 67 year old woman with multiple comorbidities who presents to discuss multiple skin lesions.  About 3 weeks ago she developed an abscess on her mid back which was drained in urgent care.  She denies knowledge of any cyst in this area prior to this infection that was sent for evaluation of possible residual cyst.  She also complains of multiple skin tags on the line of her bra band would like these removed.  She also notes a small cyst just lateral to the midline in the left breast which has been present for many years but is painful when she would like removed.  Allergies  Allergen Reactions  . Biaxin [Clarithromycin] Nausea Only and Other (See Comments)    Dizzy, blurred vision, achy stomach  . Levaquin [Levofloxacin] Nausea Only and Other (See Comments)    Dizzy, achy stomach, can't sleep  . Trazodone And Nefazodone Other (See Comments)    Weakness in legs Numbness in arms Funny feeling Rapid heart beat Lose focus  . Sulfa Antibiotics Other (See Comments)    Blister on large toe  . Aleve [Naproxen] Nausea And Vomiting and Other (See Comments)    Stomach ache  . Bextra [Valdecoxib] Nausea And Vomiting and Other (See Comments)    Stomach ache   . Doxycycline Nausea Only and Other (See Comments)    Stomach ache  . Durabac [Apap-Salicyl-Phenyltolox-Caff] Nausea And Vomiting  . Estradiol Nausea Only and Other (See Comments)    Hurt stomach  . Macrobid [Nitrofurantoin] Other (See Comments)    BLOATING  . Motrin [Ibuprofen] Nausea Only and Other (See Comments)    Stomach ahce  . Oxaprozin Nausea Only and Other (See Comments)    Hurt stomach  . Robaxin [Methocarbamol] Nausea Only and Other (See Comments)    Hurt stomach  . Topamax [Topiramate] Nausea And Vomiting    Past Medical History:  Diagnosis Date  . AC (acromioclavicular) joint bone spurs    spurs in shoulders with pain  . Anxiety   . Aortic insufficiency   . Arthritis    all over  .  Asthma    from reflux  . Chronic combined systolic and diastolic CHF (congestive heart failure) (HCC)   . Deafness   . Dysrhythmia   . Fibromyalgia   . GERD (gastroesophageal reflux disease)   . Hypertension   . Hypothyroidism   . Mitral regurgitation   . Non-ischemic cardiomyopathy (HCC)    a. Normal cors 01/2016.  . Osteoarthritis of glenohumeral joint, left    Left shoulder  . Persistent atrial fibrillation (HCC)   . PFO (patent foramen ovale)    a. closure 03/2016.  . S/P aortic valve replacement with bioprosthetic valve 04/13/2016   23 mm [facility] Ease bovine pericardial tissue valve  . S/P mitral valve repair 04/13/2016   26 mm Sorin Memo 3D ring annuloplasty  . 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 Tricuspid regurgitation     Past Surgical History:  Procedure Laterality Date  . ABDOMINAL HYSTERECTOMY     partial  . AORTIC VALVE REPLACEMENT N/A 04/13/2016   Procedure: AORTIC VALVE REPLACEMENT (AVR) implanted with Magna Ease size 66mm;  Surgeon: Purcell Nails, MD;  Location: [facility] OR;  Service: Open Heart Surgery;  Laterality: N/A;  . CARDIAC CATHETERIZATION N/A 02/18/2016   Procedure: Right/Left Heart Cath and Coronary Angiography;  Surgeon: Laurey Morale, MD;  Location: [facility] INVASIVE CV LAB;  Service: Cardiovascular;  Laterality: N/A;  . CARDIOVERSION N/A 10/16/2014   Procedure: CARDIOVERSION;  Surgeon: Corky Crafts, MD;  Location: [facility] ENDOSCOPY;  Service: Cardiovascular;  Laterality: N/A;  . CARDIOVERSION N/A 12/04/2014   Procedure: CARDIOVERSION;  Surgeon: Thurmon Fair, MD;  Location: [facility] ENDOSCOPY;  Service: Cardiovascular;  Laterality: N/A;  . COLON SURGERY     spurs  . COLONOSCOPY  [id number]  . COLONOSCOPY WITH PROPOFOL  04/09/2012   Procedure: COLONOSCOPY WITH PROPOFOL;  Surgeon: Charolett Bumpers, MD;  Location: [facility] ENDOSCOPY;  Service: Endoscopy;  Laterality: N/A;  . ESOPHAGOGASTRODUODENOSCOPY ENDOSCOPY      several times  . EYE SURGERY     catarcts  . MAZE N/A 04/13/2016   Procedure: MAZE;  Surgeon: Purcell Nails, MD;  Location[facility] OR;  Service: Open Heart Surgery;  Laterality: N/A;  . MITRAL VALVE REPAIR N/A 04/13/2016   Procedure: MITRAL VALVE REPAIR (MVR) using 26mm Sorin /MEMO 3D annuloplasty ring;  Surgeon: Purcell Nails, MD;  Location: [facility] OR;  Service: Open Heart Surgery;  Laterality: N/A;  . PATENT FORAMEN OVALE CLOSURE N/A 04/13/2016   Procedure: PATENT FORAMEN OVALE (PFO) CLOSURE;  Surgeon: Purcell Nails, MD;  Location: [facility] OR;  Service: Open Heart Surgery;  Laterality: N/A;  . TEE WITHOUT CARDIOVERSION N/A 02/18/2016   Procedure: TRANSESOPHAGEAL ECHOCARDIOGRAM (TEE);  Surgeon: Laurey Morale, MD;  Location: [facility] ENDOSCOPY;  Service: Cardiovascular;  Laterality: N/A;  . TEE WITHOUT CARDIOVERSION N/A 04/13/2016   Procedure: TRANSESOPHAGEAL ECHOCARDIOGRAM (TEE);  Surgeon: Purcell Nails, MD;  Location: [facility] OR;  Service: Open Heart Surgery;  Laterality: N/A;  . THYROID SURGERY  1991  . TONSILLECTOMY    . TOTAL SHOULDER ARTHROPLASTY Right 07/02/2012   Procedure: TOTAL SHOULDER ARTHROPLASTY;  Surgeon: Mable Paris, MD;  Location: [facility] OR;  Service: Orthopedics;  Laterality: Right;  Right total shoulder arthroplasty  . TOTAL SHOULDER ARTHROPLASTY Left 11/16/2016  . TOTAL SHOULDER ARTHROPLASTY Left 11/16/2016   Procedure: TOTAL SHOULDER ARTHROPLASTY;  Surgeon: Jones Broom, MD;  Location: [facility] OR;  Service: Orthopedics;  Laterality: Left;  Left total shoulder arthroplasty  . TRICUSPID VALVE REPLACEMENT N/A 04/13/2016   Procedure: TRICUSPID VALVE REPAIR using a T26 MC3 Edwards annuloplasty ring;  Surgeon: Purcell Nails, MD;  Location: [facility] OR;  Service: Open Heart Surgery;  Laterality: N/A;  . TUBAL LIGATION    . vericose surgery Right leg  05-1998  . WISDOM TOOTH EXTRACTION      Family History  Problem Relation Age of Onset  . Lung cancer Father   . Arrhythmia Father   .  Hypertension Father   . Arrhythmia Mother   . Hypertension Mother   . Stroke Maternal Grandmother   . Hypertension Maternal Grandmother   . Heart attack Maternal Uncle        X2  . Kidney disease Paternal Grandfather     Social History   Socioeconomic History  . Marital status: Married    Spouse name: Not on file  . Number of children: Not on file  . Years of education: Not on file  . Highest education level: Not on file  Social Needs  . Financial resource strain: Not on file  . Food insecurity - worry: Not on file  . Food insecurity - inability: Not on file  . Transportation needs - medical: Not on file  . Transportation needs - non-medical: Not on file  Occupational History  . Not on file  Tobacco Use  . Smoking status:  Never Smoker  . Smokeless tobacco: Never Used  Substance and Sexual Activity  . Alcohol use: No  . Drug use: No  . Sexual activity: Not on file  Other Topics Concern  . Not on file  Social History Narrative  . Not on file    Current Outpatient Medications on File Prior to Visit  Medication Sig Dispense Refill  . apixaban (ELIQUIS) 5 MG TABS tablet Take 1 tablet (5 mg total) by mouth 2 (two) times daily. 180 tablet 3  . aspirin EC 81 MG EC tablet Take 1 tablet (81 mg total) by mouth daily.    Marland Kitchen atorvastatin (LIPITOR) 20 MG tablet Take 20 mg by mouth daily.    . Calcium Carbonate-Vitamin D (CALTRATE 600+D) 600-400 MG-UNIT per tablet Take 1 tablet by mouth daily.     . carvedilol (COREG) 12.5 MG tablet TAKE 1 TABLET BY MOUTH TWO  TIMES DAILY WITH MEALS 180 tablet 1  . cetirizine (ZYRTEC) 10 MG tablet Take 10 mg by mouth daily as needed for allergies.    . Cyanocobalamin (VITAMIN B-12) 5000 MCG SUBL Place 1 tablet under the tongue daily. For nerve pain in feet and metabolism support     . diclofenac (VOLTAREN) 75 MG EC tablet Take 75 mg by mouth daily as needed for moderate pain.    . diphenhydrAMINE (BENADRYL) 25 mg capsule Take 25 mg by mouth daily as  needed for itching.     . ferrous sulfate 325 (65 FE) MG tablet TAKE 1 TABLET (325 MG TOTAL) BY MOUTH 2 (TWO) TIMES DAILY WITH A MEAL. 180 tablet 3  . fluticasone (FLONASE) 50 MCG/ACT nasal spray Place 2 sprays into both nostrils daily.    . furosemide (LASIX) 40 MG tablet Take 1 tablet (40 mg total) by mouth 2 (two) times daily. 90 tablet 3  . gabapentin (NEURONTIN) 600 MG tablet TAKE 1 TABLET BY MOUTH THREE TIMES A DAY 90 tablet 5  . ipratropium (ATROVENT) 0.06 % nasal spray Place 2 sprays into both nostrils daily.  5  . levothyroxine (SYNTHROID, LEVOTHROID) 125 MCG tablet Take 137 mcg by mouth daily before breakfast.     . LORazepam (ATIVAN) 2 MG tablet Take 1 tablet (2 mg total) by mouth at bedtime. For restful sleep (Patient taking differently: Take 1-2 mg by mouth See admin instructions. Take 1 mg by mouth in the morning as needed for anxiety and take 2 mg by mouth at bedtime) 10 tablet 0  . montelukast (SINGULAIR) 10 MG tablet Take 10 mg by mouth daily before breakfast.     . omeprazole (PRILOSEC) 40 MG capsule Take 40 mg by mouth daily.     . potassium chloride (KLOR-CON) 8 MEQ tablet Take 8 mEq by mouth daily.    . promethazine (PHENERGAN) 25 MG tablet Take 25 mg by mouth 3 (three) times daily as needed for nausea or vomiting.     Marland Kitchen tiZANidine (ZANAFLEX) 2 MG tablet Take 1 tablet (2 mg total) by mouth 2 (two) times daily as needed for muscle spasms. (Patient taking differently: Take 2 mg by mouth 3 (three) times daily as needed for muscle spasms. ) 10 tablet 0  . traMADol (ULTRAM) 50 MG tablet Take 50-100 mg by mouth 2 (two) times daily as needed for moderate pain.      No current facility-administered medications on file prior to visit.     Review of Systems: a complete, 10pt review of systems was completed with pertinent positives and negatives as  documented in the HPI  Physical Exam: There were no vitals filed for this visit. Gen: A&Ox3, no distress, sign language interpreter Head:  normocephalic, atraumatic Eyes: extraocular motions intact, anicteric.  Neck: supple without mass or thyromegaly Chest: unlabored respirations, symmetrical air entry, clear bilaterally   Cardiovascular: RRR with palpable distal pulses, no pedal edema Abdomen: soft, nondistended, nontender. No mass or organomegaly.  Extremities: warm, without edema, no deformities  Neuro: grossly intact Psych: appropriate mood and affect, normal insight  Skin: warm and dry. On the mid back, there is a punctate wound draining serosanguineous fluid.  There is no palpable cyst remaining at this time.  Just superior lateral to this there is a poor probably a small sebaceous cyst just next to it.  She also has multiple small skin tags along the broad band line on both sides of her rib cage.  Also complains of a subcentimeter painful cystic nodule just the left of midline in the left breast.   CBC Latest Ref Rng & Units 01/30/2017 11/09/2016 05/22/2016  WBC 3.4 - 10.8 x10E3/uL 6.2 7.1 7.6  Hemoglobin 11.1 - 15.9 g/dL 16.1 09.6 10.9(L)  Hematocrit 34.0 - 46.6 % 41.3 44.6 35.8(L)  Platelets 150 - 379 x10E3/uL 245 227 348    CMP Latest Ref Rng & Units 01/30/2017 11/09/2016 05/22/2016  Glucose 65 - 99 mg/dL 84 80 045(W)  BUN 8 - 27 mg/dL 16 13 8   Creatinine 0.57 - 1.00 mg/dL 0.98(J) 1.91(Y) 7.82(N)  Sodium 134 - 144 mmol/L 141 138 139  Potassium 3.5 - 5.2 mmol/L 4.2 4.0 3.7  Chloride 96 - 106 mmol/L 100 101 103  CO2 20 - 29 mmol/L 25 29 24   Calcium 8.7 - 10.3 mg/dL 9.4 9.1 9.2  Total Protein 6.5 - 8.1 g/dL - 6.6 -  Total Bilirubin 0.3 - 1.2 mg/dL - 0.3 -  Alkaline Phos 38 - 126 U/L - 119 -  AST 15 - 41 U/L - 22 -  ALT 14 - 54 U/L - 20 -    Lab Results  Component Value Date   INR 1.02 11/09/2016   INR 3.5 07/31/2016   INR 1.7 07/24/2016    Imaging: No results found.   A/P: 67 year old woman with multiple skin lesions.  We discussed excision of the small medial left breast subcutaneous cyst, excision of  bilateral chest wall skin tags, and possible excision of mid back subcutaneous cyst if it is still present at the time of the procedure.  Discussed risk of bleeding, infection, wound problems. She expressed understanding and is eager to proceed.  She will need cardiac clearance and hold eliquis preoperatively.   Phylliss Blakes, MD [facility], [place] Pager [phone]

## 2017-06-13 NOTE — Telephone Encounter (Signed)
   [facility] Pre-operative Risk Assessment    Request for surgical clearance:  1. What type of surgery is being performed? Excision of cysts  2. When is this surgery scheduled? Pending clearance  3. Are there any medications that need to be held prior to surgery and how long? Please Advise  4. Practice name and name of physician performing surgery? [facility]  5. What is your office phone and fax number? [place]: [phone] Fax: [phone] (ATTN: Benjiman Core, [place])   6. Anesthesia type (None, local, MAC, general) ? Vivi Barrack J 06/13/2017, 5:06 PM  _________________________________________________________________   (provider comments below)

## 2017-06-14 NOTE — Telephone Encounter (Signed)
OK to hold aspirin 7 days and hold Eliquis as outlined.

## 2017-06-14 NOTE — Telephone Encounter (Signed)
Patient with diagnosis of  Atrial fibrillation on eliquis for anticoagulation.    Procedure: Excision Cysts Date of procedure: TBD  CHADS2-VASc score of  4 (CHF, HTN, AGE, female)  CrCl ~ 90ml/min (IBW = 63kg)   Per office protocol, patient can hold  Eliquis for 2 days prior to procedure.   Patient will not need bridging with Lovenox (enoxaparin) around procedure.  Should restart Eliquis the day after procedure at discretion of procedure MD   Anahli Arvanitis Rodriguez-Guzman PharmD, BCPS, CPP [facility] [address] [place] [address] 06/14/2017 4:50 PM

## 2017-06-14 NOTE — Telephone Encounter (Signed)
   Primary Cardiologist: No primary care provider on file.  Chart reviewed as part of pre-operative protocol coverage. Patient was contacted 06/14/2017 in reference to pre-operative risk assessment for pending surgery as outlined below.  Allison [name] was last seen on 02/20/2017 by Dr. Eldridge Dace.  Since that day, Allison [name] has done well. She is able to achieve at least 6.27 Mets of activity per DASI.   Therefore, based on ACC/AHA guidelines, the patient would be at acceptable risk for the planned procedure without further cardiovascular testing.    Dr. Eldridge Dace, how long patient needs to hold ASA? Will route pharmacy to review Eliquis.   Please forward  response to P CV DIV PREOP. Thank you  Manson Passey, PA 06/14/2017, 2:15 PM

## 2017-06-15 NOTE — Telephone Encounter (Signed)
Faxed to Juanita Craver, CMA's attention via Epic.

## 2017-06-18 ENCOUNTER — Ambulatory Visit: Payer: Self-pay | Admitting: Podiatry

## 2017-06-18 ENCOUNTER — Ambulatory Visit: Payer: Medicare Other | Admitting: Podiatry

## 2017-06-18 DIAGNOSIS — M779 Enthesopathy, unspecified: Secondary | ICD-10-CM

## 2017-06-18 DIAGNOSIS — M5416 Radiculopathy, lumbar region: Secondary | ICD-10-CM | POA: Diagnosis not present

## 2017-06-18 DIAGNOSIS — Q828 Other specified congenital malformations of skin: Secondary | ICD-10-CM

## 2017-06-18 DIAGNOSIS — M792 Neuralgia and neuritis, unspecified: Secondary | ICD-10-CM | POA: Diagnosis not present

## 2017-06-18 DIAGNOSIS — D689 Coagulation defect, unspecified: Secondary | ICD-10-CM

## 2017-06-18 DIAGNOSIS — L989 Disorder of the skin and subcutaneous tissue, unspecified: Secondary | ICD-10-CM | POA: Diagnosis not present

## 2017-06-18 DIAGNOSIS — M25571 Pain in right ankle and joints of right foot: Secondary | ICD-10-CM | POA: Diagnosis not present

## 2017-06-18 DIAGNOSIS — M25572 Pain in left ankle and joints of left foot: Secondary | ICD-10-CM | POA: Diagnosis not present

## 2017-06-18 DIAGNOSIS — I872 Venous insufficiency (chronic) (peripheral): Secondary | ICD-10-CM

## 2017-06-18 NOTE — Progress Notes (Signed)
Subjective:  Patient ID: Allison [name], female    DOB: 12-27-1950,  MRN: [id number]  No chief complaint on file.  67 y.o. female presents with the above complaint.  Reports pain and swelling to the inside of both legs.  States that she has had this issue for many years.  States that the swelling has not present today as it has been previously has been rated 8 out of 10.  Also reports a lesion to the side of the right foot below the fifth toe present for several months.  Believes to be a plantar wart.  Also complains of numbness to both feet.  Denies diabetes.  Has a history of fibromyalgia.  Takes gabapentin but still has pain.  No longer follows up with the provider who told her she has fibromyalgia.  Has issue of upper spinal issues for which she sees Dr. Yevette Edwards  Past Medical History:  Diagnosis Date  . AC (acromioclavicular) joint bone spurs    spurs in shoulders with pain  . Anxiety   . Aortic insufficiency   . Arthritis    all over  . Asthma    from reflux  . Chronic combined systolic and diastolic CHF (congestive heart failure) (HCC)   . Deafness   . Dysrhythmia   . Fibromyalgia   . GERD (gastroesophageal reflux disease)   . Hypertension   . Hypothyroidism   . Mitral regurgitation   . Non-ischemic cardiomyopathy (HCC)    a. Normal cors 01/2016.  . Osteoarthritis of glenohumeral joint, left    Left shoulder  . Persistent atrial fibrillation (HCC)   . PFO (patent foramen ovale)    a. closure 03/2016.  . S/P aortic valve replacement with bioprosthetic valve 04/13/2016   23 mm [facility] Ease bovine pericardial tissue valve  . S/P mitral valve repair 04/13/2016   26 mm Sorin Memo 3D ring annuloplasty  . 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 Tricuspid regurgitation    Past Surgical History:  Procedure Laterality Date  . ABDOMINAL HYSTERECTOMY     partial  . AORTIC VALVE REPLACEMENT N/A 04/13/2016   Procedure: AORTIC  VALVE REPLACEMENT (AVR) implanted with Magna Ease size 23mm;  Surgeon: Purcell Nails, MD;  Location: [facility] OR;  Service: Open Heart Surgery;  Laterality: N/A;  . CARDIAC CATHETERIZATION N/A 02/18/2016   Procedure: Right/Left Heart Cath and Coronary Angiography;  Surgeon: Laurey Morale, MD;  Location: [facility] INVASIVE CV LAB;  Service: Cardiovascular;  Laterality: N/A;  . CARDIOVERSION N/A 10/16/2014   Procedure: CARDIOVERSION;  Surgeon: Corky Crafts, MD;  Location: [facility] ENDOSCOPY;  Service: Cardiovascular;  Laterality: N/A;  . CARDIOVERSION N/A 12/04/2014   Procedure: CARDIOVERSION;  Surgeon: Thurmon Fair, MD;  Location: [facility] ENDOSCOPY;  Service: Cardiovascular;  Laterality: N/A;  . COLON SURGERY     spurs  . COLONOSCOPY  [id number]  . COLONOSCOPY WITH PROPOFOL  04/09/2012   Procedure: COLONOSCOPY WITH PROPOFOL;  Surgeon: Charolett Bumpers, MD;  Location: [facility] ENDOSCOPY;  Service: Endoscopy;  Laterality: N/A;  . ESOPHAGOGASTRODUODENOSCOPY ENDOSCOPY     several times  . EYE SURGERY     catarcts  . MAZE N/A 04/13/2016   Procedure: MAZE;  Surgeon: Purcell Nails, MD;  Location: [facility] OR;  Service: Open Heart Surgery;  Laterality: N/A;  . MITRAL VALVE REPAIR N/A 04/13/2016   Procedure: MITRAL VALVE REPAIR (MVR) using 26mm Sorin /MEMO 3D annuloplasty ring;  Surgeon: Purcell Nails, MD;  Location: [facility] OR;  Service: Open Heart Surgery;  Laterality: N/A;  . PATENT FORAMEN OVALE CLOSURE N/A 04/13/2016   Procedure: PATENT FORAMEN OVALE (PFO) CLOSURE;  Surgeon: Purcell Nails, MD;  Location: [facility] OR;  Service: Open Heart Surgery;  Laterality: N/A;  . TEE WITHOUT CARDIOVERSION N/A 02/18/2016   Procedure: TRANSESOPHAGEAL ECHOCARDIOGRAM (TEE);  Surgeon: Laurey Morale, MD;  Location: [facility] ENDOSCOPY;  Service: Cardiovascular;  Laterality: N/A;  . TEE WITHOUT CARDIOVERSION N/A 04/13/2016   Procedure: TRANSESOPHAGEAL ECHOCARDIOGRAM (TEE);  Surgeon: Purcell Nails, MD;  Location: [facility] OR;  Service: Open Heart Surgery;   Laterality: N/A;  . THYROID SURGERY  1991  . TONSILLECTOMY    . TOTAL SHOULDER ARTHROPLASTY Right 07/02/2012   Procedure: TOTAL SHOULDER ARTHROPLASTY;  Surgeon: Mable Paris, MD;  Location: [facility] OR;  Service: Orthopedics;  Laterality: Right;  Right total shoulder arthroplasty  . TOTAL SHOULDER ARTHROPLASTY Left 11/16/2016  . TOTAL SHOULDER ARTHROPLASTY Left 11/16/2016   Procedure: TOTAL SHOULDER ARTHROPLASTY;  Surgeon: Jones Broom, MD;  Location: [facility] OR;  Service: Orthopedics;  Laterality: Left;  Left total shoulder arthroplasty  . TRICUSPID VALVE REPLACEMENT N/A 04/13/2016   Procedure: TRICUSPID VALVE REPAIR using a T26 MC3 Edwards annuloplasty ring;  Surgeon: Purcell Nails, MD;  Location: [facility] OR;  Service: Open Heart Surgery;  Laterality: N/A;  . TUBAL LIGATION    . vericose surgery Right leg  05-1998  . WISDOM TOOTH EXTRACTION      Current Outpatient Medications:  .  apixaban (ELIQUIS) 5 MG TABS tablet, Take 1 tablet (5 mg total) by mouth 2 (two) times daily., Disp: 180 tablet, Rfl: 3 .  aspirin EC 81 MG EC tablet, Take 1 tablet (81 mg total) by mouth daily., Disp: , Rfl:  .  atorvastatin (LIPITOR) 20 MG tablet, Take 20 mg by mouth daily., Disp: , Rfl:  .  Calcium Carbonate-Vitamin D (CALTRATE 600+D) 600-400 MG-UNIT per tablet, Take 1 tablet by mouth daily. , Disp: , Rfl:  .  carvedilol (COREG) 12.5 MG tablet, TAKE 1 TABLET BY MOUTH TWO  TIMES DAILY WITH MEALS, Disp: 180 tablet, Rfl: 1 .  cetirizine (ZYRTEC) 10 MG tablet, Take 10 mg by mouth daily as needed for allergies., Disp: , Rfl:  .  Cyanocobalamin (VITAMIN B-12) 5000 MCG SUBL, Place 1 tablet under the tongue daily. For nerve pain in feet and metabolism support , Disp: , Rfl:  .  diclofenac (VOLTAREN) 75 MG EC tablet, Take 75 mg by mouth daily as needed for moderate pain., Disp: , Rfl:  .  diphenhydrAMINE (BENADRYL) 25 mg capsule, Take 25 mg by mouth daily as needed for itching. , Disp: , Rfl:  .  ferrous sulfate 325 (65  FE) MG tablet, TAKE 1 TABLET (325 MG TOTAL) BY MOUTH 2 (TWO) TIMES DAILY WITH A MEAL., Disp: 180 tablet, Rfl: 3 .  fluticasone (FLONASE) 50 MCG/ACT nasal spray, Place 2 sprays into both nostrils daily., Disp: , Rfl:  .  furosemide (LASIX) 40 MG tablet, Take 1 tablet (40 mg total) by mouth 2 (two) times daily., Disp: 90 tablet, Rfl: 3 .  gabapentin (NEURONTIN) 600 MG tablet, TAKE 1 TABLET BY MOUTH THREE TIMES A DAY, Disp: 90 tablet, Rfl: 5 .  ipratropium (ATROVENT) 0.06 % nasal spray, Place 2 sprays into both nostrils daily., Disp: , Rfl: 5 .  levothyroxine (SYNTHROID, LEVOTHROID) 125 MCG tablet, Take 137 mcg by mouth daily before breakfast. , Disp: , Rfl:  .  LORazepam (ATIVAN) 2 MG tablet,  Take 1 tablet (2 mg total) by mouth at bedtime. For restful sleep (Patient taking differently: Take 1-2 mg by mouth See admin instructions. Take 1 mg by mouth in the morning as needed for anxiety and take 2 mg by mouth at bedtime), Disp: 10 tablet, Rfl: 0 .  montelukast (SINGULAIR) 10 MG tablet, Take 10 mg by mouth daily before breakfast. , Disp: , Rfl:  .  omeprazole (PRILOSEC) 40 MG capsule, Take 40 mg by mouth daily. , Disp: , Rfl:  .  potassium chloride (KLOR-CON) 8 MEQ tablet, Take 8 mEq by mouth daily., Disp: , Rfl:  .  promethazine (PHENERGAN) 25 MG tablet, Take 25 mg by mouth 3 (three) times daily as needed for nausea or vomiting. , Disp: , Rfl:  .  tiZANidine (ZANAFLEX) 2 MG tablet, Take 1 tablet (2 mg total) by mouth 2 (two) times daily as needed for muscle spasms. (Patient taking differently: Take 2 mg by mouth 3 (three) times daily as needed for muscle spasms. ), Disp: 10 tablet, Rfl: 0 .  traMADol (ULTRAM) 50 MG tablet, Take 50-100 mg by mouth 2 (two) times daily as needed for moderate pain. , Disp: , Rfl:   Allergies  Allergen Reactions  . Biaxin [Clarithromycin] Nausea Only and Other (See Comments)    Dizzy, blurred vision, achy stomach  . Levaquin [Levofloxacin] Nausea Only and Other (See  Comments)    Dizzy, achy stomach, can't sleep  . Trazodone And Nefazodone Other (See Comments)    Weakness in legs Numbness in arms Funny feeling Rapid heart beat Lose focus  . Sulfa Antibiotics Other (See Comments)    Blister on large toe  . Aleve [Naproxen] Nausea And Vomiting and Other (See Comments)    Stomach ache  . Bextra [Valdecoxib] Nausea And Vomiting and Other (See Comments)    Stomach ache   . Doxycycline Nausea Only and Other (See Comments)    Stomach ache  . Durabac [Apap-Salicyl-Phenyltolox-Caff] Nausea And Vomiting  . Estradiol Nausea Only and Other (See Comments)    Hurt stomach  . Macrobid [Nitrofurantoin] Other (See Comments)    BLOATING  . Motrin [Ibuprofen] Nausea Only and Other (See Comments)    Stomach ahce  . Oxaprozin Nausea Only and Other (See Comments)    Hurt stomach  . Robaxin [Methocarbamol] Nausea Only and Other (See Comments)    Hurt stomach  . Topamax [Topiramate] Nausea And Vomiting   Review of Systems Objective:  There were no vitals filed for this visit. General AA&O x3. Normal mood and affect.  Vascular Dorsalis pedis and posterior tibial pulses  present 2+ bilaterally  Capillary refill normal to all digits. Pedal hair growth normal.  Neurologic Epicritic sensation grossly present.  Dermatologic No open lesions. Interspaces clear of maceration. Nails well groomed and normal in appearance.  Orthopedic: MMT 5/5 in dorsiflexion, plantarflexion, inversion, and eversion. Normal joint ROM without pain or crepitus.   Assessment & Plan:  Patient was evaluated and treated and all questions answered.  Right fifth metatarsal capsulitis with punctate keratosis -Lesion debrided.  Patient on Eliquis -Fifth MPJ capsule injected as below for pain and inflammation  Procedure: Paring of Lesion Rationale: painful hyperkeratotic lesion, coagulation defect Type of Debridement: manual, sharp debridement. Instrumentation: 15 blade Number of  Lesions: 1  Procedure: Joint Injection Location: Right 5th MPJ joint Skin Prep: Alcohol. Injectate: 0.5 cc 1% lidocaine plain, 0.5 cc dexamethasone phosphate. Disposition: Patient tolerated procedure well. Injection site dressed with a band-aid.  Venous Insufficiency -Recommend  compression socks  Bilateral Peripheral Neuropathy -Likely due to low back issues -Ready taking gabapentin.  Would consider adjusting dose versus switching to Lyrica.  Patient should follow-up with primary care provider for these options  Return in about 6 weeks (around 07/30/2017) for Capsulitis.

## 2017-06-19 ENCOUNTER — Other Ambulatory Visit: Payer: Self-pay | Admitting: Interventional Cardiology

## 2017-06-20 ENCOUNTER — Other Ambulatory Visit: Payer: Self-pay | Admitting: Interventional Cardiology

## 2017-06-20 ENCOUNTER — Telehealth: Payer: Self-pay | Admitting: Interventional Cardiology

## 2017-06-20 MED ORDER — POTASSIUM CHLORIDE ER 8 MEQ PO TBCR: 8.0000 meq | EXTENDED_RELEASE_TABLET | Freq: Every day | ORAL | 2 refills | Status: DC

## 2017-06-20 NOTE — Telephone Encounter (Signed)
Called pt to inform her that her medication has already been sent to her pharmacy and if she needs any other problems, questions or concerns to call the office. Pt verbalized understanding.

## 2017-06-20 NOTE — Telephone Encounter (Signed)
°*  STAT* If patient is at the pharmacy, call can be transferred to refill team.   1. Which medications need to be refilled? (please list name of each medication and dose if known) potassium chloride (KLOR-CON) 8 MEQ tablet  2. Which pharmacy/location (including street and city if local pharmacy) is medication to be sent to? [id number] - [place], [place] - [address]  3. Do they need a 30 day or 90 day supply? 90

## 2017-06-27 ENCOUNTER — Telehealth: Payer: Self-pay | Admitting: Interventional Cardiology

## 2017-06-27 DIAGNOSIS — I4891 Unspecified atrial fibrillation: Secondary | ICD-10-CM

## 2017-06-27 MED ORDER — POTASSIUM CHLORIDE CRYS ER 20 MEQ PO TBCR: 20.0000 meq | EXTENDED_RELEASE_TABLET | Freq: Two times a day (BID) | ORAL | 0 refills | Status: DC

## 2017-06-27 NOTE — Telephone Encounter (Signed)
New Message   Pt c/o medication issue:  1. Name of Medication: potassium chloride (KLOR-CON) 8 MEQ tablet  2. How are you currently taking this medication (dosage and times per day)? Once a day  3. Are you having a reaction (difficulty breathing--STAT)?   4. What is your medication issue? Patient states that this rx is incorrect. She states in the past she took twice a day. But now the rx is for once a day. Please call to discuss.

## 2017-06-27 NOTE — Telephone Encounter (Signed)
Returned call to patient via deaf interpreter. patient  states that she has been taking k-dur 20 mEq BID for as long as she can remember. She states that she now has a RX for potassium 8 mEq. Made patient aware that I would send in 30 day supply of k[facility]dur [facility]Eq BID [facility] [place]. Will have patient come in to check BMET. New Rx sent to patient's pharmacy. [facility] [place] [facility] and Phil made aware of patient's potassium dose. Lab appointment made for 06/29/17. Per interpreter, patient understands and denies having any questions.

## 2017-06-27 NOTE — Telephone Encounter (Signed)
New message     *STAT* If patient is at the pharmacy, call can be transferred to refill team.   1. Which medications need to be refilled? (please list name of each medication and dose if known) furosemide (LASIX) 40 MG tablet  2. Which pharmacy/location (including street and city if local pharmacy) is medication to be sent to? [id number] - [place], [place] - [address]  3. Do they need a 30 day or 90 day supply? 90

## 2017-06-28 ENCOUNTER — Other Ambulatory Visit: Payer: Medicare Other | Admitting: *Deleted

## 2017-06-28 DIAGNOSIS — I4891 Unspecified atrial fibrillation: Secondary | ICD-10-CM

## 2017-06-28 LAB — BASIC METABOLIC PANEL
BUN / CREAT RATIO: 9 — AB (ref 12–28)
BUN: 11 mg/dL (ref 8–27)
CALCIUM: 9.3 mg/dL (ref 8.7–10.3)
CHLORIDE: 103 mmol/L (ref 96–106)
CO2: 23 mmol/L (ref 20–29)
CREATININE: 1.18 mg/dL — AB (ref 0.57–1.00)
GFR calc Af Amer: 55 mL/min/{1.73_m2} — ABNORMAL LOW (ref >59)
GFR calc non Af Amer: 48 mL/min/{1.73_m2} — ABNORMAL LOW (ref >59)
GLUCOSE: 84 mg/dL (ref 65–99)
Potassium: 4.3 mmol/L (ref 3.5–5.2)
Sodium: 143 mmol/L (ref 134–144)

## 2017-06-29 ENCOUNTER — Other Ambulatory Visit: Payer: Medicare Other

## 2017-07-04 ENCOUNTER — Other Ambulatory Visit: Payer: Self-pay

## 2017-07-04 MED ORDER — POTASSIUM CHLORIDE CRYS ER 20 MEQ PO TBCR: 20.0000 meq | EXTENDED_RELEASE_TABLET | Freq: Two times a day (BID) | ORAL | 3 refills | Status: DC

## 2017-07-17 ENCOUNTER — Telehealth: Payer: Self-pay

## 2017-07-17 NOTE — Telephone Encounter (Signed)
   Primary Cardiologist: Lance Muss, MD  Chart reviewed as part of pre-operative protocol coverage.  Allison [name] was last seen by Dr. Eldridge Dace October 2018.  I attempted to reach her by phone, she is deaf but has video calling and an interpreter.  I was not able to reach her, but had to leave a message.  I requested that she try to contact the preop PA/NP between 130 and 530.  I will route this recommendation to Dr. [place] regarding the aspirin and the Pharmacy Pool regarding the Eliquis.    Notably, Dr. [place] previously said that it was okay to hold the aspirin for 7 days for a different procedure and Pharmacy has said that it is okay to hold the Eliquis for 2 days prior to procedures.  Please call with questions.  Theodore Demark, PA-C 07/17/2017, 6:05 PM

## 2017-07-17 NOTE — Telephone Encounter (Signed)
   [facility] Pre-operative Risk Assessment    Request for surgical clearance:  1. What type of surgery is being performed? Right total knee arthroplasty  2. When is this surgery scheduled? To be determined  3. What type of clearance is required (medical clearance vs. Pharmacy clearance to hold med vs. Both)? Both   4. Are there any medications that need to be held prior to surgery and how long? Eliquis, Aspirin  5. Practice name and name of physician performing surgery? [facility]   6. What is your office phone and fax number? [phone], fax: [phone]  7. Anesthesia type (None, local, MAC, general) ? Not listed    Joaquim Lai 07/17/2017, 11:54 AM  _________________________________________________________________   (provider comments below)

## 2017-07-18 NOTE — Telephone Encounter (Signed)
Pt takes Eliquis for afib with CHADS2VASc score of 3 (age, sex, CHF). HTN noted on problem list but BP has never been elevated - will not count this. CrCl is 7mL/min. Ok to hold Eliquis for 3 days prior to TKA per protocol.

## 2017-07-18 NOTE — Telephone Encounter (Signed)
   Primary Cardiologist: Lance Muss, MD  Chart reviewed as part of pre-operative protocol coverage. Patient was contacted 07/18/2017 in reference to pre-operative risk assessment for pending surgery as outlined below.  Allison [name] was last seen 01/2017 by Dr. Eldridge Dace.  Since that day, Allison [name] has done well w/o any anginal symptoms. No chest pain or dyspnea. No exertional cardiac symptoms.  Therefore, based on ACC/AHA guidelines, the patient would be at acceptable risk for the planned procedure without further cardiovascular testing. Pharmacy has recommended holding Eliquis 3 days prior to procedure.   I will route this recommendation to the requesting party via Epic fax function and remove from pre-op pool.  Please call with questions.  Robbie Lis, PA-C 07/18/2017, 1:46 PM

## 2017-07-25 ENCOUNTER — Other Ambulatory Visit: Payer: Self-pay | Admitting: Interventional Cardiology

## 2017-07-26 ENCOUNTER — Other Ambulatory Visit: Payer: Self-pay | Admitting: Orthopaedic Surgery

## 2017-07-27 ENCOUNTER — Telehealth: Payer: Self-pay | Admitting: Interventional Cardiology

## 2017-07-27 DIAGNOSIS — I447 Left bundle-branch block, unspecified: Secondary | ICD-10-CM

## 2017-07-27 DIAGNOSIS — I4819 Other persistent atrial fibrillation: Secondary | ICD-10-CM

## 2017-07-27 DIAGNOSIS — R0602 Shortness of breath: Secondary | ICD-10-CM

## 2017-07-27 DIAGNOSIS — Z9889 Other specified postprocedural states: Secondary | ICD-10-CM

## 2017-07-27 DIAGNOSIS — I5043 Acute on chronic combined systolic (congestive) and diastolic (congestive) heart failure: Secondary | ICD-10-CM

## 2017-07-27 DIAGNOSIS — Z79899 Other long term (current) drug therapy: Secondary | ICD-10-CM

## 2017-07-27 DIAGNOSIS — I428 Other cardiomyopathies: Secondary | ICD-10-CM

## 2017-07-27 MED ORDER — FUROSEMIDE 40 MG PO TABS: 40.0000 mg | ORAL_TABLET | Freq: Two times a day (BID) | ORAL | 3 refills | Status: DC

## 2017-07-27 NOTE — Telephone Encounter (Signed)
New message     *STAT* If patient is at the pharmacy, call can be transferred to refill team.   1. Which medications need to be refilled? (please list name of each medication and dose if known) furosemide (LASIX) 40 MG tablet  2. Which pharmacy/location (including street and city if local pharmacy) is medication to be sent to?[facility] - [place], [place] - [address]  3. Do they need a 30 day or 90 day supply? 180

## 2017-07-30 ENCOUNTER — Ambulatory Visit: Payer: Medicare Other | Admitting: Podiatry

## 2017-07-31 ENCOUNTER — Encounter (HOSPITAL_BASED_OUTPATIENT_CLINIC_OR_DEPARTMENT_OTHER): Payer: Self-pay | Admitting: *Deleted

## 2017-08-01 ENCOUNTER — Encounter (HOSPITAL_BASED_OUTPATIENT_CLINIC_OR_DEPARTMENT_OTHER)
Admission: RE | Admit: 2017-08-01 | Discharge: 2017-08-01 | Disposition: A | Discharge status: Home / Self Care | Payer: Medicare Other | Source: Ambulatory Visit | Attending: Surgery | Admitting: Surgery

## 2017-08-01 DIAGNOSIS — M797 Fibromyalgia: Secondary | ICD-10-CM | POA: Diagnosis not present

## 2017-08-01 DIAGNOSIS — Z96612 Presence of left artificial shoulder joint: Secondary | ICD-10-CM | POA: Diagnosis not present

## 2017-08-01 DIAGNOSIS — F419 Anxiety disorder, unspecified: Secondary | ICD-10-CM | POA: Diagnosis not present

## 2017-08-01 DIAGNOSIS — Z881 Allergy status to other antibiotic agents status: Secondary | ICD-10-CM | POA: Diagnosis not present

## 2017-08-01 DIAGNOSIS — Z888 Allergy status to other drugs, medicaments and biological substances status: Secondary | ICD-10-CM | POA: Diagnosis not present

## 2017-08-01 DIAGNOSIS — Z8249 Family history of ischemic heart disease and other diseases of the circulatory system: Secondary | ICD-10-CM | POA: Diagnosis not present

## 2017-08-01 DIAGNOSIS — I110 Hypertensive heart disease with heart failure: Secondary | ICD-10-CM | POA: Diagnosis not present

## 2017-08-01 DIAGNOSIS — Z886 Allergy status to analgesic agent status: Secondary | ICD-10-CM | POA: Diagnosis not present

## 2017-08-01 DIAGNOSIS — Z79899 Other long term (current) drug therapy: Secondary | ICD-10-CM | POA: Diagnosis not present

## 2017-08-01 DIAGNOSIS — Z7982 Long term (current) use of aspirin: Secondary | ICD-10-CM | POA: Diagnosis not present

## 2017-08-01 DIAGNOSIS — I340 Nonrheumatic mitral (valve) insufficiency: Secondary | ICD-10-CM | POA: Diagnosis not present

## 2017-08-01 DIAGNOSIS — Z882 Allergy status to sulfonamides status: Secondary | ICD-10-CM | POA: Diagnosis not present

## 2017-08-01 DIAGNOSIS — I429 Cardiomyopathy, unspecified: Secondary | ICD-10-CM | POA: Diagnosis not present

## 2017-08-01 DIAGNOSIS — I5042 Chronic combined systolic (congestive) and diastolic (congestive) heart failure: Secondary | ICD-10-CM | POA: Diagnosis not present

## 2017-08-01 DIAGNOSIS — Z952 Presence of prosthetic heart valve: Secondary | ICD-10-CM | POA: Diagnosis not present

## 2017-08-01 DIAGNOSIS — L918 Other hypertrophic disorders of the skin: Secondary | ICD-10-CM | POA: Diagnosis not present

## 2017-08-01 DIAGNOSIS — E039 Hypothyroidism, unspecified: Secondary | ICD-10-CM | POA: Diagnosis not present

## 2017-08-01 DIAGNOSIS — K219 Gastro-esophageal reflux disease without esophagitis: Secondary | ICD-10-CM | POA: Diagnosis not present

## 2017-08-01 DIAGNOSIS — I071 Rheumatic tricuspid insufficiency: Secondary | ICD-10-CM | POA: Diagnosis not present

## 2017-08-01 DIAGNOSIS — I481 Persistent atrial fibrillation: Secondary | ICD-10-CM | POA: Diagnosis not present

## 2017-08-01 DIAGNOSIS — L720 Epidermal cyst: Secondary | ICD-10-CM | POA: Diagnosis not present

## 2017-08-01 DIAGNOSIS — L728 Other follicular cysts of the skin and subcutaneous tissue: Secondary | ICD-10-CM | POA: Diagnosis present

## 2017-08-01 DIAGNOSIS — Z7901 Long term (current) use of anticoagulants: Secondary | ICD-10-CM | POA: Diagnosis not present

## 2017-08-01 LAB — CBC WITH DIFFERENTIAL/PLATELET
Basophils Absolute: 0 10*3/uL (ref 0.0–0.1)
Basophils Relative: 0 %
Eosinophils Absolute: 0.4 10*3/uL (ref 0.0–0.7)
Eosinophils Relative: 6 %
HEMATOCRIT: 40.5 % (ref 36.0–46.0)
HEMOGLOBIN: 12.5 g/dL (ref 12.0–15.0)
LYMPHS ABS: 1.8 10*3/uL (ref 0.7–4.0)
Lymphocytes Relative: 29 %
MCH: 28.5 pg (ref 26.0–34.0)
MCHC: 30.9 g/dL (ref 30.0–36.0)
MCV: 92.3 fL (ref 78.0–100.0)
MONO ABS: 0.5 10*3/uL (ref 0.1–1.0)
MONOS PCT: 8 %
NEUTROS ABS: 3.4 10*3/uL (ref 1.7–7.7)
Neutrophils Relative %: 57 %
Platelets: 258 10*3/uL (ref 150–400)
RBC: 4.39 MIL/uL (ref 3.87–5.11)
RDW: 13.5 % (ref 11.5–15.5)
WBC: 6.1 10*3/uL (ref 4.0–10.5)

## 2017-08-01 LAB — BASIC METABOLIC PANEL
ANION GAP: 10 (ref 5–15)
BUN: 15 mg/dL (ref 6–20)
CHLORIDE: 103 mmol/L (ref 101–111)
CO2: 27 mmol/L (ref 22–32)
CREATININE: 1.25 mg/dL — AB (ref 0.44–1.00)
Calcium: 9.1 mg/dL (ref 8.9–10.3)
GFR calc non Af Amer: 43 mL/min — ABNORMAL LOW (ref >60)
GFR, EST AFRICAN AMERICAN: 50 mL/min — AB (ref >60)
GLUCOSE: 86 mg/dL (ref 65–99)
Potassium: 4.5 mmol/L (ref 3.5–5.1)
Sodium: 140 mmol/L (ref 135–145)

## 2017-08-03 ENCOUNTER — Ambulatory Visit (HOSPITAL_BASED_OUTPATIENT_CLINIC_OR_DEPARTMENT_OTHER)
Admission: RE | Admit: 2017-08-03 | Discharge: 2017-08-03 | Disposition: A | Discharge status: Home / Self Care | Payer: Medicare Other | Source: Ambulatory Visit | Attending: Surgery | Admitting: Surgery

## 2017-08-03 ENCOUNTER — Encounter (HOSPITAL_BASED_OUTPATIENT_CLINIC_OR_DEPARTMENT_OTHER): Payer: Self-pay | Admitting: Anesthesiology

## 2017-08-03 ENCOUNTER — Encounter (HOSPITAL_BASED_OUTPATIENT_CLINIC_OR_DEPARTMENT_OTHER)
Admission: RE | Disposition: A | Discharge status: Home / Self Care | Payer: Self-pay | Source: Ambulatory Visit | Attending: Surgery

## 2017-08-03 ENCOUNTER — Ambulatory Visit (HOSPITAL_BASED_OUTPATIENT_CLINIC_OR_DEPARTMENT_OTHER): Payer: Medicare Other | Admitting: Anesthesiology

## 2017-08-03 ENCOUNTER — Other Ambulatory Visit: Payer: Self-pay

## 2017-08-03 DIAGNOSIS — Z882 Allergy status to sulfonamides status: Secondary | ICD-10-CM | POA: Insufficient documentation

## 2017-08-03 DIAGNOSIS — Z886 Allergy status to analgesic agent status: Secondary | ICD-10-CM | POA: Insufficient documentation

## 2017-08-03 DIAGNOSIS — Z79899 Other long term (current) drug therapy: Secondary | ICD-10-CM | POA: Insufficient documentation

## 2017-08-03 DIAGNOSIS — I5042 Chronic combined systolic (congestive) and diastolic (congestive) heart failure: Secondary | ICD-10-CM | POA: Insufficient documentation

## 2017-08-03 DIAGNOSIS — Z881 Allergy status to other antibiotic agents status: Secondary | ICD-10-CM | POA: Insufficient documentation

## 2017-08-03 DIAGNOSIS — K219 Gastro-esophageal reflux disease without esophagitis: Secondary | ICD-10-CM | POA: Insufficient documentation

## 2017-08-03 DIAGNOSIS — I110 Hypertensive heart disease with heart failure: Secondary | ICD-10-CM | POA: Insufficient documentation

## 2017-08-03 DIAGNOSIS — Z8249 Family history of ischemic heart disease and other diseases of the circulatory system: Secondary | ICD-10-CM | POA: Insufficient documentation

## 2017-08-03 DIAGNOSIS — Z7901 Long term (current) use of anticoagulants: Secondary | ICD-10-CM | POA: Insufficient documentation

## 2017-08-03 DIAGNOSIS — I071 Rheumatic tricuspid insufficiency: Secondary | ICD-10-CM | POA: Insufficient documentation

## 2017-08-03 DIAGNOSIS — L720 Epidermal cyst: Secondary | ICD-10-CM | POA: Diagnosis not present

## 2017-08-03 DIAGNOSIS — F419 Anxiety disorder, unspecified: Secondary | ICD-10-CM | POA: Insufficient documentation

## 2017-08-03 DIAGNOSIS — E039 Hypothyroidism, unspecified: Secondary | ICD-10-CM | POA: Insufficient documentation

## 2017-08-03 DIAGNOSIS — I481 Persistent atrial fibrillation: Secondary | ICD-10-CM | POA: Insufficient documentation

## 2017-08-03 DIAGNOSIS — Z7982 Long term (current) use of aspirin: Secondary | ICD-10-CM | POA: Insufficient documentation

## 2017-08-03 DIAGNOSIS — Z888 Allergy status to other drugs, medicaments and biological substances status: Secondary | ICD-10-CM | POA: Insufficient documentation

## 2017-08-03 DIAGNOSIS — L918 Other hypertrophic disorders of the skin: Secondary | ICD-10-CM | POA: Diagnosis not present

## 2017-08-03 DIAGNOSIS — M797 Fibromyalgia: Secondary | ICD-10-CM | POA: Insufficient documentation

## 2017-08-03 DIAGNOSIS — I429 Cardiomyopathy, unspecified: Secondary | ICD-10-CM | POA: Insufficient documentation

## 2017-08-03 DIAGNOSIS — Z96612 Presence of left artificial shoulder joint: Secondary | ICD-10-CM | POA: Insufficient documentation

## 2017-08-03 DIAGNOSIS — I340 Nonrheumatic mitral (valve) insufficiency: Secondary | ICD-10-CM | POA: Insufficient documentation

## 2017-08-03 DIAGNOSIS — Z952 Presence of prosthetic heart valve: Secondary | ICD-10-CM | POA: Insufficient documentation

## 2017-08-03 HISTORY — PX: BREAST CYST EXCISION: SHX579

## 2017-08-03 HISTORY — PX: EXCISION OF SKIN TAG: SHX6270

## 2017-08-03 SURGERY — EXCISION, CYST, BREAST
Anesthesia: Monitor Anesthesia Care | Site: Back

## 2017-08-03 MED ORDER — ONDANSETRON HCL 4 MG/2ML IJ SOLN
INTRAMUSCULAR | Status: AC
  Filled 2017-08-03: qty 2

## 2017-08-03 MED ORDER — FENTANYL CITRATE (PF) 100 MCG/2ML IJ SOLN
INTRAMUSCULAR | Status: AC
  Filled 2017-08-03: qty 2

## 2017-08-03 MED ORDER — OXYCODONE HCL 5 MG/5ML PO SOLN: 5.0000 mg | Freq: Once | ORAL | Status: DC | PRN

## 2017-08-03 MED ORDER — BUPIVACAINE HCL (PF) 0.25 % IJ SOLN
INTRAMUSCULAR | Status: DC | PRN
  Administered 2017-08-03: 13 mL

## 2017-08-03 MED ORDER — FENTANYL CITRATE (PF) 100 MCG/2ML IJ SOLN
50.0000 ug | INTRAMUSCULAR | Status: DC | PRN
  Administered 2017-08-03: 50 ug via INTRAVENOUS

## 2017-08-03 MED ORDER — PROMETHAZINE HCL 25 MG/ML IJ SOLN: 6.2500 mg | INTRAMUSCULAR | Status: DC | PRN

## 2017-08-03 MED ORDER — CEFAZOLIN SODIUM-DEXTROSE 2-4 GM/100ML-% IV SOLN
INTRAVENOUS | Status: AC
  Filled 2017-08-03: qty 100

## 2017-08-03 MED ORDER — LIDOCAINE HCL (CARDIAC) 20 MG/ML IV SOLN
INTRAVENOUS | Status: AC
Start: 2017-08-03 — End: ?
  Filled 2017-08-03: qty 5

## 2017-08-03 MED ORDER — ONDANSETRON HCL 4 MG/2ML IJ SOLN
INTRAMUSCULAR | Status: DC | PRN
  Administered 2017-08-03: 4 mg via INTRAVENOUS

## 2017-08-03 MED ORDER — SCOPOLAMINE 1 MG/3DAYS TD PT72
1.0000 | MEDICATED_PATCH | Freq: Once | TRANSDERMAL | Status: DC | PRN
Start: 2017-08-03 — End: 2017-08-03

## 2017-08-03 MED ORDER — CHLORHEXIDINE GLUCONATE 4 % EX LIQD: 60.0000 mL | Freq: Once | CUTANEOUS | Status: DC

## 2017-08-03 MED ORDER — PROPOFOL 10 MG/ML IV BOLUS
INTRAVENOUS | Status: DC | PRN
  Administered 2017-08-03: 30 mg via INTRAVENOUS

## 2017-08-03 MED ORDER — OXYCODONE HCL 5 MG PO TABS: 5.0000 mg | ORAL_TABLET | Freq: Once | ORAL | Status: DC | PRN

## 2017-08-03 MED ORDER — FENTANYL CITRATE (PF) 100 MCG/2ML IJ SOLN
25.0000 ug | INTRAMUSCULAR | Status: DC | PRN
  Administered 2017-08-03 (×2): 25 ug via INTRAVENOUS

## 2017-08-03 MED ORDER — CEFAZOLIN SODIUM-DEXTROSE 2-4 GM/100ML-% IV SOLN
2.0000 g | INTRAVENOUS | Status: AC
  Administered 2017-08-03: 2 g via INTRAVENOUS

## 2017-08-03 MED ORDER — SUCCINYLCHOLINE CHLORIDE 200 MG/10ML IV SOSY
PREFILLED_SYRINGE | INTRAVENOUS | Status: AC
  Filled 2017-08-03: qty 10

## 2017-08-03 MED ORDER — DOCUSATE SODIUM 100 MG PO CAPS: 100.0000 mg | ORAL_CAPSULE | Freq: Two times a day (BID) | ORAL | 0 refills | Status: AC

## 2017-08-03 MED ORDER — HYDROCODONE-ACETAMINOPHEN 5-325 MG PO TABS: 1.0000 | ORAL_TABLET | Freq: Four times a day (QID) | ORAL | 0 refills | Status: DC | PRN

## 2017-08-03 MED ORDER — MIDAZOLAM HCL 2 MG/2ML IJ SOLN
INTRAMUSCULAR | Status: AC
Start: 2017-08-03 — End: ?
  Filled 2017-08-03: qty 2

## 2017-08-03 MED ORDER — EPHEDRINE 5 MG/ML INJ
INTRAVENOUS | Status: AC
Start: 2017-08-03 — End: ?
  Filled 2017-08-03: qty 10

## 2017-08-03 MED ORDER — PROPOFOL 500 MG/50ML IV EMUL
INTRAVENOUS | Status: DC | PRN
  Administered 2017-08-03: 100 ug/kg/min via INTRAVENOUS

## 2017-08-03 MED ORDER — MIDAZOLAM HCL 2 MG/2ML IJ SOLN
1.0000 mg | INTRAMUSCULAR | Status: DC | PRN
  Administered 2017-08-03: 1 mg via INTRAVENOUS

## 2017-08-03 MED ORDER — PHENYLEPHRINE 40 MCG/ML (10ML) SYRINGE FOR IV PUSH (FOR BLOOD PRESSURE SUPPORT)
PREFILLED_SYRINGE | INTRAVENOUS | Status: AC
Start: 2017-08-03 — End: ?
  Filled 2017-08-03: qty 10

## 2017-08-03 MED ORDER — LACTATED RINGERS IV SOLN
INTRAVENOUS | Status: DC
  Administered 2017-08-03: 09:00:00 via INTRAVENOUS

## 2017-08-03 SURGICAL SUPPLY — 61 items
ADH SKN CLS APL DERMABOND .7 (GAUZE/BANDAGES/DRESSINGS) ×2
BINDER BREAST LRG (GAUZE/BANDAGES/DRESSINGS) IMPLANT
BINDER BREAST MEDIUM (GAUZE/BANDAGES/DRESSINGS) IMPLANT
BINDER BREAST XLRG (GAUZE/BANDAGES/DRESSINGS) IMPLANT
BINDER BREAST XXLRG (GAUZE/BANDAGES/DRESSINGS) IMPLANT
BLADE CLIPPER SURG (BLADE) IMPLANT
BLADE SURG 15 STRL LF DISP TIS (BLADE) ×2 IMPLANT
BLADE SURG 15 STRL SS (BLADE) ×4
CANISTER SUCT 1200ML W/VALVE (MISCELLANEOUS) IMPLANT
CHLORAPREP W/TINT 26ML (MISCELLANEOUS) ×6 IMPLANT
CLOSURE STERI-STRIP 1/2X4 (GAUZE/BANDAGES/DRESSINGS) ×1
CLSR STERI-STRIP ANTIMIC 1/2X4 (GAUZE/BANDAGES/DRESSINGS) ×3 IMPLANT
COVER BACK TABLE 60X90IN (DRAPES) ×4 IMPLANT
COVER MAYO STAND STRL (DRAPES) ×4 IMPLANT
DECANTER SPIKE VIAL GLASS SM (MISCELLANEOUS) IMPLANT
DERMABOND ADVANCED (GAUZE/BANDAGES/DRESSINGS) ×2
DERMABOND ADVANCED .7 DNX12 (GAUZE/BANDAGES/DRESSINGS) ×2 IMPLANT
DRAPE LAPAROTOMY 100X72 PEDS (DRAPES) ×4 IMPLANT
DRAPE LAPAROTOMY T 98X78 PEDS (DRAPES) ×4 IMPLANT
DRAPE UTILITY XL STRL (DRAPES) ×4 IMPLANT
DRSG TEGADERM 4X4.75 (GAUZE/BANDAGES/DRESSINGS) IMPLANT
ELECT COATED BLADE 2.86 ST (ELECTRODE) ×4 IMPLANT
ELECT REM PT RETURN 9FT ADLT (ELECTROSURGICAL) ×4
ELECTRODE REM PT RTRN 9FT ADLT (ELECTROSURGICAL) ×2 IMPLANT
GAUZE PACKING IODOFORM 1/4X15 (GAUZE/BANDAGES/DRESSINGS) IMPLANT
GAUZE SPONGE 4X4 12PLY STRL LF (GAUZE/BANDAGES/DRESSINGS) IMPLANT
GLOVE BIO SURGEON STRL SZ 6 (GLOVE) ×4 IMPLANT
GLOVE BIO SURGEON STRL SZ 6.5 (GLOVE) ×1 IMPLANT
GLOVE BIO SURGEONS STRL SZ 6.5 (GLOVE) ×1
GLOVE BIOGEL PI IND STRL 6.5 (GLOVE) ×2 IMPLANT
GLOVE BIOGEL PI IND STRL 8 (GLOVE) IMPLANT
GLOVE BIOGEL PI INDICATOR 6.5 (GLOVE) ×2
GLOVE BIOGEL PI INDICATOR 8 (GLOVE) ×2
GOWN STRL REUS W/ TWL LRG LVL3 (GOWN DISPOSABLE) ×4 IMPLANT
GOWN STRL REUS W/TWL LRG LVL3 (GOWN DISPOSABLE) ×8
NDL HYPO 25X1 1.5 SAFETY (NEEDLE) ×2 IMPLANT
NEEDLE HYPO 25X1 1.5 SAFETY (NEEDLE) ×4 IMPLANT
NS IRRIG 1000ML POUR BTL (IV SOLUTION) IMPLANT
PACK BASIN DAY SURGERY FS (CUSTOM PROCEDURE TRAY) ×4 IMPLANT
PENCIL BUTTON HOLSTER BLD 10FT (ELECTRODE) ×4 IMPLANT
SLEEVE SCD COMPRESS KNEE MED (MISCELLANEOUS) ×4 IMPLANT
SPONGE LAP 4X18 X RAY DECT (DISPOSABLE) ×4 IMPLANT
SUT ETHILON 2 0 FS 18 (SUTURE) IMPLANT
SUT MNCRL AB 4-0 PS2 18 (SUTURE) ×4 IMPLANT
SUT SILK 2 0 SH (SUTURE) IMPLANT
SUT VIC AB 2-0 SH 27 (SUTURE)
SUT VIC AB 2-0 SH 27XBRD (SUTURE) IMPLANT
SUT VIC AB 3-0 SH 27 (SUTURE)
SUT VIC AB 3-0 SH 27X BRD (SUTURE) IMPLANT
SUT VICRYL 3-0 CR8 SH (SUTURE) IMPLANT
SUT VICRYL 4-0 PS2 18IN ABS (SUTURE) IMPLANT
SWAB COLLECTION DEVICE MRSA (MISCELLANEOUS) IMPLANT
SWAB CULTURE ESWAB REG 1ML (MISCELLANEOUS) IMPLANT
SYR BULB 3OZ (MISCELLANEOUS) ×2 IMPLANT
SYR CONTROL 10ML LL (SYRINGE) ×4 IMPLANT
TOWEL OR 17X24 6PK STRL BLUE (TOWEL DISPOSABLE) ×4 IMPLANT
TOWEL OR NON WOVEN STRL DISP B (DISPOSABLE) ×4 IMPLANT
TUBE CONNECTING 20'X1/4 (TUBING)
TUBE CONNECTING 20X1/4 (TUBING) IMPLANT
UNDERPAD 30X30 (UNDERPADS AND DIAPERS) ×2 IMPLANT
YANKAUER SUCT BULB TIP NO VENT (SUCTIONS) IMPLANT

## 2017-08-03 NOTE — Op Note (Signed)
Operative Note  ALVA LUY  [id number]  [id number]  08/03/2017   Surgeon: Lady Deutscher ConnorMD  Assistant: OR staff  Procedure performed: 1.  Excision of subcutaneous cyst from right mid back, 2 x 1cm  2.  Excision of subcutaneous cyst from left medial breast, 1 x 1 cm 3.  Excision of multiple skin tags from bilateral chest wall, total lesions excised= 9  Preop diagnosis: Multiple skin lesions Post-op diagnosis/intraop findings: Subcutaneous cyst to mid back consistent with sebaceous cyst, subcutaneous cyst to left medial breast consistent with suture granuloma, multiple skin tags  Specimens: Right mid back cyst, left medial breast cyst Retained items: No  EBL: Minimal cc Complications: none  Description of procedure: After obtaining informed consent the patient was taken to the operating room and placed in the left lateral decubitus position on the operating room table where monitored anesthesia care was initiated preop antibiotics administered and a formal timeout was performed.  The skin of the mid back was prepped and draped in usual sterile fashion.  After infiltration with local, an oblique elliptical incision was made around the small subcutaneous cyst in the right mid back.  Sharp dissection and cautery were then used to excise the small subcutaneous cyst, final wound measured 2 cm x 1 cm.  Hemostasis was ensured within the wound and it was then closed with interrupted vertical mattress sutures of 3-0 nylon.  A dry dressing was applied.  The patient was then placed supine and the chest wall and bilateral axilla were prepped and draped in usual sterile fashion.  The skin surrounding the cyst at the medial left breast was infiltrated with local and then a transverse elliptical incision was made and sharp dissection as well as cautery were used to excise the small subcutaneous cyst, final wound 1 cm x 1 cm.  Hemostasis was ensured within the wound which was then closed with  interrupted vertical mattress sutures of 3-0 nylon.  Then turned to the multiple skin cyst.  The patient has innumerable polypoid as well as flat skin tags along the area where her bra irritates her skin.  Of the ones that could be easily excised, there was one in the left axilla, and then 8 more along the area where her bra band rubs.  In total 9 small polypoid skin tags were excised after infiltration with local using cautery.  These were not sent for pathology.  Hemostasis was ensured.  Each of these was covered with Steri-Strips.  A dry dressing was applied to the breast wound.  The patient was then awakened and transported to the recovery area in stable condition.  All counts were correct at the completion of the case.

## 2017-08-03 NOTE — Anesthesia Postprocedure Evaluation (Signed)
Anesthesia Post Note  Patient: Allison [name]  Procedure(s) Performed: EXCISION OF MEDIAL LEFT BREAST SUBCUTANEOUS CYST AND EXCISION OF SUBCUTANEOUS CYST OF MID BACK (N/A Back) EXCISION OF BILATERAL CHEST WALL SKINS TAGS (N/A )     Patient location during evaluation: PACU Anesthesia Type: MAC Level of consciousness: awake and alert Pain management: pain level controlled Vital Signs Assessment: post-procedure vital signs reviewed and stable Respiratory status: spontaneous breathing, nonlabored ventilation, respiratory function stable and patient connected to nasal cannula oxygen Cardiovascular status: stable and blood pressure returned to baseline Postop Assessment: no apparent nausea or vomiting Anesthetic complications: no    Last Vitals:  Vitals:   08/03/17 1036 08/03/17 1045  BP: 107/67 120/70  Pulse: 78 74  Resp: 12 12  Temp: 36.4 C   SpO2: 94% 98%    Last Pain:  Vitals:   08/03/17 1045  TempSrc:   PainSc: 5                  [name] S

## 2017-08-03 NOTE — Transfer of Care (Signed)
Immediate Anesthesia Transfer of Care Note  Patient: Allison [name]  Procedure(s) Performed: EXCISION OF MEDIAL LEFT BREAST SUBCUTANEOUS CYST AND EXCISION OF SUBCUTANEOUS CYST OF MID BACK (N/A Back) EXCISION OF BILATERAL CHEST WALL SKINS TAGS (N/A )  Patient Location: PACU  Anesthesia Type:MAC  Level of Consciousness: awake, alert  and oriented  Airway & Oxygen Therapy: Patient Spontanous Breathing and Patient connected to face mask oxygen  Post-op Assessment: Report given to RN and Post -op Vital signs reviewed and stable  Post vital signs: Reviewed and stable  Last Vitals:  Vitals Value Taken Time  BP    Temp    Pulse 78 08/03/2017 10:36 AM  Resp 12 08/03/2017 10:36 AM  SpO2 94 % 08/03/2017 10:36 AM  Vitals shown include unvalidated device data.  Last Pain:  Vitals:   08/03/17 0856  TempSrc: Oral  PainSc: 0-No pain      Patients Stated Pain Goal: 3 (00/92/33 0076)  Complications: No apparent anesthesia complications

## 2017-08-03 NOTE — Anesthesia Procedure Notes (Signed)
Date/Time: 08/03/2017 10:00 AM Performed by: Ronnette Hila, CRNA Ventilation: Oral airway inserted - appropriate to patient size

## 2017-08-03 NOTE — H&P (Signed)
Surgical H&P  CC: back cyst  HPI: 67 year old woman with multiple comorbidities who presents to discuss multiple skin lesions.  About 3 weeks ago she developed an abscess on her mid back which was drained in urgent care.  She denies knowledge of any cyst in this area prior to this infection that was sent for evaluation of possible residual cyst.  She also complains of multiple skin tags on the line of her bra band would like these removed.  She also notes a small cyst just lateral to the midline in the left breast which has been present for many years but is painful when she would like removed.       Allergies  Allergen Reactions  . Biaxin [Clarithromycin] Nausea Only and Other (See Comments)    Dizzy, blurred vision, achy stomach  . Levaquin [Levofloxacin] Nausea Only and Other (See Comments)    Dizzy, achy stomach, can't sleep  . Trazodone And Nefazodone Other (See Comments)    Weakness in legs Numbness in arms Funny feeling Rapid heart beat Lose focus  . Sulfa Antibiotics Other (See Comments)    Blister on large toe  . Aleve [Naproxen] Nausea And Vomiting and Other (See Comments)    Stomach ache  . Bextra [Valdecoxib] Nausea And Vomiting and Other (See Comments)    Stomach ache   . Doxycycline Nausea Only and Other (See Comments)    Stomach ache  . Durabac [Apap-Salicyl-Phenyltolox-Caff] Nausea And Vomiting  . Estradiol Nausea Only and Other (See Comments)    Hurt stomach  . Macrobid [Nitrofurantoin] Other (See Comments)    BLOATING  . Motrin [Ibuprofen] Nausea Only and Other (See Comments)    Stomach ahce  . Oxaprozin Nausea Only and Other (See Comments)    Hurt stomach  . Robaxin [Methocarbamol] Nausea Only and Other (See Comments)    Hurt stomach  . Topamax [Topiramate] Nausea And Vomiting        Past Medical History:  Diagnosis Date  . AC (acromioclavicular) joint bone spurs    spurs in shoulders with pain  . Anxiety   . Aortic  insufficiency   . Arthritis    all over  . Asthma    from reflux  . Chronic combined systolic and diastolic CHF (congestive heart failure) (HCC)   . Deafness   . Dysrhythmia   . Fibromyalgia   . GERD (gastroesophageal reflux disease)   . Hypertension   . Hypothyroidism   . Mitral regurgitation   . Non-ischemic cardiomyopathy (HCC)    a. Normal cors 01/2016.  . Osteoarthritis of glenohumeral joint, left    Left shoulder  . Persistent atrial fibrillation (HCC)   . PFO (patent foramen ovale)    a. closure 03/2016.  . S/P aortic valve replacement with bioprosthetic valve 04/13/2016   23 mm [facility] Ease bovine pericardial tissue valve  . S/P mitral valve repair 04/13/2016   26 mm Sorin Memo 3D ring annuloplasty  . 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 Tricuspid regurgitation          Past Surgical History:  Procedure Laterality Date  . ABDOMINAL HYSTERECTOMY     partial  . AORTIC VALVE REPLACEMENT N/A 04/13/2016   Procedure: AORTIC VALVE REPLACEMENT (AVR) implanted with Magna Ease size 23mm;  Surgeon: Purcell Nails, MD;  Location: [facility] OR;  Service: Open Heart Surgery;  Laterality: N/A;  . CARDIAC CATHETERIZATION N/A 02/18/2016   Procedure: Right/Left Heart Cath and  Coronary Angiography;  Surgeon: Laurey Morale, MD;  Location: [facility] INVASIVE CV LAB;  Service: Cardiovascular;  Laterality: N/A;  . CARDIOVERSION N/A 10/16/2014   Procedure: CARDIOVERSION;  Surgeon: Corky Crafts, MD;  Location: [facility] ENDOSCOPY;  Service: Cardiovascular;  Laterality: N/A;  . CARDIOVERSION N/A 12/04/2014   Procedure: CARDIOVERSION;  Surgeon: Thurmon Fair, MD;  Location: [facility] ENDOSCOPY;  Service: Cardiovascular;  Laterality: N/A;  . COLON SURGERY     spurs  . COLONOSCOPY  [id number]  . COLONOSCOPY WITH PROPOFOL  04/09/2012   Procedure: COLONOSCOPY WITH PROPOFOL;  Surgeon: Charolett Bumpers, MD;  Location: [facility]  ENDOSCOPY;  Service: Endoscopy;  Laterality: N/A;  . ESOPHAGOGASTRODUODENOSCOPY ENDOSCOPY     several times  . EYE SURGERY     catarcts  . MAZE N/A 04/13/2016   Procedure: MAZE;  Surgeon: Purcell Nails, MD;  Location: [facility] OR;  Service: Open Heart Surgery;  Laterality: N/A;  . MITRAL VALVE REPAIR N/A 04/13/2016   Procedure: MITRAL VALVE REPAIR (MVR) using 26mm Sorin /MEMO 3D annuloplasty ring;  Surgeon: Purcell Nails, MD;  Location: [facility] OR;  Service: Open Heart Surgery;  Laterality: N/A;  . PATENT FORAMEN OVALE CLOSURE N/A 04/13/2016   Procedure: PATENT FORAMEN OVALE (PFO) CLOSURE;  Surgeon: Purcell Nails, MD;  Location: [facility] OR;  Service: Open Heart Surgery;  Laterality: N/A;  . TEE WITHOUT CARDIOVERSION N/A 02/18/2016   Procedure: TRANSESOPHAGEAL ECHOCARDIOGRAM (TEE);  Surgeon: Laurey Morale, MD;  Location: [facility] ENDOSCOPY;  Service: Cardiovascular;  Laterality: N/A;  . TEE WITHOUT CARDIOVERSION N/A 04/13/2016   Procedure: TRANSESOPHAGEAL ECHOCARDIOGRAM (TEE);  Surgeon: Purcell Nails, MD;  Location: [facility] OR;  Service: Open Heart Surgery;  Laterality: N/A;  . THYROID SURGERY  1991  . TONSILLECTOMY    . TOTAL SHOULDER ARTHROPLASTY Right 07/02/2012   Procedure: TOTAL SHOULDER ARTHROPLASTY;  Surgeon: Mable Paris, MD;  Location: [facility] OR;  Service: Orthopedics;  Laterality: Right;  Right total shoulder arthroplasty  . TOTAL SHOULDER ARTHROPLASTY Left 11/16/2016  . TOTAL SHOULDER ARTHROPLASTY Left 11/16/2016   Procedure: TOTAL SHOULDER ARTHROPLASTY;  Surgeon: Jones Broom, MD;  Location: [facility] OR;  Service: Orthopedics;  Laterality: Left;  Left total shoulder arthroplasty  . TRICUSPID VALVE REPLACEMENT N/A 04/13/2016   Procedure: TRICUSPID VALVE REPAIR using a T26 MC3 Edwards annuloplasty ring;  Surgeon: Purcell Nails, MD;  Location: [facility] OR;  Service: Open Heart Surgery;  Laterality: N/A;  . TUBAL LIGATION    . vericose surgery Right leg  05-1998  . WISDOM TOOTH  EXTRACTION           Family History  Problem Relation Age of Onset  . Lung cancer Father   . Arrhythmia Father   . Hypertension Father   . Arrhythmia Mother   . Hypertension Mother   . Stroke Maternal Grandmother   . Hypertension Maternal Grandmother   . Heart attack Maternal Uncle        X2  . Kidney disease Paternal Grandfather     Social History        Socioeconomic History  . Marital status: Married    Spouse name: Not on file  . Number of children: Not on file  . Years of education: Not on file  . Highest education level: Not on file  Social Needs  . Financial resource strain: Not on file  . Food insecurity - worry: Not on file  . Food insecurity - inability: Not on file  . Transportation needs - medical: Not on  file  . Transportation needs - non-medical: Not on file  Occupational History  . Not on file  Tobacco Use  . Smoking status: Never Smoker  . Smokeless tobacco: Never Used  Substance and Sexual Activity  . Alcohol use: No  . Drug use: No  . Sexual activity: Not on file  Other Topics Concern  . Not on file  Social History Narrative  . Not on file          Current Outpatient Medications on File Prior to Visit  Medication Sig Dispense Refill  . apixaban (ELIQUIS) 5 MG TABS tablet Take 1 tablet (5 mg total) by mouth 2 (two) times daily. 180 tablet 3  . aspirin EC 81 MG EC tablet Take 1 tablet (81 mg total) by mouth daily.    Marland Kitchen atorvastatin (LIPITOR) 20 MG tablet Take 20 mg by mouth daily.    . Calcium Carbonate-Vitamin D (CALTRATE 600+D) 600-400 MG-UNIT per tablet Take 1 tablet by mouth daily.     . carvedilol (COREG) 12.5 MG tablet TAKE 1 TABLET BY MOUTH TWO  TIMES DAILY WITH MEALS 180 tablet 1  . cetirizine (ZYRTEC) 10 MG tablet Take 10 mg by mouth daily as needed for allergies.    . Cyanocobalamin (VITAMIN B-12) 5000 MCG SUBL Place 1 tablet under the tongue daily. For nerve pain in feet and metabolism support     .  diclofenac (VOLTAREN) 75 MG EC tablet Take 75 mg by mouth daily as needed for moderate pain.    . diphenhydrAMINE (BENADRYL) 25 mg capsule Take 25 mg by mouth daily as needed for itching.     . ferrous sulfate 325 (65 FE) MG tablet TAKE 1 TABLET (325 MG TOTAL) BY MOUTH 2 (TWO) TIMES DAILY WITH A MEAL. 180 tablet 3  . fluticasone (FLONASE) 50 MCG/ACT nasal spray Place 2 sprays into both nostrils daily.    . furosemide (LASIX) 40 MG tablet Take 1 tablet (40 mg total) by mouth 2 (two) times daily. 90 tablet 3  . gabapentin (NEURONTIN) 600 MG tablet TAKE 1 TABLET BY MOUTH THREE TIMES A DAY 90 tablet 5  . ipratropium (ATROVENT) 0.06 % nasal spray Place 2 sprays into both nostrils daily.  5  . levothyroxine (SYNTHROID, LEVOTHROID) 125 MCG tablet Take 137 mcg by mouth daily before breakfast.     . LORazepam (ATIVAN) 2 MG tablet Take 1 tablet (2 mg total) by mouth at bedtime. For restful sleep (Patient taking differently: Take 1-2 mg by mouth See admin instructions. Take 1 mg by mouth in the morning as needed for anxiety and take 2 mg by mouth at bedtime) 10 tablet 0  . montelukast (SINGULAIR) 10 MG tablet Take 10 mg by mouth daily before breakfast.     . omeprazole (PRILOSEC) 40 MG capsule Take 40 mg by mouth daily.     . potassium chloride (KLOR-CON) 8 MEQ tablet Take 8 mEq by mouth daily.    . promethazine (PHENERGAN) 25 MG tablet Take 25 mg by mouth 3 (three) times daily as needed for nausea or vomiting.     Marland Kitchen tiZANidine (ZANAFLEX) 2 MG tablet Take 1 tablet (2 mg total) by mouth 2 (two) times daily as needed for muscle spasms. (Patient taking differently: Take 2 mg by mouth 3 (three) times daily as needed for muscle spasms. ) 10 tablet 0  . traMADol (ULTRAM) 50 MG tablet Take 50-100 mg by mouth 2 (two) times daily as needed for moderate pain.  No current facility-administered medications on file prior to visit.     Review of Systems: a complete, 10pt review of systems was  completed with pertinent positives and negatives as documented in the HPI  Physical Exam: There were no vitals filed for this visit. Gen: A&Ox3, no distress, sign language interpreter Head: normocephalic, atraumatic Eyes: extraocular motions intact, anicteric.  Neck: supple without mass or thyromegaly Chest: unlabored respirations, symmetrical air entry, clear bilaterally   Cardiovascular: RRR with palpable distal pulses, no pedal edema Abdomen: soft, nondistended, nontender. No mass or organomegaly.  Extremities: warm, without edema, no deformities  Neuro: grossly intact Psych: appropriate mood and affect, normal insight  Skin: warm and dry. On the mid back, there is a punctate wound draining serosanguineous fluid.  There is no palpable cyst remaining at this time.  Just superior lateral to this there is a poor probably a small sebaceous cyst just next to it.  She also has multiple small skin tags along the broad band line on both sides of her rib cage.  Also complains of a subcentimeter painful cystic nodule just the left of midline in the left breast.   CBC Latest Ref Rng & Units 01/30/2017 11/09/2016 05/22/2016  WBC 3.4 - 10.8 x10E3/uL 6.2 7.1 7.6  Hemoglobin 11.1 - 15.9 g/dL 29.5 62.1 10.9(L)  Hematocrit 34.0 - 46.6 % 41.3 44.6 35.8(L)  Platelets 150 - 379 x10E3/uL 245 227 348    CMP Latest Ref Rng & Units 01/30/2017 11/09/2016 05/22/2016  Glucose 65 - 99 mg/dL 84 80 308(M)  BUN 8 - 27 mg/dL 16 13 8   Creatinine 0.57 - 1.00 mg/dL 5.78(I) 6.96(E) 9.52(W)  Sodium 134 - 144 mmol/L 141 138 139  Potassium 3.5 - 5.2 mmol/L 4.2 4.0 3.7  Chloride 96 - 106 mmol/L 100 101 103  CO2 20 - 29 mmol/L 25 29 24   Calcium 8.7 - 10.3 mg/dL 9.4 9.1 9.2  Total Protein 6.5 - 8.1 g/dL - 6.6 -  Total Bilirubin 0.3 - 1.2 mg/dL - 0.3 -  Alkaline Phos 38 - 126 U/L - 119 -  AST 15 - 41 U/L - 22 -  ALT 14 - 54 U/L - 20 -    RecentLabs       Lab Results  Component Value Date   INR 1.02 11/09/2016    INR 3.5 07/31/2016   INR 1.7 07/24/2016      Imaging: ImagingResults(Last48hours)  No results found.     A/P: 67 year old woman with multiple skin lesions.  We discussed excision of the small medial left breast subcutaneous cyst, excision of bilateral chest wall skin tags, and possible excision of mid back subcutaneous cyst if it is still present at the time of the procedure.  Discussed risk of bleeding, infection, wound problems. She expressed understanding and is eager to proceed.  She will need cardiac clearance and hold eliquis preoperatively. -hold asa 7 days, hold eliquis 2 days, cleared by cards   Phylliss Blakes, MD [facility], [place] Pager [phone]

## 2017-08-03 NOTE — Discharge Instructions (Signed)
GENERAL SURGERY: POST OP INSTRUCTIONS  ######################################################################  EAT Gradually transition to a high fiber diet with a fiber supplement over the next few weeks after discharge.  Start with a pureed / full liquid diet (see below)  WALK Walk an hour a day.  Control your pain to do that.    CONTROL PAIN Control pain so that you can walk, sleep, tolerate sneezing/coughing, go up/down stairs.  HAVE A BOWEL MOVEMENT DAILY Keep your bowels regular to avoid problems.  OK to try a laxative to override constipation.  OK to use an antidairrheal to slow down diarrhea.  Call if not better after 2 tries  CALL IF YOU HAVE PROBLEMS/CONCERNS Call if you are still struggling despite following these instructions. Call if you have concerns not answered by these instructions  ######################################################################    1. DIET: Follow a light bland diet the first 24 hours after arrival home, such as soup, liquids, crackers, etc.  Be sure to include lots of fluids daily.  Avoid fast food or heavy meals as your are more likely to get nauseated.   2. Take your usually prescribed home medications unless otherwise directed. 3. PAIN CONTROL: a. Pain is best controlled by a usual combination of three different methods TOGETHER: i. Ice/Heat ii. Over the counter pain medication iii. Prescription pain medication b. Most patients will experience some swelling and bruising around the incisions.  Ice packs or heating pads (30-60 minutes up to 6 times a day) will help. Use ice for the first few days to help decrease swelling and bruising, then switch to heat to help relax tight/sore spots and speed recovery.  Some people prefer to use ice alone, heat alone, alternating between ice & heat.  Experiment to what works for you.  Swelling and bruising can take several weeks to resolve.   c. It is helpful to take an over-the-counter pain medication  regularly for the first few weeks.  Choose one of the following that works best for you: i. Naproxen (Aleve, etc)  Two 220mg  tabs twice a day ii. Ibuprofen (Advil, etc) Three 200mg  tabs four times a day (every meal & bedtime) iii. Acetaminophen (Tylenol, etc) 500-650mg  four times a day (every meal & bedtime) d. A  prescription for pain medication (such as oxycodone, hydrocodone, etc) should be given to you upon discharge.  Take your pain medication as prescribed.  i. If you are having problems/concerns with the prescription medicine (does not control pain, nausea, vomiting, rash, itching, etc), please call us [phone] to see if we need to switch you to a different pain medicine that will work better for you and/or control your side effect better. ii. If you need a refill on your pain medication, please contact your pharmacy.  They will contact our office to request authorization. Prescriptions will not be filled after 5 pm or on week-ends. 4. Avoid getting constipated.  Between the surgery and the pain medications, it is common to experience some constipation.  Increasing fluid intake and taking a fiber supplement (such as Metamucil, Citrucel, FiberCon, MiraLax, etc) 1-2 times a day regularly will usually help prevent this problem from occurring.  A mild laxative (prune juice, Milk of Magnesia, MiraLax, etc) should be taken according to package directions if there are no bowel movements after 48 hours.   5. Wash / shower every daystarting 2 days after surgery.   Continue to shower over incision(s) after the dressing is off. 6. Remove your waterproof bandages 2 days after surgery.  You  may leave the incision open to air.  You may have skin tapes (Steri Strips) covering the incision(s).  Leave them on until one week, then remove.  You may replace a dressing/Band-Aid to cover the incision for comfort if you wish. After steri strips are removed, OK to use an antibiotic ointment to incisions or wounds.   Any sutures will be removed in the office at your follow up visit.      7. ACTIVITIES as tolerated:   a. You may resume regular (light) daily activities beginning the next day--such as daily self-care, walking, climbing stairs--gradually increasing activities as tolerated.  If you can walk 30 minutes without difficulty, it is safe to try more intense activity such as jogging, treadmill, bicycling, low-impact aerobics, swimming, etc. b. Save the most intensive and strenuous activity for last such as sit-ups, heavy lifting, contact sports, etc  Refrain from any heavy lifting or straining until you are off narcotics for pain control.   c. DO NOT PUSH THROUGH PAIN.  Let pain be your guide: If it hurts to do something, don't do it.  Pain is your body warning you to avoid that activity for another week until the pain goes down. d. You may drive when you are no longer taking prescription pain medication, you can comfortably wear a seatbelt, and you can safely maneuver your car and apply brakes. e. Bonita Quin may have sexual intercourse when it is comfortable.  8. FOLLOW UP in our office a. Please call [facility] at [phone] to set up an appointment to see your surgeon in the office for a follow-up appointment approximately 2-3 weeks after your surgery. b. Make sure that you call for this appointment the day you arrive home to insure a convenient appointment time. 9. IF YOU HAVE DISABILITY OR FAMILY LEAVE FORMS, BRING THEM TO THE OFFICE FOR PROCESSING.  DO NOT GIVE THEM TO YOUR DOCTOR.   WHEN TO CALL us [phone]: 1. Poor pain control 2. Reactions / problems with new medications (rash/itching, nausea, etc)  3. Fever over 101.5 F (38.5 C) 4. Worsening swelling or bruising 5. Continued bleeding from incision. 6. Increased pain, redness, or drainage from the incision 7. Difficulty breathing / swallowing   The clinic staff is available to answer your questions during regular business hours (8:30am-5pm).   Please dont hesitate to call and ask to speak to one of our nurses for clinical concerns.   If you have a medical emergency, go to the nearest emergency room or call 911.  A surgeon from [facility] is always on call at the [facility], [place] [address], [place], [place]  [address] ? MAIN: [phone] ? TOLL FREE: [phone] ?  FAX [phone] [email or web address]   Post Anesthesia Home Care Instructions  Activity: Get plenty of rest for the remainder of the day. A responsible individual must stay with you for 24 hours following the procedure.  For the next 24 hours, DO NOT: -Drive a car -Advertising copywriter -Drink alcoholic beverages -Take any medication unless instructed by your physician -Make any legal decisions or sign important papers.  Meals: Start with liquid foods such as gelatin or soup. Progress to regular foods as tolerated. Avoid greasy, spicy, heavy foods. If nausea and/or vomiting occur, drink only clear liquids until the nausea and/or vomiting subsides. Call your physician if vomiting continues.  Special Instructions/Symptoms: Your throat may feel dry or sore from the anesthesia or the breathing tube  placed in your throat during surgery. If this causes discomfort, gargle with warm salt water. The discomfort should disappear within 24 hours.  If you had a scopolamine patch placed behind your ear for the management of post- operative nausea and/or vomiting:  1. The medication in the patch is effective for 72 hours, after which it should be removed.  Wrap patch in a tissue and discard in the trash. Wash hands thoroughly with soap and water. 2. You may remove the patch earlier than 72 hours if you experience unpleasant side effects which may include dry mouth, dizziness or visual disturbances. 3. Avoid touching the patch. Wash your hands with soap and water after contact with the patch.

## 2017-08-03 NOTE — Anesthesia Preprocedure Evaluation (Signed)
Anesthesia Evaluation  Patient identified by MRN, date of birth, ID band Patient awake    Reviewed: Allergy & Precautions, NPO status , Patient's Chart, lab work & pertinent test results  Airway Mallampati: II  TM Distance: >3 FB Neck ROM: Full    Dental no notable dental hx.    Pulmonary neg pulmonary ROS,    Pulmonary exam normal breath sounds clear to auscultation       Cardiovascular hypertension, Normal cardiovascular exam+ dysrhythmias Atrial Fibrillation  Rhythm:Regular Rate:Normal  S/P aortic valve replacement with bioprosthetic valve 04/13/2016 23 mm [facility] Ease bovine pericardial tissue valve S/P tricuspid valve repair 04/13/2016 26 mm Edwards mc3 ring annuloplasty    Neuro/Psych negative neurological ROS  negative psych ROS   GI/Hepatic Neg liver ROS, GERD  ,  Endo/Other  Hypothyroidism   Renal/GU negative Renal ROS  negative genitourinary   Musculoskeletal negative musculoskeletal ROS (+)   Abdominal   Peds negative pediatric ROS (+)  Hematology negative hematology ROS (+)   Anesthesia Other Findings   Reproductive/Obstetrics negative OB ROS                             Anesthesia Physical Anesthesia Plan  ASA: III  Anesthesia Plan: MAC   Post-op Pain Management:    Induction: Intravenous  PONV Risk Score and Plan: 2 and Ondansetron, Dexamethasone and Treatment may vary due to age or medical condition  Airway Management Planned: Simple Face Mask  Additional Equipment:   Intra-op Plan:   Post-operative Plan:   Informed Consent: I have reviewed the patients History and Physical, chart, labs and discussed the procedure including the risks, benefits and alternatives for the proposed anesthesia with the patient or authorized representative who has indicated his/her understanding and acceptance.   Dental advisory given  Plan Discussed with: CRNA and  Surgeon  Anesthesia Plan Comments:         Anesthesia Quick Evaluation

## 2017-08-06 ENCOUNTER — Encounter (HOSPITAL_BASED_OUTPATIENT_CLINIC_OR_DEPARTMENT_OTHER): Payer: Self-pay | Admitting: Surgery

## 2017-08-08 ENCOUNTER — Other Ambulatory Visit (HOSPITAL_COMMUNITY): Payer: Self-pay | Admitting: *Deleted

## 2017-08-08 NOTE — Pre-Procedure Instructions (Signed)
Allison [name]  08/08/2017      [facility] SERVICE - [name],  - [address] [facility] [address] [place] [address] [name]  [address] Phone: [phone] Fax: [phone]  Allison [name]/pharmacy [phone] Allison [name], [place] - [address] [facility] [place] [address] Phone: [phone] Fax: [phone]    Your procedure is scheduled on 08-21-2017 Tuesday  Report to [name] Admitting at 8:15 A.M.   Call this number if you have problems the morning of surgery:  [phone]   Remember:  Do not eat food or drink liquids after midnight.   Take these medicines the morning of surgery with A SIP OF WATER   Atorvastatin(Lipitor) Carvedilol(Coreg) Flonase Nasal spray Gabapentin(Neurontin) Pain medication as needed Levothyroxine(Synthroid) Lorazepam(Ativan) Montelukast(Singular) Tizanidine(Zanaflex)     STOP TAKING ANY ASPIRIN(UNLESS OTHERWISE INSTRUCTED BY YOUR SURGEON),ANTIINFLAMATORIES (IBUPROFEN,ALEVE,MOTRIN,ADVIL,GOODY'S POWDERS),HERBAL SUPPLEMENTS,FISH OIL,AND VITAMINS 5-7 DAYS PRIOR TO SURGERY   Stop Eliquis  3 days prior to surgery   Do not wear jewelry, make-up or nail polish.  Do not wear lotions, powders, or perfumes, or deodorant.  Do not shave 48 hours prior to surgery.  Men may shave face and neck.  Do not bring valuables to the hospital.  [name] is not responsible for any belongings or valuables.  Contacts, dentures or bridgework may not be worn into surgery.  Leave your suitcase in the car.  After surgery it may be brought to your room.  For patients admitted to the hospital, discharge time will be determined by your treatment team.  Patients discharged the day of surgery will not be allowed to drive home.    Special Instructions: [place] - Preparing for Surgery  Before surgery, you can play an important role.  Because skin is not sterile, your skin needs to be as free of germs as possible.  You can reduce the  number of germs on you skin by washing with CHG (chlorahexidine gluconate) soap before surgery.  CHG is an antiseptic cleaner which kills germs and bonds with the skin to continue killing germs even after washing.  Please DO NOT use if you have an allergy to CHG or antibacterial soaps.  If your skin becomes reddened/irritated stop using the CHG and inform your nurse when you arrive at Short Stay.  Do not shave (including legs and underarms) for at least 48 hours prior to the first CHG shower.  You may shave your face.  Please follow these instructions carefully:   1.  Shower with CHG Soap the night before surgery and the   morning of Surgery.  2.  If you choose to wash your hair, wash your hair first as usual with your normal shampoo.  3.  After you shampoo, rinse your hair and body thoroughly to remove the  Shampoo.  4.  Use CHG as you would any other liquid soap.  You can apply chg directly  to the skin and wash gently with scrungie or a clean washcloth.  5.  Apply the CHG Soap to your body ONLY FROM THE NECK DOWN.   Do not use on open wounds or open sores.  Avoid contact with your eyes,  ears, mouth and genitals (private parts).  Wash genitals (private parts) with your normal soap.  6.  Wash thoroughly, paying special attention to the area where your surgery will be performed.  7.  Thoroughly rinse your body with warm water from the neck down.  8.  DO NOT shower/wash  with your normal soap after using and rinsing o  the CHG Soap.  9.  Pat yourself dry with a clean towel.            10.  Wear clean pajamas.            11.  Place clean sheets on your bed the night of your first shower and do not sleep with pets.  Day of Surgery  Do not apply any lotions/deodorants the morning of surgery.  Please wear clean clothes to the hospital/surgery center.   Please read over the following fact sheets that you were given. MRSA Information and Surgical Site Infection Prevention Incentive  Spirometry

## 2017-08-09 ENCOUNTER — Encounter (HOSPITAL_COMMUNITY): Payer: Self-pay

## 2017-08-09 ENCOUNTER — Encounter (HOSPITAL_COMMUNITY)
Admission: RE | Admit: 2017-08-09 | Discharge: 2017-08-09 | Disposition: A | Discharge status: Home / Self Care | Payer: Medicare Other | Source: Ambulatory Visit | Attending: Orthopaedic Surgery | Admitting: Orthopaedic Surgery

## 2017-08-09 ENCOUNTER — Other Ambulatory Visit: Payer: Self-pay

## 2017-08-09 DIAGNOSIS — Z01812 Encounter for preprocedural laboratory examination: Secondary | ICD-10-CM | POA: Insufficient documentation

## 2017-08-09 HISTORY — DX: Polyneuropathy, unspecified: G62.9

## 2017-08-09 HISTORY — DX: Constipation, unspecified: K59.00

## 2017-08-09 HISTORY — DX: Hyperlipidemia, unspecified: E78.5

## 2017-08-09 HISTORY — DX: Atherosclerotic heart disease of native coronary artery without angina pectoris: I25.10

## 2017-08-09 HISTORY — DX: Fatty (change of) liver, not elsewhere classified: K76.0

## 2017-08-09 HISTORY — DX: Irritable bowel syndrome, unspecified: K58.9

## 2017-08-09 HISTORY — DX: Prediabetes: R73.03

## 2017-08-09 HISTORY — DX: Other specified disorders of bone density and structure, unspecified site: M85.80

## 2017-08-09 LAB — BASIC METABOLIC PANEL
Anion gap: 13 (ref 5–15)
BUN: 12 mg/dL (ref 6–20)
CO2: 24 mmol/L (ref 22–32)
Calcium: 9.2 mg/dL (ref 8.9–10.3)
Chloride: 103 mmol/L (ref 101–111)
Creatinine, Ser: 1.21 mg/dL — ABNORMAL HIGH (ref 0.44–1.00)
GFR calc Af Amer: 52 mL/min — ABNORMAL LOW (ref >60)
GFR calc non Af Amer: 45 mL/min — ABNORMAL LOW (ref >60)
GLUCOSE: 81 mg/dL (ref 65–99)
POTASSIUM: 3.8 mmol/L (ref 3.5–5.1)
SODIUM: 140 mmol/L (ref 135–145)

## 2017-08-09 LAB — URINALYSIS, ROUTINE W REFLEX MICROSCOPIC
BILIRUBIN URINE: NEGATIVE
Bacteria, UA: NONE SEEN
Glucose, UA: NEGATIVE mg/dL
Ketones, ur: NEGATIVE mg/dL
Leukocytes, UA: NEGATIVE
NITRITE: NEGATIVE
PH: 6 (ref 5.0–8.0)
Protein, ur: NEGATIVE mg/dL
SPECIFIC GRAVITY, URINE: 1.005 (ref 1.005–1.030)
Squamous Epithelial / LPF: NONE SEEN

## 2017-08-09 LAB — CBC WITH DIFFERENTIAL/PLATELET
Basophils Absolute: 0 10*3/uL (ref 0.0–0.1)
Basophils Relative: 1 %
EOS PCT: 6 %
Eosinophils Absolute: 0.4 10*3/uL (ref 0.0–0.7)
HCT: 41.1 % (ref 36.0–46.0)
Hemoglobin: 12.6 g/dL (ref 12.0–15.0)
LYMPHS ABS: 1.9 10*3/uL (ref 0.7–4.0)
LYMPHS PCT: 29 %
MCH: 28.4 pg (ref 26.0–34.0)
MCHC: 30.7 g/dL (ref 30.0–36.0)
MCV: 92.6 fL (ref 78.0–100.0)
Monocytes Absolute: 0.4 10*3/uL (ref 0.1–1.0)
Monocytes Relative: 7 %
Neutro Abs: 3.8 10*3/uL (ref 1.7–7.7)
Neutrophils Relative %: 57 %
PLATELETS: 256 10*3/uL (ref 150–400)
RBC: 4.44 MIL/uL (ref 3.87–5.11)
RDW: 13.9 % (ref 11.5–15.5)
WBC: 6.5 10*3/uL (ref 4.0–10.5)

## 2017-08-09 LAB — PROTIME-INR
INR: 1.16
Prothrombin Time: 14.7 seconds (ref 11.4–15.2)

## 2017-08-09 LAB — SURGICAL PCR SCREEN
MRSA, PCR: NEGATIVE
Staphylococcus aureus: NEGATIVE

## 2017-08-09 LAB — TYPE AND SCREEN
ABO/RH(D): O POS
ANTIBODY SCREEN: NEGATIVE

## 2017-08-09 LAB — APTT: aPTT: 35 seconds (ref 24–36)

## 2017-08-09 NOTE — Pre-Procedure Instructions (Signed)
Allison [name]  08/09/2017      [facility] SERVICE - [place], [place] - [address] [facility] [address] [place] [address] [place] [address] Phone: [phone] Fax: [phone]  [id number] [place], [place] - [address] [facility] [place] [address] Phone: [phone] Fax: [phone]    Your procedure is scheduled on 08-21-2017 Tuesday  Report to [facility] Admitting at 8:15 A.M.   Call this number if you have problems the morning of surgery:  [phone]   Remember:  Do not eat food or drink liquids after midnight.   Take these medicines the morning of surgery with A SIP OF WATER   Atorvastatin(Lipitor) Carvedilol(Coreg) Flonase Nasal spray Gabapentin(Neurontin) Pain medication as needed Levothyroxine(Synthroid) Lorazepam(Ativan) Montelukast(Singular) Tizanidine(Zanaflex)  Omeprazole(Prilosec)    STOP TAKING ANY ASPIRIN(UNLESS OTHERWISE INSTRUCTED BY YOUR SURGEON),ANTIINFLAMATORIES (IBUPROFEN,ALEVE,MOTRIN,ADVIL,GOODY'S POWDERS),HERBAL SUPPLEMENTS,FISH OIL,AND VITAMINS 5-7 DAYS PRIOR TO SURGERY   Stop Eliquis  3 days prior to surgery   Do not wear jewelry, make-up or nail polish.  Do not wear lotions, powders, or perfumes, or deodorant.  Do not shave 48 hours prior to surgery.  Men may shave face and neck.  Do not bring valuables to the hospital.  [facility] is not responsible for any belongings or valuables.  Contacts, dentures or bridgework may not be worn into surgery.  Leave your suitcase in the car.  After surgery it may be brought to your room.  For patients admitted to the hospital, discharge time will be determined by your treatment team.  Patients discharged the day of surgery will not be allowed to drive home.    Special Instructions: [place] - Preparing for Surgery  Before surgery, you can play an important role.  Because skin is not sterile, your skin needs to be as free of germs as  possible.  You can reduce the number of germs on you skin by washing with CHG (chlorahexidine gluconate) soap before surgery.  CHG is an antiseptic cleaner which kills germs and bonds with the skin to continue killing germs even after washing.  Please DO NOT use if you have an allergy to CHG or antibacterial soaps.  If your skin becomes reddened/irritated stop using the CHG and inform your nurse when you arrive at Short Stay.  Do not shave (including legs and underarms) for at least 48 hours prior to the first CHG shower.  You may shave your face.  Please follow these instructions carefully:   1.  Shower with CHG Soap the night before surgery and the   morning of Surgery.  2.  If you choose to wash your hair, wash your hair first as usual with your normal shampoo.  3.  After you shampoo, rinse your hair and body thoroughly to remove the  Shampoo.  4.  Use CHG as you would any other liquid soap.  You can apply chg directly  to the skin and wash gently with scrungie or a clean washcloth.  5.  Apply the CHG Soap to your body ONLY FROM THE NECK DOWN.   Do not use on open wounds or open sores.  Avoid contact with your eyes,  ears, mouth and genitals (private parts).  Wash genitals (private parts) with your normal soap.  6.  Wash thoroughly, paying special attention to the area where your surgery will be performed.  7.  Thoroughly rinse your body with warm water from the neck down.  8.  DO NOT  shower/wash with your normal soap after using and rinsing o  the CHG Soap.  9.  Pat yourself dry with a clean towel.            10.  Wear clean pajamas.            11.  Place clean sheets on your bed the night of your first shower and do not sleep with pets.  Day of Surgery  Do not apply any lotions/deodorants the morning of surgery.  Please wear clean clothes to the hospital/surgery center.   Please read over the following fact sheets that you were given. MRSA Information and Surgical Site Infection  Prevention Incentive Spirometry

## 2017-08-10 NOTE — Progress Notes (Signed)
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 Last office visit 02/20/17. Pt cleared for surgery by Robbie Lis, PA on 07/18/17  Patient is a 67 year old female scheduled for R total knee arthroplasty on 08/21/2017 with Marcene Corning, M.D.  [facility] includes:  - Mitral regurgitation, aortic insufficiency, tricuspid regurgitation, PFO, persistent afib (s/p aortic valve replacement, mitral valve repair, tricuspid valve repair, closure of PFO, Maze procedure 04/13/16).    - Dilated nonischemic cardiomyopathy, chronic combined systolic and diastolic CHF, HTN, hypothyroidism, asthma, fatty liver, GERD.  - Never smoker. BMI 33. - S/p R shoulder arthroplasty 07/02/12. S/p L shoulder arthroplasty 11/16/16  Medications include: eliquis, ASA 81 mg, Lipitor, carvedilol, Lasix, levothyroxine, Prilosec, potassium, zantac. Pt to hold Eliquis 3 days before surgery.   BP (!) 122/59   Pulse 71   Temp 36.8 C   Resp 18   Ht 5' 6.5" (1.689 m)   Wt 207 lb 1.6 oz (93.9 kg)   SpO2 99%   BMI 32.93 kg/m    Preoperative labs reviewed.  CXR 11/09/16: No active cardiopulmonary disease.  EKG 12/1316: NSR  Echo 05/11/16: - Left ventricle: The cavity size was normal. Wall thickness wasnormal. Systolic function was normal. The estimated ejectionfraction was in the range of 50% to 55%. Wall motion was normal;there were no regional wall motion abnormalities. - Aortic valve: A bioprosthesis was present. - Mitral valve: A bioprosthesis was present. - Left atrium: The atrium was moderately dilated. - Tricuspid valve: Prior procedures included surgical repair.  Carotid duplex 04/10/16:  - Findings suggest 1-39% internal carotid artery stenosis bilaterally.  - Vertebral arteries are patent with antegrade flow.  Cardiac cath 02/18/16:  1. No angiographic coronary disease.  2.  Elevated PCWP but normal RA pressure. Prominent V-waves in PCWP tracing.  3. Low cardiac output, suspect this is related to the severe mitral regurgitation that is known to be present from TEE. LV-gram not done given TEE today and CKD.   If no changes, I anticipate pt can proceed with surgery as scheduled.   Rica Mast, FNP-BC [facility] Short Stay Surgical Center/Anesthesiology Phone: [phone] 08/10/2017 2:11 PM

## 2017-08-14 ENCOUNTER — Telehealth: Payer: Self-pay

## 2017-08-14 ENCOUNTER — Other Ambulatory Visit: Payer: Self-pay

## 2017-08-14 MED ORDER — APIXABAN 5 MG PO TABS: 5.0000 mg | ORAL_TABLET | Freq: Two times a day (BID) | ORAL | 3 refills | Status: DC

## 2017-08-14 NOTE — Telephone Encounter (Signed)
**Note De-Identified  Obfuscation** The pt brought her 2018 proof of income and her 2019 out of pocket expense report from her pharmacy.  While here she requested and received 2 boxes of Eliquis 5 mg samples.  I have faxed her [employer] pt assistance application and all documents to [employer].

## 2017-08-15 NOTE — Progress Notes (Signed)
Allison [name] called via interpreter to let us know about a medication dose change.  Her gabapentin went from 800 to 600mg  TID as of today.  I did make the correction in the [facility].

## 2017-08-20 MED ORDER — SODIUM CHLORIDE 0.9 % IV SOLN
1000.0000 mg | INTRAVENOUS | Status: AC
  Administered 2017-08-21: 1000 mg via INTRAVENOUS
  Filled 2017-08-20: qty 1100

## 2017-08-20 MED ORDER — LACTATED RINGERS IV SOLN
INTRAVENOUS | Status: DC
  Administered 2017-08-21: 12:00:00 via INTRAVENOUS

## 2017-08-20 MED ORDER — TRANEXAMIC ACID 1000 MG/10ML IV SOLN
2000.0000 mg | INTRAVENOUS | Status: AC
  Administered 2017-08-21: 2000 mg via TOPICAL
  Filled 2017-08-20: qty 20

## 2017-08-20 MED ORDER — CEFAZOLIN SODIUM-DEXTROSE 2-4 GM/100ML-% IV SOLN
2.0000 g | INTRAVENOUS | Status: AC
  Administered 2017-08-21: 2 g via INTRAVENOUS
  Filled 2017-08-20: qty 100

## 2017-08-20 NOTE — Progress Notes (Signed)
Notified sign language interpreter of surgery time change.

## 2017-08-20 NOTE — Telephone Encounter (Signed)
**Note De-Identified  Obfuscation** Pt assistance for Eliquis has been approved through [employer]. Approval good until 04/30/2018.

## 2017-08-20 NOTE — H&P (Signed)
TOTAL KNEE ADMISSION H&P  Patient is being admitted for right total knee arthroplasty.  Subjective:  Chief Complaint:right knee pain.  HPI: Allison [name], 67 y.o. female, has a history of pain and functional disability in the right knee due to arthritis and has failed non-surgical conservative treatments for greater than 12 weeks to includeNSAID's and/or analgesics, corticosteriod injections, flexibility and strengthening excercises, use of assistive devices, weight reduction as appropriate and activity modification.  Onset of symptoms was gradual, starting 5 years ago with gradually worsening course since that time. The patient noted no past surgery on the right knee(s).  Patient currently rates pain in the right knee(s) at 10 out of 10 with activity. Patient has night pain, worsening of pain with activity and weight bearing, pain that interferes with activities of daily living, crepitus and joint swelling.  Patient has evidence of subchondral cysts, subchondral sclerosis, periarticular osteophytes and joint space narrowing by imaging studies. There is no active infection.  Patient Active Problem List   Diagnosis Date Noted  . Spider veins of both lower extremities 01/16/2017  . S/P shoulder replacement, left 11/16/2016  . Encounter for therapeutic drug monitoring 05/12/2016  . LBBB (left bundle branch block) 05/09/2016  . 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 S/P aortic valve replacement with bioprosthetic valve 04/13/2016  . S/P tricuspid valve repair 04/13/2016  . S/P Maze operation for atrial fibrillation 04/13/2016  . Tricuspid regurgitation   . Mitral regurgitation   . Deafness   . Aortic insufficiency   . Chronic combined systolic and diastolic CHF (congestive heart failure) (HCC)   . Non-ischemic cardiomyopathy (HCC)   . Anticoagulated 01/20/2016  . Persistent atrial fibrillation (HCC)   . Hypothyroidism  09/17/2014  . Dyspnea 11/19/2013  . Edema 10/01/2013  . Dysphagia 08/28/2013  . Arthritis of right shoulder region 07/03/2012   Past Medical History:  Diagnosis Date  . AC (acromioclavicular) joint bone spurs    spurs in shoulders with pain  . Anxiety   . Aortic insufficiency   . Arthritis    all over  . Chronic combined systolic and diastolic CHF (congestive heart failure) (HCC)   . Constipation   . Coronary artery disease   . Deafness   . Dysrhythmia   . Fatty liver   . Fibromyalgia   . GERD (gastroesophageal reflux disease)   . Hyperlipidemia   . Hypertension   . Hypothyroidism   . IBS (irritable bowel syndrome)   . Mitral regurgitation   . Neuropathy   . Non-ischemic cardiomyopathy (HCC)    a. Normal cors 01/2016.  . Osteoarthritis of glenohumeral joint, left    Left shoulder  . Osteopenia   . Persistent atrial fibrillation (HCC)   . PFO (patent foramen ovale)    a. closure 03/2016.  . Pre-diabetes   . S/P aortic valve replacement with bioprosthetic valve 04/13/2016   23 mm [facility] Ease bovine pericardial tissue valve  . S/P mitral valve repair 04/13/2016   26 mm Sorin Memo 3D ring annuloplasty  . 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 Tricuspid regurgitation     Past Surgical History:  Procedure Laterality Date  . ABDOMINAL HYSTERECTOMY     partial  . AORTIC VALVE REPLACEMENT N/A 04/13/2016   Procedure: AORTIC VALVE REPLACEMENT (AVR) implanted with Magna Ease size 27mm;  Surgeon: Purcell Nails, MD;  Location: [facility] OR;  Service:  Open Heart Surgery;  Laterality: N/A;  . BREAST CYST EXCISION N/A 08/03/2017   Procedure: EXCISION OF MEDIAL LEFT BREAST SUBCUTANEOUS CYST AND EXCISION OF SUBCUTANEOUS CYST OF MID BACK;  Surgeon: Berna Bue, MD;  Location: [facility];  Service: General;  Laterality: N/A;  . CARDIAC CATHETERIZATION N/A 02/18/2016   Procedure: Right/Left Heart Cath and Coronary  Angiography;  Surgeon: Laurey Morale, MD;  Location: [facility] INVASIVE CV LAB;  Service: Cardiovascular;  Laterality: N/A;  . CARDIOVERSION N/A 10/16/2014   Procedure: CARDIOVERSION;  Surgeon: Corky Crafts, MD;  Location: [facility] ENDOSCOPY;  Service: Cardiovascular;  Laterality: N/A;  . CARDIOVERSION N/A 12/04/2014   Procedure: CARDIOVERSION;  Surgeon: Thurmon Fair, MD;  Location: [facility] ENDOSCOPY;  Service: Cardiovascular;  Laterality: N/A;  . COLON SURGERY     spurs  . COLONOSCOPY  [id number]  . COLONOSCOPY WITH PROPOFOL  04/09/2012   Procedure: COLONOSCOPY WITH PROPOFOL;  Surgeon: Charolett Bumpers, MD;  Location: [facility] ENDOSCOPY;  Service: Endoscopy;  Laterality: N/A;  . ESOPHAGOGASTRODUODENOSCOPY ENDOSCOPY     several times  . EXCISION OF SKIN TAG N/A 08/03/2017   Procedure: EXCISION OF BILATERAL CHEST WALL SKINS TAGS;  Surgeon: Berna Bue, MD;  Location: [facility];  Service: General;  Laterality: N/A;  . EYE SURGERY     catarcts  . MAZE N/A 04/13/2016   Procedure: MAZE;  Surgeon: Purcell Nails, MD;  Location: [facility] OR;  Service: Open Heart Surgery;  Laterality: N/A;  . MITRAL VALVE REPAIR N/A 04/13/2016   Procedure: MITRAL VALVE REPAIR (MVR) using 26mm Sorin /MEMO 3D annuloplasty ring;  Surgeon: Purcell Nails, MD;  Location: [facility] OR;  Service: Open Heart Surgery;  Laterality: N/A;  . PATENT FORAMEN OVALE CLOSURE N/A 04/13/2016   Procedure: PATENT FORAMEN OVALE (PFO) CLOSURE;  Surgeon: Purcell Nails, MD;  Location: [facility] OR;  Service: Open Heart Surgery;  Laterality: N/A;  . TEE WITHOUT CARDIOVERSION N/A 02/18/2016   Procedure: TRANSESOPHAGEAL ECHOCARDIOGRAM (TEE);  Surgeon: Laurey Morale, MD;  Location: [facility] ENDOSCOPY;  Service: Cardiovascular;  Laterality: N/A;  . TEE WITHOUT CARDIOVERSION N/A 04/13/2016   Procedure: TRANSESOPHAGEAL ECHOCARDIOGRAM (TEE);  Surgeon: Purcell Nails, MD;  Location: [facility] OR;  Service: Open Heart Surgery;  Laterality: N/A;  . THYROID SURGERY  1991   . TONSILLECTOMY    . TOTAL SHOULDER ARTHROPLASTY Right 07/02/2012   Procedure: TOTAL SHOULDER ARTHROPLASTY;  Surgeon: Mable Paris, MD;  Location: [facility] OR;  Service: Orthopedics;  Laterality: Right;  Right total shoulder arthroplasty  . TOTAL SHOULDER ARTHROPLASTY Left 11/16/2016  . TOTAL SHOULDER ARTHROPLASTY Left 11/16/2016   Procedure: TOTAL SHOULDER ARTHROPLASTY;  Surgeon: Jones Broom, MD;  Location: [facility] OR;  Service: Orthopedics;  Laterality: Left;  Left total shoulder arthroplasty  . TRICUSPID VALVE REPLACEMENT N/A 04/13/2016   Procedure: TRICUSPID VALVE REPAIR using a T26 MC3 Edwards annuloplasty ring;  Surgeon: Purcell Nails, MD;  Location: [facility] OR;  Service: Open Heart Surgery;  Laterality: N/A;  . TUBAL LIGATION    . vericose surgery Right leg  05-1998  . WISDOM TOOTH EXTRACTION      No current facility-administered medications for this encounter.    Current Outpatient Medications  Medication Sig Dispense Refill Last Dose  . aspirin EC 81 MG EC tablet Take 1 tablet (81 mg total) by mouth daily.   Past Week at Unknown time  . atorvastatin (LIPITOR) 20 MG tablet Take 20 mg by mouth daily.   08/02/2017 at Unknown  time  . Calcium Carbonate-Vitamin D (CALTRATE 600+D) 600-400 MG-UNIT per tablet Take 1 tablet by mouth daily.    08/02/2017 at Unknown time  . carvedilol (COREG) 12.5 MG tablet TAKE 1 TABLET BY MOUTH TWO  TIMES DAILY WITH MEALS 180 tablet 1 08/02/2017 at 2200  . Cyanocobalamin (VITAMIN B-12) 5000 MCG SUBL Place 1 tablet under the tongue daily. For nerve pain in feet and metabolism support    08/02/2017 at Unknown time  . diclofenac (VOLTAREN) 75 MG EC tablet Take 75 mg by mouth daily as needed for moderate pain.   08/02/2017 at Unknown time  . diphenhydrAMINE (BENADRYL) 25 mg capsule Take 25 mg by mouth daily as needed for itching.    08/02/2017 at Unknown time  . fluticasone (FLONASE) 50 MCG/ACT nasal spray Place 2 sprays into both nostrils daily.   08/02/2017 at Unknown time  .  furosemide (LASIX) 40 MG tablet Take 1 tablet (40 mg total) by mouth 2 (two) times daily. 180 tablet 3 08/02/2017 at Unknown time  . levothyroxine (SYNTHROID, LEVOTHROID) 137 MCG tablet Take 137 mcg by mouth daily before breakfast.    08/03/2017 at 0330  . LORazepam (ATIVAN) 2 MG tablet Take 1 tablet (2 mg total) by mouth at bedtime. For restful sleep (Patient taking differently: Take 1-2 mg by mouth See admin instructions. Take 1 mg by mouth in the morning as needed for anxiety and take 2 mg by mouth at bedtime) 10 tablet 0 08/02/2017 at Unknown time  . montelukast (SINGULAIR) 10 MG tablet Take 10 mg by mouth daily before breakfast.    08/03/2017 at 0330  . omeprazole (PRILOSEC) 40 MG capsule Take 40 mg by mouth daily.    08/03/2017 at Unknown time  . potassium chloride SA (K-DUR,KLOR-CON) 20 MEQ tablet Take 1 tablet (20 mEq total) by mouth 2 (two) times daily. 180 tablet 3 08/02/2017 at Unknown time  . promethazine (PHENERGAN) 25 MG tablet Take 25 mg by mouth 3 (three) times daily as needed for nausea or vomiting.    Past Month at Unknown time  . tiZANidine (ZANAFLEX) 2 MG tablet Take 1 tablet (2 mg total) by mouth 2 (two) times daily as needed for muscle spasms. (Patient taking differently: Take 2 mg by mouth 3 (three) times daily as needed for muscle spasms. ) 10 tablet 0 08/02/2017 at Unknown time  . traMADol (ULTRAM) 50 MG tablet Take 50-100 mg by mouth 2 (two) times daily as needed for moderate pain.    08/02/2017 at Unknown time  . apixaban (ELIQUIS) 5 MG TABS tablet Take 1 tablet (5 mg total) by mouth 2 (two) times daily. 180 tablet 3   . cetirizine (ZYRTEC) 10 MG tablet Take 10 mg by mouth daily.     Marland Kitchen docusate sodium (COLACE) 100 MG capsule Take 1 capsule (100 mg total) by mouth 2 (two) times daily. 60 capsule 0   . gabapentin (NEURONTIN) 600 MG tablet Take 600 mg by mouth 3 (three) times daily.    08/03/2017 at 0330  . HYDROcodone-acetaminophen (NORCO/VICODIN) 5-325 MG tablet Take 1 tablet by mouth every 6  (six) hours as needed for moderate pain. 20 tablet 0   . Polyethylene Glycol 3350 POWD by Does not apply route 2 (two) times daily.     . ranitidine (ZANTAC) 150 MG tablet Take 150 mg by mouth daily as needed for heartburn.      Allergies  Allergen Reactions  . Biaxin [Clarithromycin] Nausea Only and Other (See Comments)  Dizzy, blurred vision, achy stomach  . Trazodone And Nefazodone Other (See Comments)    Weakness in legs Numbness in arms Funny feeling Rapid heart beat Lose focus  . Sulfa Antibiotics Other (See Comments)    Blister on large toe  . Aleve [Naproxen] Nausea And Vomiting and Other (See Comments)    Stomach ache  . Bextra [Valdecoxib] Nausea And Vomiting and Other (See Comments)    Stomach ache   . Doxycycline Nausea Only and Other (See Comments)    Stomach ache  . Durabac [Apap-Salicyl-Phenyltolox-Caff] Nausea And Vomiting  . Estradiol Nausea Only and Other (See Comments)    Hurt stomach  . Macrobid [Nitrofurantoin] Other (See Comments)    BLOATING  . Motrin [Ibuprofen] Nausea Only and Other (See Comments)    Stomach ahce  . Oxaprozin Nausea Only and Other (See Comments)    Hurt stomach  . Robaxin [Methocarbamol] Nausea Only and Other (See Comments)    Hurt stomach  . Topamax [Topiramate] Nausea And Vomiting    Social History   Tobacco Use  . Smoking status: Never Smoker  . Smokeless tobacco: Never Used  Substance Use Topics  . Alcohol use: No    Family History  Problem Relation Age of Onset  . Lung cancer Father   . Arrhythmia Father   . Hypertension Father   . Arrhythmia Mother   . Hypertension Mother   . Stroke Maternal Grandmother   . Hypertension Maternal Grandmother   . Heart attack Maternal Uncle        X2  . Kidney disease Paternal Grandfather      Review of Systems  Musculoskeletal: Positive for joint pain.       Right knee  All other systems reviewed and are negative.   Objective:  Physical Exam  Constitutional: She is  oriented to person, place, and time. She appears well-developed and well-nourished.  HENT:  Head: Normocephalic and atraumatic.  Eyes: Pupils are equal, round, and reactive to light.  Neck: Normal range of motion.  Cardiovascular: Normal rate.  Respiratory: Effort normal.  GI: Soft.  Musculoskeletal:  Right knee motion is about 5-95.  She has lateral joint line pain and crepitation but no effusion.  There are no scars.  Hip motion is full and straight leg raise is negative.  Sensation and motor function are intact distally with palpable pulses on both sides  Neurological: She is alert and oriented to person, place, and time.  Skin: Skin is warm and dry.  Psychiatric: She has a normal mood and affect. Her behavior is normal. Judgment and thought content normal.    Vital signs in last 24 hours:    Labs:   Estimated body mass index is 32.93 kg/m as calculated from the following:   Height as of 08/09/17: 5' 6.5" (1.689 m).   Weight as of 08/09/17: 93.9 kg (207 lb 1.6 oz).   Imaging Review Plain radiographs demonstrate severe degenerative joint disease of the right knee(s). The overall alignment isneutral. The bone quality appears to be good for age and reported activity level.   Preoperative templating of the joint replacement has been completed, documented, and submitted to the Operating Room personnel in order to optimize intra-operative equipment management.   Anticipated LOS equal to or greater than 2 midnights due to - Age 14 and older with one or more of the following:  - Obesity  - Expected need for hospital services (PT, OT, Nursing) required for safe  discharge  - Anticipated need  for postoperative skilled nursing care or inpatient rehab  - Active co-morbidities: Cardiac Arrhythmia  Assessment/Plan:  End stage priamry arthritis, right knee   The patient history, physical examination, clinical judgment of the provider and imaging studies are consistent with end stage  degenerative joint disease of the right knee(s) and total knee arthroplasty is deemed medically necessary. The treatment options including medical management, injection therapy arthroscopy and arthroplasty were discussed at length. The risks and benefits of total knee arthroplasty were presented and reviewed. The risks due to aseptic loosening, infection, stiffness, patella tracking problems, thromboembolic complications and other imponderables were discussed. The patient acknowledged the explanation, agreed to proceed with the plan and consent was signed. Patient is being admitted for inpatient treatment for surgery, pain control, PT, OT, prophylactic antibiotics, VTE prophylaxis, progressive ambulation and ADL's and discharge planning. The patient is planning to be discharged to skilled nursing facility

## 2017-08-21 ENCOUNTER — Inpatient Hospital Stay (HOSPITAL_COMMUNITY): Payer: Medicare Other | Admitting: Certified Registered Nurse Anesthetist

## 2017-08-21 ENCOUNTER — Inpatient Hospital Stay (HOSPITAL_COMMUNITY)
Admission: RE | Admit: 2017-08-21 | Discharge: 2017-08-24 | DRG: 470 | Disposition: A | Discharge status: Skilled Nursing Facility | Payer: Medicare Other | Source: Ambulatory Visit | Attending: Orthopaedic Surgery | Admitting: Orthopaedic Surgery

## 2017-08-21 ENCOUNTER — Encounter (HOSPITAL_COMMUNITY)
Admission: RE | Disposition: A | Discharge status: Skilled Nursing Facility | Payer: Self-pay | Source: Ambulatory Visit | Attending: Orthopaedic Surgery

## 2017-08-21 ENCOUNTER — Encounter (HOSPITAL_COMMUNITY): Payer: Self-pay | Admitting: Certified Registered Nurse Anesthetist

## 2017-08-21 ENCOUNTER — Inpatient Hospital Stay (HOSPITAL_COMMUNITY): Payer: Medicare Other | Admitting: Emergency Medicine

## 2017-08-21 DIAGNOSIS — Z7982 Long term (current) use of aspirin: Secondary | ICD-10-CM

## 2017-08-21 DIAGNOSIS — Z8249 Family history of ischemic heart disease and other diseases of the circulatory system: Secondary | ICD-10-CM | POA: Diagnosis not present

## 2017-08-21 DIAGNOSIS — K219 Gastro-esophageal reflux disease without esophagitis: Secondary | ICD-10-CM | POA: Diagnosis present

## 2017-08-21 DIAGNOSIS — Z882 Allergy status to sulfonamides status: Secondary | ICD-10-CM

## 2017-08-21 DIAGNOSIS — Z90711 Acquired absence of uterus with remaining cervical stump: Secondary | ICD-10-CM

## 2017-08-21 DIAGNOSIS — I2510 Atherosclerotic heart disease of native coronary artery without angina pectoris: Secondary | ICD-10-CM | POA: Diagnosis present

## 2017-08-21 DIAGNOSIS — E785 Hyperlipidemia, unspecified: Secondary | ICD-10-CM | POA: Diagnosis present

## 2017-08-21 DIAGNOSIS — Z881 Allergy status to other antibiotic agents status: Secondary | ICD-10-CM

## 2017-08-21 DIAGNOSIS — Z7901 Long term (current) use of anticoagulants: Secondary | ICD-10-CM

## 2017-08-21 DIAGNOSIS — Z801 Family history of malignant neoplasm of trachea, bronchus and lung: Secondary | ICD-10-CM | POA: Diagnosis not present

## 2017-08-21 DIAGNOSIS — I481 Persistent atrial fibrillation: Secondary | ICD-10-CM | POA: Diagnosis present

## 2017-08-21 DIAGNOSIS — G8929 Other chronic pain: Secondary | ICD-10-CM | POA: Diagnosis present

## 2017-08-21 DIAGNOSIS — Z96611 Presence of right artificial shoulder joint: Secondary | ICD-10-CM | POA: Diagnosis present

## 2017-08-21 DIAGNOSIS — I5042 Chronic combined systolic (congestive) and diastolic (congestive) heart failure: Secondary | ICD-10-CM | POA: Diagnosis present

## 2017-08-21 DIAGNOSIS — I110 Hypertensive heart disease with heart failure: Secondary | ICD-10-CM | POA: Diagnosis present

## 2017-08-21 DIAGNOSIS — E890 Postprocedural hypothyroidism: Secondary | ICD-10-CM | POA: Diagnosis present

## 2017-08-21 DIAGNOSIS — Z8774 Personal history of (corrected) congenital malformations of heart and circulatory system: Secondary | ICD-10-CM | POA: Diagnosis not present

## 2017-08-21 DIAGNOSIS — M797 Fibromyalgia: Secondary | ICD-10-CM | POA: Diagnosis present

## 2017-08-21 DIAGNOSIS — Z6832 Body mass index (BMI) 32.0-32.9, adult: Secondary | ICD-10-CM

## 2017-08-21 DIAGNOSIS — Z953 Presence of xenogenic heart valve: Secondary | ICD-10-CM

## 2017-08-21 DIAGNOSIS — I252 Old myocardial infarction: Secondary | ICD-10-CM | POA: Diagnosis not present

## 2017-08-21 DIAGNOSIS — Z7951 Long term (current) use of inhaled steroids: Secondary | ICD-10-CM

## 2017-08-21 DIAGNOSIS — H9190 Unspecified hearing loss, unspecified ear: Secondary | ICD-10-CM | POA: Diagnosis present

## 2017-08-21 DIAGNOSIS — I428 Other cardiomyopathies: Secondary | ICD-10-CM | POA: Diagnosis present

## 2017-08-21 DIAGNOSIS — K760 Fatty (change of) liver, not elsewhere classified: Secondary | ICD-10-CM | POA: Diagnosis present

## 2017-08-21 DIAGNOSIS — M1711 Unilateral primary osteoarthritis, right knee: Principal | ICD-10-CM | POA: Diagnosis present

## 2017-08-21 DIAGNOSIS — Z96612 Presence of left artificial shoulder joint: Secondary | ICD-10-CM | POA: Diagnosis present

## 2017-08-21 DIAGNOSIS — Z888 Allergy status to other drugs, medicaments and biological substances status: Secondary | ICD-10-CM

## 2017-08-21 DIAGNOSIS — Z9851 Tubal ligation status: Secondary | ICD-10-CM | POA: Diagnosis not present

## 2017-08-21 DIAGNOSIS — E669 Obesity, unspecified: Secondary | ICD-10-CM | POA: Diagnosis present

## 2017-08-21 DIAGNOSIS — Z79890 Hormone replacement therapy (postmenopausal): Secondary | ICD-10-CM

## 2017-08-21 DIAGNOSIS — Z886 Allergy status to analgesic agent status: Secondary | ICD-10-CM

## 2017-08-21 HISTORY — PX: TOTAL KNEE ARTHROPLASTY: SHX125

## 2017-08-21 SURGERY — ARTHROPLASTY, KNEE, TOTAL
Anesthesia: Spinal | Site: Knee | Laterality: Right

## 2017-08-21 MED ORDER — HYDROMORPHONE HCL 2 MG/ML IJ SOLN
0.5000 mg | INTRAMUSCULAR | Status: DC | PRN
  Administered 2017-08-21 (×2): 0.5 mg via INTRAVENOUS
  Administered 2017-08-22: 1 mg via INTRAVENOUS
  Filled 2017-08-21 (×4): qty 1

## 2017-08-21 MED ORDER — FENTANYL CITRATE (PF) 100 MCG/2ML IJ SOLN
25.0000 ug | INTRAMUSCULAR | Status: DC | PRN
  Administered 2017-08-21 (×2): 50 ug via INTRAVENOUS

## 2017-08-21 MED ORDER — 0.9 % SODIUM CHLORIDE (POUR BTL) OPTIME
TOPICAL | Status: DC | PRN
  Administered 2017-08-21: 1000 mL

## 2017-08-21 MED ORDER — DEXAMETHASONE SODIUM PHOSPHATE 10 MG/ML IJ SOLN
INTRAMUSCULAR | Status: DC | PRN
  Administered 2017-08-21: 10 mg via INTRAVENOUS

## 2017-08-21 MED ORDER — ATORVASTATIN CALCIUM 20 MG PO TABS
20.0000 mg | ORAL_TABLET | Freq: Every day | ORAL | Status: DC
  Administered 2017-08-21 – 2017-08-24 (×4): 20 mg via ORAL
  Filled 2017-08-21 (×4): qty 1

## 2017-08-21 MED ORDER — ONDANSETRON HCL 4 MG/2ML IJ SOLN
INTRAMUSCULAR | Status: AC
  Filled 2017-08-21: qty 4

## 2017-08-21 MED ORDER — BUPIVACAINE IN DEXTROSE 0.75-8.25 % IT SOLN
INTRATHECAL | Status: DC | PRN
  Administered 2017-08-21: 2 mL via INTRATHECAL

## 2017-08-21 MED ORDER — DOCUSATE SODIUM 100 MG PO CAPS
100.0000 mg | ORAL_CAPSULE | Freq: Two times a day (BID) | ORAL | Status: DC
  Administered 2017-08-21 – 2017-08-24 (×6): 100 mg via ORAL
  Filled 2017-08-21 (×6): qty 1

## 2017-08-21 MED ORDER — OXYCODONE HCL 5 MG PO TABS
10.0000 mg | ORAL_TABLET | ORAL | Status: DC | PRN
  Administered 2017-08-21 – 2017-08-24 (×14): 15 mg via ORAL
  Filled 2017-08-21 (×4): qty 3
  Filled 2017-08-21: qty 2
  Filled 2017-08-21 (×9): qty 3

## 2017-08-21 MED ORDER — ALUM & MAG HYDROXIDE-SIMETH 200-200-20 MG/5ML PO SUSP: 30.0000 mL | ORAL | Status: DC | PRN

## 2017-08-21 MED ORDER — CEFAZOLIN SODIUM-DEXTROSE 2-4 GM/100ML-% IV SOLN
2.0000 g | Freq: Four times a day (QID) | INTRAVENOUS | Status: AC
  Administered 2017-08-21 – 2017-08-22 (×2): 2 g via INTRAVENOUS
  Filled 2017-08-21 (×3): qty 100

## 2017-08-21 MED ORDER — OXYCODONE HCL 5 MG PO TABS: 5.0000 mg | ORAL_TABLET | Freq: Once | ORAL | Status: DC | PRN

## 2017-08-21 MED ORDER — PROPOFOL 500 MG/50ML IV EMUL
INTRAVENOUS | Status: DC | PRN
  Administered 2017-08-21: 75 ug/kg/min via INTRAVENOUS

## 2017-08-21 MED ORDER — MONTELUKAST SODIUM 10 MG PO TABS
10.0000 mg | ORAL_TABLET | Freq: Every day | ORAL | Status: DC
  Administered 2017-08-22 – 2017-08-24 (×3): 10 mg via ORAL
  Filled 2017-08-21 (×3): qty 1

## 2017-08-21 MED ORDER — FENTANYL CITRATE (PF) 100 MCG/2ML IJ SOLN
INTRAMUSCULAR | Status: AC
  Administered 2017-08-21: 50 ug via INTRAVENOUS
  Filled 2017-08-21: qty 2

## 2017-08-21 MED ORDER — LORAZEPAM 1 MG PO TABS
2.0000 mg | ORAL_TABLET | Freq: Every day | ORAL | Status: DC
  Administered 2017-08-21 – 2017-08-23 (×3): 2 mg via ORAL
  Filled 2017-08-21 (×3): qty 2

## 2017-08-21 MED ORDER — TRANEXAMIC ACID 1000 MG/10ML IV SOLN
1000.0000 mg | Freq: Once | INTRAVENOUS | Status: AC
  Administered 2017-08-22: 1000 mg via INTRAVENOUS
  Filled 2017-08-21 (×2): qty 10

## 2017-08-21 MED ORDER — LACTATED RINGERS IV SOLN
INTRAVENOUS | Status: DC
  Administered 2017-08-21: 17:00:00 via INTRAVENOUS
  Administered 2017-08-22: 50 mL/h via INTRAVENOUS

## 2017-08-21 MED ORDER — LORAZEPAM 1 MG PO TABS
1.0000 mg | ORAL_TABLET | Freq: Every day | ORAL | Status: DC | PRN
  Administered 2017-08-23: 1 mg via ORAL
  Filled 2017-08-21: qty 1

## 2017-08-21 MED ORDER — CARVEDILOL 12.5 MG PO TABS
12.5000 mg | ORAL_TABLET | Freq: Two times a day (BID) | ORAL | Status: DC
  Administered 2017-08-21 – 2017-08-24 (×6): 12.5 mg via ORAL
  Filled 2017-08-21 (×6): qty 1

## 2017-08-21 MED ORDER — ACETAMINOPHEN 325 MG PO TABS
325.0000 mg | ORAL_TABLET | Freq: Four times a day (QID) | ORAL | Status: DC | PRN
  Administered 2017-08-23 – 2017-08-24 (×2): 650 mg via ORAL
  Filled 2017-08-21 (×2): qty 2

## 2017-08-21 MED ORDER — METHOCARBAMOL 1000 MG/10ML IJ SOLN
500.0000 mg | Freq: Four times a day (QID) | INTRAVENOUS | Status: DC | PRN
  Filled 2017-08-21: qty 5

## 2017-08-21 MED ORDER — APIXABAN 5 MG PO TABS
5.0000 mg | ORAL_TABLET | Freq: Two times a day (BID) | ORAL | Status: DC
  Administered 2017-08-22 – 2017-08-24 (×5): 5 mg via ORAL
  Filled 2017-08-21 (×5): qty 1

## 2017-08-21 MED ORDER — PHENOL 1.4 % MT LIQD: 1.0000 | OROMUCOSAL | Status: DC | PRN

## 2017-08-21 MED ORDER — FUROSEMIDE 40 MG PO TABS
40.0000 mg | ORAL_TABLET | Freq: Two times a day (BID) | ORAL | Status: DC
  Administered 2017-08-21 – 2017-08-24 (×6): 40 mg via ORAL
  Filled 2017-08-21 (×6): qty 1

## 2017-08-21 MED ORDER — ONDANSETRON HCL 4 MG/2ML IJ SOLN: 4.0000 mg | Freq: Four times a day (QID) | INTRAMUSCULAR | Status: DC | PRN

## 2017-08-21 MED ORDER — CARVEDILOL 12.5 MG PO TABS
ORAL_TABLET | ORAL | Status: AC
  Administered 2017-08-21: 12.5 mg via ORAL
  Filled 2017-08-21: qty 1

## 2017-08-21 MED ORDER — DEXAMETHASONE SODIUM PHOSPHATE 10 MG/ML IJ SOLN
INTRAMUSCULAR | Status: AC
  Filled 2017-08-21: qty 3

## 2017-08-21 MED ORDER — BUPIVACAINE-EPINEPHRINE (PF) 0.5% -1:200000 IJ SOLN
INTRAMUSCULAR | Status: AC
  Filled 2017-08-21: qty 30

## 2017-08-21 MED ORDER — GABAPENTIN 600 MG PO TABS
600.0000 mg | ORAL_TABLET | Freq: Three times a day (TID) | ORAL | Status: DC
  Administered 2017-08-21 – 2017-08-24 (×9): 600 mg via ORAL
  Filled 2017-08-21 (×9): qty 1

## 2017-08-21 MED ORDER — LEVOTHYROXINE SODIUM 25 MCG PO TABS
137.0000 ug | ORAL_TABLET | Freq: Every day | ORAL | Status: DC
  Administered 2017-08-22 – 2017-08-24 (×3): 137 ug via ORAL
  Filled 2017-08-21 (×3): qty 1

## 2017-08-21 MED ORDER — METHOCARBAMOL 500 MG PO TABS
ORAL_TABLET | ORAL | Status: AC
  Filled 2017-08-21: qty 1

## 2017-08-21 MED ORDER — SODIUM CHLORIDE 0.9% FLUSH
INTRAVENOUS | Status: DC | PRN
  Administered 2017-08-21: 30 mL

## 2017-08-21 MED ORDER — METHOCARBAMOL 500 MG PO TABS
500.0000 mg | ORAL_TABLET | Freq: Four times a day (QID) | ORAL | Status: DC | PRN
  Administered 2017-08-21 – 2017-08-24 (×10): 500 mg via ORAL
  Filled 2017-08-21 (×10): qty 1

## 2017-08-21 MED ORDER — ACETAMINOPHEN 500 MG PO TABS
1000.0000 mg | ORAL_TABLET | Freq: Four times a day (QID) | ORAL | Status: AC
  Administered 2017-08-21 – 2017-08-22 (×4): 1000 mg via ORAL
  Filled 2017-08-21 (×4): qty 2

## 2017-08-21 MED ORDER — ASPIRIN EC 81 MG PO TBEC
81.0000 mg | DELAYED_RELEASE_TABLET | Freq: Every day | ORAL | Status: DC
  Administered 2017-08-22 – 2017-08-24 (×3): 81 mg via ORAL
  Filled 2017-08-21 (×3): qty 1

## 2017-08-21 MED ORDER — PANTOPRAZOLE SODIUM 40 MG PO TBEC
80.0000 mg | DELAYED_RELEASE_TABLET | Freq: Every day | ORAL | Status: DC
  Administered 2017-08-22 – 2017-08-24 (×3): 80 mg via ORAL
  Filled 2017-08-21 (×3): qty 2

## 2017-08-21 MED ORDER — CARVEDILOL 12.5 MG PO TABS
12.5000 mg | ORAL_TABLET | Freq: Once | ORAL | Status: AC
  Administered 2017-08-21: 12.5 mg via ORAL

## 2017-08-21 MED ORDER — LORATADINE 10 MG PO TABS
10.0000 mg | ORAL_TABLET | Freq: Every day | ORAL | Status: DC
  Administered 2017-08-21 – 2017-08-24 (×4): 10 mg via ORAL
  Filled 2017-08-21 (×4): qty 1

## 2017-08-21 MED ORDER — METOCLOPRAMIDE HCL 5 MG/ML IJ SOLN: 5.0000 mg | Freq: Three times a day (TID) | INTRAMUSCULAR | Status: DC | PRN

## 2017-08-21 MED ORDER — MIDAZOLAM HCL 5 MG/5ML IJ SOLN
INTRAMUSCULAR | Status: DC | PRN
  Administered 2017-08-21: 2 mg via INTRAVENOUS

## 2017-08-21 MED ORDER — FENTANYL CITRATE (PF) 100 MCG/2ML IJ SOLN
INTRAMUSCULAR | Status: AC
  Filled 2017-08-21: qty 2

## 2017-08-21 MED ORDER — OXYCODONE HCL 5 MG PO TABS
ORAL_TABLET | ORAL | Status: AC
  Filled 2017-08-21: qty 2

## 2017-08-21 MED ORDER — DIPHENHYDRAMINE HCL 12.5 MG/5ML PO ELIX
12.5000 mg | ORAL_SOLUTION | ORAL | Status: DC | PRN
  Administered 2017-08-22: 25 mg via ORAL
  Filled 2017-08-21: qty 10

## 2017-08-21 MED ORDER — METOCLOPRAMIDE HCL 5 MG PO TABS: 5.0000 mg | ORAL_TABLET | Freq: Three times a day (TID) | ORAL | Status: DC | PRN

## 2017-08-21 MED ORDER — MENTHOL 3 MG MT LOZG: 1.0000 | LOZENGE | OROMUCOSAL | Status: DC | PRN

## 2017-08-21 MED ORDER — PROPOFOL 10 MG/ML IV BOLUS
INTRAVENOUS | Status: AC
  Filled 2017-08-21: qty 20

## 2017-08-21 MED ORDER — ONDANSETRON HCL 4 MG PO TABS
4.0000 mg | ORAL_TABLET | Freq: Four times a day (QID) | ORAL | Status: DC | PRN
  Administered 2017-08-23 – 2017-08-24 (×3): 4 mg via ORAL
  Filled 2017-08-21 (×3): qty 1

## 2017-08-21 MED ORDER — PROPOFOL 10 MG/ML IV BOLUS
INTRAVENOUS | Status: DC | PRN
  Administered 2017-08-21: 20 mg via INTRAVENOUS
  Administered 2017-08-21: 50 mg via INTRAVENOUS
  Administered 2017-08-21: 20 mg via INTRAVENOUS

## 2017-08-21 MED ORDER — MIDAZOLAM HCL 2 MG/2ML IJ SOLN
INTRAMUSCULAR | Status: AC
  Filled 2017-08-21: qty 2

## 2017-08-21 MED ORDER — OXYCODONE HCL 5 MG PO TABS
5.0000 mg | ORAL_TABLET | ORAL | Status: DC | PRN
  Administered 2017-08-21 – 2017-08-22 (×2): 10 mg via ORAL
  Administered 2017-08-22: 5 mg via ORAL
  Filled 2017-08-21: qty 2
  Filled 2017-08-21: qty 1
  Filled 2017-08-21: qty 2

## 2017-08-21 MED ORDER — BUPIVACAINE-EPINEPHRINE (PF) 0.5% -1:200000 IJ SOLN
INTRAMUSCULAR | Status: DC | PRN
  Administered 2017-08-21: 30 mL

## 2017-08-21 MED ORDER — KETOROLAC TROMETHAMINE 15 MG/ML IJ SOLN
7.5000 mg | Freq: Four times a day (QID) | INTRAMUSCULAR | Status: AC
  Administered 2017-08-21 – 2017-08-22 (×3): 7.5 mg via INTRAVENOUS
  Filled 2017-08-21 (×4): qty 1

## 2017-08-21 MED ORDER — FENTANYL CITRATE (PF) 100 MCG/2ML IJ SOLN
INTRAMUSCULAR | Status: DC | PRN
  Administered 2017-08-21: 50 ug via INTRAVENOUS

## 2017-08-21 MED ORDER — FAMOTIDINE 20 MG PO TABS
20.0000 mg | ORAL_TABLET | Freq: Every day | ORAL | Status: DC
  Administered 2017-08-21 – 2017-08-24 (×4): 20 mg via ORAL
  Filled 2017-08-21 (×4): qty 1

## 2017-08-21 MED ORDER — BISACODYL 5 MG PO TBEC: 5.0000 mg | DELAYED_RELEASE_TABLET | Freq: Every day | ORAL | Status: DC | PRN

## 2017-08-21 MED ORDER — PHENYLEPHRINE 40 MCG/ML (10ML) SYRINGE FOR IV PUSH (FOR BLOOD PRESSURE SUPPORT)
PREFILLED_SYRINGE | INTRAVENOUS | Status: DC | PRN
  Administered 2017-08-21 (×2): 80 ug via INTRAVENOUS
  Administered 2017-08-21: 40 ug via INTRAVENOUS

## 2017-08-21 MED ORDER — PHENYLEPHRINE HCL 10 MG/ML IJ SOLN
INTRAVENOUS | Status: DC | PRN
  Administered 2017-08-21: 20 ug/min via INTRAVENOUS

## 2017-08-21 MED ORDER — SODIUM CHLORIDE 0.9 % IR SOLN
Status: DC | PRN
  Administered 2017-08-21: 3000 mL

## 2017-08-21 MED ORDER — ONDANSETRON HCL 4 MG/2ML IJ SOLN
INTRAMUSCULAR | Status: DC | PRN
  Administered 2017-08-21: 4 mg via INTRAVENOUS

## 2017-08-21 MED ORDER — POTASSIUM CHLORIDE CRYS ER 20 MEQ PO TBCR
20.0000 meq | EXTENDED_RELEASE_TABLET | Freq: Two times a day (BID) | ORAL | Status: DC
  Administered 2017-08-21 – 2017-08-24 (×6): 20 meq via ORAL
  Filled 2017-08-21 (×6): qty 1

## 2017-08-21 MED ORDER — FENTANYL CITRATE (PF) 250 MCG/5ML IJ SOLN
INTRAMUSCULAR | Status: AC
  Filled 2017-08-21: qty 5

## 2017-08-21 MED ORDER — LIDOCAINE 2% (20 MG/ML) 5 ML SYRINGE
INTRAMUSCULAR | Status: DC | PRN
  Administered 2017-08-21: 40 mg via INTRAVENOUS

## 2017-08-21 MED ORDER — OXYCODONE HCL 5 MG/5ML PO SOLN: 5.0000 mg | Freq: Once | ORAL | Status: DC | PRN

## 2017-08-21 MED ORDER — PROMETHAZINE HCL 25 MG PO TABS: 25.0000 mg | ORAL_TABLET | Freq: Three times a day (TID) | ORAL | Status: DC | PRN

## 2017-08-21 MED ORDER — BUPIVACAINE LIPOSOME 1.3 % IJ SUSP
20.0000 mL | INTRAMUSCULAR | Status: AC
  Administered 2017-08-21: 20 mL
  Filled 2017-08-21: qty 20

## 2017-08-21 MED ORDER — CHLORHEXIDINE GLUCONATE 4 % EX LIQD: 60.0000 mL | Freq: Once | CUTANEOUS | Status: DC

## 2017-08-21 MED ORDER — LIDOCAINE 2% (20 MG/ML) 5 ML SYRINGE
INTRAMUSCULAR | Status: AC
  Filled 2017-08-21: qty 15

## 2017-08-21 SURGICAL SUPPLY — 54 items
BAG DECANTER FOR FLEXI CONT (MISCELLANEOUS) ×2 IMPLANT
BANDAGE ESMARK 6X9 LF (GAUZE/BANDAGES/DRESSINGS) ×1 IMPLANT
BLADE SAGITTAL 25.0X1.19X90 (BLADE) ×1 IMPLANT
BLADE SAGITTAL 25.0X1.19X90MM (BLADE) ×1
BLADE SAW SGTL 13.0X1.19X90.0M (BLADE) ×2 IMPLANT
BNDG CMPR 9X6 STRL LF SNTH (GAUZE/BANDAGES/DRESSINGS) ×1
BNDG CMPR MED 10X6 ELC LF (GAUZE/BANDAGES/DRESSINGS) ×1
BNDG ELASTIC 6X10 VLCR STRL LF (GAUZE/BANDAGES/DRESSINGS) ×3 IMPLANT
BNDG ESMARK 6X9 LF (GAUZE/BANDAGES/DRESSINGS) ×3
BOWL SMART MIX CTS (DISPOSABLE) ×3 IMPLANT
CAP KNEE TOTAL 3 SIGMA ×2 IMPLANT
CEMENT HV SMART SET (Cement) ×6 IMPLANT
CLOSURE STERI-STRIP 1/4X4 (GAUZE/BANDAGES/DRESSINGS) ×2 IMPLANT
CLOSURE WOUND 1/2 X4 (GAUZE/BANDAGES/DRESSINGS) ×1
COVER SURGICAL LIGHT HANDLE (MISCELLANEOUS) ×3 IMPLANT
CUFF TOURNIQUET SINGLE 34IN LL (TOURNIQUET CUFF) ×3 IMPLANT
DECANTER SPIKE VIAL GLASS SM (MISCELLANEOUS) ×3 IMPLANT
DRAPE EXTREMITY T 121X128X90 (DRAPE) ×3 IMPLANT
DRAPE HALF SHEET 40X57 (DRAPES) ×6 IMPLANT
DRAPE U-SHAPE 47X51 STRL (DRAPES) ×3 IMPLANT
DRSG AQUACEL AG ADV 3.5X10 (GAUZE/BANDAGES/DRESSINGS) ×3 IMPLANT
DURAPREP 26ML APPLICATOR (WOUND CARE) ×5 IMPLANT
ELECT REM PT RETURN 9FT ADLT (ELECTROSURGICAL) ×3
ELECTRODE REM PT RTRN 9FT ADLT (ELECTROSURGICAL) ×1 IMPLANT
GLOVE BIO SURGEON STRL SZ8 (GLOVE) ×6 IMPLANT
GLOVE BIOGEL PI IND STRL 8 (GLOVE) ×2 IMPLANT
GLOVE BIOGEL PI INDICATOR 8 (GLOVE) ×4
GLOVE SURG SS PI 6.5 STRL IVOR (GLOVE) ×2 IMPLANT
GOWN STRL REUS W/ TWL LRG LVL3 (GOWN DISPOSABLE) ×1 IMPLANT
GOWN STRL REUS W/ TWL XL LVL3 (GOWN DISPOSABLE) ×2 IMPLANT
GOWN STRL REUS W/TWL LRG LVL3 (GOWN DISPOSABLE) ×9
GOWN STRL REUS W/TWL XL LVL3 (GOWN DISPOSABLE) ×6
HANDPIECE INTERPULSE COAX TIP (DISPOSABLE) ×3
HOOD PEEL AWAY FACE SHEILD DIS (HOOD) ×8 IMPLANT
IMMOBILIZER KNEE 22 UNIV (SOFTGOODS) ×3 IMPLANT
KIT BASIN OR (CUSTOM PROCEDURE TRAY) ×3 IMPLANT
KIT TURNOVER KIT B (KITS) ×3 IMPLANT
MANIFOLD NEPTUNE II (INSTRUMENTS) ×3 IMPLANT
NDL HYPO 21X1 ECLIPSE (NEEDLE) ×1 IMPLANT
NEEDLE HYPO 21X1 ECLIPSE (NEEDLE) ×3 IMPLANT
NS IRRIG 1000ML POUR BTL (IV SOLUTION) ×3 IMPLANT
PACK TOTAL JOINT (CUSTOM PROCEDURE TRAY) ×3 IMPLANT
PAD ARMBOARD 7.5X6 YLW CONV (MISCELLANEOUS) ×4 IMPLANT
SET HNDPC FAN SPRY TIP SCT (DISPOSABLE) ×1 IMPLANT
STRIP CLOSURE SKIN 1/2X4 (GAUZE/BANDAGES/DRESSINGS) ×2 IMPLANT
SUT VIC AB 0 CT1 27 (SUTURE) ×3
SUT VIC AB 0 CT1 27XBRD ANBCTR (SUTURE) ×1 IMPLANT
SUT VIC AB 2-0 CT1 27 (SUTURE) ×3
SUT VIC AB 2-0 CT1 TAPERPNT 27 (SUTURE) ×1 IMPLANT
SUT VIC AB 3-0 FS2 27 (SUTURE) ×3 IMPLANT
SUT VLOC 180 0 24IN GS25 (SUTURE) ×3 IMPLANT
SYR 50ML LL SCALE MARK (SYRINGE) ×3 IMPLANT
TOWEL OR 17X26 10 PK STRL BLUE (TOWEL DISPOSABLE) ×3 IMPLANT
TRAY CATH 16FR W/PLASTIC CATH (SET/KITS/TRAYS/PACK) ×2 IMPLANT

## 2017-08-21 NOTE — Op Note (Signed)
PREOP DIAGNOSIS: DJD RIGHT KNEE POSTOP DIAGNOSIS: same PROCEDURE: RIGHT TKR ANESTHESIA: Spinal and MAC ATTENDING SURGEON: Keigan Tafoya G ASSISTANT: Loni Dolly PA  INDICATIONS FOR PROCEDURE: Allison [name] is a 67 y.o. female who has struggled for a long time with pain due to degenerative arthritis of the right knee.  The patient has failed many conservative non-operative measures and at this point has pain which limits the ability to sleep and walk.  The patient is offered total knee replacement.  Informed operative consent was obtained after discussion of possible risks of anesthesia, infection, neurovascular injury, DVT, and death.  The importance of the post-operative rehabilitation protocol to optimize result was stressed extensively with the patient.  SUMMARY OF FINDINGS AND PROCEDURE:  Allison [name] was taken to the operative suite where under the above anesthesia a right knee replacement was performed.  There were advanced degenerative changes and the bone quality was good.  We used the DePuy LCS system and placed size standard plus femur, 4 MBT revision tibia, 35 mm all polyethylene patella, and a size 10 mm spacer.  Loni Dolly PA-C assisted throughout and was invaluable to the completion of the case in that he helped retract and maintain exposure while I placed components.  He also helped close thereby minimizing OR time.  The patient was admitted for appropriate post-op care to include perioperative antibiotics and mechanical and pharmacologic measures for DVT prophylaxis.  DESCRIPTION OF PROCEDURE:  Allison [name] was taken to the operative suite where the above anesthesia was applied.  The patient was positioned supine and prepped and draped in normal sterile fashion.  An appropriate time out was performed.  After the administration of kefzol pre-op antibiotic the leg was elevated and exsanguinated and a tourniquet inflated. A standard longitudinal incision was made on the anterior  knee.  Dissection was carried down to the extensor mechanism.  All appropriate anti-infective measures were used including the pre-operative antibiotic, betadine impregnated drape, and closed hooded exhaust systems for each member of the surgical team.  A medial parapatellar incision was made in the extensor mechanism and the knee cap flipped and the knee flexed.  Some residual meniscal tissues were removed along with any remaining ACL/PCL tissue.  A guide was placed on the tibia and a flat cut was made on it's superior surface.  An intramedullary guide was placed in the femur and was utilized to make anterior and posterior cuts creating an appropriate flexion gap.  A second intramedullary guide was placed in the femur to make a distal cut properly balancing the knee with an extension gap equal to the flexion gap.  The three bones sized to the above mentioned sizes and the appropriate guides were placed and utilized.  A trial reduction was done and the knee easily came to full extension and the patella tracked well on flexion.  The trial components were removed and all bones were cleaned with pulsatile lavage and then dried thoroughly.  Cement was mixed and was pressurized onto the bones followed by placement of the aforementioned components.  Excess cement was trimmed and pressure was held on the components until the cement had hardened.  The tourniquet was deflated and a small amount of bleeding was controlled with cautery and pressure.  The knee was irrigated thoroughly.  The extensor mechanism was re-approximated with V-loc suture in running fashion.  The knee was flexed and the repair was solid.  The subcutaneous tissues were re-approximated with #0 and #2-0 vicryl and the skin  closed with a subcuticular stitch and steristrips.  A sterile dressing was applied.  Intraoperative fluids, EBL, and tourniquet time can be obtained from anesthesia records.  DISPOSITION:  The patient was taken to recovery room in  stable condition and admitted for appropriate post-op care to include peri-operative antibiotic and DVT prophylaxis with mechanical and pharmacologic measures.  Samamtha Tiegs G 08/21/2017, 2:58 PM

## 2017-08-21 NOTE — Anesthesia Procedure Notes (Signed)
Procedure Name: MAC Date/Time: 08/21/2017 1:21 PM Performed by: Candis Shine, CRNA Pre-anesthesia Checklist: Patient identified, Emergency Drugs available, Suction available, Patient being monitored and Timeout performed Patient Re-evaluated:Patient Re-evaluated prior to induction Oxygen Delivery Method: Simple face mask Dental Injury: Teeth and Oropharynx as per pre-operative assessment

## 2017-08-21 NOTE — Interval H&P Note (Signed)
History and Physical Interval Note:  08/21/2017 12:09 PM  [name]  has presented today for surgery, with the diagnosis of RIGHT KNEE DEGENERATIVE JOINT DISEASE  The various methods of treatment have been discussed with the patient and family. After consideration of risks, benefits and other options for treatment, the patient has consented to  Procedure(s): RIGHT TOTAL KNEE ARTHROPLASTY (Right) as a surgical intervention .  The patient's history has been reviewed, patient examined, no change in status, stable for surgery.  I have reviewed the patient's chart and labs.  Questions were answered to the patient's satisfaction.     Marcanthony Sleight G

## 2017-08-21 NOTE — Transfer of Care (Signed)
Immediate Anesthesia Transfer of Care Note  Patient: Allison [name]  Procedure(s) Performed: RIGHT TOTAL KNEE ARTHROPLASTY (Right Knee)  Patient Location: PACU  Anesthesia Type:Spinal  Level of Consciousness: awake  Airway & Oxygen Therapy: Patient Spontanous Breathing  Post-op Assessment: Report given to RN and Post -op Vital signs reviewed and stable  Post vital signs: Reviewed and stable  Last Vitals:  Vitals Value Taken Time  BP 93/54 08/21/2017  3:46 PM  Temp 36.6 C 08/21/2017  3:46 PM  Pulse 76 08/21/2017  3:53 PM  Resp 17 08/21/2017  3:53 PM  SpO2 94 % 08/21/2017  3:53 PM  Vitals shown include unvalidated device data.  Last Pain:  Vitals:   08/21/17 1546  TempSrc:   PainSc: 0-No pain      Patients Stated Pain Goal: 2 (08/21/17 1145)  Complications: No apparent anesthesia complications

## 2017-08-21 NOTE — Plan of Care (Signed)
  Problem: Nutrition: Goal: Adequate nutrition will be maintained Outcome: Progressing   Problem: Elimination: Goal: Will not experience complications related to bowel motility Outcome: Progressing   Problem: Pain Managment: Goal: General experience of comfort will improve Outcome: Progressing   Problem: Safety: Goal: Ability to remain free from injury will improve Outcome: Progressing   

## 2017-08-21 NOTE — Anesthesia Postprocedure Evaluation (Signed)
Anesthesia Post Note  Patient: Allison [name]  Procedure(s) Performed: RIGHT TOTAL KNEE ARTHROPLASTY (Right Knee)     Patient location during evaluation: PACU Anesthesia Type: Spinal Level of consciousness: oriented and awake and alert Pain management: pain level controlled Vital Signs Assessment: post-procedure vital signs reviewed and stable Respiratory status: spontaneous breathing, respiratory function stable and patient connected to nasal cannula oxygen Cardiovascular status: blood pressure returned to baseline and stable Postop Assessment: no headache, no backache and no apparent nausea or vomiting Anesthetic complications: no    Last Vitals:  Vitals:   08/21/17 1645 08/21/17 1710  BP:  106/61  Pulse:  81  Resp:  16  Temp: 36.6 C 36.6 C  SpO2:  91%    Last Pain:  Vitals:   08/21/17 1918  TempSrc:   PainSc: 8                  Bruno Leach COKER

## 2017-08-21 NOTE — Anesthesia Procedure Notes (Signed)
Spinal  Patient location during procedure: OR Start time: 08/21/2017 1:19 PM End time: 08/21/2017 1:23 PM Staffing Anesthesiologist: Val Eagle, MD Preanesthetic Checklist Completed: patient identified, surgical consent, pre-op evaluation, timeout performed, IV checked, risks and benefits discussed and monitors and equipment checked Spinal Block Patient position: sitting Prep: ChloraPrep and site prepped and draped Patient monitoring: heart rate, cardiac monitor, continuous pulse ox and blood pressure Approach: midline Location: L4-5 Injection technique: single-shot Needle Needle type: Pencan  Needle gauge: 24 G Needle length: 10 cm Assessment Sensory level: T4

## 2017-08-21 NOTE — Anesthesia Procedure Notes (Signed)
Anesthesia Regional Block: Adductor canal block   Pre-Anesthetic Checklist: ,, timeout performed, Correct Patient, Correct Site, Correct Laterality, Correct Procedure, Correct Position, site marked, Risks and benefits discussed, pre-op evaluation,  At surgeon's request and post-op pain management  Laterality: Right  Prep: Maximum Sterile Barrier Precautions used, chloraprep       Needles:  Injection technique: Single-shot  Needle Type: Echogenic Stimulator Needle     Needle Length: 9cm  Needle Gauge: 21     Additional Needles:   Procedures:,,,, ultrasound used (permanent image in chart),,,,  Narrative:  Start time: 08/21/2017 3:25 PM End time: 08/21/2017 3:30 PM Injection made incrementally with aspirations every 5 mL.  Performed by: Personally  Anesthesiologist: Kipp Brood, MD  Additional Notes: 20 cc 0.75% Ropivacaine with 0.2 mg clonidine injected easily

## 2017-08-21 NOTE — Anesthesia Preprocedure Evaluation (Signed)
Anesthesia Evaluation  Patient identified by MRN, date of birth, ID band Patient awake    Reviewed: Allergy & Precautions, NPO status , Patient's Chart, lab work & pertinent test results, reviewed documented beta blocker date and time   History of Anesthesia Complications Negative for: history of anesthetic complications  Airway Mallampati: II  TM Distance: >3 FB Neck ROM: Full    Dental  (+) Teeth Intact   Pulmonary shortness of breath,    breath sounds clear to auscultation       Cardiovascular hypertension, Pt. on medications and Pt. 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  [facility] includes: - Mitral regurgitation, aortic insufficiency,tricuspid regurgitation, PFO, persistentafib(s/p aortic valve replacement, mitral valve repair, tricuspid valve repair, closure of PFO, Maze procedure 04/13/16).  -Dilated nonischemic cardiomyopathy, chronic combined systolic and diastolic CHF, HTN, hypothyroidism, asthma, fatty liver, GERD. -Never smoker. BMI 33. -S/pR shoulder arthroplasty 07/02/12.S/p L shoulder arthroplasty 11/16/16    Reproductive/Obstetrics                              Anesthesia Physical Anesthesia Plan  ASA: III  Anesthesia Plan: MAC, Spinal and Regional   Post-op Pain Management:  Regional for Post-op pain   Induction:   PONV Risk Score and Plan: 2 and Treatment may vary due to age or medical condition  Airway Management Planned: Nasal Cannula  Additional Equipment: None  Intra-op Plan:   Post-operative Plan:   Informed Consent: I have reviewed the patients History and Physical, chart, labs and discussed the procedure including the risks, benefits and alternatives for the proposed anesthesia with the patient or authorized representative who has indicated his/her understanding and acceptance.   Dental advisory given  Plan Discussed with: Surgeon and CRNA  Anesthesia Plan Comments:         Anesthesia Quick Evaluation

## 2017-08-21 NOTE — Evaluation (Signed)
Physical Therapy Evaluation Patient Details Name: Allison [name] MRN: [id number] DOB: 07-05-1950 Today's Date: 08/21/2017   History of Present Illness  Pt is a 67 y/o female s/p elective R TKA. PMH includes deafness, a fib, non ischemic cardiomyopathy, HTN, CHF, fibromyalgia, bilat TSA, and s/p MVR.   Clinical Impression  Pt is s/p surgery above with deficits below. Sign language interpreter used throughout session. Pt limited by pain this session and anxious about mobility. Educated about importance of early mobility. Max A required to stand, however, able to perform stand pivot to chair with min A and RW. Per pt, she is unsure of d/c plan, as she states husband cannot physically assist. Feel safest d/c location at this time would be SNF, however, will continue to follow and update recommendations according to pt progress.     Follow Up Recommendations Follow surgeon's recommendation for DC plan and follow-up therapies;Supervision for mobility/OOB    Equipment Recommendations  3in1 (PT)    Recommendations for Other Services OT consult     Precautions / Restrictions Precautions Precautions: Knee;Fall Precaution Booklet Issued: No Precaution Comments: Reviewed knee precautions with pt.  Restrictions Weight Bearing Restrictions: Yes RLE Weight Bearing: Weight bearing as tolerated      Mobility  Bed Mobility Overal bed mobility: Needs Assistance Bed Mobility: Supine to Sit     Supine to sit: Min assist     General bed mobility comments: Min A for RLE management and trunk elevation.   Transfers Overall transfer level: Needs assistance Equipment used: Rolling walker (2 wheeled) Transfers: Sit to/from [employer] Sit to Stand: Max assist;From elevated surface Stand pivot transfers: Min assist       General transfer comment: Pt anxious about standing. Ensured it was ok to stand on knee. Required max A for lift assist and steadying using RW and KI. Verbal  cues for sequencing. Min A for steadying to perform stand pivot to chair. Further mobility limited secondary to pain.   Ambulation/Gait             General Gait Details: NT  Stairs            Wheelchair Mobility    Modified Rankin (Stroke Patients Only)       Balance Overall balance assessment: Needs assistance Sitting-balance support: No upper extremity supported;Feet supported Sitting balance-Leahy Scale: Fair     Standing balance support: Bilateral upper extremity supported;During functional activity Standing balance-Leahy Scale: Poor Standing balance comment: Reliant on BUE support.                              Pertinent Vitals/Pain Pain Assessment: Faces Faces Pain Scale: Hurts even more Pain Location: R knee  Pain Descriptors / Indicators: Aching;Operative site guarding Pain Intervention(s): Limited activity within patient's tolerance;Monitored during session;Repositioned    Home Living Family/patient expects to be discharged to:: Unsure Living Arrangements: Spouse/significant other Available Help at Discharge: Family Type of Home: House Home Access: Stairs to enter Entrance Stairs-Rails: None Entrance Stairs-Number of Steps: 3 Home Layout: One level Home Equipment: Cane - single point;Walker - 2 wheels      Prior Function Level of Independence: Independent with assistive device(s)         Comments: Reports occasional use of cane. Reports difficulty getting into and out of tub.      Hand Dominance   Dominant Hand: Right    Extremity/Trunk Assessment   Upper Extremity Assessment Upper Extremity Assessment:  Defer to OT evaluation    Lower Extremity Assessment Lower Extremity Assessment: RLE deficits/detail RLE Deficits / Details: Sensory in tact. Deficits consistent with post op pain and weakness.     Cervical / Trunk Assessment Cervical / Trunk Assessment: Normal  Communication   Communication: Deaf;Interpreter  utilized  Cognition Arousal/Alertness: Awake/alert Behavior During Therapy: WFL for tasks assessed/performed Overall Cognitive Status: Within Functional Limits for tasks assessed                                        General Comments General comments (skin integrity, edema, etc.): Pt's husband present during session. Interpreter used throughout session. Pt's daughter coming at end of session. In depth explanation about role of therapy throughout acute stay.     Exercises Total Joint Exercises Ankle Circles/Pumps: AROM;Both;20 reps   Assessment/Plan    PT Assessment Patient needs continued PT services  PT Problem List Decreased strength;Decreased balance;Decreased activity tolerance;Decreased mobility;Decreased knowledge of use of DME;Decreased knowledge of precautions;Pain       PT Treatment Interventions DME instruction;Gait training;Stair training;Functional mobility training;Therapeutic activities;Therapeutic exercise;Balance training;Patient/family education    PT Goals (Current goals can be found in the Care Plan section)  Acute Rehab PT Goals Patient Stated Goal: to get better  PT Goal Formulation: With patient/family Time For Goal Achievement: 09/04/17 Potential to Achieve Goals: Good    Frequency 7X/week   Barriers to discharge Other (comment) Pt's husband cannot physically assist.     Co-evaluation               AM-PAC PT "6 Clicks" Daily Activity  Outcome Measure Difficulty turning over in bed (including adjusting bedclothes, sheets and blankets)?: A Little Difficulty moving from lying on back to sitting on the side of the bed? : Unable Difficulty sitting down on and standing up from a chair with arms (e.g., wheelchair, bedside commode, etc,.)?: Unable Help needed moving to and from a bed to chair (including a wheelchair)?: A Little Help needed walking in hospital room?: A Lot Help needed climbing 3-5 steps with a railing? : A Lot 6 Click  Score: 12    End of Session Equipment Utilized During Treatment: Gait belt;Right knee immobilizer Activity Tolerance: Patient limited by pain Patient left: in chair;with call bell/phone within reach;with family/visitor present Nurse Communication: Mobility status PT Visit Diagnosis: Unsteadiness on feet (R26.81);Muscle weakness (generalized) (M62.81);Pain Pain - Right/Left: Right Pain - part of body: Knee    Time: 1705-1801 PT Time Calculation (min) (ACUTE ONLY): 56 min   Charges:   PT Evaluation $PT Eval Moderate Complexity: 1 Mod PT Treatments $Therapeutic Activity: 38-52 mins   PT G Codes:        Gladys Damme, PT, DPT  Acute Rehabilitation Services  Pager: [phone]   Lehman Prom 08/21/2017, 7:05 PM

## 2017-08-22 ENCOUNTER — Encounter (HOSPITAL_COMMUNITY): Payer: Self-pay | Admitting: Orthopaedic Surgery

## 2017-08-22 NOTE — Progress Notes (Signed)
Lost IV access, [place], [place] notified. Pt agreed PO meds for pain control.

## 2017-08-22 NOTE — Plan of Care (Signed)
  Problem: Nutrition: Goal: Adequate nutrition will be maintained Outcome: Progressing   Problem: Elimination: Goal: Will not experience complications related to bowel motility Outcome: Progressing   Problem: Pain Managment: Goal: General experience of comfort will improve Outcome: Progressing   Problem: Safety: Goal: Ability to remain free from injury will improve Outcome: Progressing   

## 2017-08-22 NOTE — Evaluation (Signed)
Occupational Therapy Evaluation Patient Details Name: Allison [name] MRN: [id number] DOB: 07-25-1950 Today's Date: 08/22/2017    History of Present Illness Pt is a 67 y/o female s/p elective R TKA. PMH includes deafness, a fib, non ischemic cardiomyopathy, HTN, CHF, fibromyalgia, bilat TSA, and s/p MVR.    Clinical Impression   Patient is s/p R TKA surgery resulting in functional limitations due to the deficits listed below (see OT problem list). Pt requires max (A) for LB and extensive education for all instructions.  Patient will benefit from skilled OT acutely to increase independence and safety with ADLS to allow discharge SNF.     Follow Up Recommendations  SNF    Equipment Recommendations  None recommended by OT    Recommendations for Other Services       Precautions / Restrictions Precautions Precautions: Knee;Fall Precaution Booklet Issued: No Precaution Comments: Reviewed knee precautions extensively and requires hand written instructions in addition to the intrepreter Restrictions Weight Bearing Restrictions: Yes RLE Weight Bearing: Weight bearing as tolerated      Mobility Bed Mobility Overal bed mobility: Needs Assistance Bed Mobility: Supine to Sit     Supine to sit: Min assist     General bed mobility comments: pt needs mod cues for sequence  Transfers Overall transfer level: Needs assistance Equipment used: Rolling walker (2 wheeled) Transfers: Sit to/from [employer] Sit to Stand: Min assist Stand pivot transfers: Mod assist       General transfer comment: pt provided visual of how to sequence task. pt asking multiple questions even with visual demo. Pt able to complete task but very anxious     Balance Overall balance assessment: Needs assistance Sitting-balance support: No upper extremity supported;Feet supported Sitting balance-Leahy Scale: Fair     Standing balance support: Bilateral upper extremity supported;During  functional activity Standing balance-Leahy Scale: Poor Standing balance comment: Reliant on BUE support.                            ADL either performed or assessed with clinical judgement   ADL Overall ADL's : Needs assistance/impaired Eating/Feeding: Set up   Grooming: Set up Grooming Details (indicate cue type and reason): oral care provided during sesion Upper Body Bathing: Minimal assistance   Lower Body Bathing: Maximal assistance   Upper Body Dressing : Minimal assistance   Lower Body Dressing: Maximal assistance   Toilet Transfer: Minimal assistance Toilet Transfer Details (indicate cue type and reason): pt requires extensive instructions on sequence for safety. simulated eobv to chair            General ADL Comments: pt educated on knee extension and that PT would address the exercises. pt required OT vs PT explained multiple times during session. pt unable states "i dont understand why i keep it straight if i need to move it"OT educating extensively on knee extension in sitting and need to complete exercises as PT recommends for knee flexion recovery.      Vision Baseline Vision/History: Wears glasses Wears Glasses: Reading only       Perception     Praxis      Pertinent Vitals/Pain Faces Pain Scale: Hurts even more Pain Location: R knee  Pain Descriptors / Indicators: Aching;Operative site guarding;Throbbing     Hand Dominance Right   Extremity/Trunk Assessment Upper Extremity Assessment Upper Extremity Assessment: RUE deficits/detail RUE Deficits / Details: RN called due to IV infiltrated at the forearm. IV stopped and  clamped. RN removing the IV during session with new dressing placed. OT elevating R UE on pillows   Lower Extremity Assessment Lower Extremity Assessment: Defer to PT evaluation   Cervical / Trunk Assessment Cervical / Trunk Assessment: Normal   Communication Communication Communication: Deaf;Interpreter utilized    Cognition Arousal/Alertness: Awake/alert Behavior During Therapy: Anxious Overall Cognitive Status: Impaired/Different from baseline Area of Impairment: Memory                     Memory: Decreased short-term memory             General Comments       Exercises     Shoulder Instructions      Home Living Family/patient expects to be discharged to:: Unsure Living Arrangements: Spouse/significant other Available Help at Discharge: Family Type of Home: House Home Access: Stairs to enter Secretary/administrator of Steps: 3 Entrance Stairs-Rails: None Home Layout: One level     Bathroom Shower/Tub: Tub/shower unit;Walk-in shower   Bathroom Toilet: Standard     Home Equipment: Cane - single point;Walker - 2 wheels;Shower seat;Grab bars - tub/shower;Hand held shower head   Additional Comments: interpreter used to make sure tha tinformation was correct Mariana Kaufman [id number]      Prior Functioning/Environment Level of Independence: Independent with assistive device(s)        Comments: Reports occasional use of cane. Reports difficulty getting into and out of tub.         OT Problem List: Decreased activity tolerance;Decreased cognition;Decreased knowledge of precautions;Decreased knowledge of use of DME or AE;Decreased safety awareness;Decreased strength;Decreased range of motion;Impaired balance (sitting and/or standing);Increased edema;Pain      OT Treatment/Interventions: Therapeutic activities;Therapeutic exercise;Self-care/ADL training;Energy conservation;DME and/or AE instruction;Patient/family education;Balance training;Cognitive remediation/compensation    OT Goals(Current goals can be found in the care plan section) Acute Rehab OT Goals Patient Stated Goal: to get better  OT Goal Formulation: With patient Time For Goal Achievement: 09/05/17 Potential to Achieve Goals: Good  OT Frequency: Min 2X/week   Barriers to D/C:            Co-evaluation               AM-PAC PT "6 Clicks" Daily Activity     Outcome Measure Help from another person eating meals?: None Help from another person taking care of personal grooming?: A Little Help from another person toileting, which includes using toliet, bedpan, or urinal?: A Little Help from another person bathing (including washing, rinsing, drying)?: A Lot Help from another person to put on and taking off regular upper body clothing?: A Little Help from another person to put on and taking off regular lower body clothing?: A Lot 6 Click Score: 17   End of Session Equipment Utilized During Treatment: Gait belt;Right knee immobilizer Nurse Communication: Mobility status;Precautions  Activity Tolerance: Patient tolerated treatment well Patient left: in chair;with call bell/phone within reach  OT Visit Diagnosis: Unsteadiness on feet (R26.81)                Time: 4782-9562 OT Time Calculation (min): 74 min Charges:  OT General Charges $OT Visit: 1 Visit OT Evaluation $OT Eval Moderate Complexity: 1 Mod OT Treatments $Self Care/Home Management : 53-67 mins G-Codes:      Mateo Flow   OTR/L Pager: [phone] Office: [phone] .   Boone Master B 08/22/2017, 3:32 PM

## 2017-08-22 NOTE — Discharge Instructions (Signed)

## 2017-08-22 NOTE — Progress Notes (Addendum)
Subjective: 1 Day Post-Op Procedure(s) (LRB): RIGHT TOTAL KNEE ARTHROPLASTY (Right)   Patient in no pain now but is nervous about getting up with PT.  Activity level:  wbat Diet tolerance:  ok Voiding:  ok Patient reports pain as mild.    Objective: Vital signs in last 24 hours: Temp:  [97.5 F (36.4 C)-98 F (36.7 C)] 97.9 F (36.6 C) (04/24 0422) Pulse Rate:  [75-87] 80 (04/24 0422) Resp:  [10-20] 16 (04/24 0422) BP: (80-132)/(54-69) 110/60 (04/24 0422) SpO2:  [91 %-100 %] 98 % (04/24 0422) Weight:  [93.9 kg (207 lb 1.6 oz)] 93.9 kg (207 lb 1.6 oz) (04/23 1145)  Labs: No results for input(s): HGB in the last 72 hours. No results for input(s): WBC, RBC, HCT, PLT in the last 72 hours. No results for input(s): NA, K, CL, CO2, BUN, CREATININE, GLUCOSE, CALCIUM in the last 72 hours. No results for input(s): LABPT, INR in the last 72 hours.  Physical Exam:  Neurologically intact ABD soft Neurovascular intact Sensation intact distally Intact pulses distally Dorsiflexion/Plantar flexion intact Incision: dressing C/D/I and no drainage No cellulitis present Compartment soft  Assessment/Plan:  1 Day Post-Op Procedure(s) (LRB): RIGHT TOTAL KNEE ARTHROPLASTY (Right) Advance diet Up with therapy D/C IV fluids Plan for discharge tomorrow Discharge to SNF If doing well and cleared by PT. Continue on home Eliquis and ASA. Follow up in office 2 weeks post op I reassured patient that she is ok to get up and move her knee with PT.  Anticipated LOS equal to or greater than 2 midnights due to - Age 72 and older with one or more of the following:  - Obesity  - Expected need for hospital services (PT, OT, Nursing) required for safe  discharge  - Anticipated need for postoperative skilled nursing care or inpatient rehab  - Active co-morbidities: Heart Failure and Cardiac Arrhythmia OR   - Unanticipated findings during/Post Surgery: Slow post-op progression: GI, pain control,  mobility  - Patient is a high risk of re-admission due to: Barriers to post-acute care (logistical, no family support in home)    Allison [name], Ginger Organ 08/22/2017, 8:18 AM

## 2017-08-22 NOTE — Clinical Social Work Note (Signed)
Clinical Social Work Assessment  Patient Details  Name: Allison [name] MRN: [id number] Date of Birth: 02-01-1951  Date of referral:  08/22/17               Reason for consult:  Facility Placement                Permission sought to share information with:  Facility Art therapist granted to share information::  No  Name::     spouse  Agency::  SNF  Relationship::     Contact Information:     Housing/Transportation Living arrangements for the past 2 months:  Single Family Home Source of Information:  Patient Patient Interpreter Needed:  None Criminal Activity/Legal Involvement Pertinent to Current Situation/Hospitalization:  No - Comment as needed Significant Relationships:  Adult Children, Other Family Members Lives with:  Spouse Do you feel safe going back to the place where you live?  No Need for family participation in patient care:  Yes (Comment)  Care giving concerns:  Pt resides at home with spouse, given new impairment will need short term rehab.   Social Worker assessment / plan:  CSW met with patient at bedside with the assistance of the [place] interpreter. CSW discussed the SNF process and placement. Pt has experience with SNF and has been to [facility] in the past. Pt would like a private room if possible and private bathroom. CSW explained that the private bathroom may be a challenge. Pt understands. CSW obtained permission to send to SNF's near home including [facility]. CSW explained the [facility] process. Pt indicated that her husband can transport to SNF.   CSW will f/u for disposition.  Employment status:  Retired Nurse, adult PT Recommendations:  [place] / Referral to community resources:  [place]  Patient/Family's Response to care:  Patient thanked CSW for meeting to discuss SNF placement and patient agreeable to SNF.  Patient/Family's Understanding of and  Emotional Response to Diagnosis, Current Treatment, and Prognosis:  Pt has good understanding of impairment and agreeable to SNF. Pt has been to SNF in the past and willing to go for rehabilitative therapies and then return home thereafter. Pt indicated that she was independent prior to hospitalization as she took care of her husband. CSW will f/u for disposition.  Emotional Assessment Appearance:  Appears stated age Attitude/Demeanor/Rapport:  (Cooperative) Affect (typically observed):  Accepting, Appropriate Orientation:  Oriented to Situation, Oriented to  Time, Oriented to Place, Oriented to Self Alcohol / Substance use:  Not Applicable Psych involvement (Current and /or in the community):  No (Comment)  Discharge Needs  Concerns to be addressed:  Discharge Planning Concerns Readmission within the last 30 days:  No Current discharge risk:  Physical Impairment Barriers to Discharge:  No Barriers Identified   Normajean Baxter, LCSW 08/22/2017, 12:10 PM

## 2017-08-22 NOTE — [place] FL2 (Signed)
[place] MEDICAID FL2 LEVEL OF CARE SCREENING TOOL     IDENTIFICATION  Patient Name: Allison [name] Birthdate: May 04, 1950 Sex: female Admission Date (Current Location): 08/21/2017  [facility] and [place] Number:  [employer] and Address:  [facility], [address] [place], [place] [address]      Provider Number: [id number]  Attending Physician Name and Address:  Marcene Corning, MD  Relative Name and Phone Number:  spouse, [phone]     Current Level of Care: Hospital Recommended Level of Care: Skilled Nursing Facility Prior Approval Number:    Date Approved/Denied:   PASRR Number: [id number]  Discharge Plan: SNF    Current Diagnoses: Patient Active Problem List   Diagnosis Date Noted  . Primary osteoarthritis of right knee 08/21/2017  . Spider veins of both lower extremities 01/16/2017  . S/P shoulder replacement, left 11/16/2016  . Encounter for therapeutic drug monitoring 05/12/2016  . LBBB (left bundle branch block) 05/09/2016  . 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 S/P aortic valve replacement with bioprosthetic valve 04/13/2016  . S/P tricuspid valve repair 04/13/2016  . S/P Maze operation for atrial fibrillation 04/13/2016  . Tricuspid regurgitation   . Mitral regurgitation   . Deafness   . Aortic insufficiency   . Chronic combined systolic and diastolic CHF (congestive heart failure) (HCC)   . Non-ischemic cardiomyopathy (HCC)   . Anticoagulated 01/20/2016  . Persistent atrial fibrillation (HCC)   . Hypothyroidism 09/17/2014  . Dyspnea 11/19/2013  . Edema 10/01/2013  . Dysphagia 08/28/2013  . Arthritis of right shoulder region 07/03/2012    Orientation RESPIRATION BLADDER Height & Weight     Self, Time, Situation, Place  Normal Incontinent Weight: 207 lb 1.6 oz (93.9 kg) Height:  5' 6.5" (168.9 cm)  BEHAVIORAL SYMPTOMS/MOOD NEUROLOGICAL  BOWEL NUTRITION STATUS      Continent Diet  AMBULATORY STATUS COMMUNICATION OF NEEDS Skin   Limited Assist Verbally Surgical wounds                       Personal Care Assistance Level of Assistance  Bathing, Feeding, Dressing Bathing Assistance: Limited assistance Feeding assistance: Limited assistance Dressing Assistance: Limited assistance     Functional Limitations Info  Sight, Hearing, Speech Sight Info: Adequate Hearing Info: Impaired Speech Info: Adequate    SPECIAL CARE FACTORS FREQUENCY  PT (By licensed PT), OT (By licensed OT)     PT Frequency: 7x week OT Frequency: 7x week            Contractures      Additional Factors Info  Code Status, Allergies, Psychotropic Code Status Info: Full Allergies Info: BIAXIN CLARITHROMYCIN, TRAZODONE AND NEFAZODONE, SULFA ANTIBIOTICS, ALEVE NAPROXEN, BEXTRA VALDECOXIB, DOXYCYCLINE, DURABAC APAP-SALICYL-PHENYLTOLOX-CAFF, ESTRADIOL, MACROBID NITROFURANTOIN, MOTRIN IBUPROFEN, OXAPROZIN, ROBAXIN METHOCARBAMOL, TOPAMAX TOPIRAMATE  Psychotropic Info: Ativan         Current Medications (08/22/2017):  This is the current hospital active medication list Current Facility-Administered Medications  Medication Dose Route Frequency Provider Last Rate Last Dose  . acetaminophen (TYLENOL) tablet 325-650 mg  325-650 mg Oral Q6H PRN Elodia Florence, PA-C      . alum & mag hydroxide-simeth (MAALOX/MYLANTA) 200-200-20 MG/5ML suspension 30 mL  30 mL Oral Q4H PRN Elodia Florence, PA-C      . apixaban (ELIQUIS) tablet 5 mg  5 mg Oral BID Elodia Florence, PA-C   5 mg at 08/22/17 0943  .  aspirin EC tablet 81 mg  81 mg Oral Daily Elodia Florence, PA-C   81 mg at 08/22/17 0943  . atorvastatin (LIPITOR) tablet 20 mg  20 mg Oral Daily Elodia Florence, PA-C   20 mg at 08/22/17 0943  . bisacodyl (DULCOLAX) EC tablet 5 mg  5 mg Oral Daily PRN Elodia Florence, PA-C      . carvedilol (COREG) tablet 12.5 mg  12.5 mg Oral BID WC Elodia Florence, PA-C   12.5 mg at 08/22/17  0943  . diphenhydrAMINE (BENADRYL) 12.5 MG/5ML elixir 12.5-25 mg  12.5-25 mg Oral Q4H PRN Elodia Florence, PA-C      . docusate sodium (COLACE) capsule 100 mg  100 mg Oral BID Elodia Florence, PA-C   100 mg at 08/22/17 0943  . famotidine (PEPCID) tablet 20 mg  20 mg Oral Daily Elodia Florence, PA-C   20 mg at 08/22/17 0943  . furosemide (LASIX) tablet 40 mg  40 mg Oral BID Elodia Florence, PA-C   40 mg at 08/22/17 0943  . gabapentin (NEURONTIN) tablet 600 mg  600 mg Oral TID Elodia Florence, PA-C   600 mg at 08/22/17 0943  . HYDROmorphone (DILAUDID) injection 0.5-1 mg  0.5-1 mg Intravenous Q4H PRN Elodia Florence, PA-C   1 mg at 08/22/17 0301  . lactated ringers infusion   Intravenous Continuous Elodia Florence, PA-C 50 mL/hr at 08/22/17 0257 50 mL/hr at 08/22/17 0257  . levothyroxine (SYNTHROID, LEVOTHROID) tablet 137 mcg  137 mcg Oral QAC breakfast Elodia Florence, PA-C   137 mcg at 08/22/17 1610  . loratadine (CLARITIN) tablet 10 mg  10 mg Oral Daily Elodia Florence, PA-C   10 mg at 08/22/17 0943  . LORazepam (ATIVAN) tablet 1 mg  1 mg Oral Daily PRN Marcene Corning, MD      . LORazepam (ATIVAN) tablet 2 mg  2 mg Oral QHS Elodia Florence, PA-C   2 mg at 08/21/17 2300  . menthol-cetylpyridinium (CEPACOL) lozenge 3 mg  1 lozenge Oral PRN Elodia Florence, PA-C       Or  . phenol (CHLORASEPTIC) mouth spray 1 spray  1 spray Mouth/Throat PRN Elodia Florence, PA-C      . methocarbamol (ROBAXIN) tablet 500 mg  500 mg Oral Q6H PRN Elodia Florence, PA-C   500 mg at 08/21/17 2259   Or  . methocarbamol (ROBAXIN) 500 mg in dextrose 5 % 50 mL IVPB  500 mg Intravenous Q6H PRN Elodia Florence, PA-C      . metoCLOPramide (REGLAN) tablet 5-10 mg  5-10 mg Oral Q8H PRN Elodia Florence, PA-C       Or  . metoCLOPramide (REGLAN) injection 5-10 mg  5-10 mg Intravenous Q8H PRN Elodia Florence, PA-C      . montelukast (SINGULAIR) tablet 10 mg  10 mg Oral QAC breakfast Elodia Florence, PA-C   10 mg at 08/22/17 0943  . ondansetron (ZOFRAN) tablet 4 mg  4 mg Oral Q6H  PRN Elodia Florence, PA-C       Or  . ondansetron [facility]) injection 4 mg  4 mg Intravenous Q6H PRN Elodia Florence, PA-C      . oxyCODONE (Oxy IR/ROXICODONE) immediate release tablet 10-15 mg  10-15 mg Oral Q4H PRN Elodia Florence, PA-C   15 mg at 08/21/17 2259  . oxyCODONE (Oxy IR/ROXICODONE) immediate release tablet 5-10 mg  5-10 mg Oral Q4H PRN Elodia Florence, PA-C   5 mg at 08/22/17 0942  . pantoprazole (PROTONIX) EC tablet 80 mg  80 mg Oral Daily  Elodia Florence, PA-C   80 mg at 08/22/17 0943  . potassium chloride SA (K-DUR,KLOR-CON) CR tablet 20 mEq  20 mEq Oral BID Elodia Florence, PA-C   20 mEq at 08/22/17 0943  . promethazine (PHENERGAN) tablet 25 mg  25 mg Oral TID PRN Elodia Florence, PA-C         Discharge Medications: Please see discharge summary for a list of discharge medications.  Relevant Imaging Results:  Relevant Lab Results:   Additional Information SS#: [id number] [id number]  Tresa Moore, LCSW

## 2017-08-22 NOTE — Progress Notes (Signed)
Physical Therapy Treatment Patient Details Name: Allison [name] MRN: [id number] DOB: October 23, 1950 Today's Date: 08/22/2017    History of Present Illness Pt is a 67 y/o female s/p elective R TKA. PMH includes deafness, a fib, non ischemic cardiomyopathy, HTN, CHF, fibromyalgia, bilat TSA, and s/p MVR.     PT Comments    Patient progressing well with therapy at this time.  Able to ambulate short distances in hospital room with min A and begin practicing therex. Patient is a falls risk and will benefit from sub acute rehab once medically cleared for  d/c.   Follow Up Recommendations  Follow surgeon's recommendation for DC plan and follow-up therapies;Supervision for mobility/OOB     Equipment Recommendations  3in1 (PT)    Recommendations for Other Services OT consult     Precautions / Restrictions Precautions Precautions: Knee;Fall Precaution Booklet Issued: No Precaution Comments: Reviewed knee precautions with pt.  Restrictions Weight Bearing Restrictions: Yes RLE Weight Bearing: Weight bearing as tolerated    Mobility  Bed Mobility                  Transfers Overall transfer level: Needs assistance Equipment used: Rolling walker (2 wheeled) Transfers: Sit to/from [employer] Transfers Sit to Stand: Mod assist Stand pivot transfers: Mod assist       General transfer comment: pt still anxious but able to stand with mod A and then once standing become min guard.   Ambulation/Gait Ambulation/Gait assistance: Min guard;Min assist Ambulation Distance (Feet): 25 Feet Assistive device: Rolling walker (2 wheeled) Gait Pattern/deviations: Step-to pattern Gait velocity: decreased   General Gait Details: ambulating around room with heavy cueing for sequencing. able to progress to min guard at times and perform proper sequecning.    Stairs             Wheelchair Mobility    Modified Rankin (Stroke Patients Only)       Balance Overall balance  assessment: Needs assistance Sitting-balance support: No upper extremity supported;Feet supported Sitting balance-Leahy Scale: Fair     Standing balance support: Bilateral upper extremity supported;During functional activity Standing balance-Leahy Scale: Poor Standing balance comment: Reliant on BUE support.                             Cognition Arousal/Alertness: Awake/alert Behavior During Therapy: WFL for tasks assessed/performed Overall Cognitive Status: Within Functional Limits for tasks assessed                                        Exercises Total Joint Exercises Ankle Circles/Pumps: AROM;Both;20 reps Quad Sets: AROM;10 reps Knee Flexion: AROM;10 reps    General Comments General comments (skin integrity, edema, etc.): tele interrupter utilized for session.       Pertinent Vitals/Pain Pain Assessment: Faces Faces Pain Scale: Hurts even more Pain Location: R knee  Pain Descriptors / Indicators: Aching;Operative site guarding;Throbbing Pain Intervention(s): Limited activity within patient's tolerance;Monitored during session;Premedicated before session;Repositioned    Home Living   Living Arrangements: Spouse/significant other Available Help at Discharge: Family Type of Home: House Home Access: Stairs to enter Entrance Stairs-Rails: None Home Layout: One level Home Equipment: Cane - single point;Walker - 2 wheels;Shower seat;Grab bars - tub/shower;Hand held shower head      Prior Function            PT Goals (current goals  can now be found in the care plan section) Acute Rehab PT Goals Patient Stated Goal: to get better  PT Goal Formulation: With patient/family Time For Goal Achievement: 09/04/17 Potential to Achieve Goals: Good    Frequency    7X/week      PT Plan Current plan remains appropriate    Co-evaluation              AM-PAC PT "6 Clicks" Daily Activity  Outcome Measure  Difficulty turning over in  bed (including adjusting bedclothes, sheets and blankets)?: A Little Difficulty moving from lying on back to sitting on the side of the bed? : Unable Difficulty sitting down on and standing up from a chair with arms (e.g., wheelchair, bedside commode, etc,.)?: Unable Help needed moving to and from a bed to chair (including a wheelchair)?: A Little Help needed walking in hospital room?: A Lot Help needed climbing 3-5 steps with a railing? : A Lot 6 Click Score: 12    End of Session Equipment Utilized During Treatment: Gait belt;Right knee immobilizer Activity Tolerance: Patient limited by pain Patient left: in chair;with call bell/phone within reach;with family/visitor present Nurse Communication: Mobility status PT Visit Diagnosis: Unsteadiness on feet (R26.81);Muscle weakness (generalized) (M62.81);Pain Pain - Right/Left: Right Pain - part of body: Knee     Time: 4481-8563 PT Time Calculation (min) (ACUTE ONLY): 32 min  Charges:  $Gait Training: 8-22 mins $Therapeutic Exercise: 8-22 mins                    G Codes:      Etta Grandchild, PT, DPT Acute Rehab Services Pager: [phone]     Etta Grandchild 08/22/2017, 11:46 AM

## 2017-08-23 NOTE — Social Work (Signed)
CSW met with patient earlier today and discussed the SNF bed at [facility] and the insurance auth needed for admission. CSW used stratus interpretation to explain the next steps in disposition. Pt agreed to placement and will discharge to SNF tomorrow when they obtain Insurance Auth.  Elissa Hefty, LCSW Clinical Social Worker [phone]

## 2017-08-23 NOTE — Progress Notes (Signed)
Physical Therapy Treatment Patient Details Name: Allison [name] MRN: [id number] DOB: [birth year] Today's Date: 08/23/2017    History of Present Illness Pt is a 67 y/o female s/p elective R TKA. PMH includes deafness, a fib, non ischemic cardiomyopathy, HTN, CHF, fibromyalgia, bilat TSA, and s/p MVR.     PT Comments    Patient progressing well with therapy this visit. Ambulating further distance with min guard, participating in therex. SNF recs still appropriate at this time.   Follow Up Recommendations  Follow surgeon's recommendation for DC plan and follow-up therapies;Supervision for mobility/OOB     Equipment Recommendations  3in1 (PT)    Recommendations for Other Services OT consult     Precautions / Restrictions Precautions Precautions: Knee;Fall Precaution Booklet Issued: No Restrictions Weight Bearing Restrictions: Yes RLE Weight Bearing: Weight bearing as tolerated    Mobility  Bed Mobility               General bed mobility comments: OOB at entry  Transfers Overall transfer level: Needs assistance Equipment used: Rolling walker (2 wheeled) Transfers: Sit to/from [employer] Sit to Stand: Min guard Stand pivot transfers: Min guard          Ambulation/Gait Ambulation/Gait assistance: Min guard Ambulation Distance (Feet): 50 Feet Assistive device: Rolling walker (2 wheeled) Gait Pattern/deviations: Step-to pattern Gait velocity: decreased   General Gait Details: patient with proper sequencing this visit, able to ambulate further into hallway with min guard   Stairs             Wheelchair Mobility    Modified Rankin (Stroke Patients Only)       Balance Overall balance assessment: Needs assistance Sitting-balance support: No upper extremity supported;Feet supported Sitting balance-Leahy Scale: Fair     Standing balance support: Bilateral upper extremity supported;During functional activity Standing balance-Leahy  Scale: Poor Standing balance comment: Reliant on BUE support.                             Cognition Arousal/Alertness: Awake/alert Behavior During Therapy: Anxious Overall Cognitive Status: Impaired/Different from baseline Area of Impairment: Memory                     Memory: Decreased short-term memory                Exercises Total Joint Exercises Long Arc Quad: 10 reps;AROM Knee Flexion: AROM;10 reps Goniometric ROM: 90* flexion     General Comments        Pertinent Vitals/Pain Pain Assessment: Faces Faces Pain Scale: Hurts even more Pain Location: R knee  Pain Descriptors / Indicators: Aching;Operative site guarding;Throbbing Pain Intervention(s): Limited activity within patient's tolerance;Premedicated before session;Monitored during session;Repositioned    Home Living                      Prior Function            PT Goals (current goals can now be found in the care plan section) Acute Rehab PT Goals PT Goal Formulation: With patient/family Time For Goal Achievement: 09/04/17 Potential to Achieve Goals: Good Progress towards PT goals: Progressing toward goals    Frequency    7X/week      PT Plan Current plan remains appropriate    Co-evaluation              AM-PAC PT "6 Clicks" Daily Activity  Outcome Measure  Difficulty turning over in  bed (including adjusting bedclothes, sheets and blankets)?: A Little Difficulty moving from lying on back to sitting on the side of the bed? : Unable Difficulty sitting down on and standing up from a chair with arms (e.g., wheelchair, bedside commode, etc,.)?: Unable Help needed moving to and from a bed to chair (including a wheelchair)?: A Little Help needed walking in hospital room?: A Lot Help needed climbing 3-5 steps with a railing? : A Lot 6 Click Score: 12    End of Session Equipment Utilized During Treatment: Gait belt Activity Tolerance: Patient limited by  pain Patient left: in chair;with call bell/phone within reach;with family/visitor present Nurse Communication: Mobility status PT Visit Diagnosis: Unsteadiness on feet (R26.81);Muscle weakness (generalized) (M62.81);Pain Pain - Right/Left: Right Pain - part of body: Knee     Time: 1400-1415 PT Time Calculation (min) (ACUTE ONLY): 15 min  Charges:  $Gait Training: 8-22 mins                    G Codes:      Etta Grandchild, PT, DPT Acute Rehab Services Pager: [phone]    Etta Grandchild 08/23/2017, 2:17 PM

## 2017-08-23 NOTE — Progress Notes (Signed)
Upon skin assessment, there is an area on the right mid back that has a bandage from cyst removal on 08/03/17. I called Dr. Fredricka Bonine who performed the surgery. Dr. Fredricka Bonine stated that she took the sutures out last week and that the wound must have dehisced. Verbal orders were given to do wet to dry dressings daily.

## 2017-08-23 NOTE — Social Work (Signed)
CSW contacted admission staff at [facility] and [facility] as patient interested in [facility]. CSW left message and will f/u as SNF has not made an SNF offer.   SNF offers pending.  Keene Breath, LCSW Clinical Social Worker [phone]

## 2017-08-23 NOTE — Progress Notes (Signed)
Subjective: 2 Days Post-Op Procedure(s) (LRB): RIGHT TOTAL KNEE ARTHROPLASTY (Right)  Activity level:  wbat Diet tolerance:  ok Voiding:  ok Patient reports pain as mild.    Objective: Vital signs in last 24 hours: Temp:  [98.6 F (37 C)-99.4 F (37.4 C)] 99.4 F (37.4 C) (04/25 0709) Pulse Rate:  [86-91] 91 (04/25 0709) Resp:  [16-18] 16 (04/25 0709) BP: (108-116)/(51-55) 116/54 (04/25 0709) SpO2:  [91 %-100 %] 91 % (04/25 0709)  Labs: No results for input(s): HGB in the last 72 hours. No results for input(s): WBC, RBC, HCT, PLT in the last 72 hours. No results for input(s): NA, K, CL, CO2, BUN, CREATININE, GLUCOSE, CALCIUM in the last 72 hours. No results for input(s): LABPT, INR in the last 72 hours.  Physical Exam:  Neurologically intact ABD soft Neurovascular intact Sensation intact distally Intact pulses distally Dorsiflexion/Plantar flexion intact Incision: dressing C/D/I and no drainage No cellulitis present Compartment soft  Assessment/Plan:  2 Days Post-Op Procedure(s) (LRB): RIGHT TOTAL KNEE ARTHROPLASTY (Right) Advance diet Up with therapy Plan for discharge tomorrow Discharge to SNF if doing well and cleared by PT. Continue on home Eliquis for DVT prevention. Follow up in office 2 weeks post op.  Anticipated LOS equal to or greater than 2 midnights due to - Age 69 and older with one or more of the following:  - Obesity  - Expected need for hospital services (PT, OT, Nursing) required for safe  discharge  - Anticipated need for postoperative skilled nursing care or inpatient rehab  - Active co-morbidities: Heart Failure and Cardiac Arrhythmia OR   - Unanticipated findings during/Post Surgery: Slow post-op progression: GI, pain control, mobility  - Patient is a high risk of re-admission due to: Barriers to post-acute care (logistical, no family support in home)   Terreon Ekholm, Ginger Organ 08/23/2017, 1:24 PM

## 2017-08-23 NOTE — Progress Notes (Signed)
Discussed pain management with patient, and pain level. Education on leg exercises while in chair and ice to help with pain and inflammation. Patient verbally understood instructions. Will continue to educate on topics

## 2017-08-23 NOTE — Social Work (Signed)
CSW f/u with [facility] and they have offered a SNF bed. SNF will obtain Insurance Auth for SNF placement.  CSW will f/u as patient should dc tomorrow as SNF will have auth by that time.  CSW will f/u for disposition.  Keene Breath, LCSW Clinical Social Worker [phone]

## 2017-08-24 MED ORDER — TIZANIDINE HCL 4 MG PO TABS: 4.0000 mg | ORAL_TABLET | Freq: Four times a day (QID) | ORAL | 1 refills | Status: DC | PRN

## 2017-08-24 MED ORDER — BISACODYL 5 MG PO TBEC: 5.0000 mg | DELAYED_RELEASE_TABLET | Freq: Every day | ORAL | 0 refills | Status: DC | PRN

## 2017-08-24 MED ORDER — OXYCODONE-ACETAMINOPHEN 7.5-325 MG PO TABS: 1.0000 | ORAL_TABLET | ORAL | 0 refills | Status: DC | PRN

## 2017-08-24 MED ORDER — TIZANIDINE HCL 2 MG PO TABS: 4.0000 mg | ORAL_TABLET | Freq: Four times a day (QID) | ORAL | 0 refills | Status: AC | PRN

## 2017-08-24 NOTE — Progress Notes (Signed)
[name] to be D/C'd [name]) per MD order.  Discussed prescriptions and follow up appointments with the patient. Prescriptions given to patient, medication list explained in detail. Pt verbalized understanding.  Allergies as of 08/24/2017      Reactions   Biaxin [clarithromycin] Nausea Only, Other (See Comments)   Dizzy, blurred vision, achy stomach   Trazodone And Nefazodone Other (See Comments)   Weakness in legs Numbness in arms Funny feeling Rapid heart beat Lose focus   Sulfa Antibiotics Other (See Comments)   Blister on large toe   Aleve [naproxen] Nausea And Vomiting, Other (See Comments)   Stomach ache   Bextra [valdecoxib] Nausea And Vomiting, Other (See Comments)   Stomach ache   Doxycycline Nausea Only, Other (See Comments)   Stomach ache   Durabac [apap-salicyl-phenyltolox-caff] Nausea And Vomiting   Estradiol Nausea Only, Other (See Comments)   Hurt stomach   Macrobid [nitrofurantoin] Other (See Comments)   BLOATING   Motrin [ibuprofen] Nausea Only, Other (See Comments)   Stomach ahce   Oxaprozin Nausea Only, Other (See Comments)   Hurt stomach   Robaxin [methocarbamol] Nausea Only, Other (See Comments)   Hurt stomach   Topamax [topiramate] Nausea And Vomiting      Medication List    STOP taking these medications   diclofenac 75 MG EC tablet Commonly known as:  VOLTAREN   HYDROcodone-acetaminophen 5-325 MG tablet Commonly known as:  NORCO/VICODIN   traMADol 50 MG tablet Commonly known as:  ULTRAM     TAKE these medications   apixaban 5 MG Tabs tablet Commonly known as:  ELIQUIS Take 1 tablet (5 mg total) by mouth 2 (two) times daily.   aspirin 81 MG EC tablet Take 1 tablet (81 mg total) by mouth daily.   atorvastatin 20 MG tablet Commonly known as:  LIPITOR Take 20 mg by mouth daily.   bisacodyl 5 MG EC tablet Commonly known as:  DULCOLAX Take 1 tablet (5 mg total) by mouth daily as needed for moderate constipation.    CALTRATE 600+D 600-400 MG-UNIT tablet Generic drug:  Calcium Carbonate-Vitamin D Take 1 tablet by mouth daily.   carvedilol 12.5 MG tablet Commonly known as:  COREG TAKE 1 TABLET BY MOUTH TWO  TIMES DAILY WITH MEALS   cetirizine 10 MG tablet Commonly known as:  ZYRTEC Take 10 mg by mouth daily.   diphenhydrAMINE 25 mg capsule Commonly known as:  BENADRYL Take 25 mg by mouth daily as needed for itching.   docusate sodium 100 MG capsule Commonly known as:  COLACE Take 1 capsule (100 mg total) by mouth 2 (two) times daily.   fluticasone 50 MCG/ACT nasal spray Commonly known as:  FLONASE Place 2 sprays into both nostrils daily.   furosemide 40 MG tablet Commonly known as:  LASIX Take 1 tablet (40 mg total) by mouth 2 (two) times daily.   gabapentin 600 MG tablet Commonly known as:  NEURONTIN Take 600 mg by mouth 3 (three) times daily.   levothyroxine 137 MCG tablet Commonly known as:  SYNTHROID, LEVOTHROID Take 137 mcg by mouth daily before breakfast.   LORazepam 2 MG tablet Commonly known as:  ATIVAN Take 1 tablet (2 mg total) by mouth at bedtime. For restful sleep What changed:    how much to take  when to take this  additional instructions   montelukast 10 MG tablet Commonly known as:  SINGULAIR Take 10 mg by mouth daily before breakfast.   omeprazole 40 MG  capsule Commonly known as:  PRILOSEC Take 40 mg by mouth daily.   oxyCODONE-acetaminophen 7.5-325 MG tablet Commonly known as:  PERCOCET Take 1-2 tablets by mouth every 4 (four) hours as needed for severe pain.   Polyethylene Glycol 3350 Powd by Does not apply route 2 (two) times daily.   potassium chloride SA 20 MEQ tablet Commonly known as:  K-DUR,KLOR-CON Take 1 tablet (20 mEq total) by mouth 2 (two) times daily.   promethazine 25 MG tablet Commonly known as:  PHENERGAN Take 25 mg by mouth 3 (three) times daily as needed for nausea or vomiting.   ranitidine 150 MG tablet Commonly known as:   ZANTAC Take 150 mg by mouth daily as needed for heartburn.   tiZANidine 4 MG tablet Commonly known as:  ZANAFLEX Take 1 tablet (4 mg total) by mouth every 6 (six) hours as needed. What changed:  You were already taking a medication with the same name, and this prescription was added. Make sure you understand how and when to take each.   tiZANidine 2 MG tablet Commonly known as:  ZANAFLEX Take 2 tablets (4 mg total) by mouth every 6 (six) hours as needed for muscle spasms. What changed:    how much to take  when to take this   Vitamin B-12 5000 MCG Subl Place 1 tablet under the tongue daily. For nerve pain in feet and metabolism support            Durable Medical Equipment  (From admission, onward)        Start     Ordered   08/21/17 1704  DME Walker rolling  Once    Question:  Patient needs a walker to treat with the following condition  Answer:  Primary osteoarthritis of right knee   08/21/17 1703   08/21/17 1704  DME 3 n 1  Once     08/21/17 1703   08/21/17 1704  DME Bedside commode  Once    Question:  Patient needs a bedside commode to treat with the following condition  Answer:  Primary osteoarthritis of right knee   08/21/17 1703      Vitals:   08/23/17 2108 08/24/17 0610  BP: 119/66 131/64  Pulse: 87 95  Resp: 20 18  Temp: 98.9 F (37.2 C) 99.1 F (37.3 C)  SpO2: 98% 94%    Skin clean, dry and intact without evidence of skin break down, no evidence of skin tears noted. Aquacel dressing clean, dry, and intact with ice applied to site. TED hose on bilateral lower extremities. IV catheter discontinued intact. Site without signs and symptoms of complications. Dressing and pressure applied. Pt denies pain at this time. No complaints noted.  An After Visit Summary and prescriptions were printed and given to the patient. Patient escorted via WC, and D/C [facility] ([name] via private automobile by family member (Amy-daughter).   Allison Devine Klingel RN

## 2017-08-24 NOTE — Discharge Summary (Signed)
Patient ID: Allison [name] MRN: [id number] DOB/AGE: 1950-11-11 67 y.o.  Admit date: 08/21/2017 Discharge date: 08/24/2017  Admission Diagnoses:  Principal Problem:   Primary osteoarthritis of right knee   Discharge Diagnoses:  Same  Past Medical History:  Diagnosis Date  . AC (acromioclavicular) joint bone spurs    spurs in shoulders with pain  . Anxiety   . Aortic insufficiency   . Arthritis    all over  . Chronic combined systolic and diastolic CHF (congestive heart failure) (HCC)   . Constipation   . Coronary artery disease   . Deafness   . Dysrhythmia   . Fatty liver   . Fibromyalgia   . GERD (gastroesophageal reflux disease)   . Hyperlipidemia   . Hypertension   . Hypothyroidism   . IBS (irritable bowel syndrome)   . Mitral regurgitation   . Neuropathy   . Non-ischemic cardiomyopathy (HCC)    a. Normal cors 01/2016.  . Osteoarthritis of glenohumeral joint, left    Left shoulder  . Osteopenia   . Persistent atrial fibrillation (HCC)   . PFO (patent foramen ovale)    a. closure 03/2016.  . Pre-diabetes   . S/P aortic valve replacement with bioprosthetic valve 04/13/2016   23 mm [facility] Ease bovine pericardial tissue valve  . S/P mitral valve repair 04/13/2016   26 mm Sorin Memo 3D ring annuloplasty  . 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 Tricuspid regurgitation     Surgeries: Procedure(s): RIGHT TOTAL KNEE ARTHROPLASTY on 08/21/2017   Consultants:   Discharged Condition: Improved  Hospital Course: Allison [name] is an 67 y.o. female who was admitted 08/21/2017 for operative treatment ofPrimary osteoarthritis of right knee. Patient has severe unremitting pain that affects sleep, daily activities, and work/hobbies. After pre-op clearance the patient was taken to the operating room on 08/21/2017 and underwent  Procedure(s): RIGHT TOTAL KNEE ARTHROPLASTY.    Patient was given perioperative antibiotics:   Anti-infectives (From admission, onward)   Start     Dose/Rate Route Frequency Ordered Stop   08/21/17 1930  ceFAZolin (ANCEF) IVPB 2g/100 mL premix     2 g 200 mL/hr over 30 Minutes Intravenous Every 6 hours 08/21/17 1703 08/22/17 0140   08/21/17 1130  ceFAZolin (ANCEF) IVPB 2g/100 mL premix     2 g 200 mL/hr over 30 Minutes Intravenous To [facility]Stay [facility] 08/20/17 1256 08/21/17 1330       Patient was given sequential compression devices, early ambulation, and chemoprophylaxis to prevent DVT.  Patient benefited maximally from hospital stay and there were no complications.    Recent vital signs:  Patient Vitals for the past 24 hrs:  BP Temp Temp src Pulse Resp SpO2  08/24/17 0610 131/64 99.1 F (37.3 C) Oral 95 18 94 %  08/23/17 2108 119/66 98.9 F (37.2 C) Oral 87 20 98 %  08/23/17 1416 101/66 99.2 F (37.3 C) Oral 82 - 98 %     Recent laboratory studies: No results for input(s): WBC, HGB, HCT, PLT, NA, K, CL, CO2, BUN, CREATININE, GLUCOSE, INR, CALCIUM in the last 72 hours.  Invalid input(s): PT, 2   Discharge Medications:   Allergies as of 08/24/2017      Reactions   Biaxin [clarithromycin] Nausea Only, Other (See Comments)   Dizzy, blurred vision, achy stomach   Trazodone And Nefazodone Other (See Comments)   Weakness in legs Numbness in arms Funny feeling Rapid heart beat Lose  focus   Sulfa Antibiotics Other (See Comments)   Blister on large toe   Aleve [naproxen] Nausea And Vomiting, Other (See Comments)   Stomach ache   Bextra [valdecoxib] Nausea And Vomiting, Other (See Comments)   Stomach ache   Doxycycline Nausea Only, Other (See Comments)   Stomach ache   Durabac [apap-salicyl-phenyltolox-caff] Nausea And Vomiting   Estradiol Nausea Only, Other (See Comments)   Hurt stomach   Macrobid [nitrofurantoin] Other (See Comments)   BLOATING   Motrin [ibuprofen] Nausea Only, Other (See Comments)   Stomach ahce   Oxaprozin Nausea Only, Other (See Comments)    Hurt stomach   Robaxin [methocarbamol] Nausea Only, Other (See Comments)   Hurt stomach   Topamax [topiramate] Nausea And Vomiting      Medication List    STOP taking these medications   diclofenac 75 MG EC tablet Commonly known as:  VOLTAREN   HYDROcodone-acetaminophen 5-325 MG tablet Commonly known as:  NORCO/VICODIN   traMADol 50 MG tablet Commonly known as:  ULTRAM     TAKE these medications   apixaban 5 MG Tabs tablet Commonly known as:  ELIQUIS Take 1 tablet (5 mg total) by mouth 2 (two) times daily.   aspirin 81 MG EC tablet Take 1 tablet (81 mg total) by mouth daily.   atorvastatin 20 MG tablet Commonly known as:  LIPITOR Take 20 mg by mouth daily.   bisacodyl 5 MG EC tablet Commonly known as:  DULCOLAX Take 1 tablet (5 mg total) by mouth daily as needed for moderate constipation.   CALTRATE 600+D 600-400 MG-UNIT tablet Generic drug:  Calcium Carbonate-Vitamin D Take 1 tablet by mouth daily.   carvedilol 12.5 MG tablet Commonly known as:  COREG TAKE 1 TABLET BY MOUTH TWO  TIMES DAILY WITH MEALS   cetirizine 10 MG tablet Commonly known as:  ZYRTEC Take 10 mg by mouth daily.   diphenhydrAMINE 25 mg capsule Commonly known as:  BENADRYL Take 25 mg by mouth daily as needed for itching.   docusate sodium 100 MG capsule Commonly known as:  COLACE Take 1 capsule (100 mg total) by mouth 2 (two) times daily.   fluticasone 50 MCG/ACT nasal spray Commonly known as:  FLONASE Place 2 sprays into both nostrils daily.   furosemide 40 MG tablet Commonly known as:  LASIX Take 1 tablet (40 mg total) by mouth 2 (two) times daily.   gabapentin 600 MG tablet Commonly known as:  NEURONTIN Take 600 mg by mouth 3 (three) times daily.   levothyroxine 137 MCG tablet Commonly known as:  SYNTHROID, LEVOTHROID Take 137 mcg by mouth daily before breakfast.   LORazepam 2 MG tablet Commonly known as:  ATIVAN Take 1 tablet (2 mg total) by mouth at bedtime. For  restful sleep What changed:    how much to take  when to take this  additional instructions   montelukast 10 MG tablet Commonly known as:  SINGULAIR Take 10 mg by mouth daily before breakfast.   omeprazole 40 MG capsule Commonly known as:  PRILOSEC Take 40 mg by mouth daily.   oxyCODONE-acetaminophen 7.5-325 MG tablet Commonly known as:  PERCOCET Take 1-2 tablets by mouth every 4 (four) hours as needed for severe pain.   Polyethylene Glycol 3350 Powd by Does not apply route 2 (two) times daily.   potassium chloride SA 20 MEQ tablet Commonly known as:  K-DUR,KLOR-CON Take 1 tablet (20 mEq total) by mouth 2 (two) times daily.   promethazine 25 MG  tablet Commonly known as:  PHENERGAN Take 25 mg by mouth 3 (three) times daily as needed for nausea or vomiting.   ranitidine 150 MG tablet Commonly known as:  ZANTAC Take 150 mg by mouth daily as needed for heartburn.   tiZANidine 4 MG tablet Commonly known as:  ZANAFLEX Take 1 tablet (4 mg total) by mouth every 6 (six) hours as needed. What changed:  You were already taking a medication with the same name, and this prescription was added. Make sure you understand how and when to take each.   tiZANidine 2 MG tablet Commonly known as:  ZANAFLEX Take 2 tablets (4 mg total) by mouth every 6 (six) hours as needed for muscle spasms. What changed:    how much to take  when to take this   Vitamin B-12 5000 MCG Subl Place 1 tablet under the tongue daily. For nerve pain in feet and metabolism support            Durable Medical Equipment  (From admission, onward)        Start     Ordered   08/21/17 1704  DME Walker rolling  Once    Question:  Patient needs a walker to treat with the following condition  Answer:  Primary osteoarthritis of right knee   08/21/17 1703   08/21/17 1704  DME 3 n 1  Once     08/21/17 1703   08/21/17 1704  DME Bedside commode  Once    Question:  Patient needs a bedside commode to treat with  the following condition  Answer:  Primary osteoarthritis of right knee   08/21/17 1703      Diagnostic Studies: No results found.  Disposition: Discharge disposition: 01-Home or Self Care       Discharge Instructions    Call MD / Call 911   Complete by:  As directed    If you experience chest pain or shortness of breath, CALL 911 and be transported to the hospital emergency room.  If you develope a fever above 101 F, pus (white drainage) or increased drainage or redness at the wound, or calf pain, call your surgeon's office.   Constipation Prevention   Complete by:  As directed    Drink plenty of fluids.  Prune juice may be helpful.  You may use a stool softener, such as Colace (over the counter) 100 mg twice a day.  Use MiraLax (over the counter) for constipation as needed.   Diet - low sodium heart healthy   Complete by:  As directed    Discharge instructions   Complete by:  As directed    INSTRUCTIONS AFTER JOINT REPLACEMENT   Remove items at home which could result in a fall. This includes throw rugs or furniture in walking pathways ICE to the affected joint every three hours while awake for 30 minutes at a time, for at least the first 3-5 days, and then as needed for pain and swelling.  Continue to use ice for pain and swelling. You may notice swelling that will progress down to the foot and ankle.  This is normal after surgery.  Elevate your leg when you are not up walking on it.   Continue to use the breathing machine you got in the hospital (incentive spirometer) which will help keep your temperature down.  It is common for your temperature to cycle up and down following surgery, especially at night when you are not up moving around and exerting yourself.  The breathing machine keeps your lungs expanded and your temperature down.   DIET:  As you were doing prior to hospitalization, we recommend a well-balanced diet.  DRESSING / WOUND CARE / SHOWERING  You may shower 3 days  after surgery, but keep the wounds dry during showering.  You may use an occlusive plastic wrap (Press'n Seal for example), NO SOAKING/SUBMERGING IN THE BATHTUB.  If the bandage gets wet, change with a clean dry gauze.  If the incision gets wet, pat the wound dry with a clean towel.  ACTIVITY  Increase activity slowly as tolerated, but follow the weight bearing instructions below.   No driving for 6 weeks or until further direction given by your physician.  You cannot drive while taking narcotics.  No lifting or carrying greater than 10 lbs. until further directed by your surgeon. Avoid periods of inactivity such as sitting longer than an hour when not asleep. This helps prevent blood clots.  You may return to work once you are authorized by your doctor.     WEIGHT BEARING   Weight bearing as tolerated with assist device (walker, cane, etc) as directed, use it as long as suggested by your surgeon or therapist, typically at least 4-6 weeks.   EXERCISES  Results after joint replacement surgery are often greatly improved when you follow the exercise, range of motion and muscle strengthening exercises prescribed by your doctor. Safety measures are also important to protect the joint from further injury. Any time any of these exercises cause you to have increased pain or swelling, decrease what you are doing until you are comfortable again and then slowly increase them. If you have problems or questions, call your caregiver or physical therapist for advice.   Rehabilitation is important following a joint replacement. After just a few days of immobilization, the muscles of the leg can become weakened and shrink (atrophy).  These exercises are designed to build up the tone and strength of the thigh and leg muscles and to improve motion. Often times heat used for twenty to thirty minutes before working out will loosen up your tissues and help with improving the range of motion but do not use heat for  the first two weeks following surgery (sometimes heat can increase post-operative swelling).   These exercises can be done on a training (exercise) mat, on the floor, on a table or on a bed. Use whatever works the best and is most comfortable for you.    Use music or television while you are exercising so that the exercises are a pleasant break in your day. This will make your life better with the exercises acting as a break in your routine that you can look forward to.   Perform all exercises about fifteen times, three times per day or as directed.  You should exercise both the operative leg and the other leg as well.   Exercises include:   Quad Sets - Tighten up the muscle on the front of the thigh (Quad) and hold for 5-10 seconds.   Straight Leg Raises - With your knee straight (if you were given a brace, keep it on), lift the leg to 60 degrees, hold for 3 seconds, and slowly lower the leg.  Perform this exercise against resistance later as your leg gets stronger.  Leg Slides: Lying on your back, slowly slide your foot toward your buttocks, bending your knee up off the floor (only go as far as is comfortable). Then slowly slide your foot  back down until your leg is flat on the floor again.  Angel Wings: Lying on your back spread your legs to the side as far apart as you can without causing discomfort.  Hamstring Strength:  Lying on your back, push your heel against the floor with your leg straight by tightening up the muscles of your buttocks.  Repeat, but this time bend your knee to a comfortable angle, and push your heel against the floor.  You may put a pillow under the heel to make it more comfortable if necessary.   A rehabilitation program following joint replacement surgery can speed recovery and prevent re-injury in the future due to weakened muscles. Contact your doctor or a physical therapist for more information on knee rehabilitation.    CONSTIPATION  Constipation is defined medically  as fewer than three stools per week and severe constipation as less than one stool per week.  Even if you have a regular bowel pattern at home, your normal regimen is likely to be disrupted due to multiple reasons following surgery.  Combination of anesthesia, postoperative narcotics, change in appetite and fluid intake all can affect your bowels.   YOU MUST use at least one of the following options; they are listed in order of increasing strength to get the job done.  They are all available over the counter, and you may need to use some, POSSIBLY even all of these options:    Drink plenty of fluids (prune juice may be helpful) and high fiber foods Colace 100 mg by mouth twice a day  Senokot for constipation as directed and as needed Dulcolax (bisacodyl), take with full glass of water  Miralax (polyethylene glycol) once or twice a day as needed.  If you have tried all these things and are unable to have a bowel movement in the first 3-4 days after surgery call either your surgeon or your primary doctor.    If you experience loose stools or diarrhea, hold the medications until you stool forms back up.  If your symptoms do not get better within 1 week or if they get worse, check with your doctor.  If you experience "the worst abdominal pain ever" or develop nausea or vomiting, please contact the office immediately for further recommendations for treatment.   ITCHING:  If you experience itching with your medications, try taking only a single pain pill, or even half a pain pill at a time.  You can also use Benadryl over the counter for itching or also to help with sleep.   TED HOSE STOCKINGS:  Use stockings on both legs until for at least 2 weeks or as directed by physician office. They may be removed at night for sleeping.  MEDICATIONS:  See your medication summary on the "After Visit Summary" that nursing will review with you.  You may have some home medications which will be placed on hold until you  complete the course of blood thinner medication.  It is important for you to complete the blood thinner medication as prescribed.  PRECAUTIONS:  If you experience chest pain or shortness of breath - call 911 immediately for transfer to the hospital emergency department.   If you develop a fever greater that 101 F, purulent drainage from wound, increased redness or drainage from wound, foul odor from the wound/dressing, or calf pain - CONTACT YOUR SURGEON.  FOLLOW-UP APPOINTMENTS:  If you do not already have a post-op appointment, please call the office for an appointment to be seen by your surgeon.  Guidelines for how soon to be seen are listed in your "After Visit Summary", but are typically between 1-4 weeks after surgery.  OTHER INSTRUCTIONS:   Knee Replacement:  Do not place pillow under knee, focus on keeping the knee straight while resting. CPM instructions: 0-90 degrees, 2 hours in the morning, 2 hours in the afternoon, and 2 hours in the evening. Place foam block, curve side up under heel at all times except when in CPM or when walking.  DO NOT modify, tear, cut, or change the foam block in any way.  MAKE SURE YOU:  Understand these instructions.  Get help right away if you are not doing well or get worse.    Thank you for letting us be a part of your medical care team.  It is a privilege we respect greatly.  We hope these instructions will help you stay on track for a fast and full recovery!   Increase activity slowly as tolerated   Complete by:  As directed        Contact information for follow-up providers    Marcene Corning, MD. Schedule an appointment as soon as possible for a visit in 2 weeks.   Specialty:  Orthopedic Surgery Contact information: [address] [place] [address] [phone]            Contact information for after-discharge care    Destination    [facility] .   Service:  Skilled  Nursing Contact information: [address] [place] [address] [phone]                   Signed: Drema Halon 08/24/2017, 8:04 AM

## 2017-08-24 NOTE — Progress Notes (Signed)
Called and gave report to [place] at [employer].

## 2017-08-24 NOTE — Care Management Important Message (Signed)
Important Message  Patient Details  Name: Allison [name] MRN: [id number] Date of Birth: [birth year]   Medicare Important Message Given:  Yes    Dorena Bodo 08/24/2017, 1:45 PM

## 2017-08-24 NOTE — Social Work (Signed)
Clinical Social Worker facilitated patient discharge including contacting patient family and facility to confirm patient discharge plans.  Clinical information faxed to facility and family agreeable with plan.    Pt will be transported to [name] by family member.    RN to call [phone] to give report prior to discharge.  Pt going to Room 45B.  Clinical Social Worker will sign off for now as social work intervention is no longer needed. Please consult [place] again if new need arises.  Keene Breath, LCSW Clinical Social Worker [phone]

## 2017-08-24 NOTE — Progress Notes (Signed)
Physical Therapy Treatment Patient Details Name: Allison [name] MRN: [id number] DOB: January 28, 1951 Today's Date: 08/24/2017    History of Present Illness Pt is a 67 y/o female s/p elective R TKA. PMH includes deafness, a fib, non ischemic cardiomyopathy, HTN, CHF, fibromyalgia, bilat TSA, and s/p MVR.     PT Comments    Patient cont to demonstrate progress with therapy today. Ambulating with improved mechanics and min guard. Performed therex, patient requires cueing for form and technique, is likely non compliant with HEP at this time. Pt plans to d/c to SNF today, feel she will continue to do well at next venue.      Follow Up Recommendations  Follow surgeon's recommendation for DC plan and follow-up therapies;Supervision for mobility/OOB     Equipment Recommendations  3in1 (PT)    Recommendations for Other Services OT consult     Precautions / Restrictions Precautions Precautions: Knee;Fall Precaution Booklet Issued: No Precaution Comments: Reviewed knee precautions extensively and requires hand written instructions in addition to the intrepreter Restrictions Weight Bearing Restrictions: Yes RLE Weight Bearing: Weight bearing as tolerated    Mobility  Bed Mobility               General bed mobility comments: OOB at entry  Transfers Overall transfer level: Needs assistance Equipment used: Rolling walker (2 wheeled) Transfers: Sit to/from [employer] Sit to Stand: Min guard Stand pivot transfers: Min guard          Ambulation/Gait Ambulation/Gait assistance: Min guard Ambulation Distance (Feet): 60 Feet Assistive device: Rolling walker (2 wheeled) Gait Pattern/deviations: Step-through pattern;Step-to pattern Gait velocity: decreased   General Gait Details: patient demonstrating consistent proper sequencing and intermittent step through gait. min guard for safety.    Stairs             Wheelchair Mobility    Modified Rankin  (Stroke Patients Only)       Balance Overall balance assessment: Needs assistance Sitting-balance support: No upper extremity supported;Feet supported Sitting balance-Leahy Scale: Fair     Standing balance support: Bilateral upper extremity supported;During functional activity Standing balance-Leahy Scale: Poor Standing balance comment: Reliant on BUE support.                             Cognition Arousal/Alertness: Awake/alert Behavior During Therapy: Anxious Overall Cognitive Status: Impaired/Different from baseline                                        Exercises Total Joint Exercises Quad Sets: AROM;10 reps Long Arc Quad: 10 reps;AROM Knee Flexion: AROM;10 reps Goniometric ROM: 90* flexion    General Comments        Pertinent Vitals/Pain Pain Assessment: Faces Faces Pain Scale: Hurts even more Pain Location: R knee  Pain Descriptors / Indicators: Aching;Operative site guarding;Throbbing Pain Intervention(s): Limited activity within patient's tolerance;Monitored during session;Premedicated before session;Repositioned    Home Living                      Prior Function            PT Goals (current goals can now be found in the care plan section) Acute Rehab PT Goals PT Goal Formulation: With patient/family Time For Goal Achievement: 09/04/17 Potential to Achieve Goals: Good Progress towards PT goals: Progressing toward goals    Frequency  7X/week      PT Plan Current plan remains appropriate    Co-evaluation              AM-PAC PT "6 Clicks" Daily Activity  Outcome Measure  Difficulty turning over in bed (including adjusting bedclothes, sheets and blankets)?: A Little Difficulty moving from lying on back to sitting on the side of the bed? : Unable Difficulty sitting down on and standing up from a chair with arms (e.g., wheelchair, bedside commode, etc,.)?: Unable Help needed moving to and from a bed to  chair (including a wheelchair)?: A Little Help needed walking in hospital room?: A Lot Help needed climbing 3-5 steps with a railing? : A Lot 6 Click Score: 12    End of Session Equipment Utilized During Treatment: Gait belt Activity Tolerance: Patient limited by pain Patient left: in chair;with call bell/phone within reach;with family/visitor present Nurse Communication: Mobility status PT Visit Diagnosis: Unsteadiness on feet (R26.81);Muscle weakness (generalized) (M62.81);Pain Pain - Right/Left: Right Pain - part of body: Knee     Time: 3662-9476 PT Time Calculation (min) (ACUTE ONLY): 21 min  Charges:  $Gait Training: 8-22 mins                    G Codes:       Etta Grandchild, PT, DPT Acute Rehab Services Pager: [phone]    Etta Grandchild 08/24/2017, 10:24 AM

## 2017-08-24 NOTE — Progress Notes (Addendum)
Subjective: 3 Days Post-Op Procedure(s) (LRB): RIGHT TOTAL KNEE ARTHROPLASTY (Right)   Patient states that she is feeling better this morning. She is hoping to go to SNF today. She has not had a BM but is passing plenty of gas. She states she knows that she know the pain medication will back her up so she is going to keep a close eye on it.   Activity level:  wbat Diet tolerance:  ok Voiding:  ok Patient reports pain as mild.    Objective: Vital signs in last 24 hours: Temp:  [98.9 F (37.2 C)-99.2 F (37.3 C)] 99.1 F (37.3 C) (04/26 0610) Pulse Rate:  [82-95] 95 (04/26 0610) Resp:  [18-20] 18 (04/26 0610) BP: (101-131)/(64-66) 131/64 (04/26 0610) SpO2:  [94 %-98 %] 94 % (04/26 0610)  Labs: No results for input(s): HGB in the last 72 hours. No results for input(s): WBC, RBC, HCT, PLT in the last 72 hours. No results for input(s): NA, K, CL, CO2, BUN, CREATININE, GLUCOSE, CALCIUM in the last 72 hours. No results for input(s): LABPT, INR in the last 72 hours.  Physical Exam:  Neurologically intact ABD soft Neurovascular intact Sensation intact distally Intact pulses distally Dorsiflexion/Plantar flexion intact Incision: dressing C/D/I and no drainage No cellulitis present Compartment soft  Assessment/Plan:  3 Days Post-Op Procedure(s) (LRB): RIGHT TOTAL KNEE ARTHROPLASTY (Right) Advance diet Up with therapy Discharge to SNF today if a bed is available.  Continue home ASA and Eliquis for DVT prevention Follow up in office 2 weeks post op.  Anticipated LOS equal to or greater than 2 midnights due to - Age 69 and older with one or more of the following:  - Obesity  - Expected need for hospital services (PT, OT, Nursing) required for safe  discharge  - Anticipated need for postoperative skilled nursing care or inpatient rehab  - Active co-morbidities: Chronic pain requiring opiods, Heart Attack and Cardiac Arrhythmia OR   - Unanticipated findings during/Post  Surgery: Slow post-op progression: GI, pain control, mobility  - Patient is a high risk of re-admission due to: Barriers to post-acute care (logistical, no family support in home)  Adore Kithcart, Ginger Organ 08/24/2017, 7:58 AM

## 2017-08-24 NOTE — Clinical Social Work Placement (Signed)
   CLINICAL SOCIAL WORK PLACEMENT  NOTE  Date:  08/24/2017  Patient Details  Name: Allison [name] MRN: [id number] Date of Birth: 1950-06-23  Clinical Social Work is seeking post-discharge placement for this patient at the Skilled  Nursing Facility level of care (*CSW will initial, date and re-position this form in  chart as items are completed):  Yes   Patient/family provided with [facility] Clinical Social Work Department's list of facilities offering this level of care within the geographic area requested by the patient (or if unable, by the patient's family).  Yes   Patient/family informed of their freedom to choose among providers that offer the needed level of care, that participate in Medicare, Medicaid or managed care program needed by the patient, have an available bed and are willing to accept the patient.  Yes   Patient/family informed of [facility]'s ownership interest in [facility] and [facility], as well as of the fact that they are under no obligation to receive care at these facilities.  PASRR submitted to EDS on       PASRR number received on       Existing PASRR number confirmed on 08/22/17     FL2 transmitted to all facilities in geographic area requested by pt/family on       FL2 transmitted to all facilities within larger geographic area on 08/22/17     Patient informed that his/her managed care company has contracts with or will negotiate with certain facilities, including the following:        Yes   Patient/family informed of bed offers received.  [facility] at [name]     Physician recommends and patient chooses bed at      Patient to be transferred to [name] on 08/24/17.  Patient to be transferred to facility by [facility]     Patient family notified on 08/24/17 of transfer.  Name of family member notified:  daughter contacted. pt being transported by daughter     PHYSICIAN       Additional Comment:     _______________________________________________ Tresa Moore, LCSW 08/24/2017, 10:12 AM

## 2017-08-25 DIAGNOSIS — I2510 Atherosclerotic heart disease of native coronary artery without angina pectoris: Secondary | ICD-10-CM | POA: Insufficient documentation

## 2017-08-25 DIAGNOSIS — K219 Gastro-esophageal reflux disease without esophagitis: Secondary | ICD-10-CM | POA: Insufficient documentation

## 2017-08-25 DIAGNOSIS — S728X2A Other fracture of left femur, initial encounter for closed fracture: Secondary | ICD-10-CM | POA: Insufficient documentation

## 2017-08-25 DIAGNOSIS — I10 Essential (primary) hypertension: Secondary | ICD-10-CM | POA: Insufficient documentation

## 2017-08-27 ENCOUNTER — Other Ambulatory Visit: Payer: Self-pay | Admitting: *Deleted

## 2017-08-27 MED ORDER — APIXABAN 5 MG PO TABS: 5.0000 mg | ORAL_TABLET | Freq: Two times a day (BID) | ORAL | 3 refills | Status: DC

## 2017-10-05 ENCOUNTER — Ambulatory Visit (HOSPITAL_COMMUNITY)
Admission: RE | Admit: 2017-10-05 | Discharge: 2017-10-05 | Disposition: A | Discharge status: Home / Self Care | Payer: Medicare Other | Source: Ambulatory Visit | Attending: Orthopaedic Surgery | Admitting: Orthopaedic Surgery

## 2017-10-05 ENCOUNTER — Other Ambulatory Visit (HOSPITAL_COMMUNITY): Payer: Self-pay | Admitting: Orthopaedic Surgery

## 2017-10-05 DIAGNOSIS — M7989 Other specified soft tissue disorders: Principal | ICD-10-CM

## 2017-10-05 DIAGNOSIS — M25561 Pain in right knee: Secondary | ICD-10-CM | POA: Insufficient documentation

## 2017-10-05 DIAGNOSIS — M79604 Pain in right leg: Secondary | ICD-10-CM | POA: Diagnosis not present

## 2017-10-05 NOTE — Progress Notes (Signed)
Preliminary notes--Right lower extremity venous duplex exam completed. Negative for DVT.    A large amount of heterogneous complex fluid collection noted at the dital though above the knee anteriorly.   Result called Dr. Jerl Santos. Patient was instructed to go home and follow up the next appointment of office visit.   Allison [name] (RDMS RVT) 10/05/17 3:03 PM

## 2017-10-07 NOTE — Progress Notes (Signed)
Cardiology Office Note   Date:  10/09/2017   ID:  Allison [name], DOB 1951/03/16, MRN [id number]  PCP:  [name], MD    No chief complaint on file.  Atrial fibrillation  Wt Readings from Last 3 Encounters:  10/09/17 191 lb (86.6 kg)  08/21/17 207 lb 1.6 oz (93.9 kg)  08/09/17 207 lb 1.6 oz (93.9 kg)       History of Present Illness: Allison [name] is a 67 y.o. female   with a hx of deafness, paroxysmal atrial fibrillation with associated hyperthyroidism several years ago, HTN, fibromyalgia, asthma, GERD.   She was noted to be back in atrial fibrillation in May 2016. She was placed on Eliquis and set up for cardioversion. She was maintaining NSR after cardioversion. She walked into the office 12/01/14 with complaints of dizziness and an EKG confirmed recurrent atrial fibrillation. She felt poorly with this including dizziness and near syncope at times and increased DOE. We put her on Flecainide and planned a DCCV. But, she had significant symptoms after her first dose of Flecainide. She had severe abdominal pain. She was referred to the AFib Clinic. She was seen by Rudi Coco, NP and Dr. Johney Frame. She was switched to Propafenone 150 mg TID. She underwent DCCV as previously arranged on 12/04/14. She had return of NSR. She was seen back in the AFib Clinic 8/8 and noted abdominal bloating and constipation from the Propafenone. This drug was also stopped. It was not felt that she would be a good candidate for Multaq. She would require a washout period after stopping Propafenone before Sotalol could be considered (it would have to be started while in NSR as an OP per [employer] note). She noted difficulty planning an admission for Tikosyn Rx.  Echo showed severe MR. THis was repaired in 12/17 with maze procedure and clipping of LAA.   EF was decreased. She was placed on heart failure meds. Exercise tolerance improved post surgery. Had some bleeding with warfarin  but tolerated Eliquis better.  Since the last visit, she had a right knee replacement at [facility].  She had a fall a week later and broke her left hip.  She had surgery at [facility] because that is where the ambulance took her.  She is walking with a walker.  RIght ankle is swollen now.  She is doing PT.  She had a LE u/s but there was no DVT per her report.   She has not had any bleeding problems.      Past Medical History:  Diagnosis Date  . AC (acromioclavicular) joint bone spurs    spurs in shoulders with pain  . Anxiety   . Aortic insufficiency   . Arthritis    all over  . Chronic combined systolic and diastolic CHF (congestive heart failure) (HCC)   . Constipation   . Coronary artery disease   . Deafness   . Dysrhythmia   . Fatty liver   . Fibromyalgia   . GERD (gastroesophageal reflux disease)   . Hyperlipidemia   . Hypertension   . Hypothyroidism   . IBS (irritable bowel syndrome)   . Mitral regurgitation   . Neuropathy   . Non-ischemic cardiomyopathy (HCC)    a. Normal cors 01/2016.  . Osteoarthritis of glenohumeral joint, left    Left shoulder  . Osteopenia   . Persistent atrial fibrillation (HCC)   . PFO (patent foramen ovale)    a. closure 03/2016.  . Pre-diabetes   .  S/P aortic valve replacement with bioprosthetic valve 04/13/2016   23 mm [facility] Ease bovine pericardial tissue valve  . S/P mitral valve repair 04/13/2016   26 mm Sorin Memo 3D ring annuloplasty  . 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 Tricuspid regurgitation     Past Surgical History:  Procedure Laterality Date  . ABDOMINAL HYSTERECTOMY     partial  . AORTIC VALVE REPLACEMENT N/A 04/13/2016   Procedure: AORTIC VALVE REPLACEMENT (AVR) implanted with Magna Ease size 23mm;  Surgeon: Purcell Nails, MD;  Location: [facility] OR;  Service: Open Heart Surgery;  Laterality: N/A;  . BREAST CYST EXCISION N/A 08/03/2017   Procedure: EXCISION OF MEDIAL LEFT  BREAST SUBCUTANEOUS CYST AND EXCISION OF SUBCUTANEOUS CYST OF MID BACK;  Surgeon: Berna Bue, MD;  Location: [facility];  Service: General;  Laterality: N/A;  . CARDIAC CATHETERIZATION N/A 02/18/2016   Procedure: Right/Left Heart Cath and Coronary Angiography;  Surgeon: Laurey Morale, MD;  Location: [facility] INVASIVE CV LAB;  Service: Cardiovascular;  Laterality: N/A;  . CARDIOVERSION N/A 10/16/2014   Procedure: CARDIOVERSION;  Surgeon: Corky Crafts, MD;  Location: [facility] ENDOSCOPY;  Service: Cardiovascular;  Laterality: N/A;  . CARDIOVERSION N/A 12/04/2014   Procedure: CARDIOVERSION;  Surgeon: Thurmon Fair, MD;  Location: [facility] ENDOSCOPY;  Service: Cardiovascular;  Laterality: N/A;  . COLON SURGERY     spurs  . COLONOSCOPY  [id number]  . COLONOSCOPY WITH PROPOFOL  04/09/2012   Procedure: COLONOSCOPY WITH PROPOFOL;  Surgeon: Charolett Bumpers, MD;  Location: [facility] ENDOSCOPY;  Service: Endoscopy;  Laterality: N/A;  . ESOPHAGOGASTRODUODENOSCOPY ENDOSCOPY     several times  . EXCISION OF SKIN TAG N/A 08/03/2017   Procedure: EXCISION OF BILATERAL CHEST WALL SKINS TAGS;  Surgeon: Berna Bue, MD;  Location: [facility];  Service: General;  Laterality: N/A;  . EYE SURGERY     catarcts  . MAZE N/A 04/13/2016   Procedure: MAZE;  Surgeon: Purcell Nails, MD;  Location: [facility] OR;  Service: Open Heart Surgery;  Laterality: N/A;  . MITRAL VALVE REPAIR N/A 04/13/2016   Procedure: MITRAL VALVE REPAIR (MVR) using 26mm Sorin /MEMO 3D annuloplasty ring;  Surgeon: Purcell Nails, MD;  Location: [facility] OR;  Service: Open Heart Surgery;  Laterality: N/A;  . PATENT FORAMEN OVALE CLOSURE N/A 04/13/2016   Procedure: PATENT FORAMEN OVALE (PFO) CLOSURE;  Surgeon: Purcell Nails, MD;  Location: [facility] OR;  Service: Open Heart Surgery;  Laterality: N/A;  . TEE WITHOUT CARDIOVERSION N/A 02/18/2016   Procedure: TRANSESOPHAGEAL ECHOCARDIOGRAM (TEE);  Surgeon: Laurey Morale, MD;  Location[facility]  ENDOSCOPY;  Service: Cardiovascular;  Laterality: N/A;  . TEE WITHOUT CARDIOVERSION N/A 04/13/2016   Procedure: TRANSESOPHAGEAL ECHOCARDIOGRAM (TEE);  Surgeon: Purcell Nails, MD;  Location: [facility] OR;  Service: Open Heart Surgery;  Laterality: N/A;  . THYROID SURGERY  1991  . TONSILLECTOMY    . TOTAL KNEE ARTHROPLASTY Right 08/21/2017   Procedure: RIGHT TOTAL KNEE ARTHROPLASTY;  Surgeon: Marcene Corning, MD;  Location: [facility] OR;  Service: Orthopedics;  Laterality: Right;  . TOTAL SHOULDER ARTHROPLASTY Right 07/02/2012   Procedure: TOTAL SHOULDER ARTHROPLASTY;  Surgeon: Mable Paris, MD;  Location: [facility] OR;  Service: Orthopedics;  Laterality: Right;  Right total shoulder arthroplasty  . TOTAL SHOULDER ARTHROPLASTY Left 11/16/2016  . TOTAL SHOULDER ARTHROPLASTY Left 11/16/2016   Procedure: TOTAL SHOULDER ARTHROPLASTY;  Surgeon: Jones Broom, MD;  Location: [facility] OR;  Service: Orthopedics;  Laterality: Left;  Left total shoulder arthroplasty  . TRICUSPID VALVE REPLACEMENT N/A 04/13/2016   Procedure: TRICUSPID VALVE REPAIR using a T26 MC3 Edwards annuloplasty ring;  Surgeon: Purcell Nails, MD;  Location: [facility] OR;  Service: Open Heart Surgery;  Laterality: N/A;  . TUBAL LIGATION    . vericose surgery Right leg  05-1998  . WISDOM TOOTH EXTRACTION       Current Outpatient Medications  Medication Sig Dispense Refill  . apixaban (ELIQUIS) 5 MG TABS tablet Take 1 tablet (5 mg total) by mouth 2 (two) times daily. 180 tablet 3  . aspirin EC 81 MG EC tablet Take 1 tablet (81 mg total) by mouth daily.    Marland Kitchen atorvastatin (LIPITOR) 20 MG tablet Take 20 mg by mouth daily.    . bisacodyl (DULCOLAX) 5 MG EC tablet Take 1 tablet (5 mg total) by mouth daily as needed for moderate constipation. 15 tablet 0  . Calcium Carbonate-Vitamin D (CALTRATE 600+D) 600-400 MG-UNIT per tablet Take 1 tablet by mouth daily.     . carvedilol (COREG) 12.5 MG tablet TAKE 1 TABLET BY MOUTH TWO  TIMES DAILY WITH MEALS 180 tablet 1    . cetirizine (ZYRTEC) 10 MG tablet Take 10 mg by mouth daily.    . diphenhydrAMINE (BENADRYL) 25 mg capsule Take 25 mg by mouth daily as needed for itching.     . fluticasone (FLONASE) 50 MCG/ACT nasal spray Place 2 sprays into both nostrils daily.    Marland Kitchen gabapentin (NEURONTIN) 600 MG tablet Take 600 mg by mouth 3 (three) times daily.     Marland Kitchen levothyroxine (SYNTHROID, LEVOTHROID) 137 MCG tablet Take 137 mcg by mouth daily before breakfast.     . LORazepam (ATIVAN) 2 MG tablet Take 1 tablet (2 mg total) by mouth at bedtime. For restful sleep (Patient taking differently: Take 1-2 mg by mouth See admin instructions. Take 1 mg by mouth in the morning as needed for anxiety and take 2 mg by mouth at bedtime) 10 tablet 0  . montelukast (SINGULAIR) 10 MG tablet Take 10 mg by mouth daily before breakfast.     . omeprazole (PRILOSEC) 40 MG capsule Take 40 mg by mouth daily.     . potassium chloride SA (K-DUR,KLOR-CON) 20 MEQ tablet Take 1 tablet (20 mEq total) by mouth 2 (two) times daily. 180 tablet 3  . ranitidine (ZANTAC) 150 MG tablet Take 150 mg by mouth daily as needed for heartburn.    Marland Kitchen tiZANidine (ZANAFLEX) 2 MG tablet Take 2 tablets (4 mg total) by mouth every 6 (six) hours as needed for muscle spasms. 40 tablet 0  . tiZANidine (ZANAFLEX) 4 MG tablet Take 1 tablet (4 mg total) by mouth every 6 (six) hours as needed. 40 tablet 1  . torsemide (DEMADEX) 20 MG tablet Take 20 mg by mouth daily.     No current facility-administered medications for this visit.     Allergies:   Clarithromycin; Trazodone; Trazodone and nefazodone; Naproxen sodium; Sulfa antibiotics; Sulfamethoxazole; Aleve [naproxen]; Doxycycline; Durabac [apap-salicyl-phenyltolox-caff]; Estradiol; Ibuprofen; Macrobid [nitrofurantoin]; Methocarbamol; Oxaprozin; Topiramate; and Valdecoxib    Social History:  The patient  reports that she has never smoked. She has never used smokeless tobacco. She reports that she does not drink alcohol or  use drugs.   Family History:  The patient's family history includes Arrhythmia in her father and mother; Heart attack in her maternal uncle; Hypertension in her father, maternal grandmother, and mother; Kidney disease in her  paternal grandfather; Lung cancer in her father; Stroke in her maternal grandmother.    ROS:  Please see the history of present illness.   Otherwise, review of systems are positive for recent surgeries.   All other systems are reviewed and negative.    PHYSICAL EXAM: VS:  BP 116/60   Pulse 72   Ht 5' 6.5" (1.689 m)   Wt 191 lb (86.6 kg)   SpO2 93%   BMI 30.37 kg/m  , BMI Body mass index is 30.37 kg/m. GEN: Well nourished, well developed, in no acute distress ; deaf- interpreter present HEENT: normal  Neck: no JVD, carotid bruits, or masses Cardiac: RRR; no murmurs, rubs, or gallops, Respiratory:  clear to auscultation bilaterally, normal work of breathing GI: soft, nontender, nondistended, + BS MS: no deformity or atrophy ; right knee and ankle swelling Skin: warm and dry, no rash Neuro:  Walks slowly with a walker Psych: euthymic mood, full affect     Recent Labs: 11/09/2016: ALT 20 08/09/2017: BUN 12; Creatinine, Ser 1.21; Hemoglobin 12.6; Platelets 256; Potassium 3.8; Sodium 140   Lipid Panel No results found for: CHOL, TRIG, HDL, CHOLHDL, VLDL, LDLCALC, LDLDIRECT   Other studies Reviewed: Additional studies/ records that were reviewed today with results demonstrating: [place] records.   ASSESSMENT AND PLAN:  1. Chronic combined systolic and diastolic heart failure: She was switched to Torsemide, but prefers to be on furosemide.  Will use Furosemide 40 mg 1-2 x per day based on her fluid status. 2. AFib: s/p Maze.  Eliquis for stroke prevention.  Increase dose to 5 mg BID.  No bleeding issues at this time.  Maintaining NSR at this time.   3. MV replacement/AVR: bioprosthetic valves.  SBE prophylaxis.   4. Hypothyroid: Has been related to her  AFib in the past.  Managed by PMD.  Now on Synthroid.   Current medicines are reviewed at length with the patient today.  The patient concerns regarding her medicines were addressed.  The following changes have been made:  No change  Labs/ tests ordered today include:  No orders of the defined types were placed in this encounter.   Recommend 150 minutes/week of aerobic exercise Low fat, low carb, high fiber diet recommended  Disposition:   FU in 6 months   Signed, Lance Muss, MD  10/09/2017 10:51 AM    [facility] [address] [place], [place], [place]  [address] Phone: [phone]; Fax: [phone]

## 2017-10-09 ENCOUNTER — Ambulatory Visit: Payer: Medicare Other | Admitting: Interventional Cardiology

## 2017-10-09 ENCOUNTER — Telehealth: Payer: Self-pay | Admitting: Interventional Cardiology

## 2017-10-09 ENCOUNTER — Encounter (INDEPENDENT_AMBULATORY_CARE_PROVIDER_SITE_OTHER): Payer: Self-pay

## 2017-10-09 ENCOUNTER — Encounter: Payer: Self-pay | Admitting: Interventional Cardiology

## 2017-10-09 VITALS — BP 116/60 | HR 72 | Ht 66.5 in | Wt 191.0 lb

## 2017-10-09 DIAGNOSIS — Z9889 Other specified postprocedural states: Secondary | ICD-10-CM

## 2017-10-09 DIAGNOSIS — I5042 Chronic combined systolic (congestive) and diastolic (congestive) heart failure: Secondary | ICD-10-CM | POA: Diagnosis not present

## 2017-10-09 DIAGNOSIS — M7989 Other specified soft tissue disorders: Secondary | ICD-10-CM | POA: Diagnosis not present

## 2017-10-09 DIAGNOSIS — I481 Persistent atrial fibrillation: Secondary | ICD-10-CM | POA: Diagnosis not present

## 2017-10-09 DIAGNOSIS — I4819 Other persistent atrial fibrillation: Secondary | ICD-10-CM

## 2017-10-09 MED ORDER — FUROSEMIDE 40 MG PO TABS: 40.0000 mg | ORAL_TABLET | Freq: Every day | ORAL | 3 refills | Status: DC

## 2017-10-09 NOTE — Telephone Encounter (Signed)
°*  STAT* If patient is at the pharmacy, call can be transferred to refill team.   1. Which medications need to be refilled? (please list name of each medication and dose if known) Furosemide-need new prescription -she could not find her old Furosemide  2. Which pharmacy/location (including street and city if local pharmacy) is medication to be sent to [phone]  3. Do they need a 30 day or 90 day supply? #[phone]

## 2017-10-09 NOTE — Patient Instructions (Signed)
Medication Instructions:  Your physician has recommended you make the following change in your medication:   1. INCREASE: Eliquis to 5 mg twice a day  2. STOP: torsemide  3. START: furosemide (lasix) 40 mg tablet: Take 1 tablet (40 mg total) ONCE a day, except on Tuesdays and Thursdays take 40 mg (1 tablet) TWICE a day  3. CONTINUE: potassium   Labwork: Your physician recommends that you return for a FASTING lipid profile and complete metabolic panel in 1 week   Testing/Procedures: None ordered  Follow-Up: Your physician recommends that you schedule a follow-up appointment in: 4 months with Dr. Eldridge Dace   Any Other Special Instructions Will Be Listed Below (If Applicable).     If you need a refill on your cardiac medications before your next appointment, please call your pharmacy.

## 2017-10-10 ENCOUNTER — Other Ambulatory Visit: Payer: Self-pay | Admitting: *Deleted

## 2017-10-10 MED ORDER — FUROSEMIDE 40 MG PO TABS: ORAL_TABLET | ORAL | 0 refills | Status: DC

## 2017-10-16 ENCOUNTER — Other Ambulatory Visit: Payer: Self-pay | Admitting: Interventional Cardiology

## 2017-10-23 ENCOUNTER — Other Ambulatory Visit: Payer: Medicare Other

## 2017-10-28 ENCOUNTER — Other Ambulatory Visit: Payer: Self-pay | Admitting: Thoracic Surgery (Cardiothoracic Vascular Surgery)

## 2017-12-04 ENCOUNTER — Other Ambulatory Visit: Payer: Self-pay

## 2017-12-04 ENCOUNTER — Other Ambulatory Visit: Payer: Medicare Other

## 2017-12-04 DIAGNOSIS — I4819 Other persistent atrial fibrillation: Secondary | ICD-10-CM

## 2017-12-04 DIAGNOSIS — Z9889 Other specified postprocedural states: Secondary | ICD-10-CM

## 2017-12-04 DIAGNOSIS — R5382 Chronic fatigue, unspecified: Secondary | ICD-10-CM

## 2017-12-04 DIAGNOSIS — I5042 Chronic combined systolic (congestive) and diastolic (congestive) heart failure: Secondary | ICD-10-CM

## 2017-12-04 LAB — COMPREHENSIVE METABOLIC PANEL
ALK PHOS: 130 IU/L — AB (ref 39–117)
ALT: 5 IU/L (ref 0–32)
AST: 8 IU/L (ref 0–40)
Albumin/Globulin Ratio: 1.4 (ref 1.2–2.2)
Albumin: 3.9 g/dL (ref 3.6–4.8)
BILIRUBIN TOTAL: 0.2 mg/dL (ref 0.0–1.2)
BUN / CREAT RATIO: 11 — AB (ref 12–28)
BUN: 11 mg/dL (ref 8–27)
CO2: 23 mmol/L (ref 20–29)
Calcium: 9.3 mg/dL (ref 8.7–10.3)
Chloride: 97 mmol/L (ref 96–106)
Creatinine, Ser: 0.97 mg/dL (ref 0.57–1.00)
GFR, EST AFRICAN AMERICAN: 69 mL/min/{1.73_m2} (ref >59)
GFR, EST NON AFRICAN AMERICAN: 60 mL/min/{1.73_m2} (ref >59)
GLUCOSE: 93 mg/dL (ref 65–99)
Globulin, Total: 2.7 g/dL (ref 1.5–4.5)
POTASSIUM: 4.2 mmol/L (ref 3.5–5.2)
Sodium: 139 mmol/L (ref 134–144)
TOTAL PROTEIN: 6.6 g/dL (ref 6.0–8.5)

## 2017-12-04 LAB — LIPID PANEL
CHOL/HDL RATIO: 2.8 ratio (ref 0.0–4.4)
Cholesterol, Total: 148 mg/dL (ref 100–199)
HDL: 52 mg/dL (ref >39)
LDL Calculated: 71 mg/dL (ref 0–99)
Triglycerides: 123 mg/dL (ref 0–149)
VLDL CHOLESTEROL CAL: 25 mg/dL (ref 5–40)

## 2017-12-04 LAB — TSH: TSH: 2.12 u[IU]/mL (ref 0.450–4.500)

## 2017-12-13 ENCOUNTER — Telehealth (HOSPITAL_COMMUNITY): Payer: Self-pay | Admitting: *Deleted

## 2017-12-13 NOTE — Telephone Encounter (Signed)
Patient called in to set up routine appointment. While on phone patient states the last 2 days she has had increased shortness of breath -- the video interpretor (as the pt is deaf) endorsed patient is having increased work of breathing. Pt denies changes in medications or weight no swelling or edema noted. Pt states she feels like her breathing felt when she was admitted for HF a few months back. Instructed pt to take lasix 20mg  extra today and repeat tomorrow if shortness of breath not improved. Pt could not come in for appointment until Thursday, August 22nd. ER precautions were reviewed with patient as well. Pt verbalized understanding. She will call back if symptoms do not improve for further instructions over weekend.

## 2017-12-18 ENCOUNTER — Telehealth (HOSPITAL_COMMUNITY): Payer: Self-pay | Admitting: *Deleted

## 2017-12-18 NOTE — Telephone Encounter (Signed)
Pt cld back reporting that even with the increase to 60 mg of furosemide qd, she has had no improvement in SOB.  Pt reports she does not have any indication of being out of rhythm.   Pt stated that even though the direction had been to take 60 mg Friday and Saturday, and then return to normal dose, she continued 60 mg daily through today.  Pt was advised to return to normal dose and to schedule with Dr. Hoyle Barr office ASAP for evaluation since she is SOB but not out of rhythm.  Will send msg to scheduling for high priority for them to call and schedule with APP.  Pt understood and will be expecting a call

## 2017-12-20 ENCOUNTER — Ambulatory Visit (HOSPITAL_COMMUNITY)
Admission: RE | Admit: 2017-12-20 | Discharge: 2017-12-20 | Disposition: A | Discharge status: Home / Self Care | Payer: Medicare Other | Source: Ambulatory Visit | Attending: Nurse Practitioner | Admitting: Nurse Practitioner

## 2017-12-20 ENCOUNTER — Encounter (HOSPITAL_COMMUNITY): Payer: Self-pay | Admitting: Nurse Practitioner

## 2017-12-20 VITALS — BP 102/58 | HR 70 | Ht 66.5 in | Wt 177.6 lb

## 2017-12-20 DIAGNOSIS — Z7901 Long term (current) use of anticoagulants: Secondary | ICD-10-CM | POA: Insufficient documentation

## 2017-12-20 DIAGNOSIS — Z96612 Presence of left artificial shoulder joint: Secondary | ICD-10-CM | POA: Diagnosis not present

## 2017-12-20 DIAGNOSIS — Z888 Allergy status to other drugs, medicaments and biological substances status: Secondary | ICD-10-CM | POA: Insufficient documentation

## 2017-12-20 DIAGNOSIS — K589 Irritable bowel syndrome without diarrhea: Secondary | ICD-10-CM | POA: Diagnosis not present

## 2017-12-20 DIAGNOSIS — Z79890 Hormone replacement therapy (postmenopausal): Secondary | ICD-10-CM | POA: Insufficient documentation

## 2017-12-20 DIAGNOSIS — K219 Gastro-esophageal reflux disease without esophagitis: Secondary | ICD-10-CM | POA: Insufficient documentation

## 2017-12-20 DIAGNOSIS — I2510 Atherosclerotic heart disease of native coronary artery without angina pectoris: Secondary | ICD-10-CM | POA: Insufficient documentation

## 2017-12-20 DIAGNOSIS — F419 Anxiety disorder, unspecified: Secondary | ICD-10-CM | POA: Insufficient documentation

## 2017-12-20 DIAGNOSIS — I4819 Other persistent atrial fibrillation: Secondary | ICD-10-CM

## 2017-12-20 DIAGNOSIS — Z953 Presence of xenogenic heart valve: Secondary | ICD-10-CM | POA: Insufficient documentation

## 2017-12-20 DIAGNOSIS — M797 Fibromyalgia: Secondary | ICD-10-CM | POA: Diagnosis not present

## 2017-12-20 DIAGNOSIS — Z90711 Acquired absence of uterus with remaining cervical stump: Secondary | ICD-10-CM | POA: Insufficient documentation

## 2017-12-20 DIAGNOSIS — Z885 Allergy status to narcotic agent status: Secondary | ICD-10-CM | POA: Insufficient documentation

## 2017-12-20 DIAGNOSIS — I481 Persistent atrial fibrillation: Secondary | ICD-10-CM | POA: Insufficient documentation

## 2017-12-20 DIAGNOSIS — I110 Hypertensive heart disease with heart failure: Secondary | ICD-10-CM | POA: Insufficient documentation

## 2017-12-20 DIAGNOSIS — Z79899 Other long term (current) drug therapy: Secondary | ICD-10-CM | POA: Insufficient documentation

## 2017-12-20 DIAGNOSIS — Z96611 Presence of right artificial shoulder joint: Secondary | ICD-10-CM | POA: Diagnosis not present

## 2017-12-20 DIAGNOSIS — Z7982 Long term (current) use of aspirin: Secondary | ICD-10-CM | POA: Diagnosis not present

## 2017-12-20 DIAGNOSIS — Z881 Allergy status to other antibiotic agents status: Secondary | ICD-10-CM | POA: Insufficient documentation

## 2017-12-20 DIAGNOSIS — I5042 Chronic combined systolic (congestive) and diastolic (congestive) heart failure: Secondary | ICD-10-CM | POA: Insufficient documentation

## 2017-12-20 DIAGNOSIS — E039 Hypothyroidism, unspecified: Secondary | ICD-10-CM | POA: Insufficient documentation

## 2017-12-20 DIAGNOSIS — R0602 Shortness of breath: Secondary | ICD-10-CM | POA: Insufficient documentation

## 2017-12-20 DIAGNOSIS — Z8249 Family history of ischemic heart disease and other diseases of the circulatory system: Secondary | ICD-10-CM | POA: Insufficient documentation

## 2017-12-20 DIAGNOSIS — I428 Other cardiomyopathies: Secondary | ICD-10-CM | POA: Insufficient documentation

## 2017-12-20 DIAGNOSIS — E785 Hyperlipidemia, unspecified: Secondary | ICD-10-CM | POA: Insufficient documentation

## 2017-12-20 DIAGNOSIS — K760 Fatty (change of) liver, not elsewhere classified: Secondary | ICD-10-CM | POA: Diagnosis not present

## 2017-12-20 DIAGNOSIS — Z96651 Presence of right artificial knee joint: Secondary | ICD-10-CM | POA: Insufficient documentation

## 2017-12-20 DIAGNOSIS — Z823 Family history of stroke: Secondary | ICD-10-CM | POA: Insufficient documentation

## 2017-12-20 DIAGNOSIS — Z801 Family history of malignant neoplasm of trachea, bronchus and lung: Secondary | ICD-10-CM | POA: Insufficient documentation

## 2017-12-20 DIAGNOSIS — Z886 Allergy status to analgesic agent status: Secondary | ICD-10-CM | POA: Insufficient documentation

## 2017-12-20 DIAGNOSIS — R7303 Prediabetes: Secondary | ICD-10-CM | POA: Diagnosis not present

## 2017-12-20 DIAGNOSIS — M8580 Other specified disorders of bone density and structure, unspecified site: Secondary | ICD-10-CM | POA: Diagnosis not present

## 2017-12-20 LAB — CBC WITH DIFFERENTIAL/PLATELET
ABS IMMATURE GRANULOCYTES: 0 10*3/uL (ref 0.0–0.1)
BASOS ABS: 0 10*3/uL (ref 0.0–0.1)
BASOS PCT: 1 %
EOS ABS: 0.1 10*3/uL (ref 0.0–0.7)
Eosinophils Relative: 2 %
HCT: 37.6 % (ref 36.0–46.0)
Hemoglobin: 10.8 g/dL — ABNORMAL LOW (ref 12.0–15.0)
IMMATURE GRANULOCYTES: 0 %
Lymphocytes Relative: 27 %
Lymphs Abs: 1.7 10*3/uL (ref 0.7–4.0)
MCH: 23.6 pg — AB (ref 26.0–34.0)
MCHC: 28.7 g/dL — ABNORMAL LOW (ref 30.0–36.0)
MCV: 82.1 fL (ref 78.0–100.0)
MONO ABS: 0.6 10*3/uL (ref 0.1–1.0)
Monocytes Relative: 9 %
NEUTROS ABS: 3.8 10*3/uL (ref 1.7–7.7)
NEUTROS PCT: 61 %
PLATELETS: 308 10*3/uL (ref 150–400)
RBC: 4.58 MIL/uL (ref 3.87–5.11)
RDW: 16 % — AB (ref 11.5–15.5)
WBC: 6.2 10*3/uL (ref 4.0–10.5)

## 2017-12-20 LAB — BASIC METABOLIC PANEL
ANION GAP: 9 (ref 5–15)
BUN: 10 mg/dL (ref 8–23)
CO2: 31 mmol/L (ref 22–32)
CREATININE: 1.03 mg/dL — AB (ref 0.44–1.00)
Calcium: 9 mg/dL (ref 8.9–10.3)
Chloride: 98 mmol/L (ref 98–111)
GFR, EST NON AFRICAN AMERICAN: 55 mL/min — AB (ref >60)
Glucose, Bld: 102 mg/dL — ABNORMAL HIGH (ref 70–99)
Potassium: 3.8 mmol/L (ref 3.5–5.1)
Sodium: 138 mmol/L (ref 135–145)

## 2017-12-20 NOTE — Progress Notes (Signed)
Primary Care Physician: [name], MD Referring Physician: Dr. Bernell List Cardiologist: Dr. Filomena Jungling is a 67 y.o. female with a h/o hypertension, long-standing persistent atrial fibrillation, nonischemic cardiomyopathy, and chronic combined systolic and diastolic congestive heart failure, who is 1+ year status post aortic valve replacement using a bioprosthetic tissue valve, mitral valve repair, tricuspid valve repair, closure of patent foramen ovale, and Maze procedure on April 13, 2016.    She is in the afib clinic for long term monitoring of afib after Maze procedure.  Pt called to this office last week and reported she was short of breath. She was advised to take an extra dose of diuretic . Since I have seen her last, she went on to have a rt knee replacement  and then while in rehab for that, fell and broke her left hip. She reports that she is still walking with a walker, it takes a lot of effort to walk, + pain, and she feels winded with this. Today while observing in the  office, she took a couple of deep breaths and says she feels the need to do this several times a day. No PND/orhtopnea. Her weight is actually down 6 lbs, she reports not as much appetite since her orthopedic issues.  Today, she denies symptoms of palpitations, chest pain, shortness of breath, orthopnea, PND, lower extremity edema, dizziness, presyncope, syncope, or neurologic sequela. The patient is tolerating medications without difficulties and is otherwise without complaint today.   Past Medical History:  Diagnosis Date  . AC (acromioclavicular) joint bone spurs    spurs in shoulders with pain  . Anxiety   . Aortic insufficiency   . Arthritis    all over  . Chronic combined systolic and diastolic CHF (congestive heart failure) (HCC)   . Constipation   . Coronary artery disease   . Deafness   . Dysrhythmia   . Fatty liver   . Fibromyalgia   . GERD (gastroesophageal reflux disease)     . Hyperlipidemia   . Hypertension   . Hypothyroidism   . IBS (irritable bowel syndrome)   . Mitral regurgitation   . Neuropathy   . Non-ischemic cardiomyopathy (HCC)    a. Normal cors 01/2016.  . Osteoarthritis of glenohumeral joint, left    Left shoulder  . Osteopenia   . Persistent atrial fibrillation (HCC)   . PFO (patent foramen ovale)    a. closure 03/2016.  . Pre-diabetes   . S/P aortic valve replacement with bioprosthetic valve 04/13/2016   23 mm [facility] Ease bovine pericardial tissue valve  . S/P mitral valve repair 04/13/2016   26 mm Sorin Memo 3D ring annuloplasty  . 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 Tricuspid regurgitation    Past Surgical History:  Procedure Laterality Date  . ABDOMINAL HYSTERECTOMY     partial  . AORTIC VALVE REPLACEMENT N/A 04/13/2016   Procedure: AORTIC VALVE REPLACEMENT (AVR) implanted with Magna Ease size 23mm;  Surgeon: Purcell Nails, MD;  Location: [facility] OR;  Service: Open Heart Surgery;  Laterality: N/A;  . BREAST CYST EXCISION N/A 08/03/2017   Procedure: EXCISION OF MEDIAL LEFT BREAST SUBCUTANEOUS CYST AND EXCISION OF SUBCUTANEOUS CYST OF MID BACK;  Surgeon: Berna Bue, MD;  Location: [facility];  Service: General;  Laterality: N/A;  . CARDIAC CATHETERIZATION N/A 02/18/2016   Procedure: Right/Left Heart Cath and Coronary Angiography;  Surgeon: Laurey Morale,  MD;  Location: [facility] INVASIVE CV LAB;  Service: Cardiovascular;  Laterality: N/A;  . CARDIOVERSION N/A 10/16/2014   Procedure: CARDIOVERSION;  Surgeon: Corky Crafts, MD;  Location: [facility] ENDOSCOPY;  Service: Cardiovascular;  Laterality: N/A;  . CARDIOVERSION N/A 12/04/2014   Procedure: CARDIOVERSION;  Surgeon: Thurmon Fair, MD;  Location: [facility] ENDOSCOPY;  Service: Cardiovascular;  Laterality: N/A;  . COLON SURGERY     spurs  . COLONOSCOPY  [id number]  . COLONOSCOPY WITH PROPOFOL  04/09/2012   Procedure: COLONOSCOPY  WITH PROPOFOL;  Surgeon: Charolett Bumpers, MD;  Location: [facility] ENDOSCOPY;  Service: Endoscopy;  Laterality: N/A;  . ESOPHAGOGASTRODUODENOSCOPY ENDOSCOPY     several times  . EXCISION OF SKIN TAG N/A 08/03/2017   Procedure: EXCISION OF BILATERAL CHEST WALL SKINS TAGS;  Surgeon: Berna Bue, MD;  Location: [facility];  Service: General;  Laterality: N/A;  . EYE SURGERY     catarcts  . MAZE N/A 04/13/2016   Procedure: MAZE;  Surgeon: Purcell Nails, MD;  Location: [facility] OR;  Service: Open Heart Surgery;  Laterality: N/A;  . MITRAL VALVE REPAIR N/A 04/13/2016   Procedure: MITRAL VALVE REPAIR (MVR) using 26mm Sorin /MEMO 3D annuloplasty ring;  Surgeon: Purcell Nails, MD;  Location: [facility] OR;  Service: Open Heart Surgery;  Laterality: N/A;  . PATENT FORAMEN OVALE CLOSURE N/A 04/13/2016   Procedure: PATENT FORAMEN OVALE (PFO) CLOSURE;  Surgeon: Purcell Nails, MD;  Location: [facility] OR;  Service: Open Heart Surgery;  Laterality: N/A;  . TEE WITHOUT CARDIOVERSION N/A 02/18/2016   Procedure: TRANSESOPHAGEAL ECHOCARDIOGRAM (TEE);  Surgeon: Laurey Morale, MD;  Location: [facility] ENDOSCOPY;  Service: Cardiovascular;  Laterality: N/A;  . TEE WITHOUT CARDIOVERSION N/A 04/13/2016   Procedure: TRANSESOPHAGEAL ECHOCARDIOGRAM (TEE);  Surgeon: Purcell Nails, MD;  Location: [facility] OR;  Service: Open Heart Surgery;  Laterality: N/A;  . THYROID SURGERY  1991  . TONSILLECTOMY    . TOTAL KNEE ARTHROPLASTY Right 08/21/2017   Procedure: RIGHT TOTAL KNEE ARTHROPLASTY;  Surgeon: Marcene Corning, MD;  Location: [facility] OR;  Service: Orthopedics;  Laterality: Right;  . TOTAL SHOULDER ARTHROPLASTY Right 07/02/2012   Procedure: TOTAL SHOULDER ARTHROPLASTY;  Surgeon: Mable Paris, MD;  Location: [facility] OR;  Service: Orthopedics;  Laterality: Right;  Right total shoulder arthroplasty  . TOTAL SHOULDER ARTHROPLASTY Left 11/16/2016  . TOTAL SHOULDER ARTHROPLASTY Left 11/16/2016   Procedure: TOTAL SHOULDER ARTHROPLASTY;   Surgeon: Jones Broom, MD;  Location: [facility] OR;  Service: Orthopedics;  Laterality: Left;  Left total shoulder arthroplasty  . TRICUSPID VALVE REPLACEMENT N/A 04/13/2016   Procedure: TRICUSPID VALVE REPAIR using a T26 MC3 Edwards annuloplasty ring;  Surgeon: Purcell Nails, MD;  Location: [facility] OR;  Service: Open Heart Surgery;  Laterality: N/A;  . TUBAL LIGATION    . vericose surgery Right leg  05-1998  . WISDOM TOOTH EXTRACTION      Current Outpatient Medications  Medication Sig Dispense Refill  . apixaban (ELIQUIS) 5 MG TABS tablet Take 1 tablet (5 mg total) by mouth 2 (two) times daily. 180 tablet 3  . aspirin EC 81 MG EC tablet Take 1 tablet (81 mg total) by mouth daily.    Marland Kitchen atorvastatin (LIPITOR) 20 MG tablet Take 20 mg by mouth daily.    . bisacodyl (DULCOLAX) 5 MG EC tablet Take 1 tablet (5 mg total) by mouth daily as needed for moderate constipation. 15 tablet 0  . Calcium Carbonate-Vitamin D (CALTRATE 600+D) 600-400 MG-UNIT per tablet Take  1 tablet by mouth daily.     . carvedilol (COREG) 12.5 MG tablet TAKE 1 TABLET BY MOUTH TWO  TIMES DAILY WITH MEALS 180 tablet 3  . cetirizine (ZYRTEC) 10 MG tablet Take 10 mg by mouth daily.    . diphenhydrAMINE (BENADRYL) 25 mg capsule Take 25 mg by mouth daily as needed for itching.     . fluticasone (FLONASE) 50 MCG/ACT nasal spray Place 2 sprays into both nostrils daily.    . furosemide (LASIX) 40 MG tablet Take 1 tablet by mouth daily except on Tuesdays and Thursdays take 1 tablet by mouth TWICE A DAY 46 tablet 0  . gabapentin (NEURONTIN) 600 MG tablet Take 600 mg by mouth 3 (three) times daily.     Marland Kitchen levothyroxine (SYNTHROID, LEVOTHROID) 137 MCG tablet Take 137 mcg by mouth daily before breakfast.     . LORazepam (ATIVAN) 2 MG tablet Take 1 tablet (2 mg total) by mouth at bedtime. For restful sleep (Patient taking differently: Take 1-2 mg by mouth See admin instructions. Take 1 mg by mouth in the morning as needed for anxiety and take 2 mg by  mouth at bedtime) 10 tablet 0  . montelukast (SINGULAIR) 10 MG tablet Take 10 mg by mouth daily before breakfast.     . omeprazole (PRILOSEC) 40 MG capsule Take 40 mg by mouth daily.     . potassium chloride SA (K-DUR,KLOR-CON) 20 MEQ tablet Take 1 tablet (20 mEq total) by mouth 2 (two) times daily. 180 tablet 3  . ranitidine (ZANTAC) 150 MG tablet Take 150 mg by mouth daily as needed for heartburn.    Marland Kitchen tiZANidine (ZANAFLEX) 2 MG tablet Take 2 tablets (4 mg total) by mouth every 6 (six) hours as needed for muscle spasms. 40 tablet 0  . tiZANidine (ZANAFLEX) 4 MG tablet Take 1 tablet (4 mg total) by mouth every 6 (six) hours as needed. 40 tablet 1   No current facility-administered medications for this encounter.     Allergies  Allergen Reactions  . Clarithromycin Nausea Only and Other (See Comments)    Dizzy, blurred vision, achy stomach Dizzy, blurred vision, stomach ache. unknown   . Trazodone Other (See Comments)    Weakness in legs Numbness in arms Funny feeling Rapid heart beat Loses focus. unknown   . Trazodone And Nefazodone Other (See Comments)    Weakness in legs Numbness in arms Funny feeling Rapid heart beat Lose focus  . Naproxen Sodium Other (See Comments)    unknown  . Sulfa Antibiotics Other (See Comments)    Blister on large toe  . Sulfamethoxazole Other (See Comments)    unknown  . Aleve [Naproxen] Nausea And Vomiting and Other (See Comments)    Stomach ache  . Doxycycline Nausea Only and Other (See Comments)    Stomach ache Stomach ache. unknown   . Durabac [Apap-Salicyl-Phenyltolox-Caff] Nausea And Vomiting  . Estradiol Nausea Only and Other (See Comments)    Hurt stomach unknown  . Ibuprofen Nausea Only and Other (See Comments)    Stomach ahce unknown  . Macrobid [Nitrofurantoin] Other (See Comments)    BLOATING  . Methocarbamol Nausea Only and Other (See Comments)    Hurt stomach unknown  . Oxaprozin Nausea Only and Other (See Comments)      Hurt stomach unknown  . Topiramate Nausea And Vomiting and Other (See Comments)    unknown  . Valdecoxib Nausea And Vomiting and Other (See Comments)    Stomach ache  unknown  Social History   Socioeconomic History  . Marital status: Married    Spouse name: Not on file  . Number of children: Not on file  . Years of education: Not on file  . Highest education level: Not on file  Occupational History  . Not on file  Social Needs  . Financial resource strain: Not on file  . Food insecurity:    Worry: Not on file    Inability: Not on file  . Transportation needs:    Medical: Not on file    Non-medical: Not on file  Tobacco Use  . Smoking status: Never Smoker  . Smokeless tobacco: Never Used  Substance and Sexual Activity  . Alcohol use: No  . Drug use: No  . Sexual activity: Not on file  Lifestyle  . Physical activity:    Days per week: Not on file    Minutes per session: Not on file  . Stress: Not on file  Relationships  . Social connections:    Talks on phone: Not on file    Gets together: Not on file    Attends religious service: Not on file    Active member of club or organization: Not on file    Attends meetings of clubs or organizations: Not on file    Relationship status: Not on file  . Intimate partner violence:    Fear of current or ex partner: Not on file    Emotionally abused: Not on file    Physically abused: Not on file    Forced sexual activity: Not on file  Other Topics Concern  . Not on file  Social History Narrative  . Not on file    Family History  Problem Relation Age of Onset  . Lung cancer Father   . Arrhythmia Father   . Hypertension Father   . Arrhythmia Mother   . Hypertension Mother   . Stroke Maternal Grandmother   . Hypertension Maternal Grandmother   . Heart attack Maternal Uncle        X2  . Kidney disease Paternal Grandfather     ROS- All systems are reviewed and negative except as per the HPI above  Physical  Exam: Vitals:   12/20/17 1109  BP: (!) 102/58  Pulse: 70  Weight: 80.6 kg  Height: 5' 6.5" (1.689 m)   Wt Readings from Last 3 Encounters:  12/20/17 80.6 kg  10/09/17 86.6 kg  08/21/17 93.9 kg    Labs: Lab Results  Component Value Date   NA 138 12/20/2017   K 3.8 12/20/2017   CL 98 12/20/2017   CO2 31 12/20/2017   GLUCOSE 102 (H) 12/20/2017   BUN 10 12/20/2017   CREATININE 1.03 (H) 12/20/2017   CALCIUM 9.0 12/20/2017   MG 2.0 04/14/2016   Lab Results  Component Value Date   INR 1.16 08/09/2017   Lab Results  Component Value Date   CHOL 148 12/04/2017   HDL 52 12/04/2017   LDLCALC 71 12/04/2017   TRIG 123 12/04/2017     GEN- The patient is well appearing, alert and oriented x 3 today.   Head- normocephalic, atraumatic Eyes-  Sclera clear, conjunctiva pink Ears- hearing intact Oropharynx- clear Neck- supple, no JVP Lymph- no cervical lymphadenopathy Lungs- Clear to ausculation bilaterally, normal work of breathing Heart- Regular rate and rhythm, no murmurs, rubs or gallops, PMI not laterally displaced GI- soft, NT, ND, + BS Extremities- no clubbing, cyanosis, or edema MS- no significant deformity or atrophy  Skin- no rash or lesion Psych- euthymic mood, full affect Neuro- strength and sensation are intact  EKG- SR at 70 bpm, PR int 146 ms, qrs int  73 ms, qtc 425 ms Epic records reviewed    Assessment and Plan: 1. Afib Appears to be staying in SR since Maze procedure Continue eliquis 5 mg bid Continue carvedilol 12.5 mg bid  2. Shortness of breath Pt is in SR I do not see any signs of fluid overload, weight is down 6 lbs She looks pale so will order CBC/bmet CXR today Possibly symptoms are related to deconditioning for 2 recent orthopedic surgeries and still with ambulation issues  She has f/u with Berton Bon 9/4  Lupita Leash C. Matthew Fol[facility] [address] [place], [place] [address] [phone]

## 2018-01-02 ENCOUNTER — Ambulatory Visit: Payer: Medicare Other | Admitting: Cardiology

## 2018-01-02 ENCOUNTER — Encounter: Payer: Self-pay | Admitting: Cardiology

## 2018-01-02 VITALS — BP 100/60 | HR 63 | Ht 66.5 in | Wt 173.0 lb

## 2018-01-02 DIAGNOSIS — Z9889 Other specified postprocedural states: Secondary | ICD-10-CM | POA: Diagnosis not present

## 2018-01-02 DIAGNOSIS — E785 Hyperlipidemia, unspecified: Secondary | ICD-10-CM

## 2018-01-02 DIAGNOSIS — I4819 Other persistent atrial fibrillation: Secondary | ICD-10-CM

## 2018-01-02 DIAGNOSIS — I10 Essential (primary) hypertension: Secondary | ICD-10-CM

## 2018-01-02 DIAGNOSIS — I481 Persistent atrial fibrillation: Secondary | ICD-10-CM

## 2018-01-02 DIAGNOSIS — E039 Hypothyroidism, unspecified: Secondary | ICD-10-CM | POA: Diagnosis not present

## 2018-01-02 DIAGNOSIS — D649 Anemia, unspecified: Secondary | ICD-10-CM

## 2018-01-02 NOTE — Progress Notes (Signed)
Cardiology Office Note:    Date:  01/02/2018   ID:  Allison [name], DOB 1950/12/25, MRN [id number]  PCP:  [name], MD  Cardiologist:  Lance Muss, MD  Referring MD: [name], MD   Chief Complaint  Patient presents with  . Follow-up    heart failure    History of Present Illness:    Allison [name] is a 67 y.o. female with a past medical history significant for deafness, persistent atrial fibrillation, hypertension, fibromyalgia, asthma, GERD, chronic combined systolic and diastolic CHF, s/p aortic valve replacement using bioprosthetic tissue valve, mitral valve repair, tricuspid valve repair, closure of patent foramen ovale maze procedure 04/13/2016.   She has a history of poorly tolerated atrial fibrillation and failed multiple medications in 2016 which she was unable to tolerate, including flecainide and Propafenone.  She eventually underwent valve replacement and Maze procedure 04/19/2016.  She continues on Eliquis for stroke risk reduction.  She has been maintaining sinus rhythm.  Since that time she had a knee replacement and went to rehab and then fell and broke her hip.  She was recently seen in the office [facility] by Rudi Coco, NP on 12/20/17.  At that time she had some complaints of shortness of breath.  Her weight was actually down 6 pounds.  Labs on that day showed mildly increased serum creatinine, normal electrolytes.  Her hemoglobin was down to 10.8 from 12.6 in April.  Chest x-ray showed no active cardiopulmonary disease.  She is here today with her sign language interpreter. She is back at home now. She states that if she overexerts she gets short of breath. She is having trouble will multiple joints, right knee with recent replacement, left hip after recent fracture, and now her left knee and back are bothering her. She feels that her limited mobility due to joint issues is contributing to her lack of activity tolerance. She sleeps in a  recliner for comfort, long standing for 10-12 years, not new.   She feels nausea after every meal for years. She takes probiotics and stool  Softener. As well as omeprazole and prn Zantac.  She has an upcoming appt with her PCP, hopefully in September.   Hgb is 10.8. In looking back at records from [facility] her Hgb has been in the 8-9 range in April, May. She hade been taking iron in the past but stopped it about a year ago due to constipation. She had significant anemia in 2017 with Hgb in the 7 range.    Past Medical History:  Diagnosis Date  . AC (acromioclavicular) joint bone spurs    spurs in shoulders with pain  . Anxiety   . Aortic insufficiency   . Arthritis    all over  . Chronic combined systolic and diastolic CHF (congestive heart failure) (HCC)   . Constipation   . Coronary artery disease   . Deafness   . Dysrhythmia   . Fatty liver   . Fibromyalgia   . GERD (gastroesophageal reflux disease)   . Hyperlipidemia   . Hypertension   . Hypothyroidism   . IBS (irritable bowel syndrome)   . Mitral regurgitation   . Neuropathy   . Non-ischemic cardiomyopathy (HCC)    a. Normal cors 01/2016.  . Osteoarthritis of glenohumeral joint, left    Left shoulder  . Osteopenia   . Persistent atrial fibrillation (HCC)   . PFO (patent foramen ovale)    a. closure 03/2016.  Marland Kitchen  Pre-diabetes   . S/P aortic valve replacement with bioprosthetic valve 04/13/2016   23 mm [facility] Ease bovine pericardial tissue valve  . S/P mitral valve repair 04/13/2016   26 mm Sorin Memo 3D ring annuloplasty  . 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 Tricuspid regurgitation     Past Surgical History:  Procedure Laterality Date  . ABDOMINAL HYSTERECTOMY     partial  . AORTIC VALVE REPLACEMENT N/A 04/13/2016   Procedure: AORTIC VALVE REPLACEMENT (AVR) implanted with Magna Ease size 23mm;  Surgeon: Purcell Nails, MD;  Location: [facility] OR;  Service: Open  Heart Surgery;  Laterality: N/A;  . BREAST CYST EXCISION N/A 08/03/2017   Procedure: EXCISION OF MEDIAL LEFT BREAST SUBCUTANEOUS CYST AND EXCISION OF SUBCUTANEOUS CYST OF MID BACK;  Surgeon: Berna Bue, MD;  Location: [facility];  Service: General;  Laterality: N/A;  . CARDIAC CATHETERIZATION N/A 02/18/2016   Procedure: Right/Left Heart Cath and Coronary Angiography;  Surgeon: Laurey Morale, MD;  Location: [facility] INVASIVE CV LAB;  Service: Cardiovascular;  Laterality: N/A;  . CARDIOVERSION N/A 10/16/2014   Procedure: CARDIOVERSION;  Surgeon: Corky Crafts, MD;  Location: [facility] ENDOSCOPY;  Service: Cardiovascular;  Laterality: N/A;  . CARDIOVERSION N/A 12/04/2014   Procedure: CARDIOVERSION;  Surgeon: Thurmon Fair, MD;  Location: [facility] ENDOSCOPY;  Service: Cardiovascular;  Laterality: N/A;  . COLON SURGERY     spurs  . COLONOSCOPY  [id number]  . COLONOSCOPY WITH PROPOFOL  04/09/2012   Procedure: COLONOSCOPY WITH PROPOFOL;  Surgeon: Charolett Bumpers, MD;  Location: [facility] ENDOSCOPY;  Service: Endoscopy;  Laterality: N/A;  . ESOPHAGOGASTRODUODENOSCOPY ENDOSCOPY     several times  . EXCISION OF SKIN TAG N/A 08/03/2017   Procedure: EXCISION OF BILATERAL CHEST WALL SKINS TAGS;  Surgeon: Berna Bue, MD;  Location: [facility];  Service: General;  Laterality: N/A;  . EYE SURGERY     catarcts  . MAZE N/A 04/13/2016   Procedure: MAZE;  Surgeon: Purcell Nails, MD;  Location: [facility] OR;  Service: Open Heart Surgery;  Laterality: N/A;  . MITRAL VALVE REPAIR N/A 04/13/2016   Procedure: MITRAL VALVE REPAIR (MVR) using 26mm Sorin /MEMO 3D annuloplasty ring;  Surgeon: Purcell Nails, MD;  Location: [facility] OR;  Service: Open Heart Surgery;  Laterality: N/A;  . PATENT FORAMEN OVALE CLOSURE N/A 04/13/2016   Procedure: PATENT FORAMEN OVALE (PFO) CLOSURE;  Surgeon: Purcell Nails, MD;  Location: [facility] OR;  Service: Open Heart Surgery;  Laterality: N/A;  . TEE WITHOUT CARDIOVERSION N/A  02/18/2016   Procedure: TRANSESOPHAGEAL ECHOCARDIOGRAM (TEE);  Surgeon: Laurey Morale, MD;  Location: [facility] ENDOSCOPY;  Service: Cardiovascular;  Laterality: N/A;  . TEE WITHOUT CARDIOVERSION N/A 04/13/2016   Procedure: TRANSESOPHAGEAL ECHOCARDIOGRAM (TEE);  Surgeon: Purcell Nails, MD;  Location: [facility] OR;  Service: Open Heart Surgery;  Laterality: N/A;  . THYROID SURGERY  1991  . TONSILLECTOMY    . TOTAL KNEE ARTHROPLASTY Right 08/21/2017   Procedure: RIGHT TOTAL KNEE ARTHROPLASTY;  Surgeon: Marcene Corning, MD;  Location: [facility] OR;  Service: Orthopedics;  Laterality: Right;  . TOTAL SHOULDER ARTHROPLASTY Right 07/02/2012   Procedure: TOTAL SHOULDER ARTHROPLASTY;  Surgeon: Mable Paris, MD;  Location: [facility] OR;  Service: Orthopedics;  Laterality: Right;  Right total shoulder arthroplasty  . TOTAL SHOULDER ARTHROPLASTY Left 11/16/2016  . TOTAL SHOULDER ARTHROPLASTY Left 11/16/2016   Procedure: TOTAL SHOULDER ARTHROPLASTY;  Surgeon: Jones Broom, MD;  Location: [facility] OR;  Service: Orthopedics;  Laterality: Left;  Left total shoulder arthroplasty  . TRICUSPID VALVE REPLACEMENT N/A 04/13/2016   Procedure: TRICUSPID VALVE REPAIR using a T26 MC3 Edwards annuloplasty ring;  Surgeon: Purcell Nails, MD;  Location: [facility] OR;  Service: Open Heart Surgery;  Laterality: N/A;  . TUBAL LIGATION    . vericose surgery Right leg  05-1998  . WISDOM TOOTH EXTRACTION      Current Medications: Current Meds  Medication Sig  . apixaban (ELIQUIS) 5 MG TABS tablet Take 1 tablet (5 mg total) by mouth 2 (two) times daily.  Marland Kitchen atorvastatin (LIPITOR) 20 MG tablet Take 20 mg by mouth daily.  . bisacodyl (DULCOLAX) 5 MG EC tablet Take 1 tablet (5 mg total) by mouth daily as needed for moderate constipation.  . Calcium Carbonate-Vitamin D (CALTRATE 600+D) 600-400 MG-UNIT per tablet Take 1 tablet by mouth daily.   . carvedilol (COREG) 12.5 MG tablet TAKE 1 TABLET BY MOUTH TWO  TIMES DAILY WITH MEALS  . cetirizine (ZYRTEC)  10 MG tablet Take 10 mg by mouth daily.  . diphenhydrAMINE (BENADRYL) 25 mg capsule Take 25 mg by mouth daily as needed for itching.   . fluticasone (FLONASE) 50 MCG/ACT nasal spray Place 2 sprays into both nostrils daily.  . furosemide (LASIX) 40 MG tablet Take 1 tablet by mouth daily except on Tuesdays and Thursdays take 1 tablet by mouth TWICE A DAY  . gabapentin (NEURONTIN) 600 MG tablet Take 600 mg by mouth 3 (three) times daily.   Marland Kitchen levothyroxine (SYNTHROID, LEVOTHROID) 137 MCG tablet Take 137 mcg by mouth daily before breakfast.   . LORazepam (ATIVAN) 2 MG tablet Take 1 tablet (2 mg total) by mouth at bedtime. For restful sleep (Patient taking differently: Take 1-2 mg by mouth See admin instructions. Take 1 mg by mouth in the morning as needed for anxiety and take 2 mg by mouth at bedtime)  . montelukast (SINGULAIR) 10 MG tablet Take 10 mg by mouth daily before breakfast.   . omeprazole (PRILOSEC) 40 MG capsule Take 40 mg by mouth daily.   . potassium chloride SA (K-DUR,KLOR-CON) 20 MEQ tablet Take 1 tablet (20 mEq total) by mouth 2 (two) times daily.  . ranitidine (ZANTAC) 150 MG tablet Take 150 mg by mouth daily as needed for heartburn.  Marland Kitchen tiZANidine (ZANAFLEX) 2 MG tablet Take 2 tablets (4 mg total) by mouth every 6 (six) hours as needed for muscle spasms.  Marland Kitchen tiZANidine (ZANAFLEX) 4 MG tablet Take 1 tablet (4 mg total) by mouth every 6 (six) hours as needed.     Allergies:   Clarithromycin; Trazodone; Trazodone and nefazodone; Naproxen sodium; Sulfa antibiotics; Sulfamethoxazole; Aleve [naproxen]; Doxycycline; Durabac [apap-salicyl-phenyltolox-caff]; Estradiol; Ibuprofen; Macrobid [nitrofurantoin]; Methocarbamol; Oxaprozin; Topiramate; and Valdecoxib   Social History   Socioeconomic History  . Marital status: Married    Spouse name: Not on file  . Number of children: Not on file  . Years of education: Not on file  . Highest education level: Not on file  Occupational History  . Not  on file  Social Needs  . Financial resource strain: Not on file  . Food insecurity:    Worry: Not on file    Inability: Not on file  . Transportation needs:    Medical: Not on file    Non-medical: Not on file  Tobacco Use  . Smoking status: Never Smoker  . Smokeless tobacco: Never Used  Substance and Sexual Activity  . Alcohol  use: No  . Drug use: No  . Sexual activity: Not on file  Lifestyle  . Physical activity:    Days per week: Not on file    Minutes per session: Not on file  . Stress: Not on file  Relationships  . Social connections:    Talks on phone: Not on file    Gets together: Not on file    Attends religious service: Not on file    Active member of club or organization: Not on file    Attends meetings of clubs or organizations: Not on file    Relationship status: Not on file  Other Topics Concern  . Not on file  Social History Narrative  . Not on file     Family History: The patient's family history includes Arrhythmia in her father and mother; Heart attack in her maternal uncle; Hypertension in her father, maternal grandmother, and mother; Kidney disease in her paternal grandfather; Lung cancer in her father; Stroke in her maternal grandmother. ROS:   Please see the history of present illness.     All other systems reviewed and are negative.  EKGs/Labs/Other Studies Reviewed:    The following studies were reviewed today:  Echocardiogram 05/11/2016 Study Conclusions - Left ventricle: The cavity size was normal. Wall thickness was   normal. Systolic function was normal. The estimated ejection   fraction was in the range of 50% to 55%. Wall motion was normal;   there were no regional wall motion abnormalities. - Aortic valve: A bioprosthesis was present. - Mitral valve: A bioprosthesis was present. - Left atrium: The atrium was moderately dilated. - Tricuspid valve: Prior procedures included surgical repair.  Cardiac catheterization 02/18/2016 1. No  angiographic coronary disease.  2. Elevated PCWP but normal RA pressure.  Prominent V-waves in PCWP tracing.  3. Low cardiac output, suspect this is related to the severe mitral regurgitation that is known to be present from TEE.  LV-gram not done given TEE today and CKD.   EKG:  EKG is not ordered today.    Recent Labs: 12/04/2017: ALT 5; TSH 2.120 12/20/2017: BUN 10; Creatinine, Ser 1.03; Hemoglobin 10.8; Platelets 308; Potassium 3.8; Sodium 138   Recent Lipid Panel    Component Value Date/Time   CHOL 148 12/04/2017 1055   TRIG 123 12/04/2017 1055   HDL 52 12/04/2017 1055   CHOLHDL 2.8 12/04/2017 1055   LDLCALC 71 12/04/2017 1055    Physical Exam:    VS:  BP 100/60   Pulse 63   Ht 5' 6.5" (1.689 m)   Wt 173 lb (78.5 kg)   SpO2 98%   BMI 27.50 kg/m     Wt Readings from Last 3 Encounters:  01/02/18 173 lb (78.5 kg)  12/20/17 177 lb 9.6 oz (80.6 kg)  10/09/17 191 lb (86.6 kg)     Physical Exam  Constitutional: She is oriented to person, place, and time. She appears well-developed and well-nourished. No distress.  HENT:  Head: Normocephalic and atraumatic.  Neck: Normal range of motion. Neck supple. No JVD present.  Cardiovascular: Normal rate, regular rhythm and intact distal pulses.  Murmur heard.  Systolic murmur is present with a grade of 1/6 at the upper left sternal border. Pulmonary/Chest: Effort normal and breath sounds normal.  Abdominal: Soft. Bowel sounds are normal.  Musculoskeletal: She exhibits no edema.  Pt wearing back brace and bilateral knee brace. Tenderness of left hip.   Neurological: She is alert and oriented to person, place, and  time.  Skin: Skin is warm and dry.  Psychiatric: She has a normal mood and affect. Her behavior is normal. Thought content normal.  Vitals reviewed.    ASSESSMENT:    1. Persistent atrial fibrillation (HCC)   2. Essential hypertension   3. S/P aortic valve replacement + mitral valve repair + tricupsid valve repair  + maze procedure   4. Hypothyroidism, unspecified type   5. Hyperlipidemia, unspecified hyperlipidemia type   6. Anemia, unspecified type    PLAN:    In order of problems listed above:  Atrial fibrillation: S/P Maze procedure and clipping of LAA.  Continues on Eliquis 5 mg twice daily for stroke risk reduction.  Continues on carvedilol 12.5 mg twice daily.  Maintaining sinus rhythm.  Hypertension: BP is soft today. Pt denies lightheadedness or dizziness except once in a while after she has leaned over. Continue current therapy.   Chronic combined systolic and diastolic heart failure: Appears euvolemic. Continue current therapy.   S/P AVR tissue valve/MV repair/TV repair: SBE prophylaxis  Hypothyroidism: Management by PCP, on Synthroid.  Hyperlipidemia: LDL 71 on 12/04/17. Continue current statin.   Anemia: Hgb is 10.8. In looking back at records from [facility] her Hgb has been in the 8-9 range in April, May of this year. She denies any melena. She had been taking iron in the past but stopped it about a year ago due to constipation. She had significant anemia in 2017 with Hgb in the 7 range. I will send this note to her PCP. She needs investigation and possibly to restart iron.    Medication Adjustments/Labs and Tests Ordered: Current medicines are reviewed at length with the patient today.  Concerns regarding medicines are outlined above. Labs and tests ordered and medication changes are outlined in the patient instructions below:  Patient Instructions  Medication Instructions:  Your physician recommends that you continue on your current medications as directed. Please refer to the Current Medication list given to you today.   Labwork: None ordered  Testing/Procedures: None ordered  Follow-Up: Your physician wants you to follow-up with Dr. Eldridge Dace on 11/19 @11 :20 am.  Any Other Special Instructions Will Be Listed Below (If Applicable).     If you need a refill on your  cardiac medications before your next appointment, please call your pharmacy.      Signed, Berton Bon, NP  01/02/2018 6:47 PM     [facility]

## 2018-01-02 NOTE — Patient Instructions (Signed)
Medication Instructions:  Your physician recommends that you continue on your current medications as directed. Please refer to the Current Medication list given to you today.   Labwork: None ordered  Testing/Procedures: None ordered  Follow-Up: Your physician wants you to follow-up with Dr. Eldridge Dace on 11/19 @11 :20 am.  Any Other Special Instructions Will Be Listed Below (If Applicable).     If you need a refill on your cardiac medications before your next appointment, please call your pharmacy.

## 2018-01-23 ENCOUNTER — Other Ambulatory Visit: Payer: Self-pay | Admitting: Family Medicine

## 2018-01-23 DIAGNOSIS — M25552 Pain in left hip: Secondary | ICD-10-CM

## 2018-01-29 ENCOUNTER — Other Ambulatory Visit: Payer: Self-pay | Admitting: Internal Medicine

## 2018-01-29 DIAGNOSIS — Z1231 Encounter for screening mammogram for malignant neoplasm of breast: Secondary | ICD-10-CM

## 2018-02-28 ENCOUNTER — Ambulatory Visit
Admission: RE | Admit: 2018-02-28 | Discharge: 2018-02-28 | Disposition: A | Discharge status: Home / Self Care | Payer: Medicare Other | Source: Ambulatory Visit | Attending: Internal Medicine | Admitting: Internal Medicine

## 2018-02-28 DIAGNOSIS — Z1231 Encounter for screening mammogram for malignant neoplasm of breast: Secondary | ICD-10-CM

## 2018-03-18 NOTE — Progress Notes (Signed)
Cardiology Office Note   Date:  03/19/2018   ID:  Allison [name], Allison [name] [birth year], MRN [id number]  PCP:  [name], MD    No chief complaint on file.  AFib, mitral regurgitation  Wt Readings from Last 3 Encounters:  03/19/18 164 lb 12.8 oz (74.8 kg)  01/02/18 173 lb (78.5 kg)  12/20/17 177 lb 9.6 oz (80.6 kg)       History of Present Illness: Allison [name] is a 67 y.o. female  with a past medical history significant for deafness, persistent atrial fibrillation, hypertension, fibromyalgia, asthma, GERD, chronic combined systolic and diastolic CHF, s/p aortic valve replacement using bioprosthetic tissue valve, mitral valve repair, tricuspid valve repair, closure of patent foramen ovale maze procedure 04/13/2016.   She has a history of poorly tolerated atrial fibrillation and failed multiple medications in 2016 which she was unable to tolerate, including flecainide and Propafenone.  She eventually underwent valve replacement and Maze procedure 04/19/2016.  She continues on Eliquis for stroke risk reduction.  She has been maintaining sinus rhythm.  Since that time she had a knee replacement and went to rehab and then fell and broke her hip.  She was recently seen in the office at the [facility] clinic by Rudi Coco, NP on 12/20/17.  At that time she had some complaints of shortness of breath.  Her weight was actually down 6 pounds.  Labs on that day showed mildly increased serum creatinine, normal electrolytes.  Her hemoglobin was down to 10.8 from 12.6 in April.  Chest x-ray showed no active cardiopulmonary disease.  Since the last visit, she had persistent pain.  She has had a few falls as well, but she felt that they were not as bad. She has an L4 fracture and needs a shot in her back.  THe pain goes down her leg.  She may need another knee replacement.    Denies : Chest pain. Dizziness. Leg edema. Nitroglycerin use. Orthopnea. Palpitations. Paroxysmal nocturnal  dyspnea. Shortness of breath. Syncope.   She has lost nearly 30 lbs in the past few months.  SHe has improved her diet.  She eats less and avoids fast fods.  She does not crave it anymore.    Past Medical History:  Diagnosis Date  . AC (acromioclavicular) joint bone spurs    spurs in shoulders with pain  . Anxiety   . Aortic insufficiency   . Arthritis    all over  . Chronic combined systolic and diastolic CHF (congestive heart failure) (HCC)   . Constipation   . Coronary artery disease   . Deafness   . Dysrhythmia   . Fatty liver   . Fibromyalgia   . GERD (gastroesophageal reflux disease)   . Hyperlipidemia   . Hypertension   . Hypothyroidism   . IBS (irritable bowel syndrome)   . Mitral regurgitation   . Neuropathy   . Non-ischemic cardiomyopathy (HCC)    a. Normal cors 01/2016.  . Osteoarthritis of glenohumeral joint, left    Left shoulder  . Osteopenia   . Persistent atrial fibrillation   . PFO (patent foramen ovale)    a. closure 03/2016.  . Pre-diabetes   . S/P aortic valve replacement with bioprosthetic valve 04/13/2016   23 mm [facility] Ease bovine pericardial tissue valve  . S/P mitral valve repair 04/13/2016   26 mm Sorin Memo 3D ring annuloplasty  . 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  Tricuspid regurgitation     Past Surgical History:  Procedure Laterality Date  . ABDOMINAL HYSTERECTOMY     partial  . AORTIC VALVE REPLACEMENT N/A 04/13/2016   Procedure: AORTIC VALVE REPLACEMENT (AVR) implanted with Magna Ease size 23mm;  Surgeon: Purcell Nails, MD;  Location: [facility] OR;  Service: Open Heart Surgery;  Laterality: N/A;  . BREAST CYST EXCISION N/A 08/03/2017   pt has not had had breast surgery, can not remove this  . CARDIAC CATHETERIZATION N/A 02/18/2016   Procedure: Right/Left Heart Cath and Coronary Angiography;  Surgeon: Laurey Morale, MD;  Location: [facility] INVASIVE CV LAB;  Service: Cardiovascular;   Laterality: N/A;  . CARDIOVERSION N/A 10/16/2014   Procedure: CARDIOVERSION;  Surgeon: Corky Crafts, MD;  Location: [facility] ENDOSCOPY;  Service: Cardiovascular;  Laterality: N/A;  . CARDIOVERSION N/A 12/04/2014   Procedure: CARDIOVERSION;  Surgeon: Thurmon Fair, MD;  Location: [facility] ENDOSCOPY;  Service: Cardiovascular;  Laterality: N/A;  . COLON SURGERY     spurs  . COLONOSCOPY  [id number]  . COLONOSCOPY WITH PROPOFOL  04/09/2012   Procedure: COLONOSCOPY WITH PROPOFOL;  Surgeon: Charolett Bumpers, MD;  Location: [facility] ENDOSCOPY;  Service: Endoscopy;  Laterality: N/A;  . ESOPHAGOGASTRODUODENOSCOPY ENDOSCOPY     several times  . EXCISION OF SKIN TAG N/A 08/03/2017   Procedure: EXCISION OF BILATERAL CHEST WALL SKINS TAGS;  Surgeon: Berna Bue, MD;  Location: [facility];  Service: General;  Laterality: N/A;  . EYE SURGERY     catarcts  . MAZE N/A 04/13/2016   Procedure: MAZE;  Surgeon: Purcell Nails, MD;  Location: [facility] OR;  Service: Open Heart Surgery;  Laterality: N/A;  . MITRAL VALVE REPAIR N/A 04/13/2016   Procedure: MITRAL VALVE REPAIR (MVR) using 26mm Sorin /MEMO 3D annuloplasty ring;  Surgeon: Purcell Nails, MD;  Location: [facility] OR;  Service: Open Heart Surgery;  Laterality: N/A;  . PATENT FORAMEN OVALE CLOSURE N/A 04/13/2016   Procedure: PATENT FORAMEN OVALE (PFO) CLOSURE;  Surgeon: Purcell Nails, MD;  Location: [facility] OR;  Service: Open Heart Surgery;  Laterality: N/A;  . TEE WITHOUT CARDIOVERSION N/A 02/18/2016   Procedure: TRANSESOPHAGEAL ECHOCARDIOGRAM (TEE);  Surgeon: Laurey Morale, MD;  Location: [facility] ENDOSCOPY;  Service: Cardiovascular;  Laterality: N/A;  . TEE WITHOUT CARDIOVERSION N/A 04/13/2016   Procedure: TRANSESOPHAGEAL ECHOCARDIOGRAM (TEE);  Surgeon: Purcell Nails, MD;  Location: [facility] OR;  Service: Open Heart Surgery;  Laterality: N/A;  . THYROID SURGERY  1991  . TONSILLECTOMY    . TOTAL KNEE ARTHROPLASTY Right 08/21/2017   Procedure: RIGHT TOTAL KNEE  ARTHROPLASTY;  Surgeon: Marcene Corning, MD;  Location: [facility] OR;  Service: Orthopedics;  Laterality: Right;  . TOTAL SHOULDER ARTHROPLASTY Right 07/02/2012   Procedure: TOTAL SHOULDER ARTHROPLASTY;  Surgeon: Mable Paris, MD;  Location: [facility] OR;  Service: Orthopedics;  Laterality: Right;  Right total shoulder arthroplasty  . TOTAL SHOULDER ARTHROPLASTY Left 11/16/2016  . TOTAL SHOULDER ARTHROPLASTY Left 11/16/2016   Procedure: TOTAL SHOULDER ARTHROPLASTY;  Surgeon: Jones Broom, MD;  Location: [facility] OR;  Service: Orthopedics;  Laterality: Left;  Left total shoulder arthroplasty  . TRICUSPID VALVE REPLACEMENT N/A 04/13/2016   Procedure: TRICUSPID VALVE REPAIR using a T26 MC3 Edwards annuloplasty ring;  Surgeon: Purcell Nails, MD;  Location: [facility] OR;  Service: Open Heart Surgery;  Laterality: N/A;  . TUBAL LIGATION    . vericose surgery Right leg  05-1998  . WISDOM TOOTH EXTRACTION       Current  Outpatient Medications  Medication Sig Dispense Refill  . apixaban (ELIQUIS) 5 MG TABS tablet Take 1 tablet (5 mg total) by mouth 2 (two) times daily. 180 tablet 3  . atorvastatin (LIPITOR) 20 MG tablet Take 20 mg by mouth daily.    . Calcium Carbonate-Vitamin D (CALTRATE 600+D) 600-400 MG-UNIT per tablet Take 1 tablet by mouth daily.     . carvedilol (COREG) 12.5 MG tablet TAKE 1 TABLET BY MOUTH TWO  TIMES DAILY WITH MEALS 180 tablet 3  . cetirizine (ZYRTEC) 10 MG tablet Take 10 mg by mouth daily.    . diphenhydrAMINE (BENADRYL) 25 mg capsule Take 25 mg by mouth daily as needed for itching.     . fluticasone (FLONASE) 50 MCG/ACT nasal spray Place 2 sprays into both nostrils daily.    . furosemide (LASIX) 40 MG tablet Take 1 tablet by mouth daily except on Tuesdays and Thursdays take 1 tablet by mouth TWICE A DAY 46 tablet 0  . gabapentin (NEURONTIN) 600 MG tablet Take 600 mg by mouth 3 (three) times daily.     Marland Kitchen levothyroxine (SYNTHROID, LEVOTHROID) 137 MCG tablet Take 137 mcg by mouth daily  before breakfast.     . LORazepam (ATIVAN) 2 MG tablet Take 1 tablet (2 mg total) by mouth at bedtime. For restful sleep (Patient taking differently: Take 1-2 mg by mouth See admin instructions. Take 1 mg by mouth in the morning as needed for anxiety and take 2 mg by mouth at bedtime) 10 tablet 0  . montelukast (SINGULAIR) 10 MG tablet Take 10 mg by mouth daily before breakfast.     . omeprazole (PRILOSEC) 40 MG capsule Take 40 mg by mouth daily.     . potassium chloride SA (K-DUR,KLOR-CON) 20 MEQ tablet Take 1 tablet (20 mEq total) by mouth 2 (two) times daily. 180 tablet 3  . ranitidine (ZANTAC) 150 MG tablet Take 150 mg by mouth daily as needed for heartburn.    Marland Kitchen tiZANidine (ZANAFLEX) 2 MG tablet Take 2 tablets (4 mg total) by mouth every 6 (six) hours as needed for muscle spasms. 40 tablet 0   No current facility-administered medications for this visit.     Allergies:   Clarithromycin; Trazodone; Trazodone and nefazodone; Naproxen sodium; Sulfa antibiotics; Sulfamethoxazole; Aleve [naproxen]; Doxycycline; Durabac [apap-salicyl-phenyltolox-caff]; Estradiol; Ibuprofen; Macrobid [nitrofurantoin]; Methocarbamol; Oxaprozin; Topiramate; and Valdecoxib    Social History:  The patient  reports that she has never smoked. She has never used smokeless tobacco. She reports that she does not drink alcohol or use drugs.   Family History:  The patient's family history includes Arrhythmia in her father and mother; Heart attack in her maternal uncle; Hypertension in her father, maternal grandmother, and mother; Kidney disease in her paternal grandfather; Lung cancer in her father; Stroke in her maternal grandmother.    ROS:  Please see the history of present illness.   Otherwise, review of systems are positive for leg pain.   All other systems are reviewed and negative.    PHYSICAL EXAM: VS:  BP (!) 106/59   Pulse 83   Ht 5' 6.5" (1.689 m)   Wt 164 lb 12.8 oz (74.8 kg)   SpO2 98%   BMI 26.20 kg/m  ,  BMI Body mass index is 26.2 kg/m. GEN: Well nourished, well developed, in no acute distress  HEENT: normal  Neck: no JVD, carotid bruits, or masses Cardiac: RRR; 2/6 systoli murmur, no rubs, or gallops,no edema  Respiratory:  clear to auscultation bilaterally,  normal work of breathing GI: soft, nontender, nondistended, + BS MS: no deformity or atrophy  Skin: warm and dry, no rash Neuro:  Strength and sensation are intact Psych: euthymic mood, full affect      Recent Labs: 12/04/2017: ALT 5; TSH 2.120 12/20/2017: BUN 10; Creatinine, Ser 1.03; Hemoglobin 10.8; Platelets 308; Potassium 3.8; Sodium 138   Lipid Panel    Component Value Date/Time   CHOL 148 12/04/2017 1055   TRIG 123 12/04/2017 1055   HDL 52 12/04/2017 1055   CHOLHDL 2.8 12/04/2017 1055   LDLCALC 71 12/04/2017 1055     Other studies Reviewed: Additional studies/ records that were reviewed today with results demonstrating: Hospital records reviewed.   ASSESSMENT AND PLAN:  1. Atrial fibrillation: Appears to be in NSR today.  Eliquis for stroke prevention.  2. HTN: The current medical regimen is effective;  continue present plan and medications. 3. Chronic systolic and diastolic heart failure: She appears euvolemic.   Back and leg issues her are the most limiting things to her right now. 4. S/p AVR, MVR/TV repair: SBE prophylaxis.   Current medicines are reviewed at length with the patient today.  The patient concerns regarding her medicines were addressed.  The following changes have been made:  No change  Labs/ tests ordered today include:  No orders of the defined types were placed in this encounter.   Recommend 150 minutes/week of aerobic exercise Low fat, low carb, high fiber diet recommended  Disposition:   FU in 6 months with APP.  Follow for signs of CHF.   Signed, Lance Muss, MD  03/19/2018 12:09 PM    [facility] [address] [place], [place], [place]  [address] Phone:  [phone]; Fax: [phone]

## 2018-03-19 ENCOUNTER — Ambulatory Visit: Payer: Medicare Other | Admitting: Interventional Cardiology

## 2018-03-19 ENCOUNTER — Encounter: Payer: Self-pay | Admitting: Interventional Cardiology

## 2018-03-19 VITALS — BP 106/59 | HR 83 | Ht 66.5 in | Wt 164.8 lb

## 2018-03-19 DIAGNOSIS — Z9889 Other specified postprocedural states: Secondary | ICD-10-CM | POA: Diagnosis not present

## 2018-03-19 DIAGNOSIS — E785 Hyperlipidemia, unspecified: Secondary | ICD-10-CM

## 2018-03-19 DIAGNOSIS — I5042 Chronic combined systolic (congestive) and diastolic (congestive) heart failure: Secondary | ICD-10-CM

## 2018-03-19 DIAGNOSIS — I4819 Other persistent atrial fibrillation: Secondary | ICD-10-CM

## 2018-03-19 DIAGNOSIS — I10 Essential (primary) hypertension: Secondary | ICD-10-CM

## 2018-03-19 NOTE — Patient Instructions (Signed)
Medication Instructions:  Your physician recommends that you continue on your current medications as directed. Please refer to the Current Medication list given to you today.  If you need a refill on your cardiac medications before your next appointment, please call your pharmacy.   Lab work: None Ordered  If you have labs (blood work) drawn today and your tests are completely normal, you will receive your results only by: Marland Kitchen MyChart Message (if you have MyChart) OR . A paper copy in the mail If you have any lab test that is abnormal or we need to change your treatment, we will call you to review the results.  Testing/Procedures: None ordered  Follow-Up: Your physician wants you to follow-up in: 6 months with an APP on Dr. Hoyle Barr team. Bonita Quin will receive a reminder letter in the mail two months in advance. If you don't receive a letter, please call our office to schedule the follow-up appointment.   At [facility], you and your health needs are our priority.  As part of our continuing mission to provide you with exceptional heart care, we have created designated Provider Care Teams.  These Care Teams include your primary Cardiologist (physician) and Advanced Practice Providers (APPs -  Physician Assistants and Nurse Practitioners) who all work together to provide you with the care you need, when you need it. . You will need a follow up appointment in 1 year.  Please call our office 2 months in advance to schedule this appointment.  You may see Everette Rank, MD or one of the following Advanced Practice Providers on your designated Care Team:   . Robbie Lis, PA-C . Dayna Dunn, PA-C . Jacolyn Reedy, PA-C  Any Other Special Instructions Will Be Listed Below (If Applicable).

## 2018-05-04 ENCOUNTER — Other Ambulatory Visit: Payer: Self-pay | Admitting: Interventional Cardiology

## 2018-06-05 ENCOUNTER — Telehealth: Payer: Self-pay | Admitting: Interventional Cardiology

## 2018-06-05 NOTE — Telephone Encounter (Signed)
New Message    Patient has form to get help with purchasing Eliquis and needs help filling the form out, and will bring form into the office.   Patient wants a nurse to call her back about issue.

## 2018-06-05 NOTE — Telephone Encounter (Signed)
I called the pt back but and was advised that she is taking a nap. I left a message asking her to call me back.  According to the pts chart it maybe to early for her to apply for pt assistance through [employer] as she will have to provide her 2019 proof of income and her 2020 out of pocket expense report from her pharmacy showing that she has paid 3% of her annual pay out of her pocket on her medications.

## 2018-06-05 NOTE — Telephone Encounter (Signed)
Follow up:    Patient returning call back. Patient is a little upset. She states she is about out of her medication. Please call patient.

## 2018-06-05 NOTE — Telephone Encounter (Addendum)
**Note De-Identified  Obfuscation** I called the pt back and made her aware that it is to soon to apply for pt asst. as she has not paid 3% of her annual  Income yet for the year 2020 and that she has insurance coverage and is not in the donut hole.  She requested that I give her samples multiple times but I advised her that she needs to pay for her Eliquis which will put her in the donut hole sooner and then she can apply for pt assistance through [employer].  I have advised her many times that Eliquis samples are for "new starts" and that our supply is limited. I recommended that she go on Coumadin which is less expensive until she can apply for pt asst but she is adamant that she will not take Coumadin.  She states that she will call me back if she has any other questions.  I was on this call for more than 30 mins.

## 2018-06-18 ENCOUNTER — Telehealth: Payer: Self-pay

## 2018-06-18 NOTE — Telephone Encounter (Signed)
We received a [employer] Pt asst application with a note attached stating that the pt does not need to wait until April to apply. They are requesting that we complete the provider part of the application, have Dr Eldridge Dace sign it and to fax it back to them.  I have completed the provider part of the application and placed it in Dr Hoyle Barr mail bin awaiting his signature.

## 2018-06-25 NOTE — Telephone Encounter (Signed)
**Note De-Identified  Obfuscation** Dr Eldridge Dace signed the pts [employer] pt asst application and I have faxed it to [employer].

## 2018-07-22 ENCOUNTER — Encounter: Payer: Self-pay | Admitting: Podiatry

## 2018-07-22 ENCOUNTER — Other Ambulatory Visit: Payer: Self-pay

## 2018-07-22 ENCOUNTER — Telehealth: Payer: Self-pay | Admitting: Podiatry

## 2018-07-22 ENCOUNTER — Ambulatory Visit: Payer: Medicare Other | Admitting: Podiatry

## 2018-07-22 VITALS — BP 96/52 | HR 68 | Temp 98.0°F | Resp 16

## 2018-07-22 DIAGNOSIS — D689 Coagulation defect, unspecified: Secondary | ICD-10-CM

## 2018-07-22 DIAGNOSIS — Q828 Other specified congenital malformations of skin: Secondary | ICD-10-CM

## 2018-07-22 DIAGNOSIS — B351 Tinea unguium: Secondary | ICD-10-CM

## 2018-07-22 NOTE — Progress Notes (Signed)
Subjective:  Patient ID: Allison [name], female    DOB: 10-01-1950,  MRN: [id number]  Chief Complaint  Patient presents with  . debride    BL nail trimming   . Nail Problem    Rt3rd toenial BL borders x long time; very sore Tx: trimming    68 y.o. female presents for concern of ingrown nail. On plavix for [place]. Requests trimming of all her nails.  Review of Systems: Negative except as noted in the HPI. Denies N/V/F/Ch.  Past Medical History:  Diagnosis Date  . AC (acromioclavicular) joint bone spurs    spurs in shoulders with pain  . Anxiety   . Aortic insufficiency   . Arthritis    all over  . Chronic combined systolic and diastolic CHF (congestive heart failure) (HCC)   . Constipation   . Coronary artery disease   . Deafness   . Dysrhythmia   . Fatty liver   . Fibromyalgia   . GERD (gastroesophageal reflux disease)   . Hyperlipidemia   . Hypertension   . Hypothyroidism   . IBS (irritable bowel syndrome)   . Mitral regurgitation   . Neuropathy   . Non-ischemic cardiomyopathy (HCC)    a. Normal cors 01/2016.  . Osteoarthritis of glenohumeral joint, left    Left shoulder  . Osteopenia   . Persistent atrial fibrillation   . PFO (patent foramen ovale)    a. closure 03/2016.  . Pre-diabetes   . S/P aortic valve replacement with bioprosthetic valve 04/13/2016   23 mm [facility] Ease bovine pericardial tissue valve  . S/P mitral valve repair 04/13/2016   26 mm Sorin Memo 3D ring annuloplasty  . 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 Tricuspid regurgitation     Current Outpatient Medications:  .  apixaban (ELIQUIS) 5 MG TABS tablet, Take 1 tablet (5 mg total) by mouth 2 (two) times daily., Disp: 180 tablet, Rfl: 3 .  atorvastatin (LIPITOR) 20 MG tablet, Take 20 mg by mouth daily., Disp: , Rfl:  .  Calcium Carbonate-Vitamin D (CALTRATE 600+D) 600-400 MG-UNIT per tablet, Take 1 tablet by mouth daily. , Disp: , Rfl:  .   carvedilol (COREG) 12.5 MG tablet, TAKE 1 TABLET BY MOUTH TWO  TIMES DAILY WITH MEALS, Disp: 180 tablet, Rfl: 3 .  cetirizine (ZYRTEC) 10 MG tablet, Take 10 mg by mouth daily., Disp: , Rfl:  .  diphenhydrAMINE (BENADRYL) 25 mg capsule, Take 25 mg by mouth daily as needed for itching. , Disp: , Rfl:  .  fluticasone (FLONASE) 50 MCG/ACT nasal spray, Place 2 sprays into both nostrils daily., Disp: , Rfl:  .  furosemide (LASIX) 40 MG tablet, Take 1 tablet by mouth daily except on Tuesdays and Thursdays take 1 tablet by mouth TWICE A DAY, Disp: 46 tablet, Rfl: 0 .  gabapentin (NEURONTIN) 600 MG tablet, Take 600 mg by mouth 3 (three) times daily. , Disp: , Rfl:  .  levothyroxine (SYNTHROID, LEVOTHROID) 137 MCG tablet, Take 137 mcg by mouth daily before breakfast. , Disp: , Rfl:  .  LORazepam (ATIVAN) 2 MG tablet, Take 1 tablet (2 mg total) by mouth at bedtime. For restful sleep (Patient taking differently: Take 1-2 mg by mouth See admin instructions. Take 1 mg by mouth in the morning as needed for anxiety and take 2 mg by mouth at bedtime), Disp: 10 tablet, Rfl: 0 .  montelukast (SINGULAIR) 10 MG tablet, Take 10 mg by  mouth daily before breakfast. , Disp: , Rfl:  .  omeprazole (PRILOSEC) 40 MG capsule, Take 40 mg by mouth daily. , Disp: , Rfl:  .  potassium chloride SA (K-DUR,KLOR-CON) 20 MEQ tablet, TAKE 1 TABLET BY MOUTH TWO  TIMES DAILY, Disp: 180 tablet, Rfl: 3 .  tiZANidine (ZANAFLEX) 2 MG tablet, Take 2 tablets (4 mg total) by mouth every 6 (six) hours as needed for muscle spasms., Disp: 40 tablet, Rfl: 0  Social History   Tobacco Use  Smoking Status Never Smoker  Smokeless Tobacco Never Used    Allergies  Allergen Reactions  . Clarithromycin Nausea Only and Other (See Comments)    Dizzy, blurred vision, achy stomach Dizzy, blurred vision, stomach ache. unknown   . Trazodone Other (See Comments)    Weakness in legs Numbness in arms Funny feeling Rapid heart beat Loses focus. unknown    . Trazodone And Nefazodone Other (See Comments)    Weakness in legs Numbness in arms Funny feeling Rapid heart beat Lose focus  . Naproxen Sodium Other (See Comments)    unknown  . Sulfa Antibiotics Other (See Comments)    Blister on large toe  . Sulfamethoxazole Other (See Comments)    unknown  . Aleve [Naproxen] Nausea And Vomiting and Other (See Comments)    Stomach ache  . Doxycycline Nausea Only and Other (See Comments)    Stomach ache Stomach ache. unknown   . Durabac [Apap-Salicyl-Phenyltolox-Caff] Nausea And Vomiting  . Estradiol Nausea Only and Other (See Comments)    Hurt stomach unknown  . Ibuprofen Nausea Only and Other (See Comments)    Stomach ahce unknown  . Macrobid [Nitrofurantoin] Other (See Comments)    BLOATING  . Methocarbamol Nausea Only and Other (See Comments)    Hurt stomach unknown  . Oxaprozin Nausea Only and Other (See Comments)    Hurt stomach unknown  . Topiramate Nausea And Vomiting and Other (See Comments)    unknown  . Valdecoxib Nausea And Vomiting and Other (See Comments)    Stomach ache  unknown   Objective:   Vitals:   07/22/18 1348  BP: (!) 96/52  Pulse: 68  Resp: 16  Temp: 98 F (36.7 C)   There is no height or weight on file to calculate BMI. Constitutional Well developed. Well nourished.  Vascular Dorsalis pedis pulses present 1+ bilaterally  Posterior tibial pulses present 1+ bilaterally  Pedal hair growth diminished. Capillary refill normal to all digits.  No cyanosis or clubbing noted.  Neurologic Normal speech. Oriented to person, place, and time. Epicritic sensation to light touch grossly present bilaterally.  Dermatologic Nails elongated, thickened, dystrophic. No open wounds. HPK 5th MPJ Right  Orthopedic: Normal joint ROM without pain or crepitus bilaterally. No visible deformities. No bony tenderness.   Assessment:   1. Onychomycosis   2. Porokeratosis   3. Coagulation defect [facility])     Plan:  Patient was evaluated and treated and all questions answered.  Diabetes with Coagulation Defect, Onychomycosis -Nails x10 debrided sharply and manually with large nail nipper and rotary burr.   Procedure: Nail Debridement Rationale: Patient meets criteria for routine foot care due to coag defect Type of Debridement: manual, sharp debridement. Instrumentation: Nail nipper, rotary burr. Number of Nails: 10   Porokeratosis R 5th MPJ   Procedure: Paring of Lesion Rationale: painful hyperkeratotic lesion Type of Debridement: manual, sharp debridement. Instrumentation: 312 blade Number of Lesions: 1   Return if symptoms worsen or fail to improve.

## 2018-07-22 NOTE — Telephone Encounter (Signed)
Per Fleet Contras front office rep list was received and given to other CMA in office

## 2018-07-22 NOTE — Telephone Encounter (Signed)
Pt called to ask if her Medication list was received. Stated that she sent it today to [place] office but was unsure if it went through. Please call and confirm

## 2018-08-02 ENCOUNTER — Other Ambulatory Visit: Payer: Self-pay | Admitting: Interventional Cardiology

## 2018-08-02 MED ORDER — APIXABAN 5 MG PO TABS: 5.0000 mg | ORAL_TABLET | Freq: Two times a day (BID) | ORAL | 6 refills | Status: AC

## 2018-08-02 NOTE — Telephone Encounter (Signed)
New Message    *STAT* If patient is at the pharmacy, call can be transferred to refill team.   1. Which medications need to be refilled? (please list name of each medication and dose if known)  Eliquis  2. Which pharmacy/location (including street and city if local pharmacy) is medication to be sent to? [facility] [place]   3. Do they need a 30 day or 90 day supply? 30

## 2018-08-02 NOTE — Telephone Encounter (Signed)
Pt last saw Dr Eldridge Dace 03/19/18, last labs 12/20/17 Creat 1.03, age 68, weight 74.8, based on specified criteria pt is on appropriate dosage of Eliquis 5mg  BID.  Will refill rx.

## 2018-08-05 NOTE — Telephone Encounter (Signed)
Received notification from [employer] that pt is not eligible to receive Eliquis for the following reason : Product Covered by insurance

## 2018-08-06 NOTE — Telephone Encounter (Signed)
We received a fax today stating that [employer] has approved the pt for assistance with her Eliquis. Approval is good from 08/05/2018 until 05/01/2019.  The letter states that they have notified the pt of this denial.

## 2018-09-07 IMAGING — MR MR CARD MORPHOLOGY WO/W CM
13 of 14 series · 38 of 40 positions shown · IV contrast (multihance)
Comparison: none

CLINICAL DATA: Cardiomyopathy of uncertain etiology.

EXAM:
CARDIAC MRI
TECHNIQUE: The patient was scanned on a 1.5 Tesla GE magnet. A dedicated
cardiac coil was used. Functional imaging was done using Fiesta
sequences. [DATE], and 4 chamber views were done to assess for RWMA's.
Modified Suave rule using a short axis stack was used to
calculate an ejection fraction on a dedicated work station using
Circle software. The patient received 30 cc of Multihance. After 10
minutes inversion recovery sequences were used to assess for
infiltration and scar tissue.
CONTRAST:  30 cc Multihance

[Series 3: bSSFP · sagittal · 8.0mm · 1.41mm/px · 1 of 18 slices shown (1 of 4)]
[im 1/18]
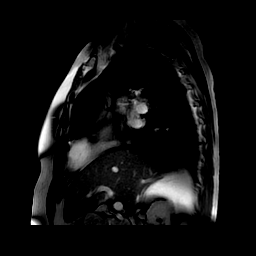

[Series 4: bSSFP · axial · 8.0mm · 1.37mm/px · z∈[-105,-58]mm · 8 of 160 slices shown (2 of 4)]
[im 1/160]
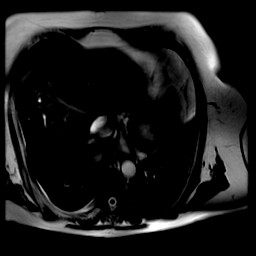
[im 23/160]
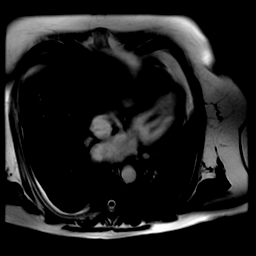
[im 46/160]
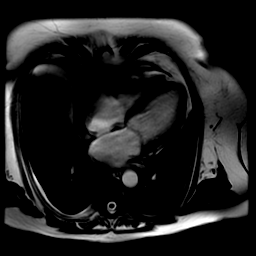
[im 69/160]
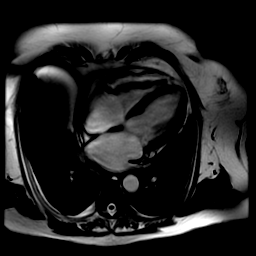
[im 91/160]
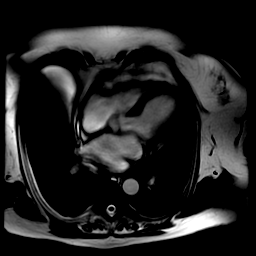
[im 114/160]
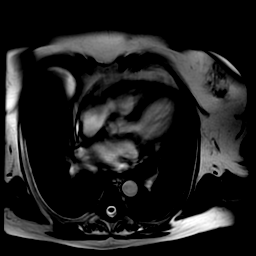
[im 137/160]
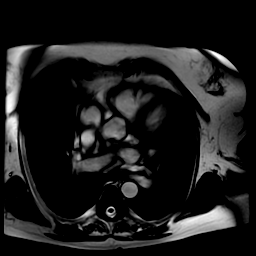
[im 160/160]
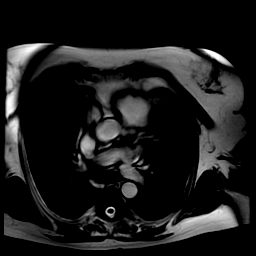

[Series 6: bSSFP · oblique · 8.0mm · 1.41mm/px · 17 of 320 slices shown (3 of 4)]
[im 1/320]
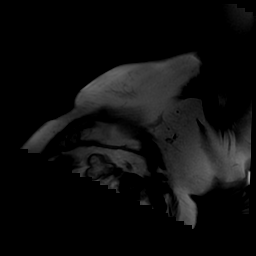
[im 20/320]
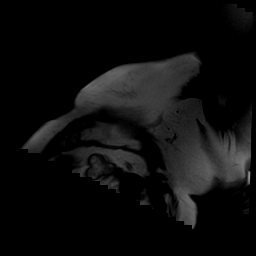
[im 40/320]
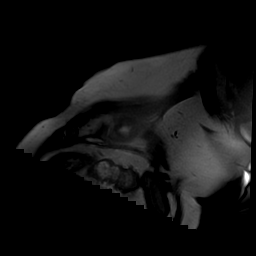
[im 60/320]
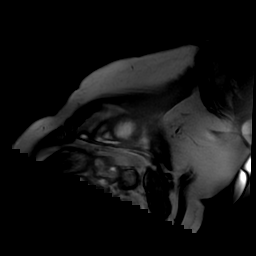
[im 80/320]
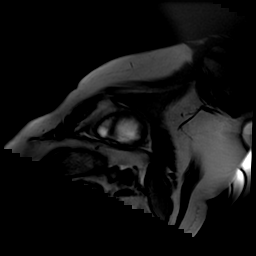
[im 100/320]
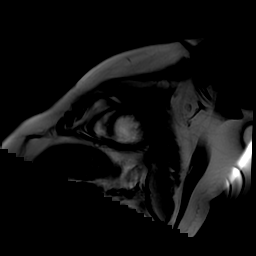
[im 120/320]
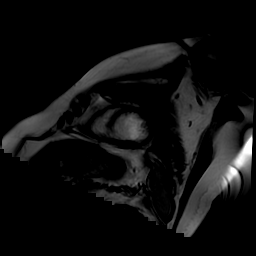
[im 140/320]
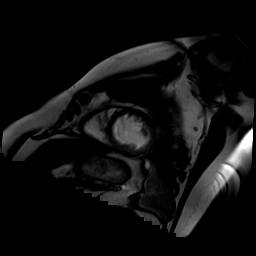
[im 160/320]
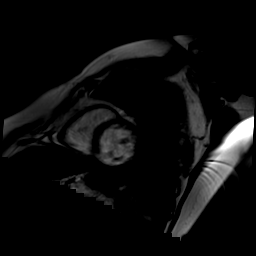
[im 180/320]
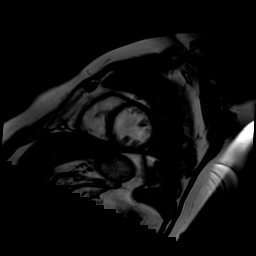
[im 200/320]
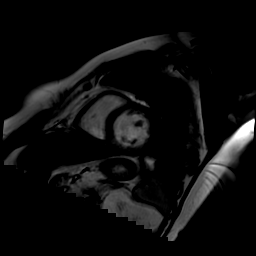
[im 220/320]
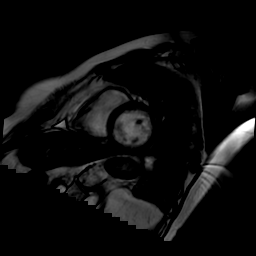
[im 240/320]
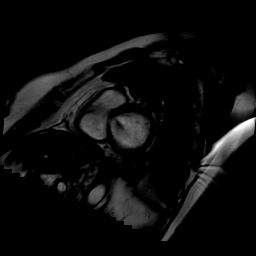
[im 260/320]
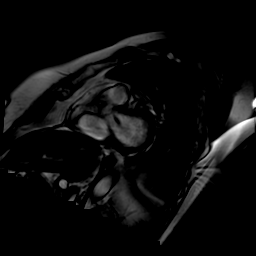
[im 280/320]
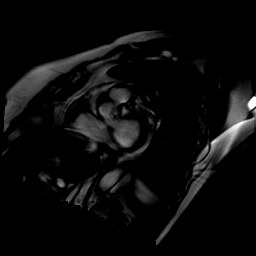
[im 300/320]
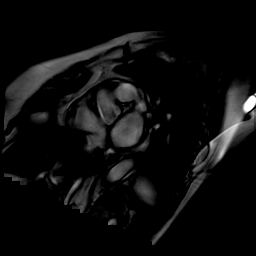
[im 320/320]
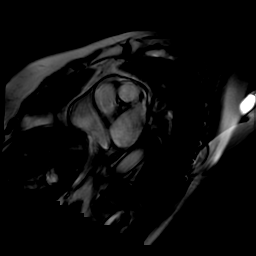

[Series 8: bSSFP · axial · 8.0mm · 1.21mm/px · z∈[+32,+217]mm · 3 of 60 slices shown (4 of 4)]
[im 1/60]
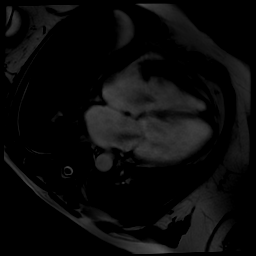
[im 30/60]
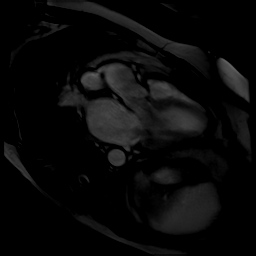
[im 60/60]
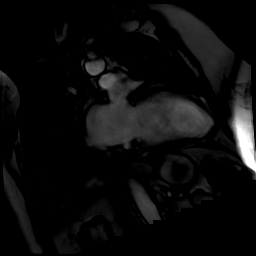

[Series 12: delayed ir prep · oblique · 8.0mm · 1.41mm/px · 1 of 13 slices shown]
[im 1/13]
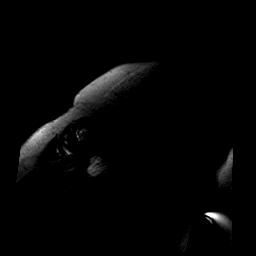

[Series 13: rad mde · axial · 8.0mm · 1.21mm/px · 1 of 3 slices shown]
[im 1/3]
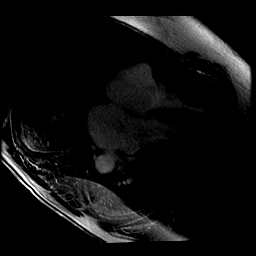

[Series 950: sa base · oblique · 8.0mm · 2.81mm/px · 1 of 23 slices shown]
[im 1/23]
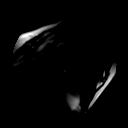

[Series 951: sa mid · oblique · 8.0mm · 2.81mm/px · 1 of 23 slices shown]
[im 1/23]
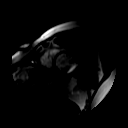

[Series 952: sa apex · oblique · 8.0mm · 2.81mm/px · 1 of 23 slices shown]
[im 1/23]
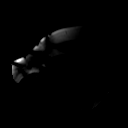

[Series 953: option 2 · axial · 8.0mm · 2.81mm/px · 1 of 23 slices shown]
[im 1/23]
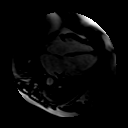

[Series 954: 2 ch · coronal · 8.0mm · 2.81mm/px · 1 of 23 slices shown]
[im 1/23]
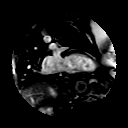

[Series 955: 4 ch · axial · 8.0mm · 2.81mm/px · 1 of 23 slices shown]
[im 1/23]
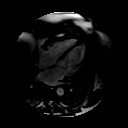

[Series 956: ao outflow loc · axial · 8.0mm · 2.81mm/px · 1 of 23 slices shown]
[im 1/23]
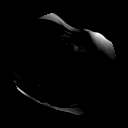

[38 of 40 positions shown; findings below may reference images not displayed]

FINDINGS: Technically difficult study. Patient was in atrial fibrillation. She
is also deaf and unable to follow breath-hold instructions, so study
was done free-breathing.

Limited images of the lung fields showed no gross abnormalities.

Normal left ventricular size with mild diffuse hypokinesis, EF 45%.
Normal wall thickness. Normal right ventricular size and systolic
function. Moderate left atrial enlargement, mild right atrial
enlargement. Trileaflet aortic valve with at least moderate aortic
regurgitation. Mitral valve was normal in thickness and motion,
there was severe central mitral regurgitation. There was moderate
tricuspid regurgitation.

On delayed enhancement imaging, there was no myocardial late
gadolinium enhancement (LGE).

MEASUREMENTS:
MEASUREMENTS
LV EDV 175 mL

LV SV 79 mL

LV EF 45%
IMPRESSION: 1. Technically difficult study due to atrial fibrillation and
free-breathing sequences.

2.  Normal LV size with mild diffuse hypokinesis, EF 45%.

3.  Trileaflet aortic valve, at least moderate aortic insufficiency.

4.  Severe central mitral regurgitation.

5.  Moderate tricuspid regurgitation.

6.  Normal RV size and systolic function.

7. No myocardial LGE, so no definitive evidence for prior MI,
infiltrative disease, or myocarditis.

Kayley Otieno

## 2018-09-16 ENCOUNTER — Telehealth: Payer: Self-pay

## 2018-09-16 ENCOUNTER — Other Ambulatory Visit: Payer: Self-pay | Admitting: Interventional Cardiology

## 2018-09-16 NOTE — Telephone Encounter (Signed)
Virtual Visit Pre-Appointment Phone Call  TELEPHONE CALL NOTE  Sulaf Finley has been deemed a candidate for a follow-up tele-health visit to limit community exposure during the Covid-19 pandemic. I spoke with the patient via phone to ensure availability of phone/video source, confirm preferred email & phone number, and discuss instructions and expectations.  I reminded Atzi Ketchen to be prepared with any vital sign and/or heart rhythm information that could potentially be obtained via home monitoring, at the time of her visit. I reminded Kataya Amundson to expect a phone call prior to her visit.  Patient agrees to consent via sign language interpreter.  Lattie Haw, RN 09/16/2018 3:07 PM   FULL LENGTH CONSENT FOR TELE-HEALTH VISIT   I hereby voluntarily request, consent and authorize [facility] and its employed or contracted physicians, physician assistants, nurse practitioners or other licensed health care professionals (the Practitioner), to provide me with telemedicine health care services (the "Services") as deemed necessary by the treating Practitioner. I acknowledge and consent to receive the Services by the Practitioner via telemedicine. I understand that the telemedicine visit will involve communicating with the Practitioner through live audiovisual communication technology and the disclosure of certain medical information by electronic transmission. I acknowledge that I have been given the opportunity to request an in-person assessment or other available alternative prior to the telemedicine visit and am voluntarily participating in the telemedicine visit.  I understand that I have the right to withhold or withdraw my consent to the use of telemedicine in the course of my care at any time, without affecting my right to future care or treatment, and that the Practitioner or I may terminate the telemedicine visit at any time. I understand that I  have the right to inspect all information obtained and/or recorded in the course of the telemedicine visit and may receive copies of available information for a reasonable fee.  I understand that some of the potential risks of receiving the Services via telemedicine include:  Marland Kitchen Delay or interruption in medical evaluation due to technological equipment failure or disruption; . Information transmitted may not be sufficient (e.g. poor resolution of images) to allow for appropriate medical decision making by the Practitioner; and/or  . In rare instances, security protocols could fail, causing a breach of personal health information.  Furthermore, I acknowledge that it is my responsibility to provide information about my medical history, conditions and care that is complete and accurate to the best of my ability. I acknowledge that Practitioner's advice, recommendations, and/or decision may be based on factors not within their control, such as incomplete or inaccurate data provided by me or distortions of diagnostic images or specimens that may result from electronic transmissions. I understand that the practice of medicine is not an exact science and that Practitioner makes no warranties or guarantees regarding treatment outcomes. I acknowledge that I will receive a copy of this consent concurrently upon execution via email to the email address I last provided but may also request a printed copy by calling the office of [facility].    I understand that my insurance will be billed for this visit.   I have read or had this consent read to me. . I understand the contents of this consent, which adequately explains the benefits and risks of the Services being provided via telemedicine.  . I have been provided ample opportunity to ask questions regarding this consent and the Services and have had my questions answered to my  satisfaction. . I give my informed consent for the services to be provided through the  use of telemedicine in my medical care  By participating in this telemedicine visit I agree to the above.

## 2018-09-18 ENCOUNTER — Other Ambulatory Visit: Payer: Self-pay | Admitting: Interventional Cardiology

## 2018-09-18 MED ORDER — FUROSEMIDE 40 MG PO TABS: ORAL_TABLET | ORAL | 1 refills | Status: DC

## 2018-09-18 NOTE — Progress Notes (Signed)
Virtual Visit via Video Note   This visit type was conducted due to national recommendations for restrictions regarding the COVID-19 Pandemic (e.g. social distancing) in an effort to limit this patient's exposure and mitigate transmission in our community.  Due to her co-morbid illnesses, this patient is at least at moderate risk for complications without adequate follow up.  This format is felt to be most appropriate for this patient at this time.  All issues noted in this document were discussed and addressed.  A limited physical exam was performed with this format.  Please refer to the patient's chart for her consent to telehealth for [facility].   Since she is hearing impaired, there was an intermediary. Date:  09/19/2018   ID:  Rigoberto Noel, DOB May 13, 1950, MRN [id number]  Patient Location: Home Provider Location: Home  PCP:  [name], MD  Cardiologist:  Lance Muss, MD  Electrophysiologist:  None   Evaluation Performed:  Follow-Up Visit  Chief Complaint:  AFib  History of Present Illness:    Allison [name] is a 68 y.o. female with a past medical history significant for deafness, persistent atrial fibrillation, hypertension, fibromyalgia, asthma, GERD,chronic combined systolic and diastolic CHF,s/paortic valve replacement using bioprosthetic tissue valve,mitral valve repair, tricuspid valve repair, closure of patent foramen ovale maze procedure 04/13/2016.  She has a history of poorly tolerated atrial fibrillationand failedmultiple medications in 2016 which she was unable to tolerate,including flecainideand Propafenone.She eventually underwent valve replacement and Maze procedure 04/19/2016. She continues on Eliquis for stroke risk reduction. She has been maintaining sinus rhythm.Since that time she had a knee replacement and went to rehab and then fell and broke her hip.  She was recently seen in the office at the [facility] by  Rudi Coco, NPon 12/20/17.At that time she had some complaints of shortness of breath. Her weight was actually down 6 pounds. Labs on that day showed mildly increased serum creatinine, normal electrolytes.Her hemoglobin was down to 10.8 from 12.6 in April. Chest x-ray showed no active cardiopulmonary disease.  Since the last visit, she had persistent pain.  She has had a few falls as well, but she felt that they were not as bad. She has an L4 fracture and needs a shot in her back.  THe pain goes down her leg.  She may need another knee replacement.    In 11/19, it was noted that "She has lost nearly 30 lbs in the past few months.  SHe has improved her diet.  She eats less and avoids fast fods.  She does not crave it anymore. ."  She was in NSR and appeared euvolemic at that time.   She is doing well since the last visit.    Denies : Chest pain. Dizziness. Leg edema. Nitroglycerin use. Orthopnea. Palpitations. Paroxysmal nocturnal dyspnea. Shortness of breath. Syncope.  She walks with a cane, will need hip surgery.  She has  Aright knee problem.   The patient does not have symptoms concerning for COVID-19 infection (fever, chills, cough, or new shortness of breath).    Past Medical History:  Diagnosis Date  . AC (acromioclavicular) joint bone spurs    spurs in shoulders with pain  . Anxiety   . Aortic insufficiency   . Arthritis    all over  . Chronic combined systolic and diastolic CHF (congestive heart failure) (HCC)   . Constipation   . Coronary artery disease   . Deafness   . Dysrhythmia   . Fatty  liver   . Fibromyalgia   . GERD (gastroesophageal reflux disease)   . Hyperlipidemia   . Hypertension   . Hypothyroidism   . IBS (irritable bowel syndrome)   . Mitral regurgitation   . Neuropathy   . Non-ischemic cardiomyopathy (HCC)    a. Normal cors 01/2016.  . Osteoarthritis of glenohumeral joint, left    Left shoulder  . Osteopenia   . Persistent atrial fibrillation    . PFO (patent foramen ovale)    a. closure 03/2016.  . Pre-diabetes   . S/P aortic valve replacement with bioprosthetic valve 04/13/2016   23 [facility]ase bovine pericardial tissue valve  . S/P mitral valve repair 04/13/2016   26 mm Sorin Memo 3D ring annuloplasty  . 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 Tricuspid regurgitation    Past Surgical History:  Procedure Laterality Date  . ABDOMINAL HYSTERECTOMY     partial  . AORTIC VALVE REPLACEMENT N/A 04/13/2016   Procedure: AORTIC VALVE REPLACEMENT (AVR) implanted with Magna Ease size 23mm;  Surgeon: Purcell Nailslarence H Owen, MD;  Location: [facility] OR;  Service: Open Heart Surgery;  Laterality: N/A;  . BREAST CYST EXCISION N/A 08/03/2017   pt has not had had breast surgery, can not remove this  . CARDIAC CATHETERIZATION N/A 02/18/2016   Procedure: Right/Left Heart Cath and Coronary Angiography;  Surgeon: Laurey Moralealton S McLean, MD;  [facility];  Service: Cardiovascular;  Laterality: N/A;  . CARDIOVERSION N/A 10/16/2014   Procedure: CARDIOVERSION;  Surgeon: [name], MD;  [facility];  Service: Cardiovascular;  Laterality: N/A;  . CARDIOVERSION N/A 12/04/2014   Procedure: CARDIOVERSION;  Surgeon: [name], MD;  Location: [facility] ENDOSCOPY;  Service: Cardiovascular;  Laterality: N/A;  . COLON SURGERY     spurs  . COLONOSCOPY  [id number]  . COLONOSCOPY WITH PROPOFOL  04/09/2012   Procedure: COLONOSCOPY WITH PROPOFOL;  Surgeon: Charolett BumpersMartin K Johnson, MD;  Location: [facility] ENDOSCOPY;  Service: Endoscopy;  Laterality: N/A;  . ESOPHAGOGASTRODUODENOSCOPY ENDOSCOPY     several times  . EXCISION OF SKIN TAG N/A 08/03/2017   Procedure: EXCISION OF BILATERAL CHEST WALL SKINS TAGS;  Surgeon: Berna Bueonnor, Chelsea A, MD;  Location: [facility];  Service: General;  Laterality: N/A;  . EYE SURGERY     catarcts  . MAZE N/A 04/13/2016   Procedure: MAZE;  Surgeon: Purcell Nailslarence H Owen, MD;   [facility];  Service: Open Heart Surgery;  Laterality: N/A;  . MITRAL VALVE REPAIR N/A 04/13/2016   Procedure: MITRAL VALVE REPAIR (MVR) using 26mm Sorin /MEMO 3D annuloplasty ring;  Surgeon: Purcell Nailslarence H Owen, MD;  Location: [facility];  Service: Open Heart Surgery;  Laterality: N/A;  . PATENT FORAMEN OVALE CLOSURE N/A 04/13/2016   Procedure: PATENT FORAMEN OVALE (PFO) CLOSURE;  Surgeon: Purcell Nailslarence H Owen, MD;  Location: [facility];  Service: Open Heart Surgery;  Laterality: N/A;  . TEE WITHOUT CARDIOVERSION N/A 02/18/2016   Procedure: TRANSESOPHAGEAL ECHOCARDIOGRAM (TEE);  Surgeon: Laurey Moralealton S McLean, MD;  [facility];  Service: Cardiovascular;  Laterality: N/A;  . TEE WITHOUT CARDIOVERSION N/A 04/13/2016   Procedure: TRANSESOPHAGEAL ECHOCARDIOGRAM (TEE);  Surgeon: Purcell Nailslarence H Owen, MD;  [facility];  Service: Open Heart Surgery;  Laterality: N/A;  . THYROID SURGERY  1991  . TONSILLECTOMY    . TOTAL KNEE ARTHROPLASTY Right 08/21/2017   Procedure: RIGHT TOTAL KNEE ARTHROPLASTY;  Surgeon: Marcene Corningalldorf, Peter, MD;  Location: [facility] OR;  Service: Orthopedics;  Laterality: Right;  . TOTAL SHOULDER ARTHROPLASTY Right 07/02/2012   Procedure: TOTAL SHOULDER ARTHROPLASTY;  Surgeon: Mable Paris, MD;  [facility];  Service: Orthopedics;  Laterality: Right;  Right total shoulder arthroplasty  . TOTAL SHOULDER ARTHROPLASTY Left 11/16/2016  . TOTAL SHOULDER ARTHROPLASTY Left 11/16/2016   Procedure: TOTAL SHOULDER ARTHROPLASTY;  Surgeon: Jones Broom, MD;  Location: [facility] OR;  Service: Orthopedics;  Laterality: Left;  Left total shoulder arthroplasty  . TRICUSPID VALVE REPLACEMENT N/A 04/13/2016   Procedure: TRICUSPID VALVE REPAIR using a T26 MC3 Edwards annuloplasty ring;  Surgeon: Purcell Nails, MD;  Location: [facility] OR;  Service: Open Heart Surgery;  Laterality: N/A;  . TUBAL LIGATION    . vericose surgery Right leg  05-1998  . WISDOM TOOTH EXTRACTION       Current Meds  Medication Sig  .  apixaban (ELIQUIS) 5 MG TABS tablet Take 1 tablet (5 mg total) by mouth 2 (two) times daily.  Marland Kitchen atorvastatin (LIPITOR) 20 MG tablet Take 20 mg by mouth daily.  . Calcium Carbonate-Vitamin D (CALTRATE 600+D) 600-400 MG-UNIT per tablet Take 1 tablet by mouth daily.   . carvedilol (COREG) 12.5 MG tablet TAKE 1 TABLET BY MOUTH TWO  TIMES DAILY WITH MEALS  . cetirizine (ZYRTEC) 10 MG tablet Take 10 mg by mouth daily.  . diphenhydrAMINE (BENADRYL) 25 mg capsule Take 25 mg by mouth daily as needed for itching.   . fluticasone (FLONASE) 50 MCG/ACT nasal spray Place 2 sprays into both nostrils daily.  . furosemide (LASIX) 40 MG tablet Take 1 tablet by mouth daily except on Tuesdays and Thursdays take 1 tablet by mouth TWICE A DAY  . gabapentin (NEURONTIN) 600 MG tablet Take 600 mg by mouth 3 (three) times daily.   Marland Kitchen ipratropium (ATROVENT) 0.06 % nasal spray INSTILL 2 SPRAYS BY NASAL ROUTE 4 TIMES DAILY.  Marland Kitchen levothyroxine (SYNTHROID, LEVOTHROID) 137 MCG tablet Take 137 mcg by mouth daily before breakfast.   . LORazepam (ATIVAN) 2 MG tablet Take 1 tablet (2 mg total) by mouth at bedtime. For restful sleep (Patient taking differently: Take 1-2 mg by mouth See admin instructions. Take 1 mg by mouth in the morning as needed for anxiety and take 2 mg by mouth at bedtime)  . montelukast (SINGULAIR) 10 MG tablet Take 10 mg by mouth daily before breakfast.   . omeprazole (PRILOSEC) 40 MG capsule Take 40 mg by mouth daily.   . potassium chloride SA (K-DUR,KLOR-CON) 20 MEQ tablet TAKE 1 TABLET BY MOUTH TWO  TIMES DAILY  . tiZANidine (ZANAFLEX) 2 MG tablet Take 2 tablets (4 mg total) by mouth every 6 (six) hours as needed for muscle spasms.     Allergies:   Clarithromycin; Trazodone; Trazodone and nefazodone; Naproxen sodium; Sulfa antibiotics; Sulfamethoxazole; Aleve [naproxen]; Doxycycline; Durabac [apap-salicyl-phenyltolox-caff]; Estradiol; Ibuprofen; Macrobid [nitrofurantoin]; Methocarbamol; Oxaprozin; Topiramate;  and Valdecoxib   Social History   Tobacco Use  . Smoking status: Never Smoker  . Smokeless tobacco: Never Used  Substance Use Topics  . Alcohol use: No  . Drug use: No     Family Hx: The patient's family history includes Arrhythmia in her father and mother; Heart attack in her maternal uncle; Hypertension in her father, maternal grandmother, and mother; Kidney disease in her paternal grandfather; Lung cancer in her father; Stroke in her maternal grandmother.  ROS:   Please see the history of present illness.     All other systems reviewed and are negative.  Prior CV studies:   The following studies were reviewed today:  Labs reviewed Labs/Other Tests and Data Reviewed:    EKG:  An ECG dated 8/19 was personally reviewed today and demonstrated:  NSR, PAC  Recent Labs: 12/04/2017: ALT 5; TSH 2.120 12/20/2017: BUN 10; Creatinine, Ser 1.03; Hemoglobin 10.8; Platelets 308; Potassium 3.8; Sodium 138   Recent Lipid Panel Lab Results  Component Value Date/Time   CHOL 148 12/04/2017 10:55 AM   TRIG 123 12/04/2017 10:55 AM   HDL 52 12/04/2017 10:55 AM   CHOLHDL 2.8 12/04/2017 10:55 AM   LDLCALC 71 12/04/2017 10:55 AM    Wt Readings from Last 3 Encounters:  03/19/18 164 lb 12.8 oz (74.8 kg)  01/02/18 173 lb (78.5 kg)  12/20/17 177 lb 9.6 oz (80.6 kg)     Objective:    Vital Signs:  BP (!) 124/57   Pulse 64   Ht 5' 6.5" (1.689 m)   BMI 26.20 kg/m    VITAL SIGNS:  reviewed GEN:  no acute distress RESPIRATORY:  no shortness of breath PSYCH:  normal affect exam limited  ASSESSMENT & PLAN:    1. Atrial fibrillation: No sx of AFib. Rate controlled. Anticoagulated for stroke prevention. 2. HTN: The current medical regimen is effective;  continue present plan and medications. 3. Chronic systolic heart failure: No issues with volume overload. 4. S/p AVR, MVR, TV repair: SBE prophylaxis needed 5. No further cardiac testing needed before surgery coming up in June 2020 on  hip.   COVID-19 Education: The signs and symptoms of COVID-19 were discussed with the patient and how to seek care for testing (follow up with PCP or arrange E-visit).  The importance of social distancing was discussed today.  Time:   Today, I have spent 15 minutes with the patient with telehealth technology discussing the above problems.     Medication Adjustments/Labs and Tests Ordered: Current medicines are reviewed at length with the patient today.  Concerns regarding medicines are outlined above.   Tests Ordered: No orders of the defined types were placed in this encounter.   Medication Changes: No orders of the defined types were placed in this encounter.   Disposition:  Follow up in 6 month(s)  Signed, Lance Muss, MD  09/19/2018 9:53 AM    [facility]

## 2018-09-19 ENCOUNTER — Other Ambulatory Visit: Payer: Self-pay

## 2018-09-19 ENCOUNTER — Telehealth (INDEPENDENT_AMBULATORY_CARE_PROVIDER_SITE_OTHER): Payer: Medicare Other | Admitting: Interventional Cardiology

## 2018-09-19 ENCOUNTER — Telehealth: Payer: Self-pay | Admitting: Interventional Cardiology

## 2018-09-19 ENCOUNTER — Encounter: Payer: Self-pay | Admitting: Interventional Cardiology

## 2018-09-19 VITALS — BP 124/57 | HR 64 | Ht 66.5 in

## 2018-09-19 DIAGNOSIS — I5042 Chronic combined systolic (congestive) and diastolic (congestive) heart failure: Secondary | ICD-10-CM

## 2018-09-19 DIAGNOSIS — Z9889 Other specified postprocedural states: Secondary | ICD-10-CM

## 2018-09-19 DIAGNOSIS — I10 Essential (primary) hypertension: Secondary | ICD-10-CM

## 2018-09-19 DIAGNOSIS — Z7901 Long term (current) use of anticoagulants: Secondary | ICD-10-CM | POA: Diagnosis not present

## 2018-09-19 DIAGNOSIS — I4819 Other persistent atrial fibrillation: Secondary | ICD-10-CM

## 2018-09-19 NOTE — Telephone Encounter (Signed)
New Message    Darel Hong is the Interpreter assigned to help with the Virtual call today and she hasnt heard anything yet    Please call

## 2018-09-19 NOTE — Telephone Encounter (Signed)
Called and left detailed message on patient's VM with the sign language interpreter letting her know that there were no changes at her visit today. F/U 6 months with Dr. Eldridge Dace. Instructed for the patient to call back if she has any questions.

## 2018-09-19 NOTE — Patient Instructions (Signed)

## 2018-10-10 ENCOUNTER — Other Ambulatory Visit: Payer: Self-pay | Admitting: Internal Medicine

## 2018-10-10 MED ORDER — HYDROCODONE-ACETAMINOPHEN 5-325 MG PO TABS: 1.0000 | ORAL_TABLET | Freq: Four times a day (QID) | ORAL | 0 refills | Status: DC | PRN

## 2018-10-11 ENCOUNTER — Encounter: Payer: Self-pay | Admitting: Internal Medicine

## 2018-10-11 ENCOUNTER — Non-Acute Institutional Stay (SKILLED_NURSING_FACILITY): Payer: Medicare Other | Admitting: Internal Medicine

## 2018-10-11 ENCOUNTER — Other Ambulatory Visit: Payer: Self-pay | Admitting: Internal Medicine

## 2018-10-11 DIAGNOSIS — I5042 Chronic combined systolic (congestive) and diastolic (congestive) heart failure: Secondary | ICD-10-CM

## 2018-10-11 DIAGNOSIS — E034 Atrophy of thyroid (acquired): Secondary | ICD-10-CM

## 2018-10-11 DIAGNOSIS — M978XXD Periprosthetic fracture around other internal prosthetic joint, subsequent encounter: Secondary | ICD-10-CM | POA: Diagnosis not present

## 2018-10-11 DIAGNOSIS — I4819 Other persistent atrial fibrillation: Secondary | ICD-10-CM | POA: Diagnosis not present

## 2018-10-11 DIAGNOSIS — E785 Hyperlipidemia, unspecified: Secondary | ICD-10-CM

## 2018-10-11 DIAGNOSIS — Z96649 Presence of unspecified artificial hip joint: Secondary | ICD-10-CM

## 2018-10-11 DIAGNOSIS — M25572 Pain in left ankle and joints of left foot: Secondary | ICD-10-CM

## 2018-10-11 DIAGNOSIS — Z8679 Personal history of other diseases of the circulatory system: Secondary | ICD-10-CM

## 2018-10-11 DIAGNOSIS — I10 Essential (primary) hypertension: Secondary | ICD-10-CM

## 2018-10-11 DIAGNOSIS — Z9889 Other specified postprocedural states: Secondary | ICD-10-CM

## 2018-10-11 DIAGNOSIS — Z953 Presence of xenogenic heart valve: Secondary | ICD-10-CM

## 2018-10-11 MED ORDER — TRAMADOL HCL 50 MG PO TABS: 100.0000 mg | ORAL_TABLET | Freq: Four times a day (QID) | ORAL | 0 refills | Status: DC | PRN

## 2018-10-11 NOTE — Progress Notes (Addendum)
: Provider:  Randon Goldsmith. Kady Toothaker MD. Location:  [facility] Room Number: 515-P Place of Service:  SNF (31)  PCP: [name], MD Patient Care Team: [name], MD as PCP - General Corky Crafts, MD as PCP - Cardiology (Cardiology)  Extended Emergency Contact Information Primary Emergency Contact: Phillips,Nicole Address: [address], [place] Home Phone: [phone] Relation: Daughter Secondary Emergency Contact: Freda Munro          [place], [place] Home Phone: [phone] Relation: Daughter     Allergies: Clarithromycin, Trazodone, Trazodone and nefazodone, Naproxen sodium, Sulfa antibiotics, Sulfamethoxazole, Aleve [naproxen], Doxycycline, Durabac [apap-salicyl-phenyltolox-caff], Estradiol, Ibuprofen, Macrobid [nitrofurantoin], Methocarbamol, Oxaprozin, Topiramate, and Valdecoxib  Chief Complaint  Patient presents with   New Admit To SNF    New Admission Visit    HPI: Patient is 68 y.o. female who is deaf but reads lips presented to [facility] ED with complaints of right knee pain and bilateral ankle pain after mechanical fall earlier in the day.  She lost her balance when she got up to open her blinds.  She has a history of right knee replacement 1 year ago and she said her knee bent underneath her when she fell and that she twisted both ankles she has history of neuropathy with numbness and tingling in bilateral lower extremities that is unchanged from baseline.  Patient was scheduled to have a right hip replacement in the near future.  Patient denied hitting her head or LOC, she denied any preceding symptoms or symptoms after fall other than as stated.  Patient was admitted to [facility] from 6/7-11 where she was found to have an acute closed displaced periprosthetic fracture of the distal femur involving the right knee replacement.  Patient  underwent ORIF on 6/7.  Her left ankle pain was felt to be a sprain.  All of her other medical problems were at baseline.  Patient is admitted to skilled nursing facility for OT/PT.  While at skilled nursing facility patient will be followed for atrial fibrillation treated with Eliquis and Coreg, hypothyroidism treated with replacement, and hyperlipidemia treated with Lipitor.  Past Medical History:  Diagnosis Date   AC (acromioclavicular) joint bone spurs    spurs in shoulders with pain   Anxiety    Aortic insufficiency    Arthritis    all over   Chronic combined systolic and diastolic CHF (congestive heart failure) (HCC)    Constipation    Coronary artery disease    Deafness    Dysrhythmia    Fatty liver    Fibromyalgia    GERD (gastroesophageal reflux disease)    Hyperlipidemia    Hypertension    Hypothyroidism    IBS (irritable bowel syndrome)    Mitral regurgitation    Neuropathy    Non-ischemic cardiomyopathy (HCC)    a. Normal cors 01/2016.   Osteoarthritis of glenohumeral joint, left    Left shoulder   Osteopenia    Persistent atrial fibrillation    PFO (patent foramen ovale)    a. closure 03/2016.   Pre-diabetes    S/P aortic valve replacement with bioprosthetic valve 04/13/2016   23 mm [facility] Ease bovine pericardial tissue valve   S/P mitral valve repair 04/13/2016   26 mm Sorin Memo 3D ring annuloplasty   S/P tricuspid valve repair 04/13/2016   26 mm Edwards mc3 ring  annuloplasty   Tricuspid regurgitation     Past Surgical History:  Procedure Laterality Date   ABDOMINAL HYSTERECTOMY     partial   AORTIC VALVE REPLACEMENT N/A 04/13/2016   Procedure: AORTIC VALVE REPLACEMENT (AVR) implanted with Magna Ease size 23mm;  Surgeon: Purcell Nailslarence H Owen, MD;  Location: [facility] OR;  Service: Open Heart Surgery;  Laterality: N/A;   BREAST CYST EXCISION N/A 08/03/2017   pt has not had had breast surgery, can not remove this   CARDIAC  CATHETERIZATION N/A 02/18/2016   Procedure: Right/Left Heart Cath and Coronary Angiography;  Surgeon: Laurey Moralealton S McLean, MD;  [facility];  Service: Cardiovascular;  Laterality: N/A;   CARDIOVERSION N/A 10/16/2014   Procedure: CARDIOVERSION;  Surgeon: [name], MD;  [facility];  Service: Cardiovascular;  Laterality: N/A;   CARDIOVERSION N/A 12/04/2014   Procedure: CARDIOVERSION;  Surgeon: [name], MD;  Location: [facility] ENDOSCOPY;  Service: Cardiovascular;  Laterality: N/A;   COLON SURGERY     spurs   COLONOSCOPY  [id number]   COLONOSCOPY WITH PROPOFOL  04/09/2012   Procedure: COLONOSCOPY WITH PROPOFOL;  Surgeon: Charolett BumpersMartin K Johnson, MD;  Location: [facility] ENDOSCOPY;  Service: Endoscopy;  Laterality: N/A;   ESOPHAGOGASTRODUODENOSCOPY ENDOSCOPY     several times   EXCISION OF SKIN TAG N/A 08/03/2017   Procedure: EXCISION OF BILATERAL CHEST WALL SKINS TAGS;  Surgeon: Berna Bueonnor, Chelsea A, MD;  Location: [facility];  Service: General;  Laterality: N/A;   EYE SURGERY     catarcts   MAZE N/A 04/13/2016   Procedure: MAZE;  Surgeon: Purcell Nailslarence H Owen, MD;  [facility];  Service: Open Heart Surgery;  Laterality: N/A;   MITRAL VALVE REPAIR N/A 04/13/2016   Procedure: MITRAL VALVE REPAIR (MVR) using 26mm Sorin /MEMO 3D annuloplasty ring;  Surgeon: Purcell Nailslarence H Owen, MD;  Location: [facility] OR;  Service: Open Heart Surgery;  Laterality: N/A;   PATENT FORAMEN OVALE CLOSURE N/A 04/13/2016   Procedure: PATENT FORAMEN OVALE (PFO) CLOSURE;  Surgeon: Purcell Nailslarence H Owen, MD;  Location: [facility] OR;  Service: Open Heart Surgery;  Laterality: N/A;   TEE WITHOUT CARDIOVERSION N/A 02/18/2016   Procedure: TRANSESOPHAGEAL ECHOCARDIOGRAM (TEE);  Surgeon: Laurey Moralealton S McLean, MD;  [facility];  Service: Cardiovascular;  Laterality: N/A;   TEE WITHOUT CARDIOVERSION N/A 04/13/2016   Procedure: TRANSESOPHAGEAL ECHOCARDIOGRAM (TEE);  Surgeon: Purcell Nailslarence H Owen, MD;  [facility];  Service: Open Heart Surgery;  Laterality: N/A;   THYROID SURGERY  1991   TONSILLECTOMY     TOTAL KNEE ARTHROPLASTY Right 08/21/2017   Procedure: RIGHT TOTAL KNEE ARTHROPLASTY;  Surgeon: Marcene Corningalldorf, Peter, MD;  Location: [facility] OR;  Service: Orthopedics;  Laterality: Right;   TOTAL SHOULDER ARTHROPLASTY Right 07/02/2012   Procedure: TOTAL SHOULDER ARTHROPLASTY;  Surgeon: Mable ParisJustin William Chandler, MD;  [facility];  Service: Orthopedics;  Laterality: Right;  Right total shoulder arthroplasty   TOTAL SHOULDER ARTHROPLASTY Left 11/16/2016   TOTAL SHOULDER ARTHROPLASTY Left 11/16/2016   Procedure: TOTAL SHOULDER ARTHROPLASTY;  Surgeon: Jones Broomhandler, Justin, MD;  Location: [facility] OR;  Service: Orthopedics;  Laterality: Left;  Left total shoulder arthroplasty   TRICUSPID VALVE REPLACEMENT N/A 04/13/2016   Procedure: TRICUSPID VALVE REPAIR using a T26 MC3 Edwards annuloplasty ring;  Surgeon: Purcell Nailslarence H Owen, MD;  Location: [facility] OR;  Service: Open Heart Surgery;  Laterality: N/A;   TUBAL LIGATION     vericose surgery Right leg  05-1998   WISDOM TOOTH EXTRACTION  Allergies as of 10/11/2018      Reactions   Clarithromycin Nausea Only, Other (See Comments)   Dizzy, blurred vision, achy stomach Dizzy, blurred vision, stomach ache. unknown   Trazodone Other (See Comments)   Weakness in legs Numbness in arms Funny feeling Rapid heart beat Loses focus. unknown   Trazodone And Nefazodone Other (See Comments)   Weakness in legs Numbness in arms Funny feeling Rapid heart beat Lose focus   Naproxen Sodium Other (See Comments)   unknown   Sulfa Antibiotics Other (See Comments)   Blister on large toe   Sulfamethoxazole Other (See Comments)   unknown   Aleve [naproxen] Nausea And Vomiting, Other (See Comments)   Stomach ache   Doxycycline Nausea Only, Other (See Comments)   Stomach ache Stomach ache. unknown   Durabac [apap-salicyl-phenyltolox-caff] Nausea And Vomiting   Estradiol Nausea  Only, Other (See Comments)   Hurt stomach unknown   Ibuprofen Nausea Only, Other (See Comments)   Stomach ahce unknown   Macrobid [nitrofurantoin] Other (See Comments)   BLOATING   Methocarbamol Nausea Only, Other (See Comments)   Hurt stomach unknown   Oxaprozin Nausea Only, Other (See Comments)   Hurt stomach unknown   Topiramate Nausea And Vomiting, Other (See Comments)   unknown   Valdecoxib Nausea And Vomiting, Other (See Comments)   Stomach ache unknown      Medication List       Accurate as of October 11, 2018 11:05 AM. If you have any questions, ask your nurse or doctor.        STOP taking these medications   Caltrate 600+D 600-400 MG-UNIT tablet Generic drug: Calcium Carbonate-Vitamin D Stopped by: Merrilee SeashoreAnne Camdynn Maranto, MD   furosemide 40 MG tablet Commonly known as: LASIX Stopped by: Merrilee SeashoreAnne Corneshia Hines, MD   HYDROcodone-acetaminophen 5-325 MG tablet Commonly known as: Norco Stopped by: Merrilee SeashoreAnne Innocence Schlotzhauer, MD   ipratropium 0.06 % nasal spray Commonly known as: ATROVENT Stopped by: Merrilee SeashoreAnne Plez Belton, MD   LORazepam 2 MG tablet Commonly known as: ATIVAN Stopped by: Merrilee SeashoreAnne Asyah Candler, MD   potassium chloride SA 20 MEQ tablet Commonly known as: K-DUR Stopped by: Merrilee SeashoreAnne Jillene Wehrenberg, MD     TAKE these medications   acetaminophen 325 MG tablet Commonly known as: TYLENOL Take 650 mg by mouth every 6 (six) hours as needed.   apixaban 5 MG Tabs tablet Commonly known as: Eliquis Take 1 tablet (5 mg total) by mouth 2 (two) times daily.   atorvastatin 20 MG tablet Commonly known as: LIPITOR Take 20 mg by mouth daily.   bisacodyl 5 MG EC tablet Commonly known as: DULCOLAX Give 1 tablet by mouth daily as needed for constipation   Calcium 600-200 MG-UNIT tablet Take 1 tablet by mouth 2 (two) times daily.   carvedilol 12.5 MG tablet Commonly known as: COREG TAKE 1 TABLET BY MOUTH TWO  TIMES DAILY WITH MEALS   cetirizine 10 MG tablet Commonly known as: ZYRTEC Take 10 mg by  mouth daily.   diclofenac 75 MG EC tablet Commonly known as: VOLTAREN Give 1 tablet by mouth daily as needed for pain   diphenhydrAMINE 25 mg capsule Commonly known as: BENADRYL Take 25 mg by mouth daily as needed for itching.   famotidine 40 MG tablet Commonly known as: PEPCID Give 1 tablet by mouth nightly for GERD   ferrous sulfate 325 (65 FE) MG EC tablet Give 1 tablet by mouth daily   fluticasone 27.5 MCG/SPRAY nasal spray Commonly known as: VERAMYST Place 2 sprays  into the nose daily.   gabapentin 600 MG tablet Commonly known as: NEURONTIN Take 600 mg by mouth 3 (three) times daily.   levothyroxine 137 MCG tablet Commonly known as: SYNTHROID Take 137 mcg by mouth daily before breakfast.   montelukast 10 MG tablet Commonly known as: SINGULAIR Take 10 mg by mouth every evening.   omeprazole 40 MG capsule Commonly known as: PRILOSEC Take 40 mg by mouth daily.   polyethylene glycol 17 g packet Commonly known as: MIRALAX / GLYCOLAX Mix 17 gm in 4 oz of water and give by mouth daily as needed for constipation   tiZANidine 2 MG tablet Commonly known as: ZANAFLEX Take 2 tablets (4 mg total) by mouth every 6 (six) hours as needed for muscle spasms.       No orders of the defined types were placed in this encounter.   Immunization History  Administered Date(s) Administered   Influenza Split 01/29/2013   Influenza-Unspecified 02/18/2016    Social History   Tobacco Use   Smoking status: Never Smoker   Smokeless tobacco: Never Used  Substance Use Topics   Alcohol use: No    Family history is   Family History  Problem Relation Age of Onset   Lung cancer Father    Arrhythmia Father    Hypertension Father    Arrhythmia Mother    Hypertension Mother    Stroke Maternal Grandmother    Hypertension Maternal Grandmother    Heart attack Maternal Uncle        X2   Kidney disease Paternal Grandfather       Review of Systems  DATA  OBTAINED: from patient, nurse GENERAL:  no fevers, fatigue, appetite changes SKIN: No itching, or rash EYES: No eye pain, redness, discharge EARS: No earache, tinnitus, change in hearing NOSE: No congestion, drainage or bleeding  MOUTH/THROAT: No mouth or tooth pain, No sore throat RESPIRATORY: No cough, wheezing, SOB CARDIAC: No chest pain, palpitations, lower extremity edema  GI: No abdominal pain, No N/V/D or constipation, No heartburn or reflux  GU: No dysuria, frequency or urgency, or incontinence  MUSCULOSKELETAL: No unrelieved bone/joint pain NEUROLOGIC: No headache, dizziness or focal weakness PSYCHIATRIC: No c/o anxiety or sadness   Vitals:   10/11/18 1040  BP: (!) 115/52  Pulse: 90  Resp: 17  Temp: 98.1 F (36.7 C)    SpO2 Readings from Last 1 Encounters:  03/19/18 98%   There is no height or weight on file to calculate BMI.     Physical Exam  GENERAL APPEARANCE: Alert, conversant,  No acute distress.  SKIN: No diaphoresis rash HEAD: Normocephalic, atraumatic  EYES: Conjunctiva/lids clear. Pupils round, reactive. EOMs intact.  EARS: External exam WNL, canals clear. Hearing grossly normal.  NOSE: No deformity or discharge.  MOUTH/THROAT: Lips w/o lesions  RESPIRATORY: Breathing is even, unlabored. Lung sounds are clear   CARDIOVASCULAR: Heart RRR 2/4 systolic  Murmur, no rubs or gallops. No peripheral edema.   GASTROINTESTINAL: Abdomen is soft, non-tender, not distended w/ normal bowel sounds. GENITOURINARY: Bladder non tender, not distended  MUSCULOSKELETAL: NEUROLOGIC:  Cranial nerves 2-12 grossly intact. Moves all extremities  PSYCHIATRIC: Mood and affect appropriate to situation, no behavioral issues  Patient Active Problem List   Diagnosis Date Noted   Anemia 01/02/2018   Hypertension    Benign essential HTN 08/25/2017   Coronary artery disease involving native coronary artery of native heart without angina pectoris 08/25/2017   GERD without  esophagitis 08/25/2017   Other fracture  of left femur, initial encounter for closed fracture ([place]) 08/25/2017   Primary osteoarthritis of right knee 08/21/2017   Sinus pain 02/28/2017   Spider veins of both lower extremities 01/16/2017   S/P shoulder replacement, left 11/16/2016   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   Other acute sinusitis 09/15/2016   Seasonal allergic rhinitis 09/15/2016   Encounter for therapeutic drug monitoring 05/12/2016   LBBB (left bundle branch block) 05/09/2016   S/P aortic valve replacement + mitral valve repair + tricupsid valve repair + maze procedure 04/13/2016   S/P aortic valve replacement with bioprosthetic valve 04/13/2016   S/P tricuspid valve repair 04/13/2016   S/P Maze operation for atrial fibrillation 04/13/2016   Tricuspid regurgitation    Mitral regurgitation    Deafness    Aortic insufficiency    Chronic combined systolic and diastolic CHF (congestive heart failure) ([place])    Non-ischemic cardiomyopathy ([place])    Anticoagulated 01/20/2016   Persistent atrial fibrillation (HCC)    Hypothyroidism 09/17/2014   Dyspnea 11/19/2013   Edema 10/01/2013   Dysphagia 08/28/2013   Arthritis of right shoulder region 07/03/2012      Labs reviewed: Basic Metabolic Panel:    Component Value Date/Time   NA 138 12/20/2017 1136   NA 139 12/04/2017 1055   K 3.8 12/20/2017 1136   CL 98 12/20/2017 1136   CO2 31 12/20/2017 1136   GLUCOSE 102 (H) 12/20/2017 1136   BUN 10 12/20/2017 1136   BUN 11 12/04/2017 1055   CREATININE 1.03 (H) 12/20/2017 1136   CREATININE 1.42 (H) 01/20/2016 1435   CALCIUM 9.0 12/20/2017 1136   PROT 6.6 12/04/2017 1055   ALBUMIN 3.9 12/04/2017 1055   AST 8 12/04/2017 1055   ALT 5 12/04/2017 1055   ALKPHOS 130 (H) 12/04/2017 1055   BILITOT 0.2 12/04/2017 1055   GFRNONAA 55 (L) 12/20/2017 1136   GFRAA >60 12/20/2017 1136    Recent Labs    12/04/17 1055 12/20/17 1136  NA 139 138  K 4.2  3.8  CL 97 98  CO2 23 31  GLUCOSE 93 102*  BUN 11 10  CREATININE 0.97 1.03*  CALCIUM 9.3 9.0   Liver Function Tests: Recent Labs    12/04/17 1055  AST 8  ALT 5  ALKPHOS 130*  BILITOT 0.2  PROT 6.6  ALBUMIN 3.9   No results for input(s): LIPASE, AMYLASE in the last 8760 hours. No results for input(s): AMMONIA in the last 8760 hours. CBC: Recent Labs    12/20/17 1136  WBC 6.2  NEUTROABS 3.8  HGB 10.8*  HCT 37.6  MCV 82.1  PLT 308   Lipid Recent Labs    12/04/17 1055  CHOL 148  HDL 52  LDLCALC 71  TRIG 123    Cardiac Enzymes: No results for input(s): CKTOTAL, CKMB, CKMBINDEX, TROPONINI in the last 8760 hours. BNP: No results for input(s): BNP in the last 8760 hours. No results found for: [facility] Lab Results  Component Value Date   HGBA1C 5.9 (H) 04/10/2016   Lab Results  Component Value Date   TSH 2.120 12/04/2017   No results found for: VITAMINB12 No results found for: FOLATE No results found for: IRON, TIBC, FERRITIN  Imaging and Procedures obtained prior to SNF admission: Mm 3d Screen Breast Bilateral  Result Date: 03/01/2018 CLINICAL DATA:  Screening. EXAM: DIGITAL SCREENING BILATERAL MAMMOGRAM WITH TOMO AND CAD COMPARISON:  Previous exam(s). ACR Breast Density Category c: The breast tissue is heterogeneously dense, which may obscure small  masses. FINDINGS: There are no findings suspicious for malignancy. Images were processed with CAD. IMPRESSION: No mammographic evidence of malignancy. A result letter of this screening mammogram will be mailed directly to the patient. RECOMMENDATION: Screening mammogram in one year. (Code:SM-B-01Y) BI-RADS CATEGORY  1: Negative. Electronically Signed   By: Sande BrothersSerena  Chacko M.D.   On: 03/01/2018 08:40     Not all labs, radiology exams or other studies done during hospitalization come through on my EPIC note; however they are reviewed by me.    Assessment and Plan  Acute closed displaced periprosthetic  fracture of the distal femur involving the right knee replacement-status post ORIF 6/7; continue Eliquis SNF- admitted for OT/PT; patient was sent out without any pain medication, she started on Tylenol 650 mg every 6 as needed and Norco 5 mg 1 p.o. every 6 as needed which includes one 1 hour prior to physical therapy  Left ankle pain- felt to be a sprain; patient had a boot  Chronic congestive heart failure- resolved based on normal echo in 2019 Lasix never required in hospital; SNF-will monitor; continue Coreg 12.5 mg twice daily; Lasix on a as needed basis  Persistent atrial fibrillation/status post repair of tricuspid/aortic/mitral valves; status post Maze procedure for atrial fib SNF continue Eliquis and Coreg 12.5 mg twice daily  Hypertension-blood pressure was low in the hospital hence parameters for Coreg SNF- continue Coreg 12.5 mg twice daily with parameters  Hyperlipidemia NSF-not stated as uncontrolled; continue Lipitor 20 mg daily  Hypothyroidism SNF- not stated as uncontrolled; continue replacement 137 mcg daily    Time spent greater than 45 minutes;> 50% of time with patient was spent reviewing records, labs, tests and studies, counseling and developing plan of care  Margit HanksAnne D Aigner Horseman, MD

## 2018-10-13 ENCOUNTER — Encounter: Payer: Self-pay | Admitting: Internal Medicine

## 2018-10-13 DIAGNOSIS — M978XXD Periprosthetic fracture around other internal prosthetic joint, subsequent encounter: Secondary | ICD-10-CM | POA: Insufficient documentation

## 2018-10-13 DIAGNOSIS — M25572 Pain in left ankle and joints of left foot: Secondary | ICD-10-CM | POA: Insufficient documentation

## 2018-10-13 DIAGNOSIS — E785 Hyperlipidemia, unspecified: Secondary | ICD-10-CM | POA: Insufficient documentation

## 2018-10-25 ENCOUNTER — Emergency Department (HOSPITAL_COMMUNITY): Payer: Medicare Other

## 2018-10-25 ENCOUNTER — Encounter (HOSPITAL_COMMUNITY): Payer: Self-pay

## 2018-10-25 ENCOUNTER — Inpatient Hospital Stay (HOSPITAL_COMMUNITY)
Admission: EM | Admit: 2018-10-25 | Discharge: 2018-11-30 | DRG: 177 | Disposition: E | Payer: Medicare Other | Attending: Internal Medicine | Admitting: Internal Medicine

## 2018-10-25 ENCOUNTER — Other Ambulatory Visit: Payer: Self-pay

## 2018-10-25 DIAGNOSIS — Z79890 Hormone replacement therapy (postmenopausal): Secondary | ICD-10-CM

## 2018-10-25 DIAGNOSIS — R042 Hemoptysis: Secondary | ICD-10-CM | POA: Diagnosis present

## 2018-10-25 DIAGNOSIS — I5033 Acute on chronic diastolic (congestive) heart failure: Secondary | ICD-10-CM | POA: Diagnosis present

## 2018-10-25 DIAGNOSIS — K760 Fatty (change of) liver, not elsewhere classified: Secondary | ICD-10-CM | POA: Diagnosis present

## 2018-10-25 DIAGNOSIS — D509 Iron deficiency anemia, unspecified: Secondary | ICD-10-CM | POA: Diagnosis present

## 2018-10-25 DIAGNOSIS — J1289 Other viral pneumonia: Secondary | ICD-10-CM | POA: Diagnosis present

## 2018-10-25 DIAGNOSIS — E034 Atrophy of thyroid (acquired): Secondary | ICD-10-CM

## 2018-10-25 DIAGNOSIS — Z66 Do not resuscitate: Secondary | ICD-10-CM | POA: Diagnosis present

## 2018-10-25 DIAGNOSIS — Z7901 Long term (current) use of anticoagulants: Secondary | ICD-10-CM | POA: Diagnosis not present

## 2018-10-25 DIAGNOSIS — Z791 Long term (current) use of non-steroidal anti-inflammatories (NSAID): Secondary | ICD-10-CM

## 2018-10-25 DIAGNOSIS — I2510 Atherosclerotic heart disease of native coronary artery without angina pectoris: Secondary | ICD-10-CM | POA: Diagnosis present

## 2018-10-25 DIAGNOSIS — E785 Hyperlipidemia, unspecified: Secondary | ICD-10-CM | POA: Diagnosis present

## 2018-10-25 DIAGNOSIS — J80 Acute respiratory distress syndrome: Secondary | ICD-10-CM | POA: Diagnosis present

## 2018-10-25 DIAGNOSIS — M797 Fibromyalgia: Secondary | ICD-10-CM | POA: Diagnosis present

## 2018-10-25 DIAGNOSIS — H9193 Unspecified hearing loss, bilateral: Secondary | ICD-10-CM | POA: Diagnosis present

## 2018-10-25 DIAGNOSIS — Z23 Encounter for immunization: Secondary | ICD-10-CM | POA: Diagnosis not present

## 2018-10-25 DIAGNOSIS — I4819 Other persistent atrial fibrillation: Secondary | ICD-10-CM | POA: Diagnosis present

## 2018-10-25 DIAGNOSIS — I110 Hypertensive heart disease with heart failure: Secondary | ICD-10-CM | POA: Diagnosis present

## 2018-10-25 DIAGNOSIS — H9190 Unspecified hearing loss, unspecified ear: Secondary | ICD-10-CM | POA: Diagnosis present

## 2018-10-25 DIAGNOSIS — I82409 Acute embolism and thrombosis of unspecified deep veins of unspecified lower extremity: Secondary | ICD-10-CM | POA: Diagnosis not present

## 2018-10-25 DIAGNOSIS — I959 Hypotension, unspecified: Secondary | ICD-10-CM | POA: Diagnosis present

## 2018-10-25 DIAGNOSIS — Z79899 Other long term (current) drug therapy: Secondary | ICD-10-CM

## 2018-10-25 DIAGNOSIS — X58XXXA Exposure to other specified factors, initial encounter: Secondary | ICD-10-CM | POA: Diagnosis present

## 2018-10-25 DIAGNOSIS — E878 Other disorders of electrolyte and fluid balance, not elsewhere classified: Secondary | ICD-10-CM | POA: Diagnosis present

## 2018-10-25 DIAGNOSIS — A419 Sepsis, unspecified organism: Secondary | ICD-10-CM | POA: Diagnosis present

## 2018-10-25 DIAGNOSIS — J440 Chronic obstructive pulmonary disease with acute lower respiratory infection: Secondary | ICD-10-CM | POA: Diagnosis present

## 2018-10-25 DIAGNOSIS — Z8774 Personal history of (corrected) congenital malformations of heart and circulatory system: Secondary | ICD-10-CM

## 2018-10-25 DIAGNOSIS — M8580 Other specified disorders of bone density and structure, unspecified site: Secondary | ICD-10-CM | POA: Diagnosis present

## 2018-10-25 DIAGNOSIS — Z8249 Family history of ischemic heart disease and other diseases of the circulatory system: Secondary | ICD-10-CM

## 2018-10-25 DIAGNOSIS — U071 COVID-19: Secondary | ICD-10-CM | POA: Diagnosis present

## 2018-10-25 DIAGNOSIS — E871 Hypo-osmolality and hyponatremia: Secondary | ICD-10-CM | POA: Diagnosis present

## 2018-10-25 DIAGNOSIS — R0602 Shortness of breath: Secondary | ICD-10-CM

## 2018-10-25 DIAGNOSIS — E039 Hypothyroidism, unspecified: Secondary | ICD-10-CM | POA: Diagnosis present

## 2018-10-25 DIAGNOSIS — D696 Thrombocytopenia, unspecified: Secondary | ICD-10-CM | POA: Diagnosis present

## 2018-10-25 DIAGNOSIS — Z953 Presence of xenogenic heart valve: Secondary | ICD-10-CM

## 2018-10-25 DIAGNOSIS — Z801 Family history of malignant neoplasm of trachea, bronchus and lung: Secondary | ICD-10-CM

## 2018-10-25 DIAGNOSIS — I5042 Chronic combined systolic (congestive) and diastolic (congestive) heart failure: Secondary | ICD-10-CM | POA: Diagnosis not present

## 2018-10-25 DIAGNOSIS — K219 Gastro-esophageal reflux disease without esophagitis: Secondary | ICD-10-CM | POA: Diagnosis present

## 2018-10-25 DIAGNOSIS — M19012 Primary osteoarthritis, left shoulder: Secondary | ICD-10-CM | POA: Diagnosis present

## 2018-10-25 DIAGNOSIS — J9601 Acute respiratory failure with hypoxia: Secondary | ICD-10-CM | POA: Diagnosis not present

## 2018-10-25 DIAGNOSIS — Z823 Family history of stroke: Secondary | ICD-10-CM

## 2018-10-25 DIAGNOSIS — M7989 Other specified soft tissue disorders: Secondary | ICD-10-CM | POA: Diagnosis not present

## 2018-10-25 DIAGNOSIS — I480 Paroxysmal atrial fibrillation: Secondary | ICD-10-CM | POA: Diagnosis present

## 2018-10-25 DIAGNOSIS — Z96612 Presence of left artificial shoulder joint: Secondary | ICD-10-CM | POA: Diagnosis present

## 2018-10-25 DIAGNOSIS — J1282 Pneumonia due to coronavirus disease 2019: Secondary | ICD-10-CM

## 2018-10-25 DIAGNOSIS — Z96611 Presence of right artificial shoulder joint: Secondary | ICD-10-CM | POA: Diagnosis present

## 2018-10-25 DIAGNOSIS — S72401D Unspecified fracture of lower end of right femur, subsequent encounter for closed fracture with routine healing: Secondary | ICD-10-CM

## 2018-10-25 LAB — COMPREHENSIVE METABOLIC PANEL
ALT: 11 U/L (ref 0–44)
AST: 25 U/L (ref 15–41)
Albumin: 2.6 g/dL — ABNORMAL LOW (ref 3.5–5.0)
Alkaline Phosphatase: 94 U/L (ref 38–126)
Anion gap: 11 (ref 5–15)
BUN: 11 mg/dL (ref 8–23)
CO2: 27 mmol/L (ref 22–32)
Calcium: 7.9 mg/dL — ABNORMAL LOW (ref 8.9–10.3)
Chloride: 92 mmol/L — ABNORMAL LOW (ref 98–111)
Creatinine, Ser: 0.95 mg/dL (ref 0.44–1.00)
GFR calc Af Amer: >60 mL/min (ref >60)
GFR calc non Af Amer: >60 mL/min (ref >60)
Glucose, Bld: 90 mg/dL (ref 70–99)
Potassium: 3.9 mmol/L (ref 3.5–5.1)
Sodium: 130 mmol/L — ABNORMAL LOW (ref 135–145)
Total Bilirubin: 0.5 mg/dL (ref 0.3–1.2)
Total Protein: 5.6 g/dL — ABNORMAL LOW (ref 6.5–8.1)

## 2018-10-25 LAB — D-DIMER, QUANTITATIVE: D-Dimer, Quant: 2.62 ug/mL-FEU — ABNORMAL HIGH (ref 0.00–0.50)

## 2018-10-25 LAB — CBC WITH DIFFERENTIAL/PLATELET
Abs Immature Granulocytes: 0.01 10*3/uL (ref 0.00–0.07)
Basophils Absolute: 0 10*3/uL (ref 0.0–0.1)
Basophils Relative: 0 %
Eosinophils Absolute: 0 10*3/uL (ref 0.0–0.5)
Eosinophils Relative: 0 %
HCT: 27 % — ABNORMAL LOW (ref 36.0–46.0)
Hemoglobin: 8.2 g/dL — ABNORMAL LOW (ref 12.0–15.0)
Immature Granulocytes: 0 %
Lymphocytes Relative: 25 %
Lymphs Abs: 1 10*3/uL (ref 0.7–4.0)
MCH: 26.4 pg (ref 26.0–34.0)
MCHC: 30.4 g/dL (ref 30.0–36.0)
MCV: 86.8 fL (ref 80.0–100.0)
Monocytes Absolute: 0.3 10*3/uL (ref 0.1–1.0)
Monocytes Relative: 7 %
Neutro Abs: 2.6 10*3/uL (ref 1.7–7.7)
Neutrophils Relative %: 68 %
Platelets: 233 10*3/uL (ref 150–400)
RBC: 3.11 MIL/uL — ABNORMAL LOW (ref 3.87–5.11)
RDW: 16.8 % — ABNORMAL HIGH (ref 11.5–15.5)
WBC: 3.8 10*3/uL — ABNORMAL LOW (ref 4.0–10.5)
nRBC: 0 % (ref 0.0–0.2)

## 2018-10-25 LAB — FIBRINOGEN: Fibrinogen: 743 mg/dL — ABNORMAL HIGH (ref 210–475)

## 2018-10-25 LAB — PROCALCITONIN: Procalcitonin: 0.47 ng/mL

## 2018-10-25 LAB — LACTIC ACID, PLASMA
Lactic Acid, Venous: 0.9 mmol/L (ref 0.5–1.9)
Lactic Acid, Venous: 1 mmol/L (ref 0.5–1.9)

## 2018-10-25 LAB — LACTATE DEHYDROGENASE: LDH: 313 U/L — ABNORMAL HIGH (ref 98–192)

## 2018-10-25 LAB — C-REACTIVE PROTEIN: CRP: 17.9 mg/dL — ABNORMAL HIGH (ref <1.0)

## 2018-10-25 LAB — TRIGLYCERIDES: Triglycerides: 64 mg/dL (ref <150)

## 2018-10-25 LAB — BRAIN NATRIURETIC PEPTIDE: B Natriuretic Peptide: 174.2 pg/mL — ABNORMAL HIGH (ref 0.0–100.0)

## 2018-10-25 LAB — FERRITIN: Ferritin: 350 ng/mL — ABNORMAL HIGH (ref 11–307)

## 2018-10-25 MED ORDER — IPRATROPIUM-ALBUTEROL 20-100 MCG/ACT IN AERS
1.0000 | INHALATION_SPRAY | Freq: Four times a day (QID) | RESPIRATORY_TRACT | Status: DC
  Administered 2018-10-25 – 2018-10-27 (×10): 1 via RESPIRATORY_TRACT
  Filled 2018-10-25: qty 4

## 2018-10-25 MED ORDER — POLYETHYLENE GLYCOL 3350 17 G PO PACK
17.0000 g | PACK | Freq: Every day | ORAL | Status: DC
  Administered 2018-10-25 – 2018-11-03 (×9): 17 g via ORAL
  Filled 2018-10-25 (×9): qty 1

## 2018-10-25 MED ORDER — ACETAMINOPHEN 325 MG PO TABS
650.0000 mg | ORAL_TABLET | Freq: Four times a day (QID) | ORAL | Status: DC | PRN
  Administered 2018-10-27 – 2018-11-03 (×5): 650 mg via ORAL
  Filled 2018-10-25 (×5): qty 2

## 2018-10-25 MED ORDER — SODIUM CHLORIDE 0.9 % IV SOLN
500.0000 mg | INTRAVENOUS | Status: DC
  Administered 2018-10-25: 500 mg via INTRAVENOUS
  Filled 2018-10-25: qty 500

## 2018-10-25 MED ORDER — CALCIUM CARBONATE-VITAMIN D 500-200 MG-UNIT PO TABS
1.0000 | ORAL_TABLET | Freq: Two times a day (BID) | ORAL | Status: DC
  Administered 2018-10-25 – 2018-11-04 (×21): 1 via ORAL
  Filled 2018-10-25 (×23): qty 1

## 2018-10-25 MED ORDER — GABAPENTIN 300 MG PO CAPS
600.0000 mg | ORAL_CAPSULE | Freq: Three times a day (TID) | ORAL | Status: DC
  Administered 2018-10-25 – 2018-11-05 (×34): 600 mg via ORAL
  Filled 2018-10-25 (×37): qty 2

## 2018-10-25 MED ORDER — FERROUS SULFATE 325 (65 FE) MG PO TABS
325.0000 mg | ORAL_TABLET | Freq: Every day | ORAL | Status: DC
  Administered 2018-10-26 – 2018-10-31 (×6): 325 mg via ORAL
  Filled 2018-10-25 (×7): qty 1

## 2018-10-25 MED ORDER — LORAZEPAM 2 MG/ML IJ SOLN
0.2500 mg | INTRAMUSCULAR | Status: AC | PRN
  Administered 2018-10-25 – 2018-10-26 (×4): 0.25 mg via INTRAVENOUS
  Filled 2018-10-25 (×4): qty 1

## 2018-10-25 MED ORDER — BISACODYL 5 MG PO TBEC
5.0000 mg | DELAYED_RELEASE_TABLET | Freq: Every day | ORAL | Status: DC | PRN
  Administered 2018-10-27 – 2018-10-29 (×2): 5 mg via ORAL
  Filled 2018-10-25 (×2): qty 1

## 2018-10-25 MED ORDER — FAMOTIDINE 20 MG PO TABS
20.0000 mg | ORAL_TABLET | Freq: Two times a day (BID) | ORAL | Status: DC
  Administered 2018-10-25 – 2018-11-04 (×22): 20 mg via ORAL
  Filled 2018-10-25 (×22): qty 1

## 2018-10-25 MED ORDER — SODIUM CHLORIDE 0.9 % IV SOLN
200.0000 mg | Freq: Once | INTRAVENOUS | Status: AC
  Administered 2018-10-25: 200 mg via INTRAVENOUS
  Filled 2018-10-25: qty 40

## 2018-10-25 MED ORDER — SODIUM CHLORIDE 0.9 % IV SOLN
INTRAVENOUS | Status: AC
  Administered 2018-10-25 – 2018-10-26 (×3): via INTRAVENOUS

## 2018-10-25 MED ORDER — POLYETHYLENE GLYCOL 3350 17 G PO PACK: 17.0000 g | PACK | Freq: Every day | ORAL | Status: DC | PRN

## 2018-10-25 MED ORDER — HYDROCODONE-ACETAMINOPHEN 5-325 MG PO TABS
1.0000 | ORAL_TABLET | Freq: Four times a day (QID) | ORAL | Status: DC | PRN
  Administered 2018-10-25 – 2018-11-05 (×15): 1 via ORAL
  Filled 2018-10-25 (×15): qty 1

## 2018-10-25 MED ORDER — FERROUS SULFATE 325 (65 FE) MG PO TBEC: 325.0000 mg | DELAYED_RELEASE_TABLET | Freq: Every day | ORAL | Status: DC

## 2018-10-25 MED ORDER — SODIUM CHLORIDE 0.9 % IV SOLN
100.0000 mg | INTRAVENOUS | Status: AC
  Administered 2018-10-26 – 2018-10-29 (×4): 100 mg via INTRAVENOUS
  Filled 2018-10-25 (×4): qty 20

## 2018-10-25 MED ORDER — ATORVASTATIN CALCIUM 10 MG PO TABS
20.0000 mg | ORAL_TABLET | Freq: Every day | ORAL | Status: DC
  Administered 2018-10-25 – 2018-11-04 (×11): 20 mg via ORAL
  Filled 2018-10-25 (×11): qty 2

## 2018-10-25 MED ORDER — METHYLPREDNISOLONE SODIUM SUCC 125 MG IJ SOLR
60.0000 mg | Freq: Three times a day (TID) | INTRAMUSCULAR | Status: DC
  Administered 2018-10-25: 60 mg via INTRAVENOUS
  Filled 2018-10-25 (×2): qty 2

## 2018-10-25 MED ORDER — APIXABAN 5 MG PO TABS
5.0000 mg | ORAL_TABLET | Freq: Two times a day (BID) | ORAL | Status: DC
  Administered 2018-10-26: 5 mg via ORAL
  Filled 2018-10-25: qty 1

## 2018-10-25 MED ORDER — LORATADINE 10 MG PO TABS
10.0000 mg | ORAL_TABLET | Freq: Every day | ORAL | Status: DC
  Administered 2018-10-25 – 2018-11-04 (×11): 10 mg via ORAL
  Filled 2018-10-25 (×12): qty 1

## 2018-10-25 MED ORDER — ACETAMINOPHEN 650 MG RE SUPP
650.0000 mg | Freq: Once | RECTAL | Status: AC
  Administered 2018-10-25: 650 mg via RECTAL
  Filled 2018-10-25: qty 1

## 2018-10-25 MED ORDER — ONDANSETRON HCL 4 MG/2ML IJ SOLN
4.0000 mg | Freq: Four times a day (QID) | INTRAMUSCULAR | Status: DC | PRN
  Administered 2018-10-25 – 2018-11-01 (×5): 4 mg via INTRAVENOUS
  Filled 2018-10-25 (×5): qty 2

## 2018-10-25 MED ORDER — SODIUM CHLORIDE 0.9 % IV SOLN
2.0000 g | INTRAVENOUS | Status: DC
  Administered 2018-10-25: 2 g via INTRAVENOUS
  Filled 2018-10-25: qty 20

## 2018-10-25 MED ORDER — ONDANSETRON HCL 4 MG/2ML IJ SOLN
4.0000 mg | Freq: Once | INTRAMUSCULAR | Status: AC
  Administered 2018-10-25: 4 mg via INTRAVENOUS
  Filled 2018-10-25: qty 2

## 2018-10-25 MED ORDER — GUAIFENESIN-DM 100-10 MG/5ML PO SYRP
10.0000 mL | ORAL_SOLUTION | ORAL | Status: DC | PRN
  Administered 2018-10-25 – 2018-11-04 (×6): 10 mL via ORAL
  Filled 2018-10-25 (×6): qty 10

## 2018-10-25 MED ORDER — TRAMADOL HCL 50 MG PO TABS: 100.0000 mg | ORAL_TABLET | Freq: Four times a day (QID) | ORAL | Status: DC | PRN

## 2018-10-25 MED ORDER — DIPHENHYDRAMINE HCL 25 MG PO CAPS
25.0000 mg | ORAL_CAPSULE | Freq: Every day | ORAL | Status: DC | PRN
  Administered 2018-11-03: 25 mg via ORAL
  Filled 2018-10-25: qty 1

## 2018-10-25 MED ORDER — HYDROCOD POLST-CPM POLST ER 10-8 MG/5ML PO SUER
5.0000 mL | Freq: Two times a day (BID) | ORAL | Status: DC | PRN
  Administered 2018-10-25 – 2018-11-04 (×7): 5 mL via ORAL
  Filled 2018-10-25 (×8): qty 5

## 2018-10-25 MED ORDER — FLUTICASONE PROPIONATE 50 MCG/ACT NA SUSP
2.0000 | Freq: Every day | NASAL | Status: DC
  Administered 2018-10-25 – 2018-11-02 (×6): 2 via NASAL
  Filled 2018-10-25: qty 16

## 2018-10-25 MED ORDER — FUROSEMIDE 10 MG/ML IJ SOLN
40.0000 mg | Freq: Once | INTRAMUSCULAR | Status: AC
  Administered 2018-10-25: 40 mg via INTRAVENOUS
  Filled 2018-10-25: qty 4

## 2018-10-25 MED ORDER — TOCILIZUMAB 400 MG/20ML IV SOLN
700.0000 mg | Freq: Once | INTRAVENOUS | Status: AC
  Administered 2018-10-26: 700 mg via INTRAVENOUS
  Filled 2018-10-25: qty 35

## 2018-10-25 MED ORDER — APIXABAN 5 MG PO TABS
5.0000 mg | ORAL_TABLET | Freq: Two times a day (BID) | ORAL | Status: DC
  Filled 2018-10-25 (×2): qty 1

## 2018-10-25 MED ORDER — METHYLPREDNISOLONE SODIUM SUCC 40 MG IJ SOLR
40.0000 mg | Freq: Two times a day (BID) | INTRAMUSCULAR | Status: DC
  Administered 2018-10-26 – 2018-11-05 (×21): 40 mg via INTRAVENOUS
  Filled 2018-10-25 (×21): qty 1

## 2018-10-25 MED ORDER — MONTELUKAST SODIUM 10 MG PO TABS
10.0000 mg | ORAL_TABLET | Freq: Every evening | ORAL | Status: DC
  Administered 2018-10-25 – 2018-11-04 (×11): 10 mg via ORAL
  Filled 2018-10-25 (×12): qty 1

## 2018-10-25 MED ORDER — LEVOTHYROXINE SODIUM 137 MCG PO TABS
137.0000 ug | ORAL_TABLET | Freq: Every day | ORAL | Status: DC
  Administered 2018-10-26 – 2018-11-04 (×9): 137 ug via ORAL
  Filled 2018-10-25 (×11): qty 1

## 2018-10-25 MED ORDER — SODIUM CHLORIDE 0.9 % IV BOLUS
1000.0000 mL | Freq: Once | INTRAVENOUS | Status: AC
  Administered 2018-10-25: 1000 mL via INTRAVENOUS

## 2018-10-25 MED ORDER — ONDANSETRON HCL 4 MG PO TABS: 4.0000 mg | ORAL_TABLET | Freq: Four times a day (QID) | ORAL | Status: DC | PRN

## 2018-10-25 MED ORDER — TIZANIDINE HCL 4 MG PO TABS
4.0000 mg | ORAL_TABLET | Freq: Four times a day (QID) | ORAL | Status: DC | PRN
  Administered 2018-10-25 – 2018-10-28 (×5): 4 mg via ORAL
  Filled 2018-10-25 (×6): qty 1

## 2018-10-25 NOTE — ED Provider Notes (Signed)
Patient care assumed at 0700. Patient from [place] rehab facility, recently tested positive for COVID-19 infection here with shortness of breath, new oxygen requirement. She is comfortable appearing on examination but does have significant hypoxia when her non-rebreather is removed. Discussed with Dr. Tamala Julian with the hospital service, plan to admit to the [facility] for ongoing treatment. Discussed with the patient's daughter, Biagio Quint current treatment plan and findings.   Quintella Reichert, MD 10/06/2018 [phone]

## 2018-10-25 NOTE — H&P (Addendum)
History and Physical    [name] MEQ:[id number] DOB: [birth year] DOA: 11/06/2018  Referring MD/NP/PA: Quintella Reichert, MD PCP: Wenda Low, MD  Patient coming from: [facility] via EMS  Chief Complaint: Coughing up blood  I have personally briefly reviewed patient's old medical records in [place]   HPI: Allison [name] is a 68 y.o. female with medical history significant of HTN, HLD, CAD, combined systolic and diastolic CHF, s/p aortic and tricuspid valve replacement, paroxysmal atrial fibrillation, COPD, hypothyroidism, and deafness; who presents with complaints of coughing up blood.   History is obtained with use of sign language interpreter services.  She is normally not on oxygen at baseline.  She had recently had fallen suffering a right distal femur fracture repaired with ORIF at [facility] on 6/7.  Thereafter she had gone to [place] for rehab.  2 days ago she tested positive for COVID-19. Patient reports that she has had a productive cough over the last week that has been progressively worsening. She now reports coughing up visible blood within sputum.  She has been placed back on Eliquis following the procedure.  Associated symptoms include subjective fever, generalized malaise, decreased oral intake, abdominal pain, joint/leg pain from recent surgery, and anxiousness.  Denies having any nausea, vomiting, dysuria, or diarrhea.  Her last bowel movement was 2-3 days ago.  ED Course: Upon admission into the emergency department patient was noted to be febrile up to 100.5 F, blood pressure 90/45-96/48, O2 saturations 95-100% on nonrebreather at 15 L, and all other vital signs maintained.  Labs revealed WBC 3.8, hemoglobin 8.2, sodium 130, chloride 92, albumin 2.6, lactic acid 0.9, and rest of CMP within normal limits.  Chest x-ray showing a bilateral pneumonia.  Patient was ordered 1 L of normal saline IV fluids and 650 mg of Tylenol.  TRH  called to admit.  Review of Systems  Constitutional: Positive for malaise/fatigue. Negative for chills and fever.  HENT: Positive for congestion and ear discharge.   Eyes: Negative for photophobia and pain.  Respiratory: Positive for cough, sputum production and shortness of breath.   Cardiovascular: Negative for chest pain and leg swelling.  Gastrointestinal: Positive for abdominal pain. Negative for diarrhea, nausea and vomiting.  Genitourinary: Negative for dysuria and hematuria.  Musculoskeletal: Positive for joint pain and myalgias. Negative for falls.  Skin: Negative for itching and rash.  Neurological: Positive for weakness. Negative for focal weakness and loss of consciousness.  Endo/Heme/Allergies: Positive for environmental allergies.  Psychiatric/Behavioral: Negative for substance abuse. The patient is nervous/anxious.     Past Medical History:  Diagnosis Date   AC (acromioclavicular) joint bone spurs    spurs in shoulders with pain   Anxiety    Aortic insufficiency    Arthritis    all over   Chronic combined systolic and diastolic CHF (congestive heart failure) (HCC)    Constipation    Coronary artery disease    Deafness    Dysrhythmia    Fatty liver    Fibromyalgia    GERD (gastroesophageal reflux disease)    Hyperlipidemia    Hypertension    Hypothyroidism    IBS (irritable bowel syndrome)    Mitral regurgitation    Neuropathy    Non-ischemic cardiomyopathy ([place])    a. Normal cors 01/2016.   Osteoarthritis of glenohumeral joint, left    Left shoulder   Osteopenia    Persistent atrial fibrillation    PFO (patent foramen ovale)  a. closure 03/2016.   Pre-diabetes    S/P aortic valve replacement with bioprosthetic valve 04/13/2016   23 mm [facility] Ease bovine pericardial tissue valve   S/P mitral valve repair 04/13/2016   26 mm Sorin Memo 3D ring annuloplasty   S/P tricuspid valve repair 04/13/2016   26 mm Edwards mc3  ring annuloplasty   Tricuspid regurgitation     Past Surgical History:  Procedure Laterality Date   ABDOMINAL HYSTERECTOMY     partial   AORTIC VALVE REPLACEMENT N/A 04/13/2016   Procedure: AORTIC VALVE REPLACEMENT (AVR) implanted with Magna Ease size 3mm;  Surgeon: Purcell Nails, MD;  Location: [facility] OR;  Service: Open Heart Surgery;  Laterality: N/A;   BREAST CYST EXCISION N/A 08/03/2017   pt has not had had breast surgery, can not remove this   CARDIAC CATHETERIZATION N/A 02/18/2016   Procedure: Right/Left Heart Cath and Coronary Angiography;  Surgeon: Laurey Morale, MD;  Location: [facility] INVASIVE CV LAB;  Service: Cardiovascular;  Laterality: N/A;   CARDIOVERSION N/A 10/16/2014   Procedure: CARDIOVERSION;  Surgeon: Corky Crafts, MD;  Location: [facility] ENDOSCOPY;  Service: Cardiovascular;  Laterality: N/A;   CARDIOVERSION N/A 12/04/2014   Procedure: CARDIOVERSION;  Surgeon: Thurmon Fair, MD;  Location: [facility] ENDOSCOPY;  Service: Cardiovascular;  Laterality: N/A;   COLON SURGERY     spurs   COLONOSCOPY  [id number]   COLONOSCOPY WITH PROPOFOL  04/09/2012   Procedure: COLONOSCOPY WITH PROPOFOL;  Surgeon: Charolett Bumpers, MD;  Location: [facility] ENDOSCOPY;  Service: Endoscopy;  Laterality: N/A;   ESOPHAGOGASTRODUODENOSCOPY ENDOSCOPY     several times   EXCISION OF SKIN TAG N/A 08/03/2017   Procedure: EXCISION OF BILATERAL CHEST WALL SKINS TAGS;  Surgeon: Berna Bue, MD;  Location: [facility];  Service: General;  Laterality: N/A;   EYE SURGERY     catarcts   MAZE N/A 04/13/2016   Procedure: MAZE;  Surgeon: Purcell Nails, MD;  Location: [facility] OR;  Service: Open Heart Surgery;  Laterality: N/A;   MITRAL VALVE REPAIR N/A 04/13/2016   Procedure: MITRAL VALVE REPAIR (MVR) using 68mm Sorin /MEMO 3D annuloplasty ring;  Surgeon: Purcell Nails, MD;  Location: [facility] OR;  Service: Open Heart Surgery;  Laterality: N/A;   PATENT FORAMEN OVALE CLOSURE N/A 04/13/2016    Procedure: PATENT FORAMEN OVALE (PFO) CLOSURE;  Surgeon: Purcell Nails, MD;  Location: [facility] OR;  Service: Open Heart Surgery;  Laterality: N/A;   TEE WITHOUT CARDIOVERSION N/A 02/18/2016   Procedure: TRANSESOPHAGEAL ECHOCARDIOGRAM (TEE);  Surgeon: Laurey Morale, MD;  Location: [facility] ENDOSCOPY;  Service: Cardiovascular;  Laterality: N/A;   TEE WITHOUT CARDIOVERSION N/A 04/13/2016   Procedure: TRANSESOPHAGEAL ECHOCARDIOGRAM (TEE);  Surgeon: Purcell Nails, MD;  Location: [facility] OR;  Service: Open Heart Surgery;  Laterality: N/A;   THYROID SURGERY  1991   TONSILLECTOMY     TOTAL KNEE ARTHROPLASTY Right 08/21/2017   Procedure: RIGHT TOTAL KNEE ARTHROPLASTY;  Surgeon: Marcene Corning, MD;  Location: [facility] OR;  Service: Orthopedics;  Laterality: Right;   TOTAL SHOULDER ARTHROPLASTY Right 07/02/2012   Procedure: TOTAL SHOULDER ARTHROPLASTY;  Surgeon: Mable Paris, MD;  Location: [facility] OR;  Service: Orthopedics;  Laterality: Right;  Right total shoulder arthroplasty   TOTAL SHOULDER ARTHROPLASTY Left 11/16/2016   TOTAL SHOULDER ARTHROPLASTY Left 11/16/2016   Procedure: TOTAL SHOULDER ARTHROPLASTY;  Surgeon: Jones Broom, MD;  Location: [facility] OR;  Service: Orthopedics;  Laterality: Left;  Left total shoulder arthroplasty   TRICUSPID  VALVE REPLACEMENT N/A 04/13/2016   Procedure: TRICUSPID VALVE REPAIR using a T26 MC3 Edwards annuloplasty ring;  Surgeon: Purcell Nailslarence H Owen, MD;  Location: [facility] OR;  Service: Open Heart Surgery;  Laterality: N/A;   TUBAL LIGATION     vericose surgery Right leg  05-1998   WISDOM TOOTH EXTRACTION       reports that she has never smoked. She has never used smokeless tobacco. She reports that she does not drink alcohol or use drugs.  Allergies  Allergen Reactions   Clarithromycin Nausea Only and Other (See Comments)    Dizzy, blurred vision, achy stomach Dizzy, blurred vision, stomach ache. unknown    Trazodone Other (See Comments)    Weakness in legs Numbness  in arms Funny feeling Rapid heart beat Loses focus. unknown    Trazodone And Nefazodone Other (See Comments)    Weakness in legs Numbness in arms Funny feeling Rapid heart beat Lose focus   Naproxen Sodium Other (See Comments)    unknown   Sulfa Antibiotics Other (See Comments)    Blister on large toe   Sulfamethoxazole Other (See Comments)    unknown   Aleve [Naproxen] Nausea And Vomiting and Other (See Comments)    Stomach ache   Doxycycline Nausea Only and Other (See Comments)    Stomach ache Stomach ache. unknown    Durabac [Apap-Salicyl-Phenyltolox-Caff] Nausea And Vomiting   Estradiol Nausea Only and Other (See Comments)    Hurt stomach unknown   Ibuprofen Nausea Only and Other (See Comments)    Stomach ahce unknown   Macrobid [Nitrofurantoin] Other (See Comments)    BLOATING   Methocarbamol Nausea Only and Other (See Comments)    Hurt stomach unknown   Oxaprozin Nausea Only and Other (See Comments)    Hurt stomach unknown   Topiramate Nausea And Vomiting and Other (See Comments)    unknown   Valdecoxib Nausea And Vomiting and Other (See Comments)    Stomach ache  unknown    Family History  Problem Relation Age of Onset   Lung cancer Father    Arrhythmia Father    Hypertension Father    Arrhythmia Mother    Hypertension Mother    Stroke Maternal Grandmother    Hypertension Maternal Grandmother    Heart attack Maternal Uncle        X2   Kidney disease Paternal Grandfather     Prior to Admission medications   Medication Sig Start Date End Date Taking? Authorizing Provider  acetaminophen (TYLENOL) 325 MG tablet Take 650 mg by mouth every 6 (six) hours as needed.    [provider]  apixaban (ELIQUIS) 5 MG TABS tablet Take 1 tablet (5 mg total) by mouth 2 (two) times daily. 08/02/18   [name], [name], MD  atorvastatin (LIPITOR) 20 MG tablet Take 20 mg by mouth daily.    [provider]  bisacodyl  (DULCOLAX) 5 MG EC tablet Give 1 tablet by mouth daily as needed for constipation    [provider]  Calcium 600-200 MG-UNIT tablet Take 1 tablet by mouth 2 (two) times daily.    [provider]  carvedilol (COREG) 12.5 MG tablet TAKE 1 TABLET BY MOUTH TWO  TIMES DAILY WITH MEALS 10/16/17   [name], [name], MD  cetirizine (ZYRTEC) 10 MG tablet Take 10 mg by mouth daily.    [provider]  diclofenac (VOLTAREN) 75 MG EC tablet Give 1 tablet by mouth daily as needed for pain  [provider]  diphenhydrAMINE (BENADRYL) 25 mg capsule Take 25 mg by mouth daily as needed for itching.     [provider]  famotidine (PEPCID) 40 MG tablet Give 1 tablet by mouth nightly for GERD    [provider]  ferrous sulfate 325 (65 FE) MG EC tablet Give 1 tablet by mouth daily    [provider]  fluticasone (VERAMYST) 27.5 MCG/SPRAY nasal spray Place 2 sprays into the nose daily.    [provider]  gabapentin (NEURONTIN) 600 MG tablet Take 600 mg by mouth 3 (three) times daily.     [provider]  levothyroxine (SYNTHROID, LEVOTHROID) 137 MCG tablet Take 137 mcg by mouth daily before breakfast.     [provider]  montelukast (SINGULAIR) 10 MG tablet Take 10 mg by mouth every evening.     [provider]  omeprazole (PRILOSEC) 40 MG capsule Take 40 mg by mouth daily.  07/12/13   [provider]  polyethylene glycol (MIRALAX / GLYCOLAX) 17 g packet Mix 17 gm in 4 oz of water and give by mouth daily as needed for constipation    [provider]  tiZANidine (ZANAFLEX) 2 MG tablet Take 2 tablets (4 mg total) by mouth every 6 (six) hours as needed for muscle spasms. 08/24/17   Elodia FlorenceNida, Andrew, PA-C  traMADol (ULTRAM) 50 MG tablet Take 2 tablets (100 mg total) by mouth every 6 (six) hours as needed. 2 po 1 hour prior to PT 10/11/18   Margit HanksAlexander, Anne D, MD    Physical Exam:  Constitutional: Elderly  female who appears to be sick but in no acute distress Vitals:   22-Aug-2018 0546 22-Aug-2018 0549 22-Aug-2018 0552 22-Aug-2018 0615  BP:    (!) 90/45  Pulse:  69  69  Resp:  17  18  Temp:      TempSrc:      SpO2: 99% 100%  98%  Weight:   82.1 kg   Height:   5\' 7"  (1.702 m)    Eyes: PERRL, lids and conjunctivae normal ENMT: Mucous membranes are dry.  Posterior pharynx clear of any exudate or lesions. Neck: normal, supple, no masses, no thyromegaly Respiratory: Normal respiratory effort with what sounds like mild crackles appreciated in both lung fields.  Currently on 6 L of nasal cannula oxygen with O2 saturations 89% and improved to 91% on 7 L.  Blood mixed in sputum noted. Cardiovascular: Regular rate and rhythm, no murmurs / rubs / gallops. No extremity edema. 2+ pedal pulses. No carotid bruits.  Abdomen: no tenderness, no masses palpated. No hepatosplenomegaly. Bowel sounds positive.  Musculoskeletal: no clubbing / cyanosis. No joint deformity upper and lower extremities. Good ROM, no contractures. Normal muscle tone.  Skin: no rashes, lesions, ulcers. No induration Neurologic: CN 2-12 grossly intact. Sensation intact, DTR normal. Strength 5/5 in all 4.  Psychiatric: Normal judgment and insight. Alert and oriented x 3. Normal mood.     Labs on Admission: I have personally reviewed following labs and imaging studies  CBC: Recent Labs  Lab 22-Aug-2018 0629  WBC 3.8*  NEUTROABS 2.6  HGB 8.2*  HCT 27.0*  MCV 86.8  PLT 233   Basic Metabolic Panel: No results for input(s): NA, K, CL, CO2, GLUCOSE, BUN, CREATININE, CALCIUM, MG, PHOS in the last 168 hours. GFR: CrCl cannot be calculated (Patient's most recent lab result is older than the maximum 21 days allowed.). Liver Function Tests: No results for input(s): AST, ALT, ALKPHOS,  BILITOT, PROT, ALBUMIN in the last 168 hours. No results for input(s): LIPASE, AMYLASE in the last 168 hours. No results for input(s): AMMONIA in the last 168  hours. Coagulation Profile: No results for input(s): INR, PROTIME in the last 168 hours. Cardiac Enzymes: No results for input(s): CKTOTAL, CKMB, CKMBINDEX, TROPONINI in the last 168 hours. BNP (last 3 results) No results for input(s): PROBNP in the last 8760 hours. HbA1C: No results for input(s): HGBA1C in the last 72 hours. CBG: No results for input(s): GLUCAP in the last 168 hours. Lipid Profile: No results for input(s): CHOL, HDL, LDLCALC, TRIG, CHOLHDL, LDLDIRECT in the last 72 hours. Thyroid Function Tests: No results for input(s): TSH, T4TOTAL, FREET4, T3FREE, THYROIDAB in the last 72 hours. Anemia Panel: No results for input(s): VITAMINB12, FOLATE, FERRITIN, TIBC, IRON, RETICCTPCT in the last 72 hours. Urine analysis:    Component Value Date/Time   COLORURINE COLORLESS (A) 08/09/2017 1139   APPEARANCEUR CLEAR 08/09/2017 1139   LABSPEC 1.005 08/09/2017 1139   PHURINE 6.0 08/09/2017 1139   GLUCOSEU NEGATIVE 08/09/2017 1139   HGBUR SMALL (A) 08/09/2017 1139   BILIRUBINUR NEGATIVE 08/09/2017 1139   KETONESUR NEGATIVE 08/09/2017 1139   PROTEINUR NEGATIVE 08/09/2017 1139   UROBILINOGEN 0.2 07/02/2012 0633   NITRITE NEGATIVE 08/09/2017 1139   LEUKOCYTESUR NEGATIVE 08/09/2017 1139   Sepsis Labs: No results found for this or any previous visit (from the past 240 hour(s)).   Radiological Exams on Admission: Dg Chest Port 1 View  Result Date: 29-Oct-2018 CLINICAL DATA:  Cough for 3 days.  Positive COVID-19 EXAM: PORTABLE CHEST 1 VIEW COMPARISON:  12/20/2017 FINDINGS: Borderline heart size. Three valves have been replaced and there is left atrial clipping. Patchy bilateral lung opacity likely related to COVID-19 positivity. No effusion or pneumothorax. Lung volumes are low. IMPRESSION: Bilateral pneumonia in the setting of COVID-19. Electronically Signed   By: Marnee Spring M.D.   On: 10-29-2018 06:35    EKG: Independently reviewed.  Sinus rhythm at 72  bpm  Assessment/Plan Respiratory failure with hypoxia secondary to Sepsis with pneumonia due to COVID-19: Acute.  Patient presents with complaints of worsening cough with hemoptysis and fever. O2 saturations as low as 82% on room air requiring nonrebreather 15 L initially to maintain sats.  She was noted to be febrile up to 100.5 F with hypotension and WBC 3.8, but lactic acid reassuring at 0.9.  Diagnosed with COVID-19 2 days ago. Chest x-ray showing bilateral pneumonia. Several inflammatory markers elevated including pro-calcitonin 0.47, CRP 17.9, d-dimer 2.62, LDH 313, ferritin 350, and fibrinogen 743.  Patient is at high risk. Admit to progressive bed at [facility] -COVID -19 order set initiated -Airborne precautions -Continuous pulse oximetry with oxygen as needed -Follow-up blood and sputum cultures -Start Rocephin and azithromycin IV -Notified pharmacy of the need of Remdesivir -Solu-Medrol 60 mg IV every 8 hours -Combivent inhaler 4 times daily -Normal saline IV fluids -Decongestants as needed -Monitor inflammatory markers daily  Hypotension: Resolving.  Patient admits to decreased oral intake over the last few days.  Blood pressures initially low as 90/45, and improving with IV fluids.  Ordered 1 L of normal saline IV fluids in the ED. -Continue to monitor and bolus additional IV fluids as needed  Iron deficiency anemia: Acute on chronic.  Hemoglobin 8.2 g/dL with RDW 16.1.  Hemoglobin noted to be 7.8 on 6/11. Likely related with recent right femur fracture surgery on 6/7, but patient also reported to have hemoptysis and is on Eliquis.  Elevated RDW is consistent with iron deficiency and patient's last iron study on 6/10 was <10.  Patient currently on ferrous sulfate 325 mg daily -Continue ferrous sulfate, may benefit twice daily dosing or IV infusion -Continue to monitor blood counts  Open reduction internal fixation of right femur fracture: Patient recently had [facility] 6[facility] after having a fall sustaining distal right femur fracture. -Discontinued Voltaren due to COVID-19 -Continue tramadol as needed -Likely to need physical therapy and continued rehab  Paroxysmal atrial fibrillation on chronic anticoagulation: Patient currently rate controlled and in sinus rhythm at 72 bpm.  Taking Eliquis for anticoagulation. CHA2DS2-VASc score = equal to at least 4(CHF, CAD, sex, and age) -Continue Eliquis for now as hemoglobin appears relatively stable  COPD, without acute exacerbation: Patient without acute exacerbation.  Patient does not appear to be on any scheduled inhalers. -Continue loratadine, fluticasone, and Singulair  History of combined systolic and diastolic CHF, aortic and tricuspid valve replacement: She appears to be hypovolemic at this time.  Patient's last EF 55 to 60% with normally functioning aortic bioprosthetic valve and normally functioning tricuspid valve replacement with trace regurgitation in 08/2017. -Strict I&O's -Daily weight  Hyponatremia, hypochloremia: Sodium 130 and chloride on admission.  Suspect this is likely a hypovolemic hyponatremia. -Recheck in a.m.  Hypothyroidism -Continue levothyroxine  Hyperlipidemia -Continue atorvastatin  Deaf: Patient is deaf utilizing sign language and is able to read lips.   DVT prophylaxis: Eliquis Code Status: Full Family Communication: Updated family over the phone Disposition Plan: Will likely need to be discharged to a rehab facility for recent right femur fracture s/p repair Consults called:none Admission status: Inpatient  Clydie Braunondell A Theoren Palka MD [facility] Pager [phone]   If 7PM-7AM, please contact night-coverage [email or web address] Password [email or web address]  12/24/18, 7:15 AM

## 2018-10-25 NOTE — Progress Notes (Signed)
Pharmacy Brief Note  O:  ALT: 11 CXR: consistent with B/L pneumonia SpO2: 100 % on 15 L NRB  A/P:  Patient meets criteria for remdesevir therapy. Will initiate remdesivir 200 mg iv once followed by 100 mg iv daily x 4 days. Will f/u ALT.   Napoleon Form, [facility] 10/21/2018 1:21 PM

## 2018-10-25 NOTE — Progress Notes (Addendum)
Patient seen and examined-admitted earlier this morning by Dr. Tamala Julian.  Patient is stable on 5 L of oxygen-she is not short of breath and is very comfortable.  Unable to use iPad interpreter due to some technical issues, Patient normally communicates by reading lips-but this MD has N 95 mask on which prevents lip reading.  I briefly interacted with her by writing questions on board-she denies any prior history of TB and hepatitis B.  Since she appears relatively stable-we will continue with steroids and Remdesivir.  If she deteriorates-and becomes more hypoxic-I have asked the nursing staff to notify our service-she will then require Actemra.  I then spoke with the patient's daughter-Amy over the phone-limitations regarding interpreter use due to technical issues was discussed.  Patient normally communicates by reading lips-but this MD has N 95 mask on which prevents lip reading.  Explained that patient is at risk for further deterioration-and if hypoxia worsens-it may be beneficial to consider Actemra.  Rationale, side effects, benefits of Actemra discussed in detail-daughter consents to the use of Actemra if hypoxia worsens.  Have ordered BNP-if elevated-we will order some Lasix as well.  Do not think patient requires antibiotics-her procalcitonin is not elevated.

## 2018-10-25 NOTE — ED Notes (Signed)
Called care link to check on status of transport for pt to [facility]

## 2018-10-25 NOTE — ED Notes (Addendum)
ED TO INPATIENT HANDOFF REPORT  ED Nurse Name and Phone #: Thurmond Butts [place] Name/Age/Gender Allison [name] 68 y.o. female Room/Bed: 024C/024C  Code Status   Code Status: Prior  Home/SNF/Other Rehab Patient oriented to: self, place, time and situation Is this baseline? Yes   Triage Complete: Triage complete  Chief Complaint sick  Triage Note Pt coming from [facility] sent by facility after pt began coughing up "pink frothy foam" and having a cough for approx 3 days. Pt has hx of COPD and recent COVID+ test. Pt's BP 98/50. 82% on RA, placed on nonrebreather now at 99%. Pt is deaf but can read lips   Allergies Allergies  Allergen Reactions  . Clarithromycin Nausea Only and Other (See Comments)    Dizzy, blurred vision, achy stomach Dizzy, blurred vision, stomach ache. unknown   . Trazodone Other (See Comments)    Weakness in legs Numbness in arms Funny feeling Rapid heart beat Loses focus. unknown   . Trazodone And Nefazodone Other (See Comments)    Weakness in legs Numbness in arms Funny feeling Rapid heart beat Lose focus  . Naproxen Sodium Other (See Comments)    unknown  . Sulfa Antibiotics Other (See Comments)    Blister on large toe  . Sulfamethoxazole Other (See Comments)    unknown  . Aleve [Naproxen] Nausea And Vomiting and Other (See Comments)    Stomach ache  . Doxycycline Nausea Only and Other (See Comments)    Stomach ache Stomach ache. unknown   . Durabac [Apap-Salicyl-Phenyltolox-Caff] Nausea And Vomiting  . Estradiol Nausea Only and Other (See Comments)    Hurt stomach unknown  . Ibuprofen Nausea Only and Other (See Comments)    Stomach ahce unknown  . Macrobid [Nitrofurantoin] Other (See Comments)    BLOATING  . Methocarbamol Nausea Only and Other (See Comments)    Hurt stomach unknown  . Oxaprozin Nausea Only and Other (See Comments)    Hurt stomach unknown  . Topiramate Nausea And Vomiting and Other  (See Comments)    unknown  . Valdecoxib Nausea And Vomiting and Other (See Comments)    Stomach ache  unknown    Level of Care/Admitting Diagnosis ED Disposition    ED Disposition Condition [facility]: [place] [[id number]]  Level of Care: Progressive [102]  Covid Evaluation: Confirmed COVID Positive  Isolation Risk Level: High Risk/Airborne (Aerosolizing procedure, nebulizer, intubated/ventilation, CPAP/BiPAP)  Diagnosis: Pneumonia due to COVID-19 virus [[id number]]  Admitting Physician: Norval Morton [[id number]]  Attending Physician: Norval Morton [[id number]]  Estimated length of stay: 3 - 4 days  Certification:: I certify this patient will need inpatient services for at least 2 midnights  PT Class (Do Not Modify): Inpatient [101]  PT Acc Code (Do Not Modify): Private [1]       B Medical/Surgery History Past Medical History:  Diagnosis Date  . AC (acromioclavicular) joint bone spurs    spurs in shoulders with pain  . Anxiety   . Aortic insufficiency   . Arthritis    all over  . Chronic combined systolic and diastolic CHF (congestive heart failure) ([place])   . Constipation   . Coronary artery disease   . Deafness   . Dysrhythmia   . Fatty liver   . Fibromyalgia   . GERD (gastroesophageal reflux disease)   . Hyperlipidemia   . Hypertension   . Hypothyroidism   . IBS (irritable bowel syndrome)   .  Mitral regurgitation   . Neuropathy   . Non-ischemic cardiomyopathy (HCC)    a. Normal cors 01/2016.  . Osteoarthritis of glenohumeral joint, left    Left shoulder  . Osteopenia   . Persistent atrial fibrillation   . PFO (patent foramen ovale)    a. closure 03/2016.  . Pre-diabetes   . S/P aortic valve replacement with bioprosthetic valve 04/13/2016   23 mm [facility] Magna Ease bovine pericardial tissue valve  . S/P mitral valve repair 04/13/2016   26 mm Sorin Memo 3D ring annuloplasty  . 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 Tricuspid regurgitation    Past Surgical History:  Procedure Laterality Date  . ABDOMINAL HYSTERECTOMY     partial  . AORTIC VALVE REPLACEMENT N/A 04/13/2016   Procedure: AORTIC VALVE REPLACEMENT (AVR) implanted with Magna Ease size 23mm;  Surgeon: Purcell Nailslarence H Owen, MD;  Location: [facility];  Service: Open Heart Surgery;  Laterality: N/A;  . BREAST CYST EXCISION N/A 08/03/2017   pt has not had had breast surgery, can not remove this  . CARDIAC CATHETERIZATION N/A 02/18/2016   Procedure: Right/Left Heart Cath and Coronary Angiography;  Surgeon: Laurey Moralealton S McLean, MD;  [facility];  Service: Cardiovascular;  Laterality: N/A;  . CARDIOVERSION N/A 10/16/2014   Procedure: CARDIOVERSION;  Surgeon: [name], MD;  [facility];  Service: Cardiovascular;  Laterality: N/A;  . CARDIOVERSION N/A 12/04/2014   Procedure: CARDIOVERSION;  Surgeon: [name], MD;  Location: [facility] ENDOSCOPY;  Service: Cardiovascular;  Laterality: N/A;  . COLON SURGERY     spurs  . COLONOSCOPY  [id number]  . COLONOSCOPY WITH PROPOFOL  04/09/2012   Procedure: COLONOSCOPY WITH PROPOFOL;  Surgeon: Charolett BumpersMartin K Johnson, MD;  Location: [facility] ENDOSCOPY;  Service: Endoscopy;  Laterality: N/A;  . ESOPHAGOGASTRODUODENOSCOPY ENDOSCOPY     several times  . EXCISION OF SKIN TAG N/A 08/03/2017   Procedure: EXCISION OF BILATERAL CHEST WALL SKINS TAGS;  Surgeon: Berna Bueonnor, Chelsea A, MD;  Location: [facility];  Service: General;  Laterality: N/A;  . EYE SURGERY     catarcts  . MAZE N/A 04/13/2016   Procedure: MAZE;  Surgeon: Purcell Nailslarence H Owen, MD;  [facility];  Service: Open Heart Surgery;  Laterality: N/A;  . MITRAL VALVE REPAIR N/A 04/13/2016   Procedure: MITRAL VALVE REPAIR (MVR) using 26mm Sorin /MEMO 3D annuloplasty ring;  Surgeon: Purcell Nailslarence H Owen, MD;  [facility];  Service: Open Heart Surgery;  Laterality: N/A;  . PATENT FORAMEN OVALE CLOSURE N/A  04/13/2016   Procedure: PATENT FORAMEN OVALE (PFO) CLOSURE;  Surgeon: Purcell Nailslarence H Owen, MD;  [facility];  Service: Open Heart Surgery;  Laterality: N/A;  . TEE WITHOUT CARDIOVERSION N/A 02/18/2016   Procedure: TRANSESOPHAGEAL ECHOCARDIOGRAM (TEE);  Surgeon: Laurey Moralealton S McLean, MD;  [facility];  Service: Cardiovascular;  Laterality: N/A;  . TEE WITHOUT CARDIOVERSION N/A 04/13/2016   Procedure: TRANSESOPHAGEAL ECHOCARDIOGRAM (TEE);  Surgeon: Purcell Nailslarence H Owen, MD;  [facility];  Service: Open Heart Surgery;  Laterality: N/A;  . THYROID SURGERY  1991  . TONSILLECTOMY    . TOTAL KNEE ARTHROPLASTY Right 08/21/2017   Procedure: RIGHT TOTAL KNEE ARTHROPLASTY;  Surgeon: Marcene Corningalldorf, Peter, MD;  Location: [facility] OR;  Service: Orthopedics;  Laterality: Right;  . TOTAL SHOULDER ARTHROPLASTY Right 07/02/2012   Procedure: TOTAL SHOULDER ARTHROPLASTY;  Surgeon: Mable ParisJustin William Chandler, MD;  [facility];  Service: Orthopedics;  Laterality:  Right;  Right total shoulder arthroplasty  . TOTAL SHOULDER ARTHROPLASTY Left 11/16/2016  . TOTAL SHOULDER ARTHROPLASTY Left 11/16/2016   Procedure: TOTAL SHOULDER ARTHROPLASTY;  Surgeon: Jones Broomhandler, Justin, MD;  [facility];  Service: Orthopedics;  Laterality: Left;  Left total shoulder arthroplasty  . TRICUSPID VALVE REPLACEMENT N/A 04/13/2016   Procedure: TRICUSPID VALVE REPAIR using a T26 MC3 Edwards annuloplasty ring;  Surgeon: Purcell Nailslarence H Owen, MD;  Location[facility];  Service: Open Heart Surgery;  Laterality: N/A;  . TUBAL LIGATION    . vericose surgery Right leg  05-1998  . WISDOM TOOTH EXTRACTION       A IV Location/Drains/Wounds Patient Lines/Drains/Airways Status   Active Line/Drains/Airways    Name:   Placement date:   Placement time:   Site:   Days:   Peripheral IV 10/06/2018 Right;Anterior Forearm   10/28/2018    0630    Forearm   less than 1   Closed System Drain Right Shoulder Accordion (Hemovac) 10 Fr.   07/02/12    1010    Shoulder   2306    Incision (Closed) 11/16/16 Shoulder Left   11/16/16    1214     708   Incision (Closed) 08/03/17 Back Right   08/03/17    1018     448   Incision (Closed) 08/03/17 Abdomen Other (Comment)   08/03/17    1027     448   Incision (Closed) 08/03/17 Axilla Left   08/03/17    1027     448   Incision (Closed) 08/03/17 Breast Left   08/03/17    1027     448   Incision (Closed) 08/21/17 Knee Right   08/21/17    1409     430          Intake/Output Last 24 hours No intake or output data in the 24 hours ending 10/09/2018 0737  Labs/Imaging Results for orders placed or performed during the hospital encounter of 10/16/2018 (from the past 48 hour(s))  Lactic acid, plasma     Status: None   Collection Time: 10/07/2018  6:28 AM  Result Value Ref Range   Lactic Acid, Venous 0.9 0.5 - 1.9 mmol/L    Comment: Performed at [facility], [address] [place], [place] [address]  CBC WITH DIFFERENTIAL     Status: Abnormal   Collection Time: 10/09/2018  6:29 AM  Result Value Ref Range   WBC 3.8 (L) 4.0 - 10.5 K/uL   RBC 3.11 (L) 3.87 - 5.11 MIL/uL   Hemoglobin 8.2 (L) 12.0 - 15.0 g/dL   HCT 19.127.0 (L) 47.836.0 - 29.546.0 %   MCV 86.8 80.0 - 100.0 fL   MCH 26.4 26.0 - 34.0 pg   MCHC 30.4 30.0 - 36.0 g/dL   RDW 62.116.8 (H) 30.811.5 - 65.715.5 %   Platelets 233 150 - 400 K/uL   nRBC 0.0 0.0 - 0.2 %   Neutrophils Relative % 68 %   Neutro Abs 2.6 1.7 - 7.7 K/uL   Lymphocytes Relative 25 %   Lymphs Abs 1.0 0.7 - 4.0 K/uL   Monocytes Relative 7 %   Monocytes Absolute 0.3 0.1 - 1.0 K/uL   Eosinophils Relative 0 %   Eosinophils Absolute 0.0 0.0 - 0.5 K/uL   Basophils Relative 0 %   Basophils Absolute 0.0 0.0 - 0.1 K/uL   Immature Granulocytes 0 %   Abs Immature Granulocytes 0.01 0.00 - 0.07 K/uL    Comment: Performed  at [facility], [address], [place], [place] [address]  Comprehensive metabolic panel     Status: Abnormal   Collection Time: 11/20/18  6:29 AM  Result Value Ref Range   Sodium 130 (L) 135 - 145  mmol/L   Potassium 3.9 3.5 - 5.1 mmol/L   Chloride 92 (L) 98 - 111 mmol/L   CO2 27 22 - 32 mmol/L   Glucose, Bld 90 70 - 99 mg/dL   BUN 11 8 - 23 mg/dL   Creatinine, Ser 7.04 0.44 - 1.00 mg/dL   Calcium 7.9 (L) 8.9 - 10.3 mg/dL   Total Protein 5.6 (L) 6.5 - 8.1 g/dL   Albumin 2.6 (L) 3.5 - 5.0 g/dL   AST 25 15 - 41 U/L   ALT 11 0 - 44 U/L   Alkaline Phosphatase 94 38 - 126 U/L   Total Bilirubin 0.5 0.3 - 1.2 mg/dL   GFR calc non Af Amer >60 >60 mL/min   GFR calc Af Amer >60 >60 mL/min   Anion gap 11 5 - 15    Comment: Performed at [facility], [address] [place], [place] [address]  D-dimer, quantitative     Status: Abnormal   Collection Time: 11/20/18  6:29 AM  Result Value Ref Range   D-Dimer, Quant 2.62 (H) 0.00 - 0.50 ug/mL-FEU    Comment: (NOTE) At the manufacturer cut-off of 0.50 ug/mL FEU, this assay has been documented to exclude PE with a sensitivity and negative predictive value of 97 to 99%.  At this time, this assay has not been approved by the FDA to exclude DVT/VTE. Results should be correlated with clinical presentation. Performed at [facility], [address] [place], [place] [address]   Lactate dehydrogenase     Status: Abnormal   Collection Time: 11/20/2018  6:29 AM  Result Value Ref Range   LDH 313 (H) 98 - 192 U/L    Comment: Performed at [facility], [address], [place], [place] [address]  Fibrinogen     Status: Abnormal   Collection Time: 11-20-18  6:29 AM  Result Value Ref Range   Fibrinogen 743 (H) 210 - 475 mg/dL    Comment: REPEATED TO VERIFY Performed at [facility], [address], [place], [place] [address]   Triglycerides     Status: None   Collection Time: 2018/11/20  6:30 AM  Result Value Ref Range   Triglycerides 64 <150 mg/dL    Comment: Performed at [facility], [address] [place], [place] [address]   Dg Chest Port 1 View  Result Date: Nov 20, 2018 CLINICAL DATA:  Cough for 3 days.   Positive COVID-19 EXAM: PORTABLE CHEST 1 VIEW COMPARISON:  12/20/2017 FINDINGS: Borderline heart size. Three valves have been replaced and there is left atrial clipping. Patchy bilateral lung opacity likely related to COVID-19 positivity. No effusion or pneumothorax. Lung volumes are low. IMPRESSION: Bilateral pneumonia in the setting of COVID-19. Electronically Signed   By: Marnee Spring M.D.   On: Nov 20, 2018 06:35    Pending Labs Unresulted Labs (From admission, onward)    Start     Ordered   2018-11-20 0601  Lactic acid, plasma  Now then every 2 hours,   STAT     11-20-18 0600   20-Nov-2018 0601  Blood Culture (routine x 2)  BLOOD CULTURE X 2,   STAT     2018/11/20 0600   11/20/2018 0601  Procalcitonin  ONCE - STAT,   STAT     10/17/2018 0600   10/29/2018 0601  Ferritin  Once,   STAT     10/22/2018 0600   10/08/2018 0601  C-reactive protein  Once,   STAT     10/29/2018 0600          Vitals/Pain Today's Vitals   10/29/2018 0546 10/11/2018 0549 10/10/2018 0552 10/27/2018 0615  BP:    (!) 90/45  Pulse:  69  69  Resp:  17  18  Temp:      TempSrc:      SpO2: 99% 100%  98%  Weight:   82.1 kg   Height:   5\' 7"  (1.702 m)     Isolation Precautions Airborne and Contact precautions  Medications Medications  methylPREDNISolone sodium succinate (SOLU-MEDROL) 125 mg/2 mL injection 60 mg (has no administration in time range)  Ipratropium-Albuterol (COMBIVENT) respimat 1 puff (has no administration in time range)  acetaminophen (TYLENOL) suppository 650 mg (650 mg Rectal Given 10/21/2018 0703)  ondansetron (ZOFRAN) injection 4 mg (4 mg Intravenous Given 10/18/2018 0700)  sodium chloride 0.9 % bolus 1,000 mL (1,000 mLs Intravenous New Bag/Given 10/20/2018 0701)    Mobility non-ambulatory High fall risk   Focused Assessments    R Recommendations: See Admitting Provider Note  Report given to:  Windell Mouldinguth, RN  Additional Notes:

## 2018-10-25 NOTE — ED Provider Notes (Signed)
[facility] Provider Note   CSN: [id number] Arrival date & time: 10/15/2018  [id number]     History   Chief Complaint Chief Complaint  Patient presents with   Shortness of Breath   Level 5 caveat due to acuity of condition HPI Allison [name] is a 68 y.o. female.     The history is provided by the patient. The history is limited by the condition of the patient and a language barrier.  Shortness of Breath Severity:  Severe Onset quality:  Sudden Timing:  Constant Progression:  Worsening Chronicity:  New Relieved by:  Nothing Worsened by:  Nothing [facility] for cough, shortness of breath and hypoxia. Patient has multiple medical conditions including CAD, CHF, A. fib on anticoagulation, AVR with bioprosthetic valve Patient was found to be COVID-19+ 2 days ago. Over the past 24 hours she has had increasing cough, congestion, and pink frothy sputum. She is also requiring oxygenation.  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 Normal cors 01/2016.   Osteoarthritis of glenohumeral joint, left    Left shoulder   Osteopenia    Persistent atrial fibrillation    PFO (patent foramen  ovale)    a. closure 03/2016.   Pre-diabetes    S/P aortic valve replacement with bioprosthetic valve 04/13/2016   23 mm [facility] Magna Ease bovine pericardial tissue valve   S/P mitral valve repair 04/13/2016   26 mm Sorin Memo 3D ring annuloplasty   S/P tricuspid valve repair 04/13/2016   26 mm Edwards mc3 ring annuloplasty   Tricuspid regurgitation     Patient Active Problem List   Diagnosis Date Noted   Peri-prosthetic femur fracture at tip of prosthesis, subsequent encounter 10/13/2018   Left ankle pain 10/13/2018   Hyperlipidemia 10/13/2018   Anemia 01/02/2018   Hypertension    Benign essential HTN 08/25/2017   Coronary artery disease involving native coronary artery of native heart without angina pectoris 08/25/2017   GERD without esophagitis 08/25/2017   Other fracture of left femur, initial encounter for closed fracture (HCC) 08/25/2017   Primary osteoarthritis of right knee 08/21/2017   Sinus pain 02/28/2017   Spider veins of both lower extremities 01/16/2017   S/P shoulder replacement, left 11/16/2016   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   Other acute sinusitis 09/15/2016   Seasonal allergic rhinitis 09/15/2016   Encounter for therapeutic drug monitoring 05/12/2016   LBBB (left bundle branch block) 05/09/2016   S/P aortic valve replacement + mitral valve repair + tricupsid valve repair + maze procedure 04/13/2016   S/P aortic valve replacement with bioprosthetic valve 04/13/2016  S/P tricuspid valve repair 04/13/2016   S/P Maze operation for atrial fibrillation 04/13/2016   Tricuspid regurgitation    Mitral regurgitation    Deafness    Aortic insufficiency    Chronic combined systolic and diastolic CHF (congestive heart failure) ([place])    Non-ischemic cardiomyopathy ([place])    Anticoagulated 01/20/2016   Persistent atrial fibrillation (HCC)    Hypothyroidism 09/17/2014   Dyspnea 11/19/2013   Edema 10/01/2013    Dysphagia 08/28/2013   Arthritis of right shoulder region 07/03/2012    Past Surgical History:  Procedure Laterality Date   ABDOMINAL HYSTERECTOMY     partial   AORTIC VALVE REPLACEMENT N/A 04/13/2016   Procedure: AORTIC VALVE REPLACEMENT (AVR) implanted with Magna Ease size 11mm;  Surgeon: Rexene Alberts, MD;  Location: [place];  Service: Open Heart Surgery;  Laterality: N/A;   BREAST CYST EXCISION N/A 08/03/2017   pt has not had had breast surgery, can not remove this   CARDIAC CATHETERIZATION N/A 02/18/2016   Procedure: Right/Left Heart Cath and Coronary Angiography;  Surgeon: Larey Dresser, MD;  Location: [place] [facility];  Service: Cardiovascular;  Laterality: N/A;   CARDIOVERSION N/A 10/16/2014   Procedure: CARDIOVERSION;  Surgeon: Jettie Booze, MD;  Location: [facility] ENDOSCOPY;  Service: Cardiovascular;  Laterality: N/A;   CARDIOVERSION N/A 12/04/2014   Procedure: CARDIOVERSION;  Surgeon: Sanda Klein, MD;  Location: [place];  Service: Cardiovascular;  Laterality: N/A;   COLON SURGERY     spurs   COLONOSCOPY  [id number]   COLONOSCOPY WITH PROPOFOL  04/09/2012   Procedure: COLONOSCOPY WITH PROPOFOL;  Surgeon: Garlan Fair, MD;  Location: [facility] ENDOSCOPY;  Service: Endoscopy;  Laterality: N/A;   ESOPHAGOGASTRODUODENOSCOPY ENDOSCOPY     several times   EXCISION OF SKIN TAG N/A 08/03/2017   Procedure: EXCISION OF BILATERAL CHEST WALL SKINS TAGS;  Surgeon: Clovis Riley, MD;  Location: [place];  Service: General;  Laterality: N/A;   EYE SURGERY     catarcts   MAZE N/A 04/13/2016   Procedure: MAZE;  Surgeon: Rexene Alberts, MD;  Location: [place];  Service: Open Heart Surgery;  Laterality: N/A;   MITRAL VALVE REPAIR N/A 04/13/2016   Procedure: MITRAL VALVE REPAIR (MVR) using 63mm Sorin /MEMO 3D annuloplasty ring;  Surgeon: Rexene Alberts, MD;  Location: [place];  Service: Open Heart Surgery;  Laterality: N/A;   PATENT FORAMEN OVALE CLOSURE  N/A 04/13/2016   Procedure: PATENT FORAMEN OVALE (PFO) CLOSURE;  Surgeon: Rexene Alberts, MD;  Location: [place];  Service: Open Heart Surgery;  Laterality: N/A;   TEE WITHOUT CARDIOVERSION N/A 02/18/2016   Procedure: TRANSESOPHAGEAL ECHOCARDIOGRAM (TEE);  Surgeon: Larey Dresser, MD;  Location: [place];  Service: Cardiovascular;  Laterality: N/A;   TEE WITHOUT CARDIOVERSION N/A 04/13/2016   Procedure: TRANSESOPHAGEAL ECHOCARDIOGRAM (TEE);  Surgeon: Rexene Alberts, MD;  Location: [place];  Service: Open Heart Surgery;  Laterality: N/A;   THYROID SURGERY  1991   TONSILLECTOMY     TOTAL KNEE ARTHROPLASTY Right 08/21/2017   Procedure: RIGHT TOTAL KNEE ARTHROPLASTY;  Surgeon: Melrose Nakayama, MD;  Location: [place];  Service: Orthopedics;  Laterality: Right;   TOTAL SHOULDER ARTHROPLASTY Right 07/02/2012   Procedure: TOTAL SHOULDER ARTHROPLASTY;  Surgeon: Nita Sells, MD;  Location: [place];  Service: Orthopedics;  Laterality: Right;  Right total shoulder arthroplasty   TOTAL SHOULDER ARTHROPLASTY Left 11/16/2016   TOTAL SHOULDER ARTHROPLASTY Left 11/16/2016   Procedure: TOTAL SHOULDER ARTHROPLASTY;  Surgeon: Jones Broomhandler, Justin, MD;  [facility];  Service: Orthopedics;  Laterality: Left;  Left total shoulder arthroplasty   TRICUSPID VALVE REPLACEMENT N/A 04/13/2016   Procedure: TRICUSPID VALVE REPAIR using a T26 MC3 Edwards annuloplasty ring;  Surgeon: Purcell Nailslarence H Owen, MD;  Location: [facility] OR;  Service: Open Heart Surgery;  Laterality: N/A;   TUBAL LIGATION     vericose surgery Right leg  05-1998   WISDOM TOOTH EXTRACTION       OB History   No obstetric history on file.      Home Medications    Prior to Admission medications   Medication Sig Start Date End Date Taking? Authorizing Provider  acetaminophen (TYLENOL) 325 MG tablet Take 650 mg by mouth every 6 (six) hours as needed.    [provider]  apixaban (ELIQUIS) 5 MG TABS tablet Take 1 tablet (5 mg total)  by mouth 2 (two) times daily. 08/02/18   [name], [name], MD  atorvastatin (LIPITOR) 20 MG tablet Take 20 mg by mouth daily.    [provider]  bisacodyl (DULCOLAX) 5 MG EC tablet Give 1 tablet by mouth daily as needed for constipation    [provider]  Calcium 600-200 MG-UNIT tablet Take 1 tablet by mouth 2 (two) times daily.    [provider]  carvedilol (COREG) 12.5 MG tablet TAKE 1 TABLET BY MOUTH TWO  TIMES DAILY WITH MEALS 10/16/17   [name], [name], MD  cetirizine (ZYRTEC) 10 MG tablet Take 10 mg by mouth daily.    [provider]  diclofenac (VOLTAREN) 75 MG EC tablet Give 1 tablet by mouth daily as needed for pain    [provider]  diphenhydrAMINE (BENADRYL) 25 mg capsule Take 25 mg by mouth daily as needed for itching.     [provider]  famotidine (PEPCID) 40 MG tablet Give 1 tablet by mouth nightly for GERD    [provider]  ferrous sulfate 325 (65 FE) MG EC tablet Give 1 tablet by mouth daily    [provider]  fluticasone (VERAMYST) 27.5 MCG/SPRAY nasal spray Place 2 sprays into the nose daily.    [provider]  gabapentin (NEURONTIN) 600 MG tablet Take 600 mg by mouth 3 (three) times daily.     [provider]  levothyroxine (SYNTHROID, LEVOTHROID) 137 MCG tablet Take 137 mcg by mouth daily before breakfast.     [provider]  montelukast (SINGULAIR) 10 MG tablet Take 10 mg by mouth every evening.     [provider]  omeprazole (PRILOSEC) 40 MG capsule Take 40 mg by mouth daily.  07/12/13   [provider]  polyethylene glycol (MIRALAX / GLYCOLAX) 17 g packet Mix 17 gm in 4 oz of water and give by mouth daily as needed for constipation    [provider]  tiZANidine (ZANAFLEX) 2 MG tablet Take 2 tablets (4 mg total) by mouth every 6 (six) hours as needed for muscle spasms. 08/24/17   Elodia FlorenceNida, Andrew, PA-C  traMADol (ULTRAM) 50 MG tablet Take 2  tablets (100 mg total) by mouth every 6 (six) hours as needed. 2 po 1 hour prior to PT 10/11/18   Margit HanksAlexander, Anne D, MD    Family History Family History  Problem Relation Age of Onset   Lung cancer Father    Arrhythmia Father    Hypertension Father    Arrhythmia Mother    Hypertension Mother    Stroke Maternal Grandmother  Hypertension Maternal Grandmother    Heart attack Maternal Uncle        X2   Kidney disease Paternal Grandfather     Social History Social History   Tobacco Use   Smoking status: Never Smoker   Smokeless tobacco: Never Used  Substance Use Topics   Alcohol use: No   Drug use: No     Allergies   Clarithromycin, Trazodone, Trazodone and nefazodone, Naproxen sodium, Sulfa antibiotics, Sulfamethoxazole, Aleve [naproxen], Doxycycline, Durabac [apap-salicyl-phenyltolox-caff], Estradiol, Ibuprofen, Macrobid [nitrofurantoin], Methocarbamol, Oxaprozin, Topiramate, and Valdecoxib   Review of Systems Review of Systems  Unable to perform ROS: Acuity of condition  Respiratory: Positive for shortness of breath.      Physical Exam Updated Vital Signs BP (!) 90/51    Pulse 69    Temp (!) 100.5 F (38.1 C) (Oral)    Resp 17    Ht 1.702 m (5\' 7" )    Wt 82.1 kg    SpO2 100%    BMI 28.35 kg/m   Physical Exam CONSTITUTIONAL: Elderly, ill-appearing HEAD: Normocephalic/atraumatic EYES: EOMI  ENMT: oxygen mask in place NECK: supple no meningeal signs SPINE/BACK:entire spine nontender CV: S1/S2 noted, no murmurs/rubs/gallops noted LUNGS: Crackles bilaterally, tachypnea ABDOMEN: soft, nontender, no rebound or guarding, bowel sounds noted throughout abdomen GU:no cva tenderness NEURO: Pt is awake/alert/appropriate, moves all extremitiesx4.   EXTREMITIES: pulses normal/equal, full ROM, brace noted right leg SKIN: warm, color normal PSYCH: unable to assess  ED Treatments / Results  Labs (all labs ordered are listed, but only abnormal results are  displayed) Labs Reviewed  CULTURE, BLOOD (ROUTINE X 2)  CULTURE, BLOOD (ROUTINE X 2)  LACTIC ACID, PLASMA  LACTIC ACID, PLASMA  CBC WITH DIFFERENTIAL/PLATELET  COMPREHENSIVE METABOLIC PANEL  D-DIMER, QUANTITATIVE (NOT AT [facility])  PROCALCITONIN  LACTATE DEHYDROGENASE  FERRITIN  TRIGLYCERIDES  FIBRINOGEN  C-REACTIVE PROTEIN    EKG    EKG Interpretation  Date/Time:  Friday October 25 2018 06:06:01 EDT Ventricular Rate:  72 PR Interval:    QRS Duration: 91 QT Interval:  377 QTC Calculation: 413 R Axis:   64 Text Interpretation:  Sinus rhythm Short PR interval Interpretation limited secondary to artifact Confirmed by Zadie Rhine ([id number]) on 10/07/2018 6:13:04 AM       Radiology Dg Chest Port 1 View  Result Date: 09/30/2018 CLINICAL DATA:  Cough for 3 days.  Positive COVID-19 EXAM: PORTABLE CHEST 1 VIEW COMPARISON:  12/20/2017 FINDINGS: Borderline heart size. Three valves have been replaced and there is left atrial clipping. Patchy bilateral lung opacity likely related to COVID-19 positivity. No effusion or pneumothorax. Lung volumes are low. IMPRESSION: Bilateral pneumonia in the setting of COVID-19. Electronically Signed   By: Marnee Spring M.D.   On: 10/17/2018 06:35    Procedures .Critical Care Performed by: Zadie Rhine, MD Authorized by: Zadie Rhine, MD   Critical care provider statement:    Critical care time (minutes):  36   Critical care start time:  10/23/2018 6:17 AM   Critical care end time:  10/22/2018 6:53 AM   Critical care time was exclusive of:  Separately billable procedures and treating other patients   Critical care was necessary to treat or prevent imminent or life-threatening deterioration of the following conditions:  Respiratory failure, sepsis and shock   Critical care was time spent personally by me on the following activities:  Development of treatment plan with patient or surrogate, ordering and performing treatments and interventions,  ordering and review of laboratory studies, ordering and  review of radiographic studies, pulse oximetry, re-evaluation of patient's condition, examination of patient, evaluation of patient's response to treatment and review of old charts   I assumed direction of critical care for this patient from another provider in my specialty: no     Medications Ordered in ED Medications  acetaminophen (TYLENOL) suppository 650 mg (has no administration in time range)  ondansetron (ZOFRAN) injection 4 mg (has no administration in time range)  sodium chloride 0.9 % bolus 1,000 mL (has no administration in time range)     Initial Impression / Assessment and Plan / ED Course  I have reviewed the triage vital signs and the nursing notes.  Pertinent labs & imaging results that were available during my care of the patient were reviewed by me and considered in my medical decision making (see chart for details).        6:18 AM Patient presents from nursing facility known COVID Positive.  She is febrile, requiring oxygen.  She will need to be admitted. Attempted to call family without success 6:55 AM D/w daughter Amy at [phone] She was updated on critical condition of her mother  D/w patient via sign language interpreter Pt informed she had COVID pneumonia and she would be getting admitted and the critical nature of her illness Pt quickly desaturates when she takes off oxygen, and she is coughing up bloody sputum 6:57 AM D/w dr Madilyn Hook at shift change to f/u on labs and get patient admitted, likely will be admitted to [facility] Family will also need to be informed of disposition Final Clinical Impressions(s) / ED Diagnoses   Final diagnoses:  Pneumonia due to COVID-19 virus    ED Discharge Orders    None       Zadie Rhine, MD 10/14/2018 [phone]

## 2018-10-25 NOTE — ED Triage Notes (Addendum)
Pt coming from [facility] sent by facility after pt began coughing up "pink frothy foam" and having a cough for approx 3 days. Pt has hx of COPD and recent COVID+ test. Pt's BP 98/50. 82% on RA, placed on nonrebreather now at 99%. Pt is deaf but can read lips

## 2018-10-25 NOTE — ED Notes (Signed)
IV team at bedside 

## 2018-10-25 NOTE — Progress Notes (Addendum)
   10/24/2018 2340  Vitals  Temp 97.7 F (36.5 C)  Temp Source Oral  BP (!) 76/45  MAP (mmHg) (!) 56  BP Location Left Arm  BP Method Automatic  Patient Position (if appropriate) Lying  Pulse Rate 61  ECG Heart Rate 62  Resp 17  Oxygen Therapy  SpO2 91 %  O2 Device Nasal Cannula  O2 Flow Rate (L/min) 8 L/min  MEWS Score  MEWS RR 0  MEWS Pulse 0  MEWS Systolic 2  MEWS LOC 0  MEWS Temp 0  MEWS Score 2  MEWS Score Color Yellow  Dr Josephine Cables notified. Awaiting new orders.  2352 Verbal order to give 250 cc bolus and recheck BP. Will continue to monitor patient.

## 2018-10-26 ENCOUNTER — Inpatient Hospital Stay (HOSPITAL_COMMUNITY): Payer: Medicare Other

## 2018-10-26 LAB — CBC WITH DIFFERENTIAL/PLATELET
Abs Immature Granulocytes: 0.01 10*3/uL (ref 0.00–0.07)
Basophils Absolute: 0 10*3/uL (ref 0.0–0.1)
Basophils Relative: 0 %
Eosinophils Absolute: 0 10*3/uL (ref 0.0–0.5)
Eosinophils Relative: 0 %
HCT: 29.7 % — ABNORMAL LOW (ref 36.0–46.0)
Hemoglobin: 8.7 g/dL — ABNORMAL LOW (ref 12.0–15.0)
Immature Granulocytes: 0 %
Lymphocytes Relative: 25 %
Lymphs Abs: 0.7 10*3/uL (ref 0.7–4.0)
MCH: 25.8 pg — ABNORMAL LOW (ref 26.0–34.0)
MCHC: 29.3 g/dL — ABNORMAL LOW (ref 30.0–36.0)
MCV: 88.1 fL (ref 80.0–100.0)
Monocytes Absolute: 0.2 10*3/uL (ref 0.1–1.0)
Monocytes Relative: 6 %
Neutro Abs: 1.9 10*3/uL (ref 1.7–7.7)
Neutrophils Relative %: 69 %
Platelets: 264 10*3/uL (ref 150–400)
RBC: 3.37 MIL/uL — ABNORMAL LOW (ref 3.87–5.11)
RDW: 16.7 % — ABNORMAL HIGH (ref 11.5–15.5)
WBC: 2.7 10*3/uL — ABNORMAL LOW (ref 4.0–10.5)
nRBC: 0 % (ref 0.0–0.2)

## 2018-10-26 LAB — C-REACTIVE PROTEIN: CRP: 17.2 mg/dL — ABNORMAL HIGH (ref <1.0)

## 2018-10-26 LAB — COMPREHENSIVE METABOLIC PANEL
ALT: 14 U/L (ref 0–44)
AST: 30 U/L (ref 15–41)
Albumin: 2.7 g/dL — ABNORMAL LOW (ref 3.5–5.0)
Alkaline Phosphatase: 89 U/L (ref 38–126)
Anion gap: 20 — ABNORMAL HIGH (ref 5–15)
BUN: 15 mg/dL (ref 8–23)
CO2: 27 mmol/L (ref 22–32)
Calcium: 8.7 mg/dL — ABNORMAL LOW (ref 8.9–10.3)
Chloride: 95 mmol/L — ABNORMAL LOW (ref 98–111)
Creatinine, Ser: 0.72 mg/dL (ref 0.44–1.00)
GFR calc Af Amer: >60 mL/min (ref >60)
GFR calc non Af Amer: >60 mL/min (ref >60)
Glucose, Bld: 169 mg/dL — ABNORMAL HIGH (ref 70–99)
Potassium: 3.7 mmol/L (ref 3.5–5.1)
Sodium: 142 mmol/L (ref 135–145)
Total Bilirubin: 0.3 mg/dL (ref 0.3–1.2)
Total Protein: 6.2 g/dL — ABNORMAL LOW (ref 6.5–8.1)

## 2018-10-26 LAB — MAGNESIUM: Magnesium: 2 mg/dL (ref 1.7–2.4)

## 2018-10-26 LAB — FERRITIN: Ferritin: 421 ng/mL — ABNORMAL HIGH (ref 11–307)

## 2018-10-26 LAB — D-DIMER, QUANTITATIVE: D-Dimer, Quant: 2.41 ug/mL-FEU — ABNORMAL HIGH (ref 0.00–0.50)

## 2018-10-26 LAB — BRAIN NATRIURETIC PEPTIDE: B Natriuretic Peptide: 211.7 pg/mL — ABNORMAL HIGH (ref 0.0–100.0)

## 2018-10-26 LAB — ABO/RH: ABO/RH(D): O POS

## 2018-10-26 MED ORDER — APIXABAN 5 MG PO TABS: 5.0000 mg | ORAL_TABLET | Freq: Two times a day (BID) | ORAL | Status: DC

## 2018-10-26 MED ORDER — BENZONATATE 100 MG PO CAPS
200.0000 mg | ORAL_CAPSULE | Freq: Three times a day (TID) | ORAL | Status: DC
  Administered 2018-10-26 – 2018-11-05 (×31): 200 mg via ORAL
  Filled 2018-10-26 (×36): qty 2

## 2018-10-26 MED ORDER — SODIUM CHLORIDE 0.9% IV SOLUTION
Freq: Once | INTRAVENOUS | Status: AC
  Administered 2018-10-27: via INTRAVENOUS

## 2018-10-26 MED ORDER — SIMETHICONE 80 MG PO CHEW
80.0000 mg | CHEWABLE_TABLET | Freq: Four times a day (QID) | ORAL | Status: DC | PRN
  Administered 2018-10-26 – 2018-10-31 (×6): 80 mg via ORAL
  Filled 2018-10-26 (×9): qty 1

## 2018-10-26 NOTE — Progress Notes (Addendum)
PROGRESS NOTE                                                                                                                                                                                                             Patient Demographics:    Allison [name], is a 68 y.o. female, DOB - 08/03/1950, NFA:[id number]  Outpatient Primary MD for the patient is [name], [name], MD    LOS - 1  Chief Complaint  Patient presents with  . Shortness of Breath       Brief Narrative: Patient is a 68 y.o. 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  She presented to the hospital on 6/26 with approximately 5-6-day history of cough along with minimal hemoptysis, subjective fever, malaise and worsening shortness of breath.  She was subsequently diagnosed with COVID-19-and found to have acute hypoxic respiratory failure secondary to COVID-19 pneumonia.  She was then [facility] hospitalist [facility].   Subjective:    Allison [name] today remains the same-she continues to have some minimal hemoptysis with coughing spells.  She is on 10 L of oxygen-but is not tachypneic and appears comfortable.  Note-rounded with video interpreter via bedside iPad.  Even with video interpreter services-patient was dozing off at times-and at times was looking at me instead of the interpreter.  Very difficult situation   Assessment  & Plan :   Acute hypoxemic respiratory failure secondary to COVID-19 pneumonia: Oxygen requirements have increased overnight-yesterday she was on 5 L-now she is on 10 L.  She was started  on Remdesivir and Solu-Medrol when [facility], and when her oxygen requirements went up to 8 L-she was given Actemra yesterday evening by night coverage.  Plans are to continue Solu-Medrol and Remdesivir today-we will watch her closely-nursing staff to attempt to titrate down oxygen as tolerated.  If her oxygen requirements remain elevated or worsens-she will likely need convalescent plasma (discussed in detail regarding rationale/risks/benefits-have left consent paperwork at bedside).  We will ask nursing staff to see if patient can  tolerate lying prone for 3 to 4 hours at a time of for a total of 16 hours a day.  There is no evidence of volume overload-do not think she requires a diuretic today.  Inflammatory markers remain significantly elevated-we will follow closely.  She is at risk for further deterioration-she remains a full code.    COVID-19 Labs:  Recent Labs    06/14/18 0629 10/26/18 0300  DDIMER 2.62* 2.41*  FERRITIN 350* 421*  LDH 313*  --   CRP 17.9* 17.2*    COVID-19 Medications: 6/25>> Solu-Medrol 6/25>> Remdesivir 6/25>> Actemra  The treatment plan and use of Actemra-its known side effects were discussed with patient & family, they were clearly explained that there is no definitive treatment for COVID-19 infection (apart from Remdesivir), use of the above noted medications are based on published clinical articles/anecdotal data which are not peer-reviewed or randomized control trials.  Complete risks and long-term side effects are unknown, however in the best clinical judgment they seem to have of some clinical benefit which at this time outweighs medical risks given tenuous clinical state of the patient.  Patient/family agree with the treatment plan and want to receive the given medications.  Hemoptysis: Suspect secondary to extensive pneumonia-hemoglobin has improved compared to yesterday-respirations are stable-we will watch closely.  If hemoptysis  worsens-may need pulmonary input or further imaging with a CT chest.  Will start scheduled Tessalon Perles-and continue with as needed antitussives  PAF: Remains in sinus rhythm-given hemoptysis-we will hold Eliquis for the rest of the day.  Hold Coreg for now-as patient was briefly hypotensive last night.  Chronic combined systolic and diastolic heart failure: Volume status appears stable-do not think patient needs diuretics at this time.  Hypertension: Blood pressure stable this morning-was transiently hypotensive overnight-continue to hold all antihypertensives  S/p bioprosthetic aortic valve replacement, s/p mitral valve replacement, s/p tricuspid valve repair-PFO closure in 2017  Bronchial asthma: Appears stable-continue bronchodilators and montelukast  Hypothyroidism: Continue with Synthroid  GERD: Continue Pepcid  Recent Right femure fracture: s/p ORIF on 6/7 at [facility]-outpatient ortho note as of 6/22 reviewed-NWB for 4 more weeks-will order PT/OT consult   Deafness: Bedside video/iPad interpreter using sign language utilized    ABG:    Component Value Date/Time   PHART 7.346 (L) 04/14/2016 0531   PCO2ART 35.3 04/14/2016 0531   PO2ART 79.0 (L) 04/14/2016 0531   HCO3 19.3 (L) 04/14/2016 0531   TCO2 21 04/14/2016 1641   ACIDBASEDEF 6.0 (H) 04/14/2016 0531   O2SAT 95.0 04/14/2016 0531    Condition - Extremely Guarded  Family Communication  :  Daughter [name] updated over the phone  Code Status :  Full Code  Diet :  Diet Order            Diet Heart Room service appropriate? Yes; Fluid consistency: Thin  Diet effective now               Disposition Plan  :  Remain inpatient  Consults  : None  Procedures  :  None  GI prophylaxis: H2 Blocker  DVT Prophylaxis  : Eliquis  Lab Results  Component Value Date   PLT 264 10/26/2018    Inpatient Medications  Scheduled Meds: . [START ON 10/27/2018] apixaban  5 mg Oral BID  . atorvastatin  20 mg Oral q1800  .  benzonatate  200 mg Oral TID  . calcium-vitamin D  1 tablet Oral BID  . famotidine  20 mg Oral BID  . ferrous sulfate  325 mg Oral Q breakfast  . fluticasone  2 spray Each Nare Daily  . gabapentin  600 mg Oral TID  . Ipratropium-Albuterol  1 puff Inhalation QID  . levothyroxine  137 mcg Oral QAC breakfast  . loratadine  10 mg Oral Daily  . methylPREDNISolone (SOLU-MEDROL) injection  40 mg Intravenous Q12H  . montelukast  10 mg Oral QPM  . polyethylene glycol  17 g Oral Daily   Continuous Infusions: . sodium chloride 75 mL/hr at 10/26/18 0550  . remdesivir 100 mg in NS 250 mL     PRN Meds:.acetaminophen, bisacodyl, chlorpheniramine-HYDROcodone, diphenhydrAMINE, guaiFENesin-dextromethorphan, HYDROcodone-acetaminophen, LORazepam, ondansetron **OR** ondansetron (ZOFRAN) IV, tiZANidine  Antibiotics  :    Anti-infectives (From admission, onward)   Start     Dose/Rate Route Frequency Ordered Stop   10/26/18 1600  remdesivir 100 mg in sodium chloride 0.9 % 250 mL IVPB     100 mg 500 mL/hr over 30 Minutes Intravenous Every 24 hours 10/24/2018 1440 10/30/18 1559   10/23/2018 1600  remdesivir 200 mg in sodium chloride 0.9 % 250 mL IVPB     200 mg 500 mL/hr over 30 Minutes Intravenous Once 10/04/2018 1440 10/08/2018 1757   10/16/2018 0830  cefTRIAXone (ROCEPHIN) 2 g in sodium chloride 0.9 % 100 mL IVPB  Status:  Discontinued     2 g 200 mL/hr over 30 Minutes Intravenous Every 24 hours 10/24/2018 0807 10/02/2018 1509   10/12/2018 0830  azithromycin (ZITHROMAX) 500 mg in sodium chloride 0.9 % 250 mL IVPB  Status:  Discontinued     500 mg 250 mL/hr over 60 Minutes Intravenous Every 24 hours 10/27/2018 0807 10/08/2018 1509       Time Spent in minutes  35    Jeoffrey Massed M.D on 10/26/2018 at 9:56 AM  To page go to [email or web address] - use universal password  [facility] -  Office  [phone]  See all Orders from today for further details   Admit date - 10/22/2018    1    Objective:   Vitals:    10/26/18 0300 10/26/18 0500 10/26/18 0557 10/26/18 0600  BP: (!) 95/49  (!) 113/54 (!) 120/48  Pulse: 68  71 85  Resp: 17  (!) 22 (!) 29  Temp:   97.6 F (36.4 C)   TempSrc:   Oral   SpO2: 91%  92% (!) 87%  Weight:  87.9 kg    Height:        Wt Readings from Last 3 Encounters:  10/26/18 87.9 kg  03/19/18 74.8 kg  01/02/18 78.5 kg     Intake/Output Summary (Last 24 hours) at 10/26/2018 0956 Last data filed at 10/26/2018 0554 Gross per 24 hour  Intake 1755.14 ml  Output 2500 ml  Net -744.86 ml     Physical Exam Gen Exam:Alert awake-not in any distress HEENT:atraumatic, normocephalic Chest: Bilateral rales-very fine CVS:S1S2 regular Abdomen:soft non tender, non distended Extremities:no edema Neurology: Nonfocal Skin: no rash   Data Review:    CBC Recent Labs  Lab 09/30/2018 0629 10/26/18 0300  WBC 3.8* 2.7*  HGB 8.2* 8.7*  HCT 27.0* 29.7*  PLT 233 264  MCV 86.8 88.1  MCH 26.4 25.8*  MCHC 30.4 29.3*  RDW 16.8* 16.7*  LYMPHSABS 1.0 0.7  MONOABS 0.3 0.2  EOSABS 0.0 0.0  BASOSABS 0.0 0.0    Chemistries  Recent Labs  Lab 10/16/2018 0629 10/26/18 0300  NA 130* 142  K 3.9 3.7  CL 92* 95*  CO2 27 27  GLUCOSE 90 169*  BUN 11 15  CREATININE 0.95 0.72  CALCIUM 7.9* 8.7*  MG  --  2.0  AST 25 30  ALT 11 14  ALKPHOS 94 89  BILITOT 0.5 0.3   ------------------------------------------------------------------------------------------------------------------ Recent Labs    10/28/2018 0630  TRIG 64    Lab Results  Component Value Date   HGBA1C 5.9 (H) 04/10/2016   ------------------------------------------------------------------------------------------------------------------ No results for input(s): TSH, T4TOTAL, T3FREE, THYROIDAB in the last 72 hours.  Invalid input(s): FREET3 ------------------------------------------------------------------------------------------------------------------ Recent Labs    10/10/2018 0629 10/26/18 0300  FERRITIN  350* 421*    Coagulation profile No results for input(s): INR, PROTIME in the last 168 hours.  Recent Labs    09/30/2018 0629 10/26/18 0300  DDIMER 2.62* 2.41*    Cardiac Enzymes No results for input(s): CKMB, TROPONINI, MYOGLOBIN in the last 168 hours.  Invalid input(s): CK ------------------------------------------------------------------------------------------------------------------    Component Value Date/Time   BNP 211.7 (H) 10/26/2018 0300   BNP 310.6 (H) 01/20/2016 1435    Micro Results Recent Results (from the past 240 hour(s))  Blood Culture (routine x 2)     Status: None (Preliminary result)   Collection Time: 10/05/2018  6:29 AM   Specimen: BLOOD  Result Value Ref Range Status   Specimen Description BLOOD LEFT ARM  Final   Special Requests   Final    BOTTLES DRAWN AEROBIC AND ANAEROBIC Blood Culture results may not be optimal due to an excessive volume of blood received in culture bottles   Culture   Final    NO GROWTH 1 DAY Performed at [facility], [place] [address] [place], [place] [address]    Report Status PENDING  Incomplete  Blood Culture (routine x 2)     Status: None (Preliminary result)   Collection Time: 10/05/2018  7:29 AM   Specimen: BLOOD  Result Value Ref Range Status   Specimen Description BLOOD SITE NOT SPECIFIED  Final   Special Requests   Final    BOTTLES DRAWN AEROBIC AND ANAEROBIC Blood Culture adequate volume   Culture   Final    NO GROWTH 1 DAY Performed at [facility], [place] [address] [place], [place] [address]    Report Status PENDING  Incomplete    Radiology Reports Dg Chest Port 1 View  Result Date: 10/29/2018 CLINICAL DATA:  Cough for 3 days.  Positive COVID-19 EXAM: PORTABLE CHEST 1 VIEW COMPARISON:  12/20/2017 FINDINGS: Borderline heart size. Three valves have been replaced and there is left atrial clipping. Patchy bilateral lung opacity likely related to COVID-19 positivity. No effusion or pneumothorax. Lung  volumes are low. IMPRESSION: Bilateral pneumonia in the setting of COVID-19. Electronically Signed   By: Monte Fantasia M.D.   On: 09/30/2018 06:35   Dg Chest Port 1v Same Day  Result Date: 10/26/2018 CLINICAL DATA:  Shortness of breath EXAM: PORTABLE CHEST 1 VIEW COMPARISON:  Yesterday FINDINGS: Improved lung volumes and mild decrease in opacity. There is still extensive patchy bilateral airspace disease in the setting of COVID-19. Densest opacity in the right upper lobe. Postoperative heart with replaced valves. No effusion or pneumothorax. IMPRESSION: Improved lung volumes and aeration, but still extensive pneumonia asymmetric to the right upper lobe. Electronically Signed   By: Monte Fantasia M.D.   On: 10/26/2018 08:45

## 2018-10-26 NOTE — Progress Notes (Signed)
Patient facetimed with her father.

## 2018-10-26 NOTE — Progress Notes (Signed)
Danae Chen, RN charge nurse faxed packing slip to [employer]. This RN paged [facility] on call to ask for Gas X per patient's request for c/o gas pain.

## 2018-10-26 NOTE — Progress Notes (Signed)
Patient's daughter called and was updated on patient status by this RN. Questions answered.  Patient communicating with her daughter over facetime using sign language at this time.

## 2018-10-26 NOTE — Progress Notes (Signed)
Patient placed on HFNC after increasing [place] to 8L with little improvement in O2 sats. Patient currently on HFNC @ 15L. Maintaining sats in low 90's desats to mid 80's with coughing spell. PRN robitussin given. Patient in no acute distress. Resting in bed.

## 2018-10-26 NOTE — Research (Signed)
I, Oren Binet, MD, consented Subject Allison [name] (female, Date of Birth Mar 10, 1951, 68 y.o.) and with diagnosis of COVID-19, in the [facility] Expanded Access Program (EAP) Research Protocol for [employer] against COVID-19.  The consent took place under following circumstances.   Subject Capacity assessed by this investigator as:  Presence of adequate emotional and mental capacity to consent with normal ability to read and write.  Consent took place in the following setting(s):  In-room, face to face   The following were present for the consent process:  Investigator   A copy of the cover letter and signed consent document was provided to subject/LAR.  The original signed consent document has been placed in the subject's physical chart and will be scanned into the electronic medical record upon discharge.  Statement of acknowledgement that the following was discussed with the subject/LAR:    1) Discussed the purpose of the research and procedures  2) Discussed risks and benefits and uncertainties of study participation 3) Discussed subject's responsibilities  4) Discussed the measures in place to maintain subject's confidentiality while a participant on the trial  5) Discussed alternatives to study participation.   6) Discussed study participation is voluntary and that the subject's care would not be jeopardized if they declined participation in the study.   7) Discussed freedom to withdraw at any time.   8) All subject/LAR questions were answered to their satisfaction.   9) In case of emergency consent, investigator agreed to discuss with subject/LAR at earliest available opportunity when the subject stabilizes and/or LAR can be located.     Final Investigator [address] [place], [place] [phone]   Date: 10/26/2018 and 6:28 PM

## 2018-10-26 NOTE — Progress Notes (Signed)
[facility] and spoke with Merrilyn Puma regarding order for plasma.  Yodice stated she would order the plasma and it comes from [facility] and they are short on the plasma so she will keep [place] updated on status. Will fax packing slip to blood bank.

## 2018-10-26 NOTE — Progress Notes (Signed)
   10/26/18 0000  Vitals  BP (!) 82/41  MAP (mmHg) (!) 54  Pulse Rate (!) 58  ECG Heart Rate (!) 58  Resp 19  Oxygen Therapy  SpO2 91 %  O2 Device Nasal Cannula  O2 Flow Rate (L/min) 8 L/min  MEWS Score  MEWS RR 0  MEWS Pulse 0  MEWS Systolic 1  MEWS LOC 0  MEWS Temp 0  MEWS Score 1  MEWS Score Color Green  Bolus Complete.

## 2018-10-26 NOTE — Progress Notes (Signed)
Seen and examined this evening-bedside Ipad video interpreter used Now on 15 L HFNC-but appears comfortable Due to worsening hypoxemia-will go ahead and transfuse convalescent plasma-she has signed the consent form that I left earlier this morning Updated Daughter Allison [name] over the phone-regarding above.

## 2018-10-27 ENCOUNTER — Inpatient Hospital Stay: Payer: Self-pay

## 2018-10-27 DIAGNOSIS — Z7901 Long term (current) use of anticoagulants: Secondary | ICD-10-CM

## 2018-10-27 DIAGNOSIS — I480 Paroxysmal atrial fibrillation: Secondary | ICD-10-CM

## 2018-10-27 DIAGNOSIS — J9601 Acute respiratory failure with hypoxia: Secondary | ICD-10-CM

## 2018-10-27 DIAGNOSIS — J1289 Other viral pneumonia: Secondary | ICD-10-CM

## 2018-10-27 DIAGNOSIS — U071 COVID-19: Principal | ICD-10-CM

## 2018-10-27 LAB — CBC WITH DIFFERENTIAL/PLATELET
Abs Immature Granulocytes: 0.03 10*3/uL (ref 0.00–0.07)
Basophils Absolute: 0 10*3/uL (ref 0.0–0.1)
Basophils Relative: 0 %
Eosinophils Absolute: 0 10*3/uL (ref 0.0–0.5)
Eosinophils Relative: 0 %
HCT: 27 % — ABNORMAL LOW (ref 36.0–46.0)
Hemoglobin: 8 g/dL — ABNORMAL LOW (ref 12.0–15.0)
Immature Granulocytes: 1 %
Lymphocytes Relative: 14 %
Lymphs Abs: 0.6 10*3/uL — ABNORMAL LOW (ref 0.7–4.0)
MCH: 26 pg (ref 26.0–34.0)
MCHC: 29.6 g/dL — ABNORMAL LOW (ref 30.0–36.0)
MCV: 87.7 fL (ref 80.0–100.0)
Monocytes Absolute: 0.3 10*3/uL (ref 0.1–1.0)
Monocytes Relative: 7 %
Neutro Abs: 3.5 10*3/uL (ref 1.7–7.7)
Neutrophils Relative %: 78 %
Platelets: 286 10*3/uL (ref 150–400)
RBC: 3.08 MIL/uL — ABNORMAL LOW (ref 3.87–5.11)
RDW: 16.5 % — ABNORMAL HIGH (ref 11.5–15.5)
WBC: 4.5 10*3/uL (ref 4.0–10.5)
nRBC: 0 % (ref 0.0–0.2)

## 2018-10-27 LAB — PREPARE FRESH FROZEN PLASMA: Unit division: 0

## 2018-10-27 LAB — COMPREHENSIVE METABOLIC PANEL
ALT: 15 U/L (ref 0–44)
AST: 33 U/L (ref 15–41)
Albumin: 2.4 g/dL — ABNORMAL LOW (ref 3.5–5.0)
Alkaline Phosphatase: 78 U/L (ref 38–126)
Anion gap: 7 (ref 5–15)
BUN: 18 mg/dL (ref 8–23)
CO2: 28 mmol/L (ref 22–32)
Calcium: 8.1 mg/dL — ABNORMAL LOW (ref 8.9–10.3)
Chloride: 101 mmol/L (ref 98–111)
Creatinine, Ser: 0.75 mg/dL (ref 0.44–1.00)
GFR calc Af Amer: >60 mL/min (ref >60)
GFR calc non Af Amer: >60 mL/min (ref >60)
Glucose, Bld: 144 mg/dL — ABNORMAL HIGH (ref 70–99)
Potassium: 4.1 mmol/L (ref 3.5–5.1)
Sodium: 136 mmol/L (ref 135–145)
Total Bilirubin: 0.2 mg/dL — ABNORMAL LOW (ref 0.3–1.2)
Total Protein: 5.3 g/dL — ABNORMAL LOW (ref 6.5–8.1)

## 2018-10-27 LAB — POCT I-STAT 7, (LYTES, BLD GAS, ICA,H+H)
Acid-Base Excess: 3 mmol/L — ABNORMAL HIGH (ref 0.0–2.0)
Bicarbonate: 27.6 mmol/L (ref 20.0–28.0)
Calcium, Ion: 1.16 mmol/L (ref 1.15–1.40)
HCT: 28 % — ABNORMAL LOW (ref 36.0–46.0)
Hemoglobin: 9.5 g/dL — ABNORMAL LOW (ref 12.0–15.0)
O2 Saturation: 90 %
Patient temperature: 98.7
Potassium: 4 mmol/L (ref 3.5–5.1)
Sodium: 134 mmol/L — ABNORMAL LOW (ref 135–145)
TCO2: 29 mmol/L (ref 22–32)
pCO2 arterial: 39.2 mmHg (ref 32.0–48.0)
pH, Arterial: 7.456 — ABNORMAL HIGH (ref 7.350–7.450)
pO2, Arterial: 55 mmHg — ABNORMAL LOW (ref 83.0–108.0)

## 2018-10-27 LAB — D-DIMER, QUANTITATIVE: D-Dimer, Quant: 1.7 ug/mL-FEU — ABNORMAL HIGH (ref 0.00–0.50)

## 2018-10-27 LAB — APTT: aPTT: 44 seconds — ABNORMAL HIGH (ref 24–36)

## 2018-10-27 LAB — C-REACTIVE PROTEIN: CRP: 8.1 mg/dL — ABNORMAL HIGH (ref <1.0)

## 2018-10-27 LAB — BPAM FFP
Blood Product Expiration Date: 202006282307
ISSUE DATE / TIME: 202006272329
Unit Type and Rh: 5100

## 2018-10-27 LAB — HEPARIN LEVEL (UNFRACTIONATED): Heparin Unfractionated: 1.28 IU/mL — ABNORMAL HIGH (ref 0.30–0.70)

## 2018-10-27 LAB — MAGNESIUM: Magnesium: 2 mg/dL (ref 1.7–2.4)

## 2018-10-27 LAB — FERRITIN: Ferritin: 511 ng/mL — ABNORMAL HIGH (ref 11–307)

## 2018-10-27 MED ORDER — HEPARIN (PORCINE) 25000 UT/250ML-% IV SOLN
1050.0000 [IU]/h | INTRAVENOUS | Status: DC
  Administered 2018-10-27: 1050 [IU]/h via INTRAVENOUS
  Filled 2018-10-27: qty 250

## 2018-10-27 MED ORDER — MENTHOL 3 MG MT LOZG
1.0000 | LOZENGE | OROMUCOSAL | Status: DC | PRN
  Administered 2018-10-27 (×3): 3 mg via ORAL
  Filled 2018-10-27: qty 9

## 2018-10-27 MED ORDER — SODIUM CHLORIDE 0.9% FLUSH
10.0000 mL | Freq: Two times a day (BID) | INTRAVENOUS | Status: DC
  Administered 2018-10-27 – 2018-10-28 (×4): 10 mL

## 2018-10-27 MED ORDER — LORAZEPAM 2 MG/ML IJ SOLN
0.2500 mg | Freq: Once | INTRAMUSCULAR | Status: AC
  Administered 2018-10-27: 0.25 mg via INTRAVENOUS
  Filled 2018-10-27: qty 1

## 2018-10-27 MED ORDER — CHLORHEXIDINE GLUCONATE CLOTH 2 % EX PADS
6.0000 | MEDICATED_PAD | Freq: Every day | CUTANEOUS | Status: DC
  Administered 2018-10-27 – 2018-11-04 (×9): 6 via TOPICAL

## 2018-10-27 MED ORDER — SODIUM CHLORIDE 0.9% FLUSH
10.0000 mL | INTRAVENOUS | Status: DC | PRN
  Administered 2018-10-28 – 2018-10-29 (×2): 10 mL
  Filled 2018-10-27 (×2): qty 40

## 2018-10-27 MED ORDER — SALINE SPRAY 0.65 % NA SOLN
1.0000 | NASAL | Status: DC | PRN
  Administered 2018-10-27: 18:00:00 1 via NASAL
  Filled 2018-10-27 (×2): qty 44

## 2018-10-27 NOTE — Progress Notes (Signed)
Paged Dr. Orie Rout via amion about patient's persistent cough not relieved with guaifenesin and tussin ex cant be given until 0230.

## 2018-10-27 NOTE — Progress Notes (Signed)
ANTICOAGULATION CONSULT NOTE - Initial Consult  Pharmacy Consult for heparin Indication: atrial fibrillation  Allergies  Allergen Reactions  . Clarithromycin Nausea Only and Other (See Comments)    Dizzy, blurred vision, achy stomach Dizzy, blurred vision, stomach ache. unknown   . Trazodone Other (See Comments)    Weakness in legs Numbness in arms Funny feeling Rapid heart beat Loses focus. unknown   . Trazodone And Nefazodone Other (See Comments)    Weakness in legs Numbness in arms Funny feeling Rapid heart beat Lose focus  . Flecainide Nausea Only  . Naproxen Sodium Other (See Comments)    unknown  . Propafenone Hcl Nausea Only  . Sulfa Antibiotics Other (See Comments)    Blister on large toe  . Sulfamethoxazole Other (See Comments)    unknown  . Aleve [Naproxen] Nausea And Vomiting and Other (See Comments)    Stomach ache  . Doxycycline Nausea Only and Other (See Comments)    Stomach ache Stomach ache. unknown   . Durabac [Apap-Salicyl-Phenyltolox-Caff] Nausea And Vomiting  . Estradiol Nausea Only and Other (See Comments)    Hurt stomach unknown  . Ibuprofen Nausea Only and Other (See Comments)    Stomach ahce unknown  . Macrobid [Nitrofurantoin] Other (See Comments)    BLOATING  . Methocarbamol Nausea Only and Other (See Comments)    Hurt stomach unknown  . Oxaprozin Nausea Only and Other (See Comments)    Hurt stomach unknown  . Topiramate Nausea And Vomiting and Other (See Comments)    unknown  . Valdecoxib Nausea And Vomiting and Other (See Comments)    Stomach ache  unknown    Patient Measurements: Height: 5\' 7"  (170.2 cm) Weight: 193 lb 12.6 oz (87.9 kg) IBW/kg (Calculated) : 61.6 Heparin Dosing Weight: 79 kg   Vital Signs: Temp: 97.6 F (36.4 C) (06/28 0800) Temp Source: Oral (06/28 0800) BP: 118/74 (06/28 0800) Pulse Rate: 79 (06/28 0800)  Labs: Recent Labs    10/27/2018 0629 10/26/18 0300 10/27/18 0255 10/27/18 0832  HGB  8.2* 8.7* 8.0* 9.5*  HCT 27.0* 29.7* 27.0* 28.0*  PLT 233 264 286  --   CREATININE 0.95 0.72 0.75  --     Estimated Creatinine Clearance: 75.5 mL/min (by C-G formula based on SCr of 0.75 mg/dL).   Medical History: Past Medical History:  Diagnosis Date  . AC (acromioclavicular) joint bone spurs    spurs in shoulders with pain  . Anxiety   . Aortic insufficiency   . Arthritis    all over  . Chronic combined systolic and diastolic CHF (congestive heart failure) ([place])   . Constipation   . Coronary artery disease   . Deafness   . Dysrhythmia   . Fatty liver   . Fibromyalgia   . GERD (gastroesophageal reflux disease)   . Hyperlipidemia   . Hypertension   . Hypothyroidism   . IBS (irritable bowel syndrome)   . Mitral regurgitation   . Neuropathy   . Non-ischemic cardiomyopathy ([place])    a. Normal cors 01/2016.  . Osteoarthritis of glenohumeral joint, left    Left shoulder  . Osteopenia   . Persistent atrial fibrillation   . PFO (patent foramen ovale)    a. closure 03/2016.  . Pre-diabetes   . S/P aortic valve replacement with bioprosthetic valve 04/13/2016   23 mm [facility] Ease bovine pericardial tissue valve  . S/P mitral valve repair 04/13/2016   26 mm Sorin Memo 3D ring annuloplasty  . S/P tricuspid  valve repair 04/13/2016   26 mm Edwards mc3 ring annuloplasty  . Tricuspid regurgitation     Medications:  Medications Prior to Admission  Medication Sig Dispense Refill Last Dose  . acetaminophen (TYLENOL) 325 MG tablet Take 650 mg by mouth every 6 (six) hours as needed.   10/23/2018 at prn  . apixaban (ELIQUIS) 5 MG TABS tablet Take 1 tablet (5 mg total) by mouth 2 (two) times daily. 60 tablet 6 10/24/2018 at Unknown time  . atorvastatin (LIPITOR) 20 MG tablet Take 20 mg by mouth at bedtime.    10/24/2018 at Unknown time  . bisacodyl (DULCOLAX) 10 MG suppository Place 10 mg rectally as needed for moderate constipation.   unknown at prn  . bisacodyl (DULCOLAX) 5 MG  EC tablet Give 1 tablet by mouth daily as needed for constipation   unknown at prn  . Calcium 600-200 MG-UNIT tablet Take 1 tablet by mouth 2 (two) times daily.   10/24/2018 at Unknown time  . calcium carbonate (TUMS - DOSED IN MG ELEMENTAL CALCIUM) 500 MG chewable tablet Chew 1 tablet by mouth 2 (two) times daily.   10/24/2018 at Unknown time  . carvedilol (COREG) 12.5 MG tablet TAKE 1 TABLET BY MOUTH TWO  TIMES DAILY WITH MEALS (Patient taking differently: Take 12.5 mg by mouth 2 (two) times daily with a meal. ) 180 tablet 3 10/24/2018 at 12pm  . cetirizine (ZYRTEC) 10 MG tablet Take 10 mg by mouth daily.   10/24/2018 at Unknown time  . diclofenac (VOLTAREN) 75 MG EC tablet Give 1 tablet by mouth daily as needed for pain   10/21/2018 at prn  . diphenhydrAMINE (BENADRYL) 25 mg capsule Take 25 mg by mouth daily as needed for itching.    unknown at prn  . famotidine (PEPCID) 40 MG tablet Take 40 mg by mouth at bedtime. nightly for GERD   10/24/2018 at Unknown time  . ferrous sulfate 325 (65 FE) MG EC tablet Take 325 mg by mouth daily with breakfast.    10/24/2018 at Unknown time  . fluticasone (VERAMYST) 27.5 MCG/SPRAY nasal spray Place 2 sprays into the nose daily.   10/24/2018 at Unknown time  . gabapentin (NEURONTIN) 600 MG tablet Take 600 mg by mouth 3 (three) times daily.    10/24/2018 at Unknown time  . HYDROcodone-acetaminophen (NORCO/VICODIN) 5-325 MG tablet Take 1 tablet by mouth every 8 (eight) hours.    10/21/2018 at Unknown time  . levothyroxine (SYNTHROID, LEVOTHROID) 137 MCG tablet Take 137 mcg by mouth daily before breakfast.    10/24/2018 at 0630  . magnesium hydroxide (MILK OF MAGNESIA) 400 MG/5ML suspension Take 30 mLs by mouth daily as needed for mild constipation.   unknown at prn  . montelukast (SINGULAIR) 10 MG tablet Take 10 mg by mouth every evening.    10/24/2018 at Unknown time  . omeprazole (PRILOSEC) 40 MG capsule Take 40 mg by mouth daily.    10/24/2018 at Unknown time  . ondansetron  (ZOFRAN) 4 MG tablet Take 4 mg by mouth every 6 (six) hours as needed.   10/23/2018 at prn  . polyethylene glycol (MIRALAX / GLYCOLAX) 17 g packet Mix 17 gm in 4 oz of water and give by mouth daily as needed for constipation   unknown at prn  . Sodium Phosphates (RA SALINE ENEMA RE) Place 1 application rectally as needed.   unknown at prn  . tiZANidine (ZANAFLEX) 2 MG tablet Take 2 tablets (4 mg total) by mouth every 6 (  six) hours as needed for muscle spasms. 40 tablet 0 10/20/2018 at prn  . tiZANidine (ZANAFLEX) 4 MG tablet Take 4 mg by mouth every 8 (eight) hours.   10/11/2018 at Unknown time    Assessment: 67 YOF on chronic apixaban for Afib presented with coughing and hemopstyis. Diagnosed with COVID. Pharmacy consulted to start IV heparin this afternoon and d/c apixaban. H/H improved today. Plt wnl. SCr has improved to 1.7.   Goal of Therapy:  Heparin level 0.3-0.7 units/ml aPTT 66-102s seconds Monitor platelets by anticoagulation protocol: Yes   Plan:  -Start IV heparin at 1050 units/hr. No bolus -F/u 6 hr aPTT/HL -Monitor daily HL, aPTT, WBC and s/s of bleeding   Vinnie LevelBenjamin Webster Patrone, PharmD., BCPS Clinical Pharmacist Clinical phone for 10/27/18 until 5pm: [phone]

## 2018-10-27 NOTE — Progress Notes (Signed)
Paged Dr. Adefesco via amion to make him aware that patient is short of breath and desating.  MD called back and discussed patient desating and continous cough requiring non-rebreather. Suggested heated high flow and need for ICU. MD gave order to transfer patient to ICU and stated he would see the patient in ICU.  

## 2018-10-27 NOTE — Progress Notes (Signed)
Peripherally Inserted Central Catheter/Midline Placement  The IV Nurse has discussed with the patient and/or persons authorized to consent for the patient, the purpose of this procedure and the potential benefits and risks involved with this procedure.  The benefits include less needle sticks, lab draws from the catheter, and the patient may be discharged home with the catheter. Risks include, but not limited to, infection, bleeding, blood clot (thrombus formation), and puncture of an artery; nerve damage and irregular heartbeat and possibility to perform a PICC exchange if needed/ordered by physician.  Alternatives to this procedure were also discussed.  Bard Power PICC patient education guide, fact sheet on infection prevention and patient information card has been provided to patient /or left at bedside.    PICC/Midline Placement Documentation  PICC Double Lumen 10/27/18 PICC Right Brachial 37 cm 1 cm (Active)  Indication for Insertion or Continuance of Line Vasoactive infusions 10/27/18 1400  Exposed Catheter (cm) 1 cm 10/27/18 1400  Site Assessment Clean;Dry;Intact 10/27/18 1400  Lumen #1 Status Flushed;Saline locked;Blood return noted 10/27/18 1400  Lumen #2 Status Flushed;Saline locked;Blood return noted 10/27/18 1400  Dressing Type Transparent;Securing device 10/27/18 1400  Dressing Status Clean;Dry;Intact;Antimicrobial disc in place 10/27/18 1400  [place] checked and tightened 10/27/18 1400  Dressing Intervention New dressing 10/27/18 1400  Dressing Change Due 11/03/18 10/27/18 1400       Virgilio Belling 10/27/2018, 3:01 PM

## 2018-10-27 NOTE — Progress Notes (Signed)
Called and spoke with patient's daughter [name], and updated her on patient's status and events that occurred overnight requiring patient to be transferred to ICU to be placed on heated high flow.  Answered questions and daughter seemed satisfied.

## 2018-10-27 NOTE — Progress Notes (Signed)
NAME:  Allison [name], MRN:  [id number], DOB:  1951-02-23, LOS: 2 ADMISSION DATE:  11-07-2018, CONSULTATION DATE:  10/27/2018 REFERRING MD:  Jerral Ralph, CHIEF COMPLAINT:  Dyspnea   Brief History   68 year old female with hypertension and combined systolic/diastolic heart failure admitted to [facility] on 11/07/2018 from a nursing home in the setting of acute respiratory failure with hypoxemia from COVID-19 pneumonia.  History of present illness   This is a 68 year old female who has multiple cardiovascular comorbid illnesses including valvular heart disease for which she takes Eliquis, atrial fibrillation who contracted COVID-19 pneumonia and was admitted to our facility for the same.  She had a fall early in June and suffered a leg fracture and underwent open reduction and internal fixation of her right distal femur at [facility]  She was transferred to [facility].  There she tested positive for COVID-19.  She presented to [facility] on June 26 with hemoptysis, subjective fever, malaise, some abdominal pain.  After admission she was treated with steroids, remdesivir, Actemra, and convalescent plasma.  She developed worsening hypoxemia with coughing spells and was transferred to the intensive care unit early in the morning on October 27, 2018 for closer observation.  This morning she says she feels a little short of breath but it is not too bad, perhaps a little better than yesterday.  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, mitral valve repair, aortic valve replacement Nonischemic cardiomyopathy Patent foramen ovale Permanent atrial fibrillation Hypothyroidism Hypertension Hyperlipidemia GERD Fibromyalgia Fatty liver   Significant Hospital Events   6/26 admission 6/28 transfer to ICU for increasing O2 needs  Consults:  PCCM  Procedures:    Significant Diagnostic Tests:  2018 transthoracic  echocardiogram showed bioprosthetic aortic and mitral valves, left atrium dilation, LVEF 50 to 55%  Micro Data:  6/24 SARS-COV2> positive 6/26 blood culture > NGTD  Antimicrobials:  6/26 Ceftriaxone > x1 6/26 Azithro > x1 6/26 Remdesivir >  6/26 Actemra x1 627 convalescent plasma   Interim history/subjective:  As above  Objective   Blood pressure 105/74, pulse 68, temperature (!) 96.2 F (35.7 C), temperature source Axillary, resp. rate 18, height 5\' 7"  (1.702 m), weight 87.9 kg, SpO2 (!) 83 %.    FiO2 (%):  [100 %] 100 %   Intake/Output Summary (Last 24 hours) at 10/27/2018 0714 Last data filed at 10/27/2018 0535 Gross per 24 hour  Intake 1500.96 ml  Output 500 ml  Net 1000.96 ml   Filed Weights   11-07-2018 0552 11/07/2018 1346 10/26/18 0500  Weight: 82.1 kg 84.1 kg 87.9 kg    Examination:  General:  Resting comfortably in bed HENT: NCAT OP clear PULM: Few crackles bases B, normal effort CV: RRR, no mgr GI: BS+, soft, nontender MSK: normal bulk and tone Neuro: awake, alert, no distress, MAEW   June 27 chest x-ray images independently reviewed showing diffuse interstitial opacities with consolidation in the right upper lobe, bilateral shoulder replacements  Resolved Hospital Problem list     Assessment & Plan:  Acute respiratory failure with hypoxemia due to COVID-19 pneumonia Hypoxemia currently worse than symptom of dyspnea Currently showing no signs of ventilatory failure Continue remdesivir Continue solumedrol Maintain in ICU, continue heated high flow to maintain O2 saturation greater than 85% Tolerate periods of hypoxemia, goal at rest is greater than 85% SaO2, with movement ideally above 75% Decision for intubation should be based on a change in mental  status or physical evidence of ventilatory failure such as nasal flaring, accessory muscle use, paradoxical breathing Out of bed to chair as able Prone positioning while in bed > encouraged at length today   History of aortic/mitral valve replacement, diastolic heart failure. On Eliquis for atrial fibrillation Mild hemoptysis Hold Eliquis Use heparin infusion today, hold if hemoptysis worsens  Best practice:  Diet: regular diet Pain/Anxiety/Delirium protocol (if indicated): n/a VAP protocol (if indicated): n/a DVT prophylaxis: heparin infusion GI prophylaxis: n/a Glucose control: SSI Mobility: bed rest Code Status: full Family Communication: I called her daughter [name] this morning and updated her Disposition: remain in ICU  Labs   CBC: Recent Labs  Lab 11-04-18 0629 10/26/18 0300 10/27/18 0255  WBC 3.8* 2.7* 4.5  NEUTROABS 2.6 1.9 3.5  HGB 8.2* 8.7* 8.0*  HCT 27.0* 29.7* 27.0*  MCV 86.8 88.1 87.7  PLT 233 264 604    Basic Metabolic Panel: Recent Labs  Lab November 04, 2018 0629 10/26/18 0300 10/27/18 0255  NA 130* 142 136  K 3.9 3.7 4.1  CL 92* 95* 101  CO2 27 27 28   GLUCOSE 90 169* 144*  BUN 11 15 18   CREATININE 0.95 0.72 0.75  CALCIUM 7.9* 8.7* 8.1*  MG  --  2.0 2.0   GFR: Estimated Creatinine Clearance: 75.5 mL/min (by C-G formula based on SCr of 0.75 mg/dL). Recent Labs  Lab 04-Nov-2018 0628 Nov 04, 2018 0629 2018-11-04 1650 10/26/18 0300 10/27/18 0255  PROCALCITON  --  0.47  --   --   --   WBC  --  3.8*  --  2.7* 4.5  LATICACIDVEN 0.9  --  1.0  --   --     Liver Function Tests: Recent Labs  Lab Nov 04, 2018 0629 10/26/18 0300 10/27/18 0255  AST 25 30 33  ALT 11 14 15   ALKPHOS 94 89 78  BILITOT 0.5 0.3 0.2*  PROT 5.6* 6.2* 5.3*  ALBUMIN 2.6* 2.7* 2.4*   No results for input(s): LIPASE, AMYLASE in the last 168 hours. No results for input(s): AMMONIA in the last 168 hours.  ABG    Component Value Date/Time   PHART 7.346 (L) 04/14/2016 0531   PCO2ART 35.3 04/14/2016 0531   PO2ART 79.0 (L) 04/14/2016 0531   HCO3 19.3 (L) 04/14/2016 0531   TCO2 21 04/14/2016 1641   ACIDBASEDEF 6.0 (H) 04/14/2016 0531   O2SAT 95.0 04/14/2016 0531     Coagulation  Profile: No results for input(s): INR, PROTIME in the last 168 hours.  Cardiac Enzymes: No results for input(s): CKTOTAL, CKMB, CKMBINDEX, TROPONINI in the last 168 hours.  HbA1C: Hgb A1c MFr Bld  Date/Time Value Ref Range Status  04/10/2016 04:26 PM 5.9 (H) 4.8 - 5.6 % Final    Comment:    (NOTE)         Pre-diabetes: 5.7 - 6.4         Diabetes: >6.4         Glycemic control for adults with diabetes: <7.0     CBG: No results for input(s): GLUCAP in the last 168 hours.   Critical care time: 31 minutes     Roselie Awkward, MD [place] [facility] Pager: [phone] Cell: [phone] If no response, call [phone]

## 2018-10-27 NOTE — Progress Notes (Signed)
OT Cancellation Note  Patient Details Name: Allison [name] MRN: [id number] DOB: 1951/01/05   Cancelled Treatment:    Reason Eval/Treat Not Completed: Medical issues which prohibited therapy;Patient not medically ready (Pt on 55L and SpO2 fluctuating in 80s. Possible plan for intubation pending respiratory status. Will hold and return as schedule allows. Thank you.)  [place], OTR/L Acute Rehab Pager: [phone] Office: [phone] 10/27/2018, 12:38 PM

## 2018-10-27 NOTE — Progress Notes (Signed)
Patient continues to cough even after throat lozenge given. Tussionex can not be given until 0230 and MD aware. Desats with coughing and sats staying in 70's-80's consistently with cough therefore non-rebreather placed on patient.  o2 sats now 89-91%.  Denies shortness of breath. A&Ox4.

## 2018-10-27 NOTE — Progress Notes (Signed)
RN made Dr. Orie Rout aware that patient's temperature has been around 96 over night but that patient does not want any blankets on her.  MD acknowledged and gave no new orders.

## 2018-10-27 NOTE — Progress Notes (Signed)
Spoke with Thurmond Butts, RN concerning order for PICC. One PIV is listed in line log, but per Thurmond Butts, RN she doesn't have a functioning PIV. Patient can sign consent. PICC to be placed today.

## 2018-10-27 NOTE — Progress Notes (Signed)
Report called to Marden Noble, RN in ICU. Patient will be moved to 2nd floor ICU. Dr. Orie Rout at bedside and assessed patient. Patient asking for something for her nerves. MD gave order for 0.25 mg IV ativan once. Patient alert with mild labored breathing. Sherry, RT at bedside for transport. Danae Chen, RN charge nurse and Judeen Hammans, RT will take patient to ICU.

## 2018-10-27 NOTE — Progress Notes (Signed)
Called and spoke with patient's daughter, [name] and provided a full update/answered all questions. Georges Lynch that patient is currently on the highest amount of HHF that we can give her and if patient continues to decline, she may need a breathing tube. Reiterated that I would call and update her if that were to happen.

## 2018-10-27 NOTE — Progress Notes (Addendum)
PROGRESS NOTE                                                                                                                                                                                                             Patient Demographics:    Allison [name], is a 68 y.o. female, DOB - 28-Oct-1950, CBU:[id number]  Outpatient Primary MD for the patient is Wenda Low, MD    LOS - 2  Admit date - 10/12/2018    Chief Complaint  Patient presents with   Shortness of Breath       Brief Narrative  Patient is a 68 y.o. female with PMHx of chronic systolic and diastolic heart failure, atrial fibrillation on Eliquis, s/p bioprosthetic aortic valve replacement, s/p mitral and tricuspid valve repair, closure of patent foreman ovale with maze procedure on 04/13/2016-underwent ORIF for right distal femur fracture at [facility] on 6/7- following which she was discharged to SNF.  She presented to the hospital on 6/26 with approximately 5-6-day history of cough along with minimal hemoptysis, subjective fever, malaise and worsening shortness of breath.  She was subsequently diagnosed with COVID-19-and found to have acute hypoxic respiratory failure secondary to COVID-19 pneumonia.  She was then admitted to the hospitalist service at [facility].   Subjective:    [name] today has, No headache, No chest pain, No abdominal pain - No Nausea, No new weakness tingling or numbness, mild Cough, SOB.     Assessment  & Plan :     1. Acute Hypoxic Resp. Failure due to Acute Covid 19 Viral Pneumonitis during the ongoing 2020 Covid 19 Pandemic -  Has received maximal treatment.  6/25>> Solu-Medrol 6/25>> Remdesivir 6/25>> Actemra 6/26 >> Plasma  Continues to be severely hypoxic on high flow nasal cannula oxygen and ICU, overall does not appear to be in any distress, continue monitoring with supportive care, refuses  to get prone.  Will encourage to sit up in chair and use flutter valve for pulmonary toiletry.  PCCM on board.  For now continue IV steroids.  FiO2 (%):  [100 %] 100 %  ABG     Component Value Date/Time   PHART 7.456 (H) 10/27/2018 0832   PCO2ART 39.2 10/27/2018 0832   PO2ART 55.0 (L) 10/27/2018 0832   HCO3 27.6 10/27/2018 0832   TCO2  29 10/27/2018 0832   ACIDBASEDEF 6.0 (H) 04/14/2016 0531   O2SAT 90.0 10/27/2018 0832    COVID-19 Labs  Recent Labs    09/30/2018 0629 10/26/18 0300 10/27/18 0255  DDIMER 2.62* 2.41* 1.70*  FERRITIN 350* 421* 511*  LDH 313*  --   --   CRP 17.9* 17.2* 8.1*    No results found for: SARSCOV2NAA   Hepatic Function Latest Ref Rng & Units 10/27/2018 10/26/2018 10/10/2018  Total Protein 6.5 - 8.1 g/dL 5.3(L) 6.2(L) 5.6(L)  Albumin 3.5 - 5.0 g/dL 2.4(L) 2.7(L) 2.6(L)  AST 15 - 41 U/L 33 30 25  ALT 0 - 44 U/L '15 14 11  ' Alk Phosphatase 38 - 126 U/L 78 89 94  Total Bilirubin 0.3 - 1.2 mg/dL 0.2(L) 0.3 0.5        Component Value Date/Time   BNP 211.7 (H) 10/26/2018 0300   BNP 310.6 (H) 01/20/2016 1435      2.  Persistent cough related mild bronchial injury causing sputum laced with few streaks of blood - do think this is true hemoptysis, will continue to monitor although for now will hold Eliquis and put her on non-bolused heparin drip and monitor clinically.  3.  History of paroxysmal atrial fibrillation status post maze procedure in the past.  Mali vas 2 score will be at least 4.  For now heparin drip, once stable resume Eliquis, currently in sinus.  4.  Chronic Systolic CHF.  EF 55% on last echocardiogram in January 2018.  Currently compensated, monitor intake and output, monitor clinically.  5.  Acute closed displaced periprosthetic fracture of the distal femur involving the right knee replacement-status post ORIF 6/7; -continue nonweightbearing in the R.Leg, continue right knee brace, PT and SNF.  6.  Essential hypertension.  Stable off  medications.  7.  History of bioprosthetic aortic valve replacement, mitral valve repair, tricuspid valve repair.  History of PFO closure.  Supportive care.  8.  Hypothyroidism.  On Synthroid.  9.  Deafness.  Supportive care.  10. Underlying history of asthma.  Stable no wheezing.  11. GERD.  On PPI.    Condition - Extremely Guarded  Family Communication  :  None  Code Status :  Full  Diet :   Diet Order            Diet Heart Room service appropriate? Yes; Fluid consistency: Thin  Diet effective now               Disposition Plan  :  ICU  Consults  :  PCCM  Procedures  :    PICC - 10/27/18  PUD Prophylaxis : Famotidine  DVT Prophylaxis  :  Eliquis/ Heparin gtt  Lab Results  Component Value Date   PLT 286 10/27/2018    Inpatient Medications  Scheduled Meds:  atorvastatin  20 mg Oral q1800   benzonatate  200 mg Oral TID   calcium-vitamin D  1 tablet Oral BID   famotidine  20 mg Oral BID   ferrous sulfate  325 mg Oral Q breakfast   fluticasone  2 spray Each Nare Daily   gabapentin  600 mg Oral TID   Ipratropium-Albuterol  1 puff Inhalation QID   levothyroxine  137 mcg Oral QAC breakfast   loratadine  10 mg Oral Daily   methylPREDNISolone (SOLU-MEDROL) injection  40 mg Intravenous Q12H   montelukast  10 mg Oral QPM   polyethylene glycol  17 g Oral Daily   Continuous Infusions:  heparin  remdesivir 100 mg in NS 250 mL Stopped (10/26/18 1533)   PRN Meds:.acetaminophen, bisacodyl, chlorpheniramine-HYDROcodone, diphenhydrAMINE, guaiFENesin-dextromethorphan, HYDROcodone-acetaminophen, menthol-cetylpyridinium, [DISCONTINUED] ondansetron **OR** ondansetron (ZOFRAN) IV, simethicone, tiZANidine  Antibiotics  :    Anti-infectives (From admission, onward)   Start     Dose/Rate Route Frequency Ordered Stop   10/26/18 1600  remdesivir 100 mg in sodium chloride 0.9 % 250 mL IVPB     100 mg 500 mL/hr over 30 Minutes Intravenous Every 24 hours  10/21/2018 1440 10/30/18 1559   10/23/2018 1600  remdesivir 200 mg in sodium chloride 0.9 % 250 mL IVPB     200 mg 500 mL/hr over 30 Minutes Intravenous Once 10/06/2018 1440 10/07/2018 1757   10/07/2018 0830  cefTRIAXone (ROCEPHIN) 2 g in sodium chloride 0.9 % 100 mL IVPB  Status:  Discontinued     2 g 200 mL/hr over 30 Minutes Intravenous Every 24 hours 10/07/2018 0807 10/28/2018 1509   10/11/2018 0830  azithromycin (ZITHROMAX) 500 mg in sodium chloride 0.9 % 250 mL IVPB  Status:  Discontinued     500 mg 250 mL/hr over 60 Minutes Intravenous Every 24 hours 10/24/2018 0807 10/02/2018 1509       Time Spent in minutes  30   Lala Lund M.D on 10/27/2018 at 9:23 AM  To page go to [email or web address] - password [facility]  [facility] -  Office  [phone]    See all Orders from today for further details    Objective:   Vitals:   10/27/18 0715 10/27/18 0742 10/27/18 0745 10/27/18 0800  BP: (!) 118/58 92/78 117/64 118/74  Pulse: 65 82 68 79  Resp: (!) 21 18 (!) 26 (!) 24  Temp:    97.6 F (36.4 C)  TempSrc:    Oral  SpO2: 97% 92%  (!) 87%  Weight:      Height:        Wt Readings from Last 3 Encounters:  10/26/18 87.9 kg  03/19/18 74.8 kg  01/02/18 78.5 kg     Intake/Output Summary (Last 24 hours) at 10/27/2018 0923 Last data filed at 10/27/2018 0730 Gross per 24 hour  Intake 1680.96 ml  Output 500 ml  Net 1180.96 ml     Physical Exam  Awake Alert,   No new F.N deficits,   [place].AT,PERRAL Supple Neck,No JVD, No cervical lymphadenopathy appriciated.  Symmetrical Chest wall movement, Good air movement bilaterally, few fine rales RRR,No Gallops,Rubs or new Murmurs, No Parasternal Heave +ve B.Sounds, Abd Soft, No tenderness, No organomegaly appriciated, No rebound - guarding or rigidity. No Cyanosis, Clubbing or edema, R.Hip-Knee bruised   Data Review:    CBC Recent Labs  Lab 10/07/2018 0629 10/26/18 0300 10/27/18 0255 10/27/18 0832  WBC 3.8* 2.7* 4.5  --   HGB 8.2* 8.7* 8.0*  9.5*  HCT 27.0* 29.7* 27.0* 28.0*  PLT 233 264 286  --   MCV 86.8 88.1 87.7  --   MCH 26.4 25.8* 26.0  --   MCHC 30.4 29.3* 29.6*  --   RDW 16.8* 16.7* 16.5*  --   LYMPHSABS 1.0 0.7 0.6*  --   MONOABS 0.3 0.2 0.3  --   EOSABS 0.0 0.0 0.0  --   BASOSABS 0.0 0.0 0.0  --     Chemistries  Recent Labs  Lab 10/21/2018 0629 10/26/18 0300 10/27/18 0255 10/27/18 0832  NA 130* 142 136 134*  K 3.9 3.7 4.1 4.0  CL 92* 95* 101  --   CO2 '27 27 28  ' --  GLUCOSE 90 169* 144*  --   BUN '11 15 18  ' --   CREATININE 0.95 0.72 0.75  --   CALCIUM 7.9* 8.7* 8.1*  --   MG  --  2.0 2.0  --   AST 25 30 33  --   ALT '11 14 15  ' --   ALKPHOS 94 89 78  --   BILITOT 0.5 0.3 0.2*  --    ------------------------------------------------------------------------------------------------------------------ Recent Labs    10/12/2018 0630  TRIG 64    Lab Results  Component Value Date   HGBA1C 5.9 (H) 04/10/2016   ------------------------------------------------------------------------------------------------------------------ No results for input(s): TSH, T4TOTAL, T3FREE, THYROIDAB in the last 72 hours.  Invalid input(s): FREET3  Cardiac Enzymes No results for input(s): CKMB, TROPONINI, MYOGLOBIN in the last 168 hours.  Invalid input(s): CK ------------------------------------------------------------------------------------------------------------------    Component Value Date/Time   BNP 211.7 (H) 10/26/2018 0300   BNP 310.6 (H) 01/20/2016 1435    Micro Results Recent Results (from the past 240 hour(s))  Blood Culture (routine x 2)     Status: None (Preliminary result)   Collection Time: 10/28/2018  6:29 AM   Specimen: BLOOD  Result Value Ref Range Status   Specimen Description BLOOD LEFT ARM  Final   Special Requests   Final    BOTTLES DRAWN AEROBIC AND ANAEROBIC Blood Culture results may not be optimal due to an excessive volume of blood received in culture bottles   Culture   Final    NO  GROWTH 2 DAYS Performed at [facility], [place] [address], [place], [place] [address]    Report Status PENDING  Incomplete  Blood Culture (routine x 2)     Status: None (Preliminary result)   Collection Time: 10/28/2018  7:29 AM   Specimen: BLOOD  Result Value Ref Range Status   Specimen Description BLOOD SITE NOT SPECIFIED  Final   Special Requests   Final    BOTTLES DRAWN AEROBIC AND ANAEROBIC Blood Culture adequate volume   Culture   Final    NO GROWTH 2 DAYS Performed at [facility], [address], [place], [place] [address]    Report Status PENDING  Incomplete    Radiology Reports Dg Chest Port 1 View  Result Date: 10/23/2018 CLINICAL DATA:  Cough for 3 days.  Positive COVID-19 EXAM: PORTABLE CHEST 1 VIEW COMPARISON:  12/20/2017 FINDINGS: Borderline heart size. Three valves have been replaced and there is left atrial clipping. Patchy bilateral lung opacity likely related to COVID-19 positivity. No effusion or pneumothorax. Lung volumes are low. IMPRESSION: Bilateral pneumonia in the setting of COVID-19. Electronically Signed   By: Monte Fantasia M.D.   On: 10/09/2018 06:35   Dg Chest Port 1v Same Day  Result Date: 10/26/2018 CLINICAL DATA:  Shortness of breath EXAM: PORTABLE CHEST 1 VIEW COMPARISON:  Yesterday FINDINGS: Improved lung volumes and mild decrease in opacity. There is still extensive patchy bilateral airspace disease in the setting of COVID-19. Densest opacity in the right upper lobe. Postoperative heart with replaced valves. No effusion or pneumothorax. IMPRESSION: Improved lung volumes and aeration, but still extensive pneumonia asymmetric to the right upper lobe. Electronically Signed   By: Monte Fantasia M.D.   On: 10/26/2018 08:45   [place] Ekg Site Rite  Result Date: 10/27/2018 If Site Rite image not attached, placement could not be confirmed due to current cardiac rhythm.

## 2018-10-27 NOTE — Progress Notes (Signed)
Patient moved to ICU by bed on non re-breather with Danae Chen, RN and Judeen Hammans, RT. Patient on tele. Alert when leaving PCU.

## 2018-10-28 ENCOUNTER — Inpatient Hospital Stay (HOSPITAL_COMMUNITY): Payer: Medicare Other

## 2018-10-28 DIAGNOSIS — I5042 Chronic combined systolic (congestive) and diastolic (congestive) heart failure: Secondary | ICD-10-CM

## 2018-10-28 LAB — COMPREHENSIVE METABOLIC PANEL
ALT: 15 U/L (ref 0–44)
AST: 32 U/L (ref 15–41)
Albumin: 2.6 g/dL — ABNORMAL LOW (ref 3.5–5.0)
Alkaline Phosphatase: 80 U/L (ref 38–126)
Anion gap: 9 (ref 5–15)
BUN: 17 mg/dL (ref 8–23)
CO2: 28 mmol/L (ref 22–32)
Calcium: 8.1 mg/dL — ABNORMAL LOW (ref 8.9–10.3)
Chloride: 100 mmol/L (ref 98–111)
Creatinine, Ser: 0.78 mg/dL (ref 0.44–1.00)
GFR calc Af Amer: >60 mL/min (ref >60)
GFR calc non Af Amer: >60 mL/min (ref >60)
Glucose, Bld: 117 mg/dL — ABNORMAL HIGH (ref 70–99)
Potassium: 3.8 mmol/L (ref 3.5–5.1)
Sodium: 137 mmol/L (ref 135–145)
Total Bilirubin: 0.4 mg/dL (ref 0.3–1.2)
Total Protein: 5.2 g/dL — ABNORMAL LOW (ref 6.5–8.1)

## 2018-10-28 LAB — APTT
aPTT: 59 seconds — ABNORMAL HIGH (ref 24–36)
aPTT: 73 seconds — ABNORMAL HIGH (ref 24–36)
aPTT: 85 seconds — ABNORMAL HIGH (ref 24–36)

## 2018-10-28 LAB — CBC WITH DIFFERENTIAL/PLATELET
Abs Immature Granulocytes: 0.03 10*3/uL (ref 0.00–0.07)
Basophils Absolute: 0 10*3/uL (ref 0.0–0.1)
Basophils Relative: 0 %
Eosinophils Absolute: 0 10*3/uL (ref 0.0–0.5)
Eosinophils Relative: 0 %
HCT: 29.2 % — ABNORMAL LOW (ref 36.0–46.0)
Hemoglobin: 8.6 g/dL — ABNORMAL LOW (ref 12.0–15.0)
Immature Granulocytes: 1 %
Lymphocytes Relative: 11 %
Lymphs Abs: 0.7 10*3/uL (ref 0.7–4.0)
MCH: 25.6 pg — ABNORMAL LOW (ref 26.0–34.0)
MCHC: 29.5 g/dL — ABNORMAL LOW (ref 30.0–36.0)
MCV: 86.9 fL (ref 80.0–100.0)
Monocytes Absolute: 0.4 10*3/uL (ref 0.1–1.0)
Monocytes Relative: 6 %
Neutro Abs: 4.8 10*3/uL (ref 1.7–7.7)
Neutrophils Relative %: 82 %
Platelets: 312 10*3/uL (ref 150–400)
RBC: 3.36 MIL/uL — ABNORMAL LOW (ref 3.87–5.11)
RDW: 16.4 % — ABNORMAL HIGH (ref 11.5–15.5)
WBC: 5.9 10*3/uL (ref 4.0–10.5)
nRBC: 0 % (ref 0.0–0.2)

## 2018-10-28 LAB — MRSA PCR SCREENING: MRSA by PCR: NEGATIVE

## 2018-10-28 LAB — C-REACTIVE PROTEIN: CRP: 5.1 mg/dL — ABNORMAL HIGH (ref <1.0)

## 2018-10-28 LAB — FERRITIN: Ferritin: 414 ng/mL — ABNORMAL HIGH (ref 11–307)

## 2018-10-28 LAB — D-DIMER, QUANTITATIVE: D-Dimer, Quant: 1.55 ug/mL-FEU — ABNORMAL HIGH (ref 0.00–0.50)

## 2018-10-28 LAB — HEPARIN LEVEL (UNFRACTIONATED): Heparin Unfractionated: 1.22 IU/mL — ABNORMAL HIGH (ref 0.30–0.70)

## 2018-10-28 LAB — MAGNESIUM: Magnesium: 2 mg/dL (ref 1.7–2.4)

## 2018-10-28 MED ORDER — IPRATROPIUM-ALBUTEROL 20-100 MCG/ACT IN AERS
1.0000 | INHALATION_SPRAY | Freq: Four times a day (QID) | RESPIRATORY_TRACT | Status: DC | PRN
  Filled 2018-10-28: qty 4

## 2018-10-28 MED ORDER — PNEUMOCOCCAL VAC POLYVALENT 25 MCG/0.5ML IJ INJ
0.5000 mL | INJECTION | INTRAMUSCULAR | Status: AC
  Administered 2018-10-29: 0.5 mL via INTRAMUSCULAR
  Filled 2018-10-28: qty 0.5

## 2018-10-28 MED ORDER — FUROSEMIDE 10 MG/ML IJ SOLN
40.0000 mg | Freq: Once | INTRAMUSCULAR | Status: AC
  Administered 2018-10-28: 40 mg via INTRAVENOUS
  Filled 2018-10-28: qty 4

## 2018-10-28 MED ORDER — BISACODYL 10 MG RE SUPP
10.0000 mg | Freq: Every day | RECTAL | Status: DC
  Administered 2018-10-28: 09:00:00 10 mg via RECTAL
  Filled 2018-10-28 (×2): qty 1

## 2018-10-28 MED ORDER — HEPARIN (PORCINE) 25000 UT/250ML-% IV SOLN
1150.0000 [IU]/h | INTRAVENOUS | Status: AC
  Administered 2018-10-28 (×2): 1150 [IU]/h via INTRAVENOUS
  Filled 2018-10-28: qty 250

## 2018-10-28 NOTE — Progress Notes (Signed)
PROGRESS NOTE                                                                                                                                                                                                             Patient Demographics:    Allison [name], is a 68 y.o. female, DOB - 1951-04-11, ZWC:[id number]  Outpatient Primary MD for the patient is Wenda Low, MD    LOS - 3  Admit date - 10/29/2018    Chief Complaint  Patient presents with  . Shortness of Breath       Brief Narrative  Patient is a 68 y.o. female with PMHx of chronic systolic and diastolic heart failure, atrial fibrillation on Eliquis, s/p bioprosthetic aortic valve replacement, s/p mitral and tricuspid valve repair, closure of patent foreman ovale with maze procedure on 04/13/2016-underwent ORIF for right distal femur fracture at [facility] on 6/7- following which she was discharged to SNF.  She presented to the hospital on 6/26 with approximately 5-6-day history of cough along with minimal hemoptysis, subjective fever, malaise and worsening shortness of breath.  She was subsequently diagnosed with COVID-19-and found to have acute hypoxic respiratory failure secondary to COVID-19 pneumonia.  She was then admitted to the hospitalist service at [facility].   Subjective:   Patient in bed, appears comfortable, denies any headache, no fever, no chest pain or pressure, improved shortness of breath with minimal to no blood streaks in the sputum, no abdominal pain. No focal weakness.   Assessment  & Plan :     1. Acute Hypoxic Resp. Failure due to Acute Covid 19 Viral Pneumonitis during the ongoing 2020 Covid 19 Pandemic -  Has received maximal treatment.  6/25>> Solu-Medrol 6/25>> Remdesivir 6/25>> Actemra 6/26 >> Plasma  Continues to be severely hypoxic on high flow nasal cannula oxygen and ICU, overall does not appear to be  in any distress, continue monitoring with supportive care, refuses to get prone.  Will encourage to sit up in chair and use flutter valve for pulmonary toiletry.  PCCM on board.  For now continue IV steroids.  FiO2 (%):  [100 %] 100 %  ABG     Component Value Date/Time   PHART 7.456 (H) 10/27/2018 0832   PCO2ART 39.2 10/27/2018 0832   PO2ART 55.0 (L) 10/27/2018 2353  HCO3 27.6 10/27/2018 0832   TCO2 29 10/27/2018 0832   ACIDBASEDEF 6.0 (H) 04/14/2016 0531   O2SAT 90.0 10/27/2018 0832    COVID-19 Labs  Recent Labs    10/26/18 0300 10/27/18 0255 10/28/18 0030 10/28/18 0500  DDIMER 2.41* 1.70*  --  1.55*  FERRITIN 421* 511* 414*  --   CRP 17.2* 8.1* 5.1*  --     No results found for: SARSCOV2NAA   Hepatic Function Latest Ref Rng & Units 10/28/2018 10/27/2018 10/26/2018  Total Protein 6.5 - 8.1 g/dL 5.2(L) 5.3(L) 6.2(L)  Albumin 3.5 - 5.0 g/dL 2.6(L) 2.4(L) 2.7(L)  AST 15 - 41 U/L 32 33 30  ALT 0 - 44 U/L '15 15 14  ' Alk Phosphatase 38 - 126 U/L 80 78 89  Total Bilirubin 0.3 - 1.2 mg/dL 0.4 0.2(L) 0.3        Component Value Date/Time   BNP 211.7 (H) 10/26/2018 0300   BNP 310.6 (H) 01/20/2016 1435      2.  Persistent cough related mild bronchial injury causing sputum laced with few streaks of blood - do think this is true hemoptysis, will continue to monitor although for now will hold Eliquis and put her on non-bolused heparin drip and monitor clinically.  3.  History of paroxysmal atrial fibrillation status post maze procedure in the past.  Mali vas 2 score will be at least 4.  For now heparin drip, once stable resume Eliquis, currently in sinus.  4.  Mild acute on chronic diastolic CHF.  EF 55% on last echocardiogram in January 2018.  Few crackles on 10/28/2018, IV Lasix and monitor.  5.  Acute closed displaced periprosthetic fracture of the distal femur involving the right knee replacement-status post ORIF 10/06/18; -continue nonweightbearing in the R.Leg, continue right  knee brace, PT and SNF upon DC.  6.  Essential hypertension.  Stable off medications.  7.  History of bioprosthetic aortic valve replacement, mitral valve repair, tricuspid valve repair.  History of PFO closure.  Supportive care.  8.  Hypothyroidism.  On Synthroid.  9.  Deafness.  Supportive care.  10. Underlying history of asthma.  Stable no wheezing.  11. GERD.  On PPI.    Condition - Extremely Guarded  Family Communication  :  None  Code Status :  Full  Diet :   Diet Order            Diet Heart Room service appropriate? Yes; Fluid consistency: Thin  Diet effective now               Disposition Plan  :  ICU  Consults  :  PCCM  Procedures  :    PICC - 10/27/18  PUD Prophylaxis : Famotidine  DVT Prophylaxis  :  Eliquis/ Heparin gtt  Lab Results  Component Value Date   PLT 312 10/28/2018    Inpatient Medications  Scheduled Meds: . atorvastatin  20 mg Oral q1800  . benzonatate  200 mg Oral TID  . bisacodyl  10 mg Rectal Daily  . calcium-vitamin D  1 tablet Oral BID  . Chlorhexidine Gluconate Cloth  6 each Topical Daily  . famotidine  20 mg Oral BID  . ferrous sulfate  325 mg Oral Q breakfast  . fluticasone  2 spray Each Nare Daily  . gabapentin  600 mg Oral TID  . Ipratropium-Albuterol  1 puff Inhalation QID  . levothyroxine  137 mcg Oral QAC breakfast  . loratadine  10 mg Oral Daily  .  methylPREDNISolone (SOLU-MEDROL) injection  40 mg Intravenous Q12H  . montelukast  10 mg Oral QPM  . [START ON 10/29/2018] pneumococcal 23 valent vaccine  0.5 mL Intramuscular Tomorrow-1000  . polyethylene glycol  17 g Oral Daily  . sodium chloride flush  10-40 mL Intracatheter Q12H   Continuous Infusions: . heparin 1,150 Units/hr (10/28/18 0900)  . remdesivir 100 mg in NS 250 mL Stopped (10/27/18 1615)   PRN Meds:.acetaminophen, bisacodyl, chlorpheniramine-HYDROcodone, diphenhydrAMINE, guaiFENesin-dextromethorphan, HYDROcodone-acetaminophen,  menthol-cetylpyridinium, [DISCONTINUED] ondansetron **OR** ondansetron (ZOFRAN) IV, simethicone, sodium chloride, sodium chloride flush, tiZANidine  Antibiotics  :    Anti-infectives (From admission, onward)   Start     Dose/Rate Route Frequency Ordered Stop   10/26/18 1600  remdesivir 100 mg in sodium chloride 0.9 % 250 mL IVPB     100 mg 500 mL/hr over 30 Minutes Intravenous Every 24 hours 10/14/2018 1440 10/30/18 1559   10/15/2018 1600  remdesivir 200 mg in sodium chloride 0.9 % 250 mL IVPB     200 mg 500 mL/hr over 30 Minutes Intravenous Once 10/20/2018 1440 10/12/2018 1757   10/27/2018 0830  cefTRIAXone (ROCEPHIN) 2 g in sodium chloride 0.9 % 100 mL IVPB  Status:  Discontinued     2 g 200 mL/hr over 30 Minutes Intravenous Every 24 hours 10/27/2018 0807 10/23/2018 1509   10/11/2018 0830  azithromycin (ZITHROMAX) 500 mg in sodium chloride 0.9 % 250 mL IVPB  Status:  Discontinued     500 mg 250 mL/hr over 60 Minutes Intravenous Every 24 hours 10/13/2018 0807 10/01/2018 1509       Time Spent in minutes  30   Lala Lund M.D on 10/28/2018 at 9:23 AM  To page go to [email or web address] - password [facility]  [facility] -  Office  [phone]    See all Orders from today for further details    Objective:   Vitals:   10/28/18 0700 10/28/18 0731 10/28/18 0800 10/28/18 0900  BP: 123/66  128/89 (!) 113/55  Pulse: 60  60 64  Resp: (!) 23  (!) 21 (!) 21  Temp:  (!) 96.6 F (35.9 C)    TempSrc:  Axillary    SpO2: 100%     Weight:      Height:        Wt Readings from Last 3 Encounters:  10/28/18 86.3 kg  03/19/18 74.8 kg  01/02/18 78.5 kg     Intake/Output Summary (Last 24 hours) at 10/28/2018 0923 Last data filed at 10/28/2018 0900 Gross per 24 hour  Intake 750.56 ml  Output 850 ml  Net -99.44 ml     Physical Exam  Awake Alert, No new F.N deficits, deaf  [place].AT,PERRAL Supple Neck,No JVD, No cervical lymphadenopathy appriciated.  Symmetrical Chest wall movement, Good air movement  bilaterally, few bilateral crackles RRR,No Gallops, Rubs or new Murmurs, No Parasternal Heave +ve B.Sounds, Abd Soft, No tenderness, No organomegaly appriciated, No rebound - guarding or rigidity. No Cyanosis, Clubbing or edema,  R.Hip-Knee bruised   Data Review:    CBC Recent Labs  Lab 10/23/2018 0629 10/26/18 0300 10/27/18 0255 10/27/18 0832 10/28/18 0030  WBC 3.8* 2.7* 4.5  --  5.9  HGB 8.2* 8.7* 8.0* 9.5* 8.6*  HCT 27.0* 29.7* 27.0* 28.0* 29.2*  PLT 233 264 286  --  312  MCV 86.8 88.1 87.7  --  86.9  MCH 26.4 25.8* 26.0  --  25.6*  MCHC 30.4 29.3* 29.6*  --  29.5*  RDW 16.8* 16.7* 16.5*  --  16.4*  LYMPHSABS 1.0 0.7 0.6*  --  0.7  MONOABS 0.3 0.2 0.3  --  0.4  EOSABS 0.0 0.0 0.0  --  0.0  BASOSABS 0.0 0.0 0.0  --  0.0    Chemistries  Recent Labs  Lab 10/21/2018 0629 10/26/18 0300 10/27/18 0255 10/27/18 0832 10/28/18 0030  NA 130* 142 136 134* 137  K 3.9 3.7 4.1 4.0 3.8  CL 92* 95* 101  --  100  CO2 '27 27 28  ' --  28  GLUCOSE 90 169* 144*  --  117*  BUN '11 15 18  ' --  17  CREATININE 0.95 0.72 0.75  --  0.78  CALCIUM 7.9* 8.7* 8.1*  --  8.1*  MG  --  2.0 2.0  --  2.0  AST 25 30 33  --  32  ALT '11 14 15  ' --  15  ALKPHOS 94 89 78  --  80  BILITOT 0.5 0.3 0.2*  --  0.4   ------------------------------------------------------------------------------------------------------------------ No results for input(s): CHOL, HDL, LDLCALC, TRIG, CHOLHDL, LDLDIRECT in the last 72 hours.  Lab Results  Component Value Date   HGBA1C 5.9 (H) 04/10/2016   ------------------------------------------------------------------------------------------------------------------ No results for input(s): TSH, T4TOTAL, T3FREE, THYROIDAB in the last 72 hours.  Invalid input(s): FREET3  Cardiac Enzymes No results for input(s): CKMB, TROPONINI, MYOGLOBIN in the last 168 hours.  Invalid input(s): CK  ------------------------------------------------------------------------------------------------------------------    Component Value Date/Time   BNP 211.7 (H) 10/26/2018 0300   BNP 310.6 (H) 01/20/2016 1435    Micro Results Recent Results (from the past 240 hour(s))  Blood Culture (routine x 2)     Status: None (Preliminary result)   Collection Time: 10/13/2018  6:29 AM   Specimen: BLOOD  Result Value Ref Range Status   Specimen Description BLOOD LEFT ARM  Final   Special Requests   Final    BOTTLES DRAWN AEROBIC AND ANAEROBIC Blood Culture results may not be optimal due to an excessive volume of blood received in culture bottles   Culture   Final    NO GROWTH 2 DAYS Performed at [facility], [place] [address], [place], [place] [address]    Report Status PENDING  Incomplete  Blood Culture (routine x 2)     Status: None (Preliminary result)   Collection Time: 10/16/2018  7:29 AM   Specimen: BLOOD  Result Value Ref Range Status   Specimen Description BLOOD SITE NOT SPECIFIED  Final   Special Requests   Final    BOTTLES DRAWN AEROBIC AND ANAEROBIC Blood Culture adequate volume   Culture   Final    NO GROWTH 2 DAYS Performed at [facility], [address] [place], [place] [address]    Report Status PENDING  Incomplete    Radiology Reports Dg Chest Port 1 View  Result Date: 10/28/2018 CLINICAL DATA:  Acute respiratory failure with hypoxemia EXAM: PORTABLE CHEST 1 VIEW COMPARISON:  Two days ago FINDINGS: Marked diffuse worsening of bilateral airspace disease. Cardiomegaly and vascular pedicle widening. Three valves have been repaired previously. Right PICC with tip at the SVC. No pneumothorax. IMPRESSION: Extensive bilateral airspace disease with significant progression from 2 days ago. This could be worsening pneumonia or superimposed edema. Electronically Signed   By: Monte Fantasia M.D.   On: 10/28/2018 05:07   Dg Chest Port 1 View  Result Date: 10/28/2018 CLINICAL  DATA:  Cough for 3 days.  Positive COVID-19 EXAM: PORTABLE CHEST 1 VIEW COMPARISON:  12/20/2017 FINDINGS: Borderline heart size. Three valves have been replaced and there is left atrial clipping. Patchy bilateral lung opacity likely related to COVID-19 positivity. No effusion or pneumothorax. Lung volumes are low. IMPRESSION: Bilateral pneumonia in the setting of COVID-19. Electronically Signed   By: Monte Fantasia M.D.   On: 10/15/2018 06:35   Dg Chest Port 1v Same Day  Result Date: 10/26/2018 CLINICAL DATA:  Shortness of breath EXAM: PORTABLE CHEST 1 VIEW COMPARISON:  Yesterday FINDINGS: Improved lung volumes and mild decrease in opacity. There is still extensive patchy bilateral airspace disease in the setting of COVID-19. Densest opacity in the right upper lobe. Postoperative heart with replaced valves. No effusion or pneumothorax. IMPRESSION: Improved lung volumes and aeration, but still extensive pneumonia asymmetric to the right upper lobe. Electronically Signed   By: Monte Fantasia M.D.   On: 10/26/2018 08:45   [place] Ekg Site Rite  Result Date: 10/27/2018 If Site Rite image not attached, placement could not be confirmed due to current cardiac rhythm.

## 2018-10-28 NOTE — Progress Notes (Signed)
ANTICOAGULATION CONSULT NOTE - Follow-Up Consult  Pharmacy Consult for heparin Indication: atrial fibrillation  Allergies  Allergen Reactions  . Clarithromycin Nausea Only and Other (See Comments)    Dizzy, blurred vision, achy stomach Dizzy, blurred vision, stomach ache. unknown   . Trazodone Other (See Comments)    Weakness in legs Numbness in arms Funny feeling Rapid heart beat Loses focus. unknown   . Trazodone And Nefazodone Other (See Comments)    Weakness in legs Numbness in arms Funny feeling Rapid heart beat Lose focus  . Flecainide Nausea Only  . Naproxen Sodium Other (See Comments)    unknown  . Propafenone Hcl Nausea Only  . Sulfa Antibiotics Other (See Comments)    Blister on large toe  . Sulfamethoxazole Other (See Comments)    unknown  . Aleve [Naproxen] Nausea And Vomiting and Other (See Comments)    Stomach ache  . Doxycycline Nausea Only and Other (See Comments)    Stomach ache Stomach ache. unknown   . Durabac [Apap-Salicyl-Phenyltolox-Caff] Nausea And Vomiting  . Estradiol Nausea Only and Other (See Comments)    Hurt stomach unknown  . Ibuprofen Nausea Only and Other (See Comments)    Stomach ahce unknown  . Macrobid [Nitrofurantoin] Other (See Comments)    BLOATING  . Methocarbamol Nausea Only and Other (See Comments)    Hurt stomach unknown  . Oxaprozin Nausea Only and Other (See Comments)    Hurt stomach unknown  . Topiramate Nausea And Vomiting and Other (See Comments)    unknown  . Valdecoxib Nausea And Vomiting and Other (See Comments)    Stomach ache  unknown    Patient Measurements: Height: 5\' 7"  (170.2 cm) Weight: 190 lb 3.2 oz (86.3 kg) IBW/kg (Calculated) : 61.6 Heparin Dosing Weight: 79 kg   Vital Signs: Temp: 97.7 F (36.5 C) (06/29 1200) Temp Source: Oral (06/29 1200) BP: 119/56 (06/29 1700) Pulse Rate: 70 (06/29 1700)  Labs: Recent Labs    10/26/18 0300 10/27/18 0255 10/27/18 0832  10/27/18 2140  10/28/18 0030 10/28/18 0500 10/28/18 0530 10/28/18 0950 10/28/18 1630  HGB 8.7* 8.0* 9.5*  --   --  8.6*  --   --   --   --   HCT 29.7* 27.0* 28.0*  --   --  29.2*  --   --   --   --   PLT 264 286  --   --   --  312  --   --   --   --   APTT  --   --   --    < > 44*  --  59*  --  73* 85*  HEPARINUNFRC  --   --   --   --  1.28*  --   --  1.22*  --   --   CREATININE 0.72 0.75  --   --   --  0.78  --   --   --   --    < > = values in this interval not displayed.    Estimated Creatinine Clearance: 74.9 mL/min (by C-G formula based on SCr of 0.78 mg/dL).  Medications:  Scheduled Meds: . atorvastatin  20 mg Oral q1800  . benzonatate  200 mg Oral TID  . bisacodyl  10 mg Rectal Daily  . calcium-vitamin D  1 tablet Oral BID  . Chlorhexidine Gluconate Cloth  6 each Topical Daily  . famotidine  20 mg Oral BID  . ferrous sulfate  325  mg Oral Q breakfast  . fluticasone  2 spray Each Nare Daily  . gabapentin  600 mg Oral TID  . levothyroxine  137 mcg Oral QAC breakfast  . loratadine  10 mg Oral Daily  . methylPREDNISolone (SOLU-MEDROL) injection  40 mg Intravenous Q12H  . montelukast  10 mg Oral QPM  . [START ON 10/29/2018] pneumococcal 23 valent vaccine  0.5 mL Intramuscular Tomorrow-1000  . polyethylene glycol  17 g Oral Daily  . sodium chloride flush  10-40 mL Intracatheter Q12H   Continuous Infusions: . heparin 1,150 Units/hr (10/28/18 1700)  . remdesivir 100 mg in NS 250 mL Stopped (10/28/18 1559)     Assessment: 73 YOF on chronic apixaban for Afib presented with coughing and hemopstyis. Diagnosed with COVID. Pharmacy consulted to start IV heparin and d/c apixaban.  PTA Apixaban 5mg  BID.  LD on 6/25.  Today, 10/28/2018: APTT 85 is therapeutic on heparin 1150 units/hr. Heparin level elevated and not yet correlating d/t apixaban. Continue dosing by apTT levels until the two labs more closely correlate. CBC:  Hgb 8.6 remains low/stable, Plt WNL No bleeding or line issue per RN.   Minimal hemoptysis noted in sputum today, no worse than earlier report.  Goal of Therapy:  Heparin level 0.3-0.7 units/ml aPTT 66-102s seconds Monitor platelets by anticoagulation protocol: Yes   Plan:  -Continue heparin IV infusion at 1150 units/hr - Monitor daily HL, aPTT, WBC and s/s of bleeding   Peggyann Juba, PharmD, BCPS Pharmacy: [phone] Clinical pharmacist phone 7am- 5pm: [phone] 10/28/2018 6:04 PM

## 2018-10-28 NOTE — Progress Notes (Signed)
Spoke with patient's daughter, [name] and provided full update/answered all questions. Also facilitated Facetime visit between [place] and patient.

## 2018-10-28 NOTE — Progress Notes (Signed)
NAME:  Allison [name], MRN:  [id number], DOB:  01/18/1951, LOS: 3 ADMISSION DATE:  10/29/2018, CONSULTATION DATE:  10/27/2018 REFERRING MD:  Jerral RalphGhimire, CHIEF COMPLAINT:  Dyspnea   Brief History   68 year old female with hypertension and combined systolic/diastolic heart failure admitted [facility] on October 25, 2018 from a nursing home in the setting of acute respiratory failure with hypoxemia from COVID-19 pneumonia.  History of present illness   This is a 68 year old female who has multiple cardiovascular comorbid illnesses including valvular heart disease for which she takes Eliquis, atrial fibrillation who contracted COVID-19 pneumonia and was admitted to our facility for the same.  She had a fall early in June and suffered a leg fracture and underwent open reduction and internal fixation of her right distal femur at [facility].  She was transferred to [facility] rehab.  There she tested positive for COVID-19.  She [facility] to [facility] on June 26 with hemoptysis, subjective fever, malaise, some abdominal pain.  After admission she was treated with steroids, remdesivir, Actemra, and convalescent plasma.  She developed worsening hypoxemia with coughing spells and was transferred to the intensive care unit early in the morning on October 27, 2018 for closer observation.  This morning she says she feels a little short of breath but it is not too bad, perhaps a little better than yesterday.  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, mitral valve repair, aortic valve replacement Nonischemic cardiomyopathy Patent foramen ovale Permanent atrial fibrillation Hypothyroidism Hypertension Hyperlipidemia GERD Fibromyalgia Fatty liver   Significant Hospital Events   6/26 admission 6/28 transfer to ICU for increasing O2 needs  Consults:  PCCM  Procedures:    Significant Diagnostic Tests:  2018 transthoracic  echocardiogram showed bioprosthetic aortic and mitral valves, left atrium dilation, LVEF 50 to 55%  Micro Data:  6/24 SARS-COV2> positive 6/26 blood culture > NGTD  Antimicrobials:  6/26 Ceftriaxone > x1 6/26 Azithro > x1 6/26 Remdesivir >  6/26 Actemra x1 627 convalescent plasma   Interim history/subjective:   Feels about the same Oxygen level in 80's much of the night, will go to 90%  Objective   Blood pressure 123/66, pulse 60, temperature (!) 96.6 F (35.9 C), temperature source Axillary, resp. rate (!) 23, height 5\' 7"  (1.702 m), weight 86.3 kg, SpO2 100 %.    FiO2 (%):  [100 %] 100 %   Intake/Output Summary (Last 24 hours) at 10/28/2018 0754 Last data filed at 10/28/2018 0700 Gross per 24 hour  Intake 877.44 ml  Output 650 ml  Net 227.44 ml   Filed Weights   10/28/2018 1346 10/26/18 0500 10/28/18 0500  Weight: 84.1 kg 87.9 kg 86.3 kg    Examination:  General:  Resting comfortably in bed HENT: NCAT OP clear PULM: Crackles B, normal effort CV: RRR, no mgr GI: BS+, soft, nontender MSK: normal bulk and tone Neuro: awake, alert, no distress, MAEW   June 29 chest x-ray images independently reviewed showing worsening infiltrates (interstitial primarily) bilaterally  Resolved Hospital Problem list     Assessment & Plan:  Acute respiratory failure with hypoxemia due to COVID-19 pneumonia Hypoxemia currently worse than symptom of dyspnea Currently showing no signs of ventilatory failure Acute diastolic heart failure leading to acute pulmonary edema Continue remdesivir and solumedrol per protocol Maintain in ICU for close monitoring, need for intubation Tolerate periods of hypoxemia, goal at rest is greater than 85% SaO2, with movement ideally above 75%  Decision for intubation should be based on a change in mental status or physical evidence of ventilatory failure such as nasal flaring, accessory muscle use, paradoxical breathing Out of bed to chair as able Prone  positioning while in bed Diuresis with furosemide today  History of aortic/mitral valve replacement, diastolic heart failure. On Eliquis for atrial fibrillation Mild hemoptysis Diuresis Telemetry heparin infusion, monitor hemoptysis   Best practice:  Diet: regular diet Pain/Anxiety/Delirium protocol (if indicated): n/a VAP protocol (if indicated): n/a DVT prophylaxis: heparin infusion GI prophylaxis: n/a Glucose control: SSI Mobility: bed rest Code Status: full Family Communication: I called Joni Reining (daughter) for an update, she didn't answer so I left a details message Disposition: remain in ICU  Labs   CBC: Recent Labs  Lab 11/09/2018 0629 10/26/18 0300 10/27/18 0255 10/27/18 0832 10/28/18 0030  WBC 3.8* 2.7* 4.5  --  5.9  NEUTROABS 2.6 1.9 3.5  --  4.8  HGB 8.2* 8.7* 8.0* 9.5* 8.6*  HCT 27.0* 29.7* 27.0* 28.0* 29.2*  MCV 86.8 88.1 87.7  --  86.9  PLT 233 264 286  --  312    Basic Metabolic Panel: Recent Labs  Lab Nov 09, 2018 0629 10/26/18 0300 10/27/18 0255 10/27/18 0832 10/28/18 0030  NA 130* 142 136 134* 137  K 3.9 3.7 4.1 4.0 3.8  CL 92* 95* 101  --  100  CO2 27 27 28   --  28  GLUCOSE 90 169* 144*  --  117*  BUN 11 15 18   --  17  CREATININE 0.95 0.72 0.75  --  0.78  CALCIUM 7.9* 8.7* 8.1*  --  8.1*  MG  --  2.0 2.0  --  2.0   GFR: Estimated Creatinine Clearance: 74.9 mL/min (by C-G formula based on SCr of 0.78 mg/dL). Recent Labs  Lab 2018-11-09 0628 2018/11/09 0629 2018-11-09 1650 10/26/18 0300 10/27/18 0255 10/28/18 0030  PROCALCITON  --  0.47  --   --   --   --   WBC  --  3.8*  --  2.7* 4.5 5.9  LATICACIDVEN 0.9  --  1.0  --   --   --     Liver Function Tests: Recent Labs  Lab 11-09-18 0629 10/26/18 0300 10/27/18 0255 10/28/18 0030  AST 25 30 33 32  ALT 11 14 15 15   ALKPHOS 94 89 78 80  BILITOT 0.5 0.3 0.2* 0.4  PROT 5.6* 6.2* 5.3* 5.2*  ALBUMIN 2.6* 2.7* 2.4* 2.6*   No results for input(s): LIPASE, AMYLASE in the last 168 hours. No  results for input(s): AMMONIA in the last 168 hours.  ABG    Component Value Date/Time   PHART 7.456 (H) 10/27/2018 0832   PCO2ART 39.2 10/27/2018 0832   PO2ART 55.0 (L) 10/27/2018 0832   HCO3 27.6 10/27/2018 0832   TCO2 29 10/27/2018 0832   ACIDBASEDEF 6.0 (H) 04/14/2016 0531   O2SAT 90.0 10/27/2018 0832     Coagulation Profile: No results for input(s): INR, PROTIME in the last 168 hours.  Cardiac Enzymes: No results for input(s): CKTOTAL, CKMB, CKMBINDEX, TROPONINI in the last 168 hours.  HbA1C: Hgb A1c MFr Bld  Date/Time Value Ref Range Status  04/10/2016 04:26 PM 5.9 (H) 4.8 - 5.6 % Final    Comment:    (NOTE)         Pre-diabetes: 5.7 - 6.4         Diabetes: >6.4         Glycemic control for adults with diabetes: <  7.0     CBG: No results for input(s): GLUCAP in the last 168 hours.   Critical care time:31 minutes     Roselie Awkward, MD [place] [facility] Pager: [phone] Cell: [phone] If no response, call [phone]

## 2018-10-28 NOTE — Progress Notes (Signed)
ANTICOAGULATION CONSULT NOTE - [place] for heparin Indication: atrial fibrillation  Allergies  Allergen Reactions  . Clarithromycin Nausea Only and Other (See Comments)    Dizzy, blurred vision, achy stomach Dizzy, blurred vision, stomach ache. unknown   . Trazodone Other (See Comments)    Weakness in legs Numbness in arms Funny feeling Rapid heart beat Loses focus. unknown   . Trazodone And Nefazodone Other (See Comments)    Weakness in legs Numbness in arms Funny feeling Rapid heart beat Lose focus  . Flecainide Nausea Only  . Naproxen Sodium Other (See Comments)    unknown  . Propafenone Hcl Nausea Only  . Sulfa Antibiotics Other (See Comments)    Blister on large toe  . Sulfamethoxazole Other (See Comments)    unknown  . Aleve [Naproxen] Nausea And Vomiting and Other (See Comments)    Stomach ache  . Doxycycline Nausea Only and Other (See Comments)    Stomach ache Stomach ache. unknown   . Durabac [Apap-Salicyl-Phenyltolox-Caff] Nausea And Vomiting  . Estradiol Nausea Only and Other (See Comments)    Hurt stomach unknown  . Ibuprofen Nausea Only and Other (See Comments)    Stomach ahce unknown  . Macrobid [Nitrofurantoin] Other (See Comments)    BLOATING  . Methocarbamol Nausea Only and Other (See Comments)    Hurt stomach unknown  . Oxaprozin Nausea Only and Other (See Comments)    Hurt stomach unknown  . Topiramate Nausea And Vomiting and Other (See Comments)    unknown  . Valdecoxib Nausea And Vomiting and Other (See Comments)    Stomach ache  unknown    Patient Measurements: Height: 5\' 7"  (170.2 cm) Weight: 190 lb 3.2 oz (86.3 kg) IBW/kg (Calculated) : 61.6 Heparin Dosing Weight: 79 kg   Vital Signs: Temp: 96.6 F (35.9 C) (06/29 0731) Temp Source: Axillary (06/29 0731) BP: 123/66 (06/29 0700) Pulse Rate: 60 (06/29 0700)  Labs: Recent Labs    10/26/18 0300 10/27/18 0255 10/27/18 0832 10/27/18 2140  10/28/18 0030 10/28/18 0500 10/28/18 0530  HGB 8.7* 8.0* 9.5*  --  8.6*  --   --   HCT 29.7* 27.0* 28.0*  --  29.2*  --   --   PLT 264 286  --   --  312  --   --   APTT  --   --   --  44*  --  59*  --   HEPARINUNFRC  --   --   --  1.28*  --   --  1.22*  CREATININE 0.72 0.75  --   --  0.78  --   --     Estimated Creatinine Clearance: 74.9 mL/min (by C-G formula based on SCr of 0.78 mg/dL).  Medications:  Scheduled Meds: . atorvastatin  20 mg Oral q1800  . benzonatate  200 mg Oral TID  . bisacodyl  10 mg Rectal Daily  . calcium-vitamin D  1 tablet Oral BID  . Chlorhexidine Gluconate Cloth  6 each Topical Daily  . famotidine  20 mg Oral BID  . ferrous sulfate  325 mg Oral Q breakfast  . fluticasone  2 spray Each Nare Daily  . gabapentin  600 mg Oral TID  . Ipratropium-Albuterol  1 puff Inhalation QID  . levothyroxine  137 mcg Oral QAC breakfast  . loratadine  10 mg Oral Daily  . methylPREDNISolone (SOLU-MEDROL) injection  40 mg Intravenous Q12H  . montelukast  10 mg Oral QPM  . [START  ON 10/29/2018] pneumococcal 23 valent vaccine  0.5 mL Intramuscular Tomorrow-1000  . polyethylene glycol  17 g Oral Daily  . sodium chloride flush  10-40 mL Intracatheter Q12H   Continuous Infusions: . heparin 1,150 Units/hr (10/28/18 0900)  . remdesivir 100 mg in NS 250 mL Stopped (10/27/18 1615)     Assessment: 67 YOF on chronic apixaban for Afib presented with coughing and hemopstyis. Diagnosed with COVID. Pharmacy consulted to start IV heparin and d/c apixaban.  PTA Apixaban 5mg  BID.  LD on 6/25.  Today, 10/28/2018: APTT 73 is therapeutic on heparin 1150 units/hr. Heparin level elevated and not yet correlating d/t apixaban. Continue dosing by apTT levels until the two labs more closely correlate. CBC:  Hgb 8.6 remains low/stable, Plt WNL No bleeding or line issue per RN.  Minimal hemoptysis noted in sputum today.  Goal of Therapy:  Heparin level 0.3-0.7 units/ml aPTT 66-102s  seconds Monitor platelets by anticoagulation protocol: Yes   Plan:  -Continue heparin IV infusion at 1150 units/hr - APTT in 6 hours to confirm therapeutic level. - Monitor daily HL, aPTT, WBC and s/s of bleeding   Lynann Beaver PharmD, BCPS Clinical pharmacist phone 7am- 5pm: [phone] 10/28/2018 8:51 AM

## 2018-10-28 NOTE — Progress Notes (Signed)
PT Cancellation Note  Patient Details Name: Philomene Haff MRN: [id number] DOB: 08-Oct-1950   Cancelled Treatment:    Reason Eval/Treat Not Completed: Patient not medically ready , Placed on NRB this AM. Will check back  When medically ready for PT.   Claretha Cooper 10/28/2018, 7:26 AM [phone]

## 2018-10-28 NOTE — Progress Notes (Signed)
Pt requested RN to take off Twin d/t nose burning.  RN placed pt on NRB at this time.  Pt seems more comfortable and vitals are stable. RT will monitor.

## 2018-10-28 NOTE — Progress Notes (Signed)
ANTICOAGULATION CONSULT NOTE - Follow-Up Consult  Pharmacy Consult for heparin Indication: atrial fibrillation  Allergies  Allergen Reactions  . Clarithromycin Nausea Only and Other (See Comments)    Dizzy, blurred vision, achy stomach Dizzy, blurred vision, stomach ache. unknown   . Trazodone Other (See Comments)    Weakness in legs Numbness in arms Funny feeling Rapid heart beat Loses focus. unknown   . Trazodone And Nefazodone Other (See Comments)    Weakness in legs Numbness in arms Funny feeling Rapid heart beat Lose focus  . Flecainide Nausea Only  . Naproxen Sodium Other (See Comments)    unknown  . Propafenone Hcl Nausea Only  . Sulfa Antibiotics Other (See Comments)    Blister on large toe  . Sulfamethoxazole Other (See Comments)    unknown  . Aleve [Naproxen] Nausea And Vomiting and Other (See Comments)    Stomach ache  . Doxycycline Nausea Only and Other (See Comments)    Stomach ache Stomach ache. unknown   . Durabac [Apap-Salicyl-Phenyltolox-Caff] Nausea And Vomiting  . Estradiol Nausea Only and Other (See Comments)    Hurt stomach unknown  . Ibuprofen Nausea Only and Other (See Comments)    Stomach ahce unknown  . Macrobid [Nitrofurantoin] Other (See Comments)    BLOATING  . Methocarbamol Nausea Only and Other (See Comments)    Hurt stomach unknown  . Oxaprozin Nausea Only and Other (See Comments)    Hurt stomach unknown  . Topiramate Nausea And Vomiting and Other (See Comments)    unknown  . Valdecoxib Nausea And Vomiting and Other (See Comments)    Stomach ache  unknown    Patient Measurements: Height: 5\' 7"  (170.2 cm) Weight: 193 lb 12.6 oz (87.9 kg) IBW/kg (Calculated) : 61.6 Heparin Dosing Weight: 79 kg   Vital Signs: Temp: 98.6 F (37 C) (06/29 0000) Temp Source: Oral (06/29 0000) BP: 111/59 (06/29 0000) Pulse Rate: 71 (06/29 0000)  Labs: Recent Labs    10/14/2018 0629 10/26/18 0300 10/27/18 0255 10/27/18 0832  10/27/18 2140  HGB 8.2* 8.7* 8.0* 9.5*  --   HCT 27.0* 29.7* 27.0* 28.0*  --   PLT 233 264 286  --   --   APTT  --   --   --   --  44*  HEPARINUNFRC  --   --   --   --  1.28*  CREATININE 0.95 0.72 0.75  --   --     Estimated Creatinine Clearance: 75.5 mL/min (by C-G formula based on SCr of 0.75 mg/dL).   Medical History: Past Medical History:  Diagnosis Date  . AC (acromioclavicular) joint bone spurs    spurs in shoulders with pain  . Anxiety   . Aortic insufficiency   . Arthritis    all over  . Chronic combined systolic and diastolic CHF (congestive heart failure) (HCC)   . Constipation   . Coronary artery disease   . Deafness   . Dysrhythmia   . Fatty liver   . Fibromyalgia   . GERD (gastroesophageal reflux disease)   . Hyperlipidemia   . Hypertension   . Hypothyroidism   . IBS (irritable bowel syndrome)   . Mitral regurgitation   . Neuropathy   . Non-ischemic cardiomyopathy (HCC)    a. Normal cors 01/2016.  . Osteoarthritis of glenohumeral joint, left    Left shoulder  . Osteopenia   . Persistent atrial fibrillation   . PFO (patent foramen ovale)    a. closure 03/2016.  Marland Kitchen  Pre-diabetes   . S/P aortic valve replacement with bioprosthetic valve 04/13/2016   23 mm [facility] Ease bovine pericardial tissue valve  . S/P mitral valve repair 04/13/2016   26 mm Sorin Memo 3D ring annuloplasty  . 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 Tricuspid regurgitation     Medications:  Medications Prior to Admission  Medication Sig Dispense Refill Last Dose  . acetaminophen (TYLENOL) 325 MG tablet Take 650 mg by mouth every 6 (six) hours as needed.   10/23/2018 at prn  . apixaban (ELIQUIS) 5 MG TABS tablet Take 1 tablet (5 mg total) by mouth 2 (two) times daily. 60 tablet 6 10/24/2018 at Unknown time  . atorvastatin (LIPITOR) 20 MG tablet Take 20 mg by mouth at bedtime.    10/24/2018 at Unknown time  . bisacodyl (DULCOLAX) 10 MG  suppository Place 10 mg rectally as needed for moderate constipation.   unknown at prn  . bisacodyl (DULCOLAX) 5 MG EC tablet Give 1 tablet by mouth daily as needed for constipation   unknown at prn  . Calcium 600-200 MG-UNIT tablet Take 1 tablet by mouth 2 (two) times daily.   10/24/2018 at Unknown time  . calcium carbonate (TUMS - DOSED IN MG ELEMENTAL CALCIUM) 500 MG chewable tablet Chew 1 tablet by mouth 2 (two) times daily.   10/24/2018 at Unknown time  . carvedilol (COREG) 12.5 MG tablet TAKE 1 TABLET BY MOUTH TWO  TIMES DAILY WITH MEALS (Patient taking differently: Take 12.5 mg by mouth 2 (two) times daily with a meal. ) 180 tablet 3 10/24/2018 at 12pm  . cetirizine (ZYRTEC) 10 MG tablet Take 10 mg by mouth daily.   10/24/2018 at Unknown time  . diclofenac (VOLTAREN) 75 MG EC tablet Give 1 tablet by mouth daily as needed for pain   10/21/2018 at prn  . diphenhydrAMINE (BENADRYL) 25 mg capsule Take 25 mg by mouth daily as needed for itching.    unknown at prn  . famotidine (PEPCID) 40 MG tablet Take 40 mg by mouth at bedtime. nightly for GERD   10/24/2018 at Unknown time  . ferrous sulfate 325 (65 FE) MG EC tablet Take 325 mg by mouth daily with breakfast.    10/24/2018 at Unknown time  . fluticasone (VERAMYST) 27.5 MCG/SPRAY nasal spray Place 2 sprays into the nose daily.   10/24/2018 at Unknown time  . gabapentin (NEURONTIN) 600 MG tablet Take 600 mg by mouth 3 (three) times daily.    10/24/2018 at Unknown time  . HYDROcodone-acetaminophen (NORCO/VICODIN) 5-325 MG tablet Take 1 tablet by mouth every 8 (eight) hours.    11-12-2018 at Unknown time  . levothyroxine (SYNTHROID, LEVOTHROID) 137 MCG tablet Take 137 mcg by mouth daily before breakfast.    10/24/2018 at 0630  . magnesium hydroxide (MILK OF MAGNESIA) 400 MG/5ML suspension Take 30 mLs by mouth daily as needed for mild constipation.   unknown at prn  . montelukast (SINGULAIR) 10 MG tablet Take 10 mg by mouth every evening.    10/24/2018 at Unknown  time  . omeprazole (PRILOSEC) 40 MG capsule Take 40 mg by mouth daily.    10/24/2018 at Unknown time  . ondansetron (ZOFRAN) 4 MG tablet Take 4 mg by mouth every 6 (six) hours as needed.   10/23/2018 at prn  . polyethylene glycol (MIRALAX / GLYCOLAX) 17 g packet Mix 17 gm in 4 oz of water and give by mouth daily as needed  for constipation   unknown at prn  . Sodium Phosphates (RA SALINE ENEMA RE) Place 1 application rectally as needed.   unknown at prn  . tiZANidine (ZANAFLEX) 2 MG tablet Take 2 tablets (4 mg total) by mouth every 6 (six) hours as needed for muscle spasms. 40 tablet 0 10/20/2018 at prn  . tiZANidine (ZANAFLEX) 4 MG tablet Take 4 mg by mouth every 8 (eight) hours.   2018-09-27 at Unknown time    Assessment: 67 YOF on chronic apixaban for Afib presented with coughing and hemopstyis. Diagnosed with COVID. Pharmacy consulted to start IV heparin this afternoon and d/c apixaban. H/H improved today. Plt wnl. SCr has improved to 1.7.   PM update:  - Initial aPTT is 44, HL 1.28, not yet correlating. Continue dosing by apTT levels until the two labs more closely correlate. - No bleeding or line issue per RN   Goal of Therapy:  Heparin level 0.3-0.7 units/ml aPTT 66-102s seconds Monitor platelets by anticoagulation protocol: Yes   Plan:  -Increase IV heparin to 1150 units/hr.   -F/u 6 hr aPTT/HL -Monitor daily HL, aPTT, WBC and s/s of bleeding   Adalberto ColeNikola Irmalee Riemenschneider, PharmD, BCPS 10/28/2018 1:59 AM

## 2018-10-28 NOTE — Progress Notes (Signed)
OT Cancellation Note  Patient Details Name: Tieisha Darden MRN: [id number] DOB: [birth year]   Cancelled Treatment:    Reason Eval/Treat Not Completed: Medical issues which prohibited therapy. Placed on NRB this AM. Will check back  When medically ready for OT.  Merri Ray Mikayla Chiusano 10/28/2018, 8:09 AM   Hulda Humphrey OTR/L Acute Rehabilitation Services Pager: [phone] Office: [phone]

## 2018-10-28 NOTE — Progress Notes (Signed)
Patient O2 sats in the 70's when asleep due to patient breathing out her mouth. Patient reluctant to be placed on NRB mask, refuses to lay in prone position. Explanation given by RN and RT via written communication. Patient eventually agrees to wear NRB. O2 now in 90's

## 2018-10-29 LAB — CBC WITH DIFFERENTIAL/PLATELET
Abs Immature Granulocytes: 0.08 10*3/uL — ABNORMAL HIGH (ref 0.00–0.07)
Basophils Absolute: 0 10*3/uL (ref 0.0–0.1)
Basophils Relative: 0 %
Eosinophils Absolute: 0 10*3/uL (ref 0.0–0.5)
Eosinophils Relative: 0 %
HCT: 29.6 % — ABNORMAL LOW (ref 36.0–46.0)
Hemoglobin: 9 g/dL — ABNORMAL LOW (ref 12.0–15.0)
Immature Granulocytes: 1 %
Lymphocytes Relative: 12 %
Lymphs Abs: 0.8 10*3/uL (ref 0.7–4.0)
MCH: 26.5 pg (ref 26.0–34.0)
MCHC: 30.4 g/dL (ref 30.0–36.0)
MCV: 87.3 fL (ref 80.0–100.0)
Monocytes Absolute: 0.5 10*3/uL (ref 0.1–1.0)
Monocytes Relative: 7 %
Neutro Abs: 5.6 10*3/uL (ref 1.7–7.7)
Neutrophils Relative %: 80 %
Platelets: 326 10*3/uL (ref 150–400)
RBC: 3.39 MIL/uL — ABNORMAL LOW (ref 3.87–5.11)
RDW: 16.9 % — ABNORMAL HIGH (ref 11.5–15.5)
WBC: 6.9 10*3/uL (ref 4.0–10.5)
nRBC: 0 % (ref 0.0–0.2)

## 2018-10-29 LAB — POCT I-STAT 7, (LYTES, BLD GAS, ICA,H+H)
Acid-Base Excess: 7 mmol/L — ABNORMAL HIGH (ref 0.0–2.0)
Bicarbonate: 31.1 mmol/L — ABNORMAL HIGH (ref 20.0–28.0)
Calcium, Ion: 1.16 mmol/L (ref 1.15–1.40)
HCT: 28 % — ABNORMAL LOW (ref 36.0–46.0)
Hemoglobin: 9.5 g/dL — ABNORMAL LOW (ref 12.0–15.0)
O2 Saturation: 86 %
Patient temperature: 98
Potassium: 3.4 mmol/L — ABNORMAL LOW (ref 3.5–5.1)
Sodium: 137 mmol/L (ref 135–145)
TCO2: 32 mmol/L (ref 22–32)
pCO2 arterial: 38.5 mmHg (ref 32.0–48.0)
pH, Arterial: 7.514 — ABNORMAL HIGH (ref 7.350–7.450)
pO2, Arterial: 46 mmHg — ABNORMAL LOW (ref 83.0–108.0)

## 2018-10-29 LAB — COMPREHENSIVE METABOLIC PANEL
ALT: 15 U/L (ref 0–44)
AST: 27 U/L (ref 15–41)
Albumin: 2.6 g/dL — ABNORMAL LOW (ref 3.5–5.0)
Alkaline Phosphatase: 87 U/L (ref 38–126)
Anion gap: 12 (ref 5–15)
BUN: 18 mg/dL (ref 8–23)
CO2: 28 mmol/L (ref 22–32)
Calcium: 8.2 mg/dL — ABNORMAL LOW (ref 8.9–10.3)
Chloride: 98 mmol/L (ref 98–111)
Creatinine, Ser: 0.72 mg/dL (ref 0.44–1.00)
GFR calc Af Amer: >60 mL/min (ref >60)
GFR calc non Af Amer: >60 mL/min (ref >60)
Glucose, Bld: 113 mg/dL — ABNORMAL HIGH (ref 70–99)
Potassium: 3.3 mmol/L — ABNORMAL LOW (ref 3.5–5.1)
Sodium: 138 mmol/L (ref 135–145)
Total Bilirubin: 0.3 mg/dL (ref 0.3–1.2)
Total Protein: 5.3 g/dL — ABNORMAL LOW (ref 6.5–8.1)

## 2018-10-29 LAB — APTT: aPTT: >200 seconds (ref 24–36)

## 2018-10-29 LAB — FERRITIN: Ferritin: 350 ng/mL — ABNORMAL HIGH (ref 11–307)

## 2018-10-29 LAB — HEPARIN LEVEL (UNFRACTIONATED): Heparin Unfractionated: >2.2 IU/mL — ABNORMAL HIGH (ref 0.30–0.70)

## 2018-10-29 LAB — MAGNESIUM: Magnesium: 2 mg/dL (ref 1.7–2.4)

## 2018-10-29 LAB — D-DIMER, QUANTITATIVE: D-Dimer, Quant: 1.99 ug/mL-FEU — ABNORMAL HIGH (ref 0.00–0.50)

## 2018-10-29 LAB — C-REACTIVE PROTEIN: CRP: 2.9 mg/dL — ABNORMAL HIGH (ref <1.0)

## 2018-10-29 MED ORDER — APIXABAN 5 MG PO TABS
5.0000 mg | ORAL_TABLET | Freq: Two times a day (BID) | ORAL | Status: DC
  Administered 2018-10-29 – 2018-11-02 (×10): 5 mg via ORAL
  Filled 2018-10-29 (×11): qty 1

## 2018-10-29 MED ORDER — LORAZEPAM 0.5 MG PO TABS
1.0000 mg | ORAL_TABLET | Freq: Two times a day (BID) | ORAL | Status: DC | PRN
  Administered 2018-10-29 – 2018-11-02 (×6): 1 mg via ORAL
  Administered 2018-11-02: 0.5 mg via ORAL
  Administered 2018-11-03 – 2018-11-04 (×4): 1 mg via ORAL
  Filled 2018-10-29 (×11): qty 2

## 2018-10-29 MED ORDER — POTASSIUM CHLORIDE CRYS ER 20 MEQ PO TBCR
40.0000 meq | EXTENDED_RELEASE_TABLET | Freq: Once | ORAL | Status: AC
  Administered 2018-10-29: 40 meq via ORAL
  Filled 2018-10-29: qty 2

## 2018-10-29 MED ORDER — LORAZEPAM 0.5 MG PO TABS
1.0000 mg | ORAL_TABLET | Freq: Every evening | ORAL | Status: DC | PRN
  Administered 2018-10-29: 1 mg via ORAL
  Filled 2018-10-29: qty 2

## 2018-10-29 MED ORDER — FUROSEMIDE 10 MG/ML IJ SOLN
40.0000 mg | Freq: Once | INTRAMUSCULAR | Status: AC
  Administered 2018-10-29: 40 mg via INTRAVENOUS
  Filled 2018-10-29: qty 4

## 2018-10-29 NOTE — Progress Notes (Signed)
PROGRESS NOTE                                                                                                                                                                                                             Patient Demographics:    Allison [name], is a 68 y.o. female, DOB - 10/09/1950, ZOX:[id number]  Outpatient Primary MD for the patient is Wenda Low, MD    LOS - 4  Admit date - 10/13/2018    Chief Complaint  Patient presents with   Shortness of Breath       Brief Narrative  Patient is a 68 y.o. female with PMHx of chronic systolic and diastolic heart failure, atrial fibrillation on Eliquis, s/p bioprosthetic aortic valve replacement, s/p mitral and tricuspid valve repair, closure of patent foreman ovale with maze procedure on 04/13/2016-underwent ORIF for right distal femur fracture at [facility] on 6/7- following which she was discharged to SNF.  She presented to the hospital on 6/26 with approximately 5-6-day history of cough along with minimal hemoptysis, subjective fever, malaise and worsening shortness of breath.  She was subsequently diagnosed with COVID-19-and found to have acute hypoxic respiratory failure secondary to COVID-19 pneumonia.  She was then admitted to the hospitalist service at [facility].   Subjective:   Patient in bed, appears comfortable, says shortness of breath has improved, denies any chest abdominal pain.   Assessment  & Plan :     1. Acute Hypoxic Resp. Failure due to Acute Covid 19 Viral Pneumonitis during the ongoing 2020 Covid 19 Pandemic -  Has received maximal treatment.  6/25>> Solu-Medrol 6/25>> Remdesivir 6/25>> Actemra 6/26 >> Plasma  Continues to be severely hypoxic on high flow nasal cannula oxygen and ICU, overall does not appear to be in any distress, continue monitoring with supportive care, refuses to get prone.  Will encourage to  sit up in chair and use flutter valve for pulmonary toiletry.  PCCM on board.  For now continue IV steroids.  FiO2 (%):  [100 %] 100 %  ABG     Component Value Date/Time   PHART 7.514 (H) 10/29/2018 0306   PCO2ART 38.5 10/29/2018 0306   PO2ART 46.0 (L) 10/29/2018 0306   HCO3 31.1 (H) 10/29/2018 0306   TCO2 32 10/29/2018 0306   ACIDBASEDEF 6.0 (H) 04/14/2016 0531  O2SAT 86.0 10/29/2018 0306    COVID-19 Labs  Recent Labs    10/27/18 0255 10/28/18 0030 10/28/18 0500 10/29/18 0450  DDIMER 1.70*  --  1.55* 1.99*  FERRITIN 511* 414*  --  350*  CRP 8.1* 5.1*  --   --     No results found for: SARSCOV2NAA   Hepatic Function Latest Ref Rng & Units 10/29/2018 10/28/2018 10/27/2018  Total Protein 6.5 - 8.1 g/dL 5.3(L) 5.2(L) 5.3(L)  Albumin 3.5 - 5.0 g/dL 2.6(L) 2.6(L) 2.4(L)  AST 15 - 41 U/L 27 32 33  ALT 0 - 44 U/L '15 15 15  ' Alk Phosphatase 38 - 126 U/L 87 80 78  Total Bilirubin 0.3 - 1.2 mg/dL 0.3 0.4 0.2(L)        Component Value Date/Time   BNP 211.7 (H) 10/26/2018 0300   BNP 310.6 (H) 01/20/2016 1435      2.  Persistent cough related mild bronchial injury causing sputum laced with few streaks of blood - do think this is true hemoptysis this was mild bronchial irritation from persistent cough, this has resolved, resume Eliquis and stop heparin drip.  3.  History of paroxysmal atrial fibrillation status post maze procedure in the past.  Mali vas 2 score will be at least 4.  Resume Eliquis currently in sinus.  4.  Mild acute on chronic diastolic CHF.  EF 55% on last echocardiogram in January 2018.  Proving with diuresis, continue IV Lasix and monitor need daily.  5.  Acute closed displaced periprosthetic fracture of the distal femur involving the right knee replacement-status post ORIF 10/06/18; -continue nonweightbearing in the R.Leg, continue right knee brace, PT and SNF upon DC.  6.  Essential hypertension.  Stable off medications.  7.  History of bioprosthetic  aortic valve replacement, mitral valve repair, tricuspid valve repair.  History of PFO closure.  Supportive care.  8.  Hypothyroidism.  On Synthroid.  9.  Deafness.  Supportive care.  10. Underlying history of asthma.  Stable no wheezing.  11. GERD.  On PPI.    Condition - Extremely Guarded  Family Communication  :  None  Code Status :  Full  Diet :   Diet Order            Diet Heart Room service appropriate? Yes; Fluid consistency: Thin  Diet effective now               Disposition Plan  :  ICU  Consults  :  PCCM  Procedures  :    PICC - 10/27/18  PUD Prophylaxis : Famotidine  DVT Prophylaxis  :  Eliquis/ Heparin gtt  Lab Results  Component Value Date   PLT 326 10/29/2018    Inpatient Medications  Scheduled Meds:  apixaban  5 mg Oral BID   atorvastatin  20 mg Oral q1800   benzonatate  200 mg Oral TID   bisacodyl  10 mg Rectal Daily   calcium-vitamin D  1 tablet Oral BID   Chlorhexidine Gluconate Cloth  6 each Topical Daily   famotidine  20 mg Oral BID   ferrous sulfate  325 mg Oral Q breakfast   fluticasone  2 spray Each Nare Daily   gabapentin  600 mg Oral TID   levothyroxine  137 mcg Oral QAC breakfast   loratadine  10 mg Oral Daily   methylPREDNISolone (SOLU-MEDROL) injection  40 mg Intravenous Q12H   montelukast  10 mg Oral QPM   pneumococcal 23 valent vaccine  0.5 mL Intramuscular Tomorrow-1000   polyethylene glycol  17 g Oral Daily   potassium chloride  40 mEq Oral Once   Continuous Infusions:  remdesivir 100 mg in NS 250 mL Stopped (10/28/18 1559)   PRN Meds:.acetaminophen, bisacodyl, chlorpheniramine-HYDROcodone, diphenhydrAMINE, guaiFENesin-dextromethorphan, HYDROcodone-acetaminophen, Ipratropium-Albuterol, LORazepam, menthol-cetylpyridinium, [DISCONTINUED] ondansetron **OR** ondansetron (ZOFRAN) IV, simethicone, sodium chloride, sodium chloride flush, tiZANidine  Antibiotics  :    Anti-infectives (From admission,  onward)   Start     Dose/Rate Route Frequency Ordered Stop   10/26/18 1600  remdesivir 100 mg in sodium chloride 0.9 % 250 mL IVPB     100 mg 500 mL/hr over 30 Minutes Intravenous Every 24 hours 09/30/2018 1440 10/30/18 1559   10/29/2018 1600  remdesivir 200 mg in sodium chloride 0.9 % 250 mL IVPB     200 mg 500 mL/hr over 30 Minutes Intravenous Once 10/09/2018 1440 10/09/2018 1757   10/23/2018 0830  cefTRIAXone (ROCEPHIN) 2 g in sodium chloride 0.9 % 100 mL IVPB  Status:  Discontinued     2 g 200 mL/hr over 30 Minutes Intravenous Every 24 hours 10/10/2018 0807 10/15/2018 1509   10/11/2018 0830  azithromycin (ZITHROMAX) 500 mg in sodium chloride 0.9 % 250 mL IVPB  Status:  Discontinued     500 mg 250 mL/hr over 60 Minutes Intravenous Every 24 hours 10/03/2018 0807 10/05/2018 1509       Time Spent in minutes  30   Lala Lund M.D on 10/29/2018 at 9:39 AM  To page go to [email or web address] - password [facility]  [facility] -  Office  [phone]    See all Orders from today for further details    Objective:   Vitals:   10/29/18 0748 10/29/18 0800 10/29/18 0812 10/29/18 0900  BP:  126/60 126/60 122/65  Pulse: 86 66 73 79  Resp: (!) 23 (!) 27 (!) 24 (!) 27  Temp: (!) 97 F (36.1 C)     TempSrc: Axillary     SpO2: 95% 92% 90% (!) 87%  Weight:      Height:        Wt Readings from Last 3 Encounters:  10/29/18 85.6 kg  03/19/18 74.8 kg  01/02/18 78.5 kg     Intake/Output Summary (Last 24 hours) at 10/29/2018 0939 Last data filed at 10/29/2018 0900 Gross per 24 hour  Intake 865.26 ml  Output 1700 ml  Net -834.74 ml     Physical Exam  Awake Alert, No new F.N deficits,  [place].AT,PERRAL Supple Neck,No JVD, No cervical lymphadenopathy appriciated.  Symmetrical Chest wall movement, Good air movement bilaterally, CTAB RRR,No Gallops, Rubs or new Murmurs, No Parasternal Heave +ve B.Sounds, Abd Soft, No tenderness, No organomegaly appriciated, No rebound - guarding or rigidity. No Cyanosis,  Clubbing or edema,  R.Hip-Knee bruised   Data Review:    CBC Recent Labs  Lab 10/22/2018 0629 10/26/18 0300 10/27/18 0255 10/27/18 0832 10/28/18 0030 10/29/18 0306 10/29/18 0450  WBC 3.8* 2.7* 4.5  --  5.9  --  6.9  HGB 8.2* 8.7* 8.0* 9.5* 8.6* 9.5* 9.0*  HCT 27.0* 29.7* 27.0* 28.0* 29.2* 28.0* 29.6*  PLT 233 264 286  --  312  --  326  MCV 86.8 88.1 87.7  --  86.9  --  87.3  MCH 26.4 25.8* 26.0  --  25.6*  --  26.5  MCHC 30.4 29.3* 29.6*  --  29.5*  --  30.4  RDW 16.8* 16.7* 16.5*  --  16.4*  --  16.9*  LYMPHSABS 1.0 0.7 0.6*  --  0.7  --  0.8  MONOABS 0.3 0.2 0.3  --  0.4  --  0.5  EOSABS 0.0 0.0 0.0  --  0.0  --  0.0  BASOSABS 0.0 0.0 0.0  --  0.0  --  0.0    Chemistries  Recent Labs  Lab 10/10/2018 0629 10/26/18 0300 10/27/18 0255 10/27/18 0832 10/28/18 0030 10/29/18 0306 10/29/18 0450  NA 130* 142 136 134* 137 137 138  K 3.9 3.7 4.1 4.0 3.8 3.4* 3.3*  CL 92* 95* 101  --  100  --  98  CO2 '27 27 28  ' --  28  --  28  GLUCOSE 90 169* 144*  --  117*  --  113*  BUN '11 15 18  ' --  17  --  18  CREATININE 0.95 0.72 0.75  --  0.78  --  0.72  CALCIUM 7.9* 8.7* 8.1*  --  8.1*  --  8.2*  MG  --  2.0 2.0  --  2.0  --  2.0  AST 25 30 33  --  32  --  27  ALT '11 14 15  ' --  15  --  15  ALKPHOS 94 89 78  --  80  --  87  BILITOT 0.5 0.3 0.2*  --  0.4  --  0.3   ------------------------------------------------------------------------------------------------------------------ No results for input(s): CHOL, HDL, LDLCALC, TRIG, CHOLHDL, LDLDIRECT in the last 72 hours.  Lab Results  Component Value Date   HGBA1C 5.9 (H) 04/10/2016   ------------------------------------------------------------------------------------------------------------------ No results for input(s): TSH, T4TOTAL, T3FREE, THYROIDAB in the last 72 hours.  Invalid input(s): FREET3  Cardiac Enzymes No results for input(s): CKMB, TROPONINI, MYOGLOBIN in the last 168 hours.  Invalid input(s):  CK ------------------------------------------------------------------------------------------------------------------    Component Value Date/Time   BNP 211.7 (H) 10/26/2018 0300   BNP 310.6 (H) 01/20/2016 1435    Micro Results Recent Results (from the past 240 hour(s))  Blood Culture (routine x 2)     Status: None (Preliminary result)   Collection Time: 10/03/2018  6:29 AM   Specimen: BLOOD  Result Value Ref Range Status   Specimen Description BLOOD LEFT ARM  Final   Special Requests   Final    BOTTLES DRAWN AEROBIC AND ANAEROBIC Blood Culture results may not be optimal due to an excessive volume of blood received in culture bottles   Culture   Final    NO GROWTH 4 DAYS Performed at [facility], [place] [address] [place], [place] [address]    Report Status PENDING  Incomplete  Blood Culture (routine x 2)     Status: None (Preliminary result)   Collection Time: 10/24/2018  7:29 AM   Specimen: BLOOD  Result Value Ref Range Status   Specimen Description BLOOD SITE NOT SPECIFIED  Final   Special Requests   Final    BOTTLES DRAWN AEROBIC AND ANAEROBIC Blood Culture adequate volume   Culture   Final    NO GROWTH 4 DAYS Performed at [facility], [address], [place], [place] [address]    Report Status PENDING  Incomplete  MRSA PCR Screening     Status: None   Collection Time: 10/28/18 11:00 AM   Specimen: Nasal Mucosa; Nasopharyngeal  Result Value Ref Range Status   MRSA by PCR NEGATIVE NEGATIVE Final    Comment:        The GeneXpert MRSA Assay (FDA approved for NASAL  specimens only), is one component of a comprehensive MRSA colonization surveillance program. It is not intended to diagnose MRSA infection nor to guide or monitor treatment for MRSA infections. Performed at [facility], [place] [address] [place], [place] [address]     Radiology Reports Dg Chest Port 1 View  Result Date: 10/28/2018 CLINICAL DATA:  Acute respiratory failure  with hypoxemia EXAM: PORTABLE CHEST 1 VIEW COMPARISON:  Two days ago FINDINGS: Marked diffuse worsening of bilateral airspace disease. Cardiomegaly and vascular pedicle widening. Three valves have been repaired previously. Right PICC with tip at the SVC. No pneumothorax. IMPRESSION: Extensive bilateral airspace disease with significant progression from 2 days ago. This could be worsening pneumonia or superimposed edema. Electronically Signed   By: Monte Fantasia M.D.   On: 10/28/2018 05:07   Dg Chest Port 1 View  Result Date: 10/27/2018 CLINICAL DATA:  Cough for 3 days.  Positive COVID-19 EXAM: PORTABLE CHEST 1 VIEW COMPARISON:  12/20/2017 FINDINGS: Borderline heart size. Three valves have been replaced and there is left atrial clipping. Patchy bilateral lung opacity likely related to COVID-19 positivity. No effusion or pneumothorax. Lung volumes are low. IMPRESSION: Bilateral pneumonia in the setting of COVID-19. Electronically Signed   By: [place] Fantasia M.D.   On: 10/10/2018 06:35   Dg Chest Port 1v Same Day  Result Date: 10/26/2018 CLINICAL DATA:  Shortness of breath EXAM: PORTABLE CHEST 1 VIEW COMPARISON:  Yesterday FINDINGS: Improved lung volumes and mild decrease in opacity. There is still extensive patchy bilateral airspace disease in the setting of COVID-19. Densest opacity in the right upper lobe. Postoperative heart with replaced valves. No effusion or pneumothorax. IMPRESSION: Improved lung volumes and aeration, but still extensive pneumonia asymmetric to the right upper lobe. Electronically Signed   By: [place] Fantasia M.D.   On: 10/26/2018 08:45   [place] Ekg Site Rite  Result Date: 10/27/2018 If Site Rite image not attached, placement could not be confirmed due to current cardiac rhythm.

## 2018-10-29 NOTE — Progress Notes (Signed)
Pt desat into the low 80's and stayed there consistently. Dr. Arville Go aware and ABG obtained. Dr stated would not intubate at this time based off ABG. Pt on HFNC at 75 and NRB. Will continue to monitor closely.

## 2018-10-29 NOTE — Progress Notes (Signed)
NAME:  Allison [name], MRN:  [id number], DOB:  December 29, 1950, LOS: 4 ADMISSION DATE:  11-01-18, CONSULTATION DATE:  10/27/2018 REFERRING MD:  Sloan Leiter, CHIEF COMPLAINT:  Dyspnea   Brief History   68 year old female with hypertension and combined systolic/diastolic heart failure admitted to [facility] on 01-Nov-2018 from a nursing home in the setting of acute respiratory failure with hypoxemia from COVID-19 pneumonia.  History of present illness   This is a 68 year old female who has multiple cardiovascular comorbid illnesses including valvular heart disease for which she takes Eliquis, atrial fibrillation who contracted COVID-19 pneumonia and was admitted to our facility for the same.  She had a fall early in June and suffered a leg fracture and underwent open reduction and internal fixation of her right distal femur at [facility].  She was transferred to [facility].  There she tested positive for COVID-19.  She presented to [facility] on June 26 with hemoptysis, subjective fever, malaise, some abdominal pain.  After admission she was treated with steroids, remdesivir, Actemra, and convalescent plasma.  She developed worsening hypoxemia with coughing spells and was transferred to the intensive care unit early in the morning on October 27, 2018 for closer observation.  This morning she says she feels a little short of breath but it is not too bad, perhaps a little better than yesterday.  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, mitral valve repair, aortic valve replacement Nonischemic cardiomyopathy Patent foramen ovale Permanent atrial fibrillation Hypothyroidism Hypertension Hyperlipidemia GERD Fibromyalgia Fatty liver   Significant Hospital Events   6/26 admission 6/28 transfer to ICU for increasing O2 needs  Consults:  PCCM  Procedures:    Significant Diagnostic Tests:  2018 transthoracic  echocardiogram showed bioprosthetic aortic and mitral valves, left atrium dilation, LVEF 50 to 55%  Micro Data:  6/24 SARS-COV2> positive 6/26 blood culture > NGTD  Antimicrobials:  6/26 Ceftriaxone > x1 6/26 Azithro > x1 6/26 Remdesivir >  6/26 Actemra x1 627 convalescent plasma   Interim history/subjective:   Feels about the same today Last night had some hypoxemia  Objective   Blood pressure 116/67, pulse 72, temperature (!) 97 F (36.1 C), temperature source Axillary, resp. rate (!) 32, height 5\' 7"  (1.702 m), weight 85.6 kg, SpO2 (!) 81 %.    FiO2 (%):  [100 %] 100 %   Intake/Output Summary (Last 24 hours) at 10/29/2018 1323 Last data filed at 10/29/2018 0900 Gross per 24 hour  Intake 709.81 ml  Output 800 ml  Net -90.19 ml   Filed Weights   10/26/18 0500 10/28/18 0500 10/29/18 0500  Weight: 87.9 kg 86.3 kg 85.6 kg    Examination:  General:  Resting comfortably in bed HENT: NCAT OP clear PULM: CTA B, normal effort CV: RRR, no mgr GI: BS+, soft, nontender MSK: normal bulk and tone Neuro: awake, alert, no distress, MAEW    June 29 chest x-ray images independently reviewed showing worsening infiltrates (interstitial primarily) bilaterally  Resolved Hospital Problem list     Assessment & Plan:  Acute respiratory failure with hypoxemia due to COVID-19 pneumonia Hypoxemia currently worse than symptom of dyspnea Currently showing no signs of ventilatory failure Acute diastolic heart failure leading to acute pulmonary edema Solumedrol, remdesivir per protocol Continue diuresis Maintain in ICU Tolerate periods of hypoxemia, goal at rest is greater than 85% SaO2, with movement ideally above 75% Decision for intubation should be based on a change in  mental status or physical evidence of ventilatory failure such as nasal flaring, accessory muscle use, paradoxical breathing Out of bed to chair as able Prone positioning while in bed Continue to administer O2 via  heated high flow  History of aortic/mitral valve replacement, diastolic heart failure. On Eliquis for atrial fibrillation Mild hemoptysis > resolved Diuresis Telemetry Change heparin to eliquis again today  Goals of care: I had a lengthy conversation with the patient and her daughter who is translating via face time today regarding CODE STATUS.  I explained that she has a severe critical illness and I still think there is a chance for recovery but if she were to deteriorate to the point that she would require intubation and mechanical ventilation that I think she would do quite poorly.  I explained this to her at length today.  After conversation she indicated that she did not want to go on life support. Code status changed to DNR.  Best practice:  Diet: regular diet Pain/Anxiety/Delirium protocol (if indicated): n/a VAP protocol (if indicated): n/a DVT prophylaxis: heparin infusion GI prophylaxis: n/a Glucose control: SSI Mobility: bed rest Code Status: DNR Family Communication: Spoke to [place] today, see above Disposition: remain in ICU  Labs   CBC: Recent Labs  Lab 10/15/2018 0629 10/26/18 0300 10/27/18 0255 10/27/18 0832 10/28/18 0030 10/29/18 0306 10/29/18 0450  WBC 3.8* 2.7* 4.5  --  5.9  --  6.9  NEUTROABS 2.6 1.9 3.5  --  4.8  --  5.6  HGB 8.2* 8.7* 8.0* 9.5* 8.6* 9.5* 9.0*  HCT 27.0* 29.7* 27.0* 28.0* 29.2* 28.0* 29.6*  MCV 86.8 88.1 87.7  --  86.9  --  87.3  PLT 233 264 286  --  312  --  326    Basic Metabolic Panel: Recent Labs  Lab 10/03/2018 0629 10/26/18 0300 10/27/18 0255 10/27/18 0832 10/28/18 0030 10/29/18 0306 10/29/18 0450  NA 130* 142 136 134* 137 137 138  K 3.9 3.7 4.1 4.0 3.8 3.4* 3.3*  CL 92* 95* 101  --  100  --  98  CO2 27 27 28   --  28  --  28  GLUCOSE 90 169* 144*  --  117*  --  113*  BUN 11 15 18   --  17  --  18  CREATININE 0.95 0.72 0.75  --  0.78  --  0.72  CALCIUM 7.9* 8.7* 8.1*  --  8.1*  --  8.2*  MG  --  2.0 2.0  --  2.0  --   2.0   GFR: Estimated Creatinine Clearance: 74.6 mL/min (by C-G formula based on SCr of 0.72 mg/dL). Recent Labs  Lab 10/09/2018 0628  10/13/2018 0629 10/05/2018 1650 10/26/18 0300 10/27/18 0255 10/28/18 0030 10/29/18 0450  PROCALCITON  --   --  0.47  --   --   --   --   --   WBC  --    < > 3.8*  --  2.7* 4.5 5.9 6.9  LATICACIDVEN 0.9  --   --  1.0  --   --   --   --    < > = values in this interval not displayed.    Liver Function Tests: Recent Labs  Lab 10/16/2018 0629 10/26/18 0300 10/27/18 0255 10/28/18 0030 10/29/18 0450  AST 25 30 33 32 27  ALT 11 14 15 15 15   ALKPHOS 94 89 78 80 87  BILITOT 0.5 0.3 0.2* 0.4 0.3  PROT 5.6* 6.2* 5.3*  5.2* 5.3*  ALBUMIN 2.6* 2.7* 2.4* 2.6* 2.6*   No results for input(s): LIPASE, AMYLASE in the last 168 hours. No results for input(s): AMMONIA in the last 168 hours.  ABG    Component Value Date/Time   PHART 7.514 (H) 10/29/2018 0306   PCO2ART 38.5 10/29/2018 0306   PO2ART 46.0 (L) 10/29/2018 0306   HCO3 31.1 (H) 10/29/2018 0306   TCO2 32 10/29/2018 0306   ACIDBASEDEF 6.0 (H) 04/14/2016 0531   O2SAT 86.0 10/29/2018 0306     Coagulation Profile: No results for input(s): INR, PROTIME in the last 168 hours.  Cardiac Enzymes: No results for input(s): CKTOTAL, CKMB, CKMBINDEX, TROPONINI in the last 168 hours.  HbA1C: Hgb A1c MFr Bld  Date/Time Value Ref Range Status  04/10/2016 04:26 PM 5.9 (H) 4.8 - 5.6 % Final    Comment:    (NOTE)         Pre-diabetes: 5.7 - 6.4         Diabetes: >6.4         Glycemic control for adults with diabetes: <7.0     CBG: No results for input(s): GLUCAP in the last 168 hours.   Critical care time: 33 minutes     Heber Mason, MD [facility] Pager: [phone] Cell: [phone] If no response, call [phone]

## 2018-10-29 NOTE — Progress Notes (Signed)
Today's Vitals   10/28/18 2200 10/28/18 2300 10/29/18 0000 10/29/18 0154  BP: 127/64 123/74 122/61 (!) 114/100  Pulse: 77 73 77 80  Resp: 12 (!) 29 (!) 30 (!) 26  Temp:   98 F (36.7 C)   TempSrc:   Oral   SpO2: 95% 92% (!) 79% (!) 86%  Weight:      Height:      PainSc:   0-No pain    Body mass index is 29.79 kg/m.  Called as O2 sats were dropping:  Chart was reviewed: Meds and LABS  Was placed on HFNC   Stat ABG:  ABG    Component Value Date/Time   PHART 7.514 (H) 10/29/2018 0306   PCO2ART 38.5 10/29/2018 0306   PO2ART 46.0 (L) 10/29/2018 0306   HCO3 31.1 (H) 10/29/2018 0306   TCO2 32 10/29/2018 0306   ACIDBASEDEF 6.0 (H) 04/14/2016 0531   O2SAT 86.0 10/29/2018 0306   Per RN patient is comfortable and O2 sats are improving. Meds have been optimized.Durward Fortes RN to Cont. Monitoring, If she declines needs to call ICU attending stat for evaluation and recommendations.   Dr. Roger Shelter

## 2018-10-29 NOTE — Progress Notes (Signed)
Pt began to desat on HFNC.  RT/RN began NRB over HFNC to help increase flow and comfort for pt.  Pt seemed to be asymptomatic, talking to RT and no obvious increase WOB.  Pt RR would increase with stimulation but settled as she settled in.  RT will continue to monitor.

## 2018-10-29 NOTE — Progress Notes (Addendum)
[facility] Progress Note Patient Name: Allison [name] DOB: 09-29-1950 MRN: [id number]   Date of Service  10/29/2018  HPI/Events of Note  Discussed with bed side RN. Sats has dropping all night 80 to 88. ABG at 3 am compared 24 hrs ago showing P/F 46, was 56. sats 86%.  pco2 stable. Alert and follows commands. Overall stable RR. On HFNC 60 li and over that NRB mask. They do BiPAP on case by case for Covid lung.  discussed with CRNA also . Camera video has  malfunction to evaluate at this time.   eICU Interventions  - prn BiPAP trail to keep sats > 85%. - follow ABG. If worsening wob, intubate.      Intervention Category Intermediate Interventions: Respiratory distress - evaluation and management;Diagnostic test evaluation  Elmer Sow 10/29/2018, 4:27 AM   5 AM Camera in to room eval done. wob looks ok. On NRB. RT note seen. Continue care.

## 2018-10-29 NOTE — Progress Notes (Signed)
ANTICOAGULATION CONSULT NOTE - [place] for Apixaban Indication: atrial fibrillation  Allergies  Allergen Reactions  . Clarithromycin Nausea Only and Other (See Comments)    Dizzy, blurred vision, achy stomach Dizzy, blurred vision, stomach ache. unknown   . Trazodone Other (See Comments)    Weakness in legs Numbness in arms Funny feeling Rapid heart beat Loses focus. unknown   . Trazodone And Nefazodone Other (See Comments)    Weakness in legs Numbness in arms Funny feeling Rapid heart beat Lose focus  . Flecainide Nausea Only  . Naproxen Sodium Other (See Comments)    unknown  . Propafenone Hcl Nausea Only  . Sulfa Antibiotics Other (See Comments)    Blister on large toe  . Sulfamethoxazole Other (See Comments)    unknown  . Aleve [Naproxen] Nausea And Vomiting and Other (See Comments)    Stomach ache  . Doxycycline Nausea Only and Other (See Comments)    Stomach ache Stomach ache. unknown   . Durabac [Apap-Salicyl-Phenyltolox-Caff] Nausea And Vomiting  . Estradiol Nausea Only and Other (See Comments)    Hurt stomach unknown  . Ibuprofen Nausea Only and Other (See Comments)    Stomach ahce unknown  . Macrobid [Nitrofurantoin] Other (See Comments)    BLOATING  . Methocarbamol Nausea Only and Other (See Comments)    Hurt stomach unknown  . Oxaprozin Nausea Only and Other (See Comments)    Hurt stomach unknown  . Topiramate Nausea And Vomiting and Other (See Comments)    unknown  . Valdecoxib Nausea And Vomiting and Other (See Comments)    Stomach ache  unknown    Patient Measurements: Height: 5\' 7"  (170.2 cm) Weight: 188 lb 11.4 oz (85.6 kg) IBW/kg (Calculated) : 61.6 Heparin Dosing Weight: 79 kg   Vital Signs: Temp: 97 F (36.1 C) (06/30 0748) Temp Source: Axillary (06/30 0748) BP: 114/60 (06/30 0700) Pulse Rate: 86 (06/30 0748)  Labs: Recent Labs    10/27/18 0255  10/27/18 2140 10/28/18 0030  10/28/18 0530  10/28/18 0950 10/28/18 1630 10/29/18 0306 10/29/18 0450  HGB 8.0*   < >  --  8.6*  --   --   --   --  9.5* 9.0*  HCT 27.0*   < >  --  29.2*  --   --   --   --  28.0* 29.6*  PLT 286  --   --  312  --   --   --   --   --  326  APTT  --   --  44*  --    < >  --  73* 85*  --  >200*  HEPARINUNFRC  --   --  1.28*  --   --  1.22*  --   --   --  >2.20*  CREATININE 0.75  --   --  0.78  --   --   --   --   --  0.72   < > = values in this interval not displayed.    Estimated Creatinine Clearance: 74.6 mL/min (by C-G formula based on SCr of 0.72 mg/dL).  Medications:  Scheduled Meds: . atorvastatin  20 mg Oral q1800  . benzonatate  200 mg Oral TID  . bisacodyl  10 mg Rectal Daily  . calcium-vitamin D  1 tablet Oral BID  . Chlorhexidine Gluconate Cloth  6 each Topical Daily  . famotidine  20 mg Oral BID  . ferrous sulfate  325 mg  Oral Q breakfast  . fluticasone  2 spray Each Nare Daily  . furosemide  40 mg Intravenous Once  . gabapentin  600 mg Oral TID  . levothyroxine  137 mcg Oral QAC breakfast  . loratadine  10 mg Oral Daily  . methylPREDNISolone (SOLU-MEDROL) injection  40 mg Intravenous Q12H  . montelukast  10 mg Oral QPM  . pneumococcal 23 valent vaccine  0.5 mL Intramuscular Tomorrow-1000  . polyethylene glycol  17 g Oral Daily  . potassium chloride SA  40 mEq Oral Once  . potassium chloride  40 mEq Oral Once   Continuous Infusions: . heparin 1,150 Units/hr (10/29/18 0500)  . remdesivir 100 mg in NS 250 mL Stopped (10/28/18 1559)     Assessment: 67 YOF on chronic apixaban for Afib presented with coughing and hemopstyis. Diagnosed with COVID. Pharmacy initially consulted to change apixaban to IV heparin, but now changing back to Apixaban. PTA Apixaban 5mg  BID.  LD on 6/25.  Today, 10/29/2018:  Daily APTT >200 and HL >2.2 (suspect inaccurate labs, redraw ip)  Previous APTT 85 on heparin 1150 units/hr.  Heparin level elevated and not yet correlating d/t apixaban.  Continue  dosing by aPTT levels until the two labs more closely correlate.  CBC:  Hgb 9 remains low/stable, Plt WNL  No bleeding or line issue per RN.  Minimal hemoptysis noted in sputum today.  Goal of Therapy:  Heparin level 0.3-0.7 units/ml aPTT 66-102s seconds Monitor platelets by anticoagulation protocol: Yes   Plan:  - D/C heparin IV infusion and resume apixaban at the same time. - Apixaban 5 mg PO BID - Monitor for further s/s of bleeding   Lynann Beaver PharmD, BCPS Clinical pharmacist phone 7am- 5pm: [phone] 10/29/2018 8:01 AM

## 2018-10-29 NOTE — Progress Notes (Signed)
Spoke with daughter, [name], and provided full update/answered all questions. Will plan to [facility] later today.

## 2018-10-29 NOTE — Evaluation (Signed)
Physical Therapy Evaluation Patient Details Name: Allison [name] MRN: [id number] DOB: 1950-12-05 Today's Date: 10/29/2018   History of Present Illness  68 year old female who has multiple cardiovascular comorbid illnesses including valvular heart disease for which she takes Eliquis, atrial fibrillation who contracted COVID-19 pneumonia and was admitted to our facility for the same.  She had a fall early in June and suffered a rt distal peri-prosthetic femur fx and underwent ORIF [facility].  She was transferred to [facility].  There she tested positive for COVID-19. Per chart review, 10/21/18 ortho MD office note states NWB for another 4 weeks.     Clinical Impression  Pt admitted with above diagnosis. Pt currently with functional limitations due to the deficits listed below (see PT Problem List). Patient tolerated mobility extremely well while on 100% HFNC at 40L. SaO2 86-97% throughout session. HR and BP stable. Patient's mobility limited primarily by NWB RLE from recent fall and femur fracture. She is highly motivated and will benefit from skilled PT to increase their independence and safety with mobility to allow discharge to the venue listed below.       Follow Up Recommendations SNF;Supervision/Assistance - 24 hour    Equipment Recommendations  None recommended by PT    Recommendations for Other Services       Precautions / Restrictions Precautions Precautions: Fall Restrictions Weight Bearing Restrictions: Yes RLE Weight Bearing: Non weight bearing      Mobility  Bed Mobility Overal bed mobility: Needs Assistance Bed Mobility: Supine to Sit     Supine to sit: Min guard     General bed mobility comments: for RLE and balance for safety; also multiple lines/monitors  Transfers Overall transfer level: Needs assistance Equipment used: None Transfers: Lateral/Scoot Transfers          Lateral/Scoot Transfers: Min assist;From elevated  surface General transfer comment: to drop arm recliner on her left side; assist to prevent WB through RLE  Ambulation/Gait                Stairs            Wheelchair Mobility    Modified Rankin (Stroke Patients Only)       Balance Overall balance assessment: Needs assistance Sitting-balance support: No upper extremity supported;Feet supported Sitting balance-Leahy Scale: Good Sitting balance - Comments: able to fully shift weight laterally for removing bed linens while seated                                     Pertinent Vitals/Pain Pain Assessment: No/denies pain    Home Living Family/patient expects to be discharged to:: Skilled nursing facility                      Prior Function Level of Independence: Needs assistance   Gait / Transfers Assistance Needed: fell with rt distal femur fx; was in SNF for rehab PTA; was scooting to w/c with assist and then propels w/c herself  ADL's / Homemaking Assistance Needed: unclear how much assistance she needed (she is vague)        Hand Dominance        Extremity/Trunk Assessment   Upper Extremity Assessment Upper Extremity Assessment: Defer to OT evaluation    Lower Extremity Assessment Lower Extremity Assessment: RLE deficits/detail;LLE deficits/detail;Generalized weakness RLE Deficits / Details: knee flexion to ~80 in sitting with bledsoe-type brace; hip  flexion 3- with SLR and knee extensor lag, full ankle DF PROM (actively lacks 15 degrees) LLE Deficits / Details: hip extension single leg bridge unable to clear buttock off mattress (although HOB 30 due to respiratory status); ROM WFL       Communication   Communication: Interpreter utilized;Deaf(Olivia #[id number] video interpreter for sign language )  Cognition Arousal/Alertness: Awake/alert Behavior During Therapy: WFL for tasks assessed/performed Overall Cognitive Status: No family/caregiver present to determine baseline  cognitive functioning                                 General Comments: Difficulty recalling info (like that she had been at SNF for therapies) and states she thinks she is foggy due to the medicine she has had      General Comments General comments (skin integrity, edema, etc.): Patient reports 6/22 ortho MD appt she was told she could gradually begin putting weight on her RLE; explained what MD's note states in NWB x 4 weeks and she continued to insist this was incorrect    Exercises General Exercises - Lower Extremity Ankle Circles/Pumps: AROM;Both;10 reps;Supine;Seated Quad Sets: AROM;Both;Other reps (comment) Long Arc Quad: AROM;Right;5 reps;Seated Heel Slides: AROM;Right;5 reps;Supine Straight Leg Raises: Strengthening;Right;5 reps   Assessment/Plan    PT Assessment Patient needs continued PT services  PT Problem List Decreased strength;Decreased range of motion;Decreased activity tolerance;Decreased balance;Decreased mobility;Decreased cognition;Decreased knowledge of use of DME;Decreased safety awareness;Decreased knowledge of precautions;Cardiopulmonary status limiting activity       PT Treatment Interventions DME instruction;Gait training;Functional mobility training;Therapeutic activities;Therapeutic exercise;Cognitive remediation;Patient/family education;Wheelchair mobility training    PT Goals (Current goals can be found in the Care Plan section)  Acute Rehab PT Goals Patient Stated Goal: be able to walk again PT Goal Formulation: With patient Time For Goal Achievement: 11/12/18 Potential to Achieve Goals: Good    Frequency Min 2X/week   Barriers to discharge        Co-evaluation PT/OT/SLP Co-Evaluation/Treatment: Yes Reason for Co-Treatment: Complexity of the patient's impairments (multi-system involvement);To address functional/ADL transfers PT goals addressed during session: Mobility/safety with mobility;Balance;Strengthening/ROM          AM-PAC PT "6 Clicks" Mobility  Outcome Measure Help needed turning from your back to your side while in a flat bed without using bedrails?: None Help needed moving from lying on your back to sitting on the side of a flat bed without using bedrails?: A Little Help needed moving to and from a bed to a chair (including a wheelchair)?: A Little Help needed standing up from a chair using your arms (e.g., wheelchair or bedside chair)?: Total Help needed to walk in hospital room?: Total Help needed climbing 3-5 steps with a railing? : Total 6 Click Score: 13    End of Session Equipment Utilized During Treatment: Other (comment)(Rt knee brace) Activity Tolerance: Patient tolerated treatment well Patient left: in chair;with call bell/phone within reach;with nursing/sitter in room Nurse Communication: Mobility status;Weight bearing status(how to assist pt back to bed--go to her left) PT Visit Diagnosis: Muscle weakness (generalized) (M62.81);History of falling (Z91.81)    Time: 7159-5396 PT Time Calculation (min) (ACUTE ONLY): 45 min   Charges:   PT Evaluation $PT Eval Moderate Complexity: 1 Mod            [employer], PT 10/29/2018, 5:40 PM

## 2018-10-29 NOTE — Progress Notes (Signed)
CRITICAL VALUE ALERT  Critical Value:  PTT >200  Date & Time Notied:  10/29/18 at 0740   Provider Notified: Dr. Candiss Norse  Orders Received/Actions taken: No new orders at this time.

## 2018-10-30 ENCOUNTER — Telehealth: Payer: Self-pay | Admitting: Interventional Cardiology

## 2018-10-30 ENCOUNTER — Encounter: Payer: Self-pay | Admitting: Interventional Cardiology

## 2018-10-30 LAB — CBC WITH DIFFERENTIAL/PLATELET
Abs Immature Granulocytes: 0.12 10*3/uL — ABNORMAL HIGH (ref 0.00–0.07)
Basophils Absolute: 0 10*3/uL (ref 0.0–0.1)
Basophils Relative: 0 %
Eosinophils Absolute: 0 10*3/uL (ref 0.0–0.5)
Eosinophils Relative: 0 %
HCT: 30.2 % — ABNORMAL LOW (ref 36.0–46.0)
Hemoglobin: 8.8 g/dL — ABNORMAL LOW (ref 12.0–15.0)
Immature Granulocytes: 1 %
Lymphocytes Relative: 9 %
Lymphs Abs: 0.8 10*3/uL (ref 0.7–4.0)
MCH: 25.7 pg — ABNORMAL LOW (ref 26.0–34.0)
MCHC: 29.1 g/dL — ABNORMAL LOW (ref 30.0–36.0)
MCV: 88.3 fL (ref 80.0–100.0)
Monocytes Absolute: 0.4 10*3/uL (ref 0.1–1.0)
Monocytes Relative: 4 %
Neutro Abs: 7.2 10*3/uL (ref 1.7–7.7)
Neutrophils Relative %: 86 %
Platelets: 259 10*3/uL (ref 150–400)
RBC: 3.42 MIL/uL — ABNORMAL LOW (ref 3.87–5.11)
RDW: 17.4 % — ABNORMAL HIGH (ref 11.5–15.5)
WBC: 8.5 10*3/uL (ref 4.0–10.5)
nRBC: 0.4 % — ABNORMAL HIGH (ref 0.0–0.2)

## 2018-10-30 LAB — CULTURE, BLOOD (ROUTINE X 2)
Culture: NO GROWTH
Culture: NO GROWTH
Special Requests: ADEQUATE

## 2018-10-30 LAB — COMPREHENSIVE METABOLIC PANEL
ALT: 15 U/L (ref 0–44)
AST: 23 U/L (ref 15–41)
Albumin: 2.8 g/dL — ABNORMAL LOW (ref 3.5–5.0)
Alkaline Phosphatase: 84 U/L (ref 38–126)
Anion gap: 8 (ref 5–15)
BUN: 20 mg/dL (ref 8–23)
CO2: 30 mmol/L (ref 22–32)
Calcium: 8.2 mg/dL — ABNORMAL LOW (ref 8.9–10.3)
Chloride: 101 mmol/L (ref 98–111)
Creatinine, Ser: 0.78 mg/dL (ref 0.44–1.00)
GFR calc Af Amer: >60 mL/min (ref >60)
GFR calc non Af Amer: >60 mL/min (ref >60)
Glucose, Bld: 112 mg/dL — ABNORMAL HIGH (ref 70–99)
Potassium: 4.3 mmol/L (ref 3.5–5.1)
Sodium: 139 mmol/L (ref 135–145)
Total Bilirubin: 0.5 mg/dL (ref 0.3–1.2)
Total Protein: 5.3 g/dL — ABNORMAL LOW (ref 6.5–8.1)

## 2018-10-30 LAB — BRAIN NATRIURETIC PEPTIDE: B Natriuretic Peptide: 86.4 pg/mL (ref 0.0–100.0)

## 2018-10-30 LAB — C-REACTIVE PROTEIN: CRP: 2 mg/dL — ABNORMAL HIGH (ref <1.0)

## 2018-10-30 LAB — FERRITIN: Ferritin: 238 ng/mL (ref 11–307)

## 2018-10-30 LAB — MAGNESIUM: Magnesium: 2.1 mg/dL (ref 1.7–2.4)

## 2018-10-30 LAB — D-DIMER, QUANTITATIVE: D-Dimer, Quant: 3.49 ug/mL-FEU — ABNORMAL HIGH (ref 0.00–0.50)

## 2018-10-30 MED ORDER — BOOST / RESOURCE BREEZE PO LIQD CUSTOM
1.0000 | Freq: Three times a day (TID) | ORAL | Status: DC
  Administered 2018-10-30 – 2018-11-04 (×14): 1 via ORAL
  Filled 2018-10-30 (×19): qty 1

## 2018-10-30 MED ORDER — FUROSEMIDE 10 MG/ML IJ SOLN
40.0000 mg | Freq: Once | INTRAMUSCULAR | Status: AC
  Administered 2018-10-30: 40 mg via INTRAVENOUS
  Filled 2018-10-30: qty 4

## 2018-10-30 MED ORDER — CLONAZEPAM 0.5 MG PO TBDP
0.5000 mg | ORAL_TABLET | Freq: Two times a day (BID) | ORAL | Status: DC
  Administered 2018-10-30 – 2018-11-05 (×13): 0.5 mg via ORAL
  Filled 2018-10-30 (×13): qty 1

## 2018-10-30 MED ORDER — BISACODYL 5 MG PO TBEC
10.0000 mg | DELAYED_RELEASE_TABLET | Freq: Once | ORAL | Status: AC
  Administered 2018-10-30: 10 mg via ORAL
  Filled 2018-10-30: qty 2

## 2018-10-30 MED ORDER — DOCUSATE SODIUM 100 MG PO CAPS
200.0000 mg | ORAL_CAPSULE | Freq: Two times a day (BID) | ORAL | Status: DC
  Administered 2018-10-30 – 2018-11-04 (×9): 200 mg via ORAL
  Filled 2018-10-30 (×10): qty 2

## 2018-10-30 NOTE — Progress Notes (Signed)
Physical Therapy Treatment Patient Details Name: Allison [name] MRN: [id number] DOB: 08/12/1950 Today's Date: 10/30/2018    History of Present Illness 68 year old female who has multiple cardiovascular comorbid illnesses including valvular heart disease for which she takes Eliquis, atrial fibrillation who contracted COVID-19 pneumonia and was admitted to our facility for the same.  She had a fall early in June and suffered a rt distal peri-prosthetic femur fx and underwent ORIF [facility].  She was transferred to [facility].  There she tested positive for COVID-19. Per chart review, 10/21/18 ortho MD office note states NWB for another 4 weeks.     PT Comments    The patient performs lateral scoot transfer with min guard to min assist for safety. Patient SaO2 95% on  40 L HFNC/80% FiO2, HR 70's Patient stood x 2 with 2 assist, using RW.  Patient tolerated well. Continue PT.  Follow Up Recommendations  SNF;Supervision/Assistance - 24 hour     Equipment Recommendations  None recommended by PT    Recommendations for Other Services       Precautions / Restrictions Precautions Precaution Comments: /bledsoe brace Restrictions RLE Weight Bearing: Non weight bearing    Mobility  Bed Mobility   Bed Mobility: Supine to Sit     Supine to sit: Min guard     General bed mobility comments: for RLE and balance for safety; also multiple lines/monitors  Transfers Overall transfer level: Needs assistance Equipment used: None Transfers: Lateral/Scoot Transfers          Lateral/Scoot Transfers: From elevated surface;Min guard General transfer comment: dropped recliner armrest for lateral scoot, patient dperformed 80% of transfer  Ambulation/Gait Ambulation/Gait assistance: Mod assist;+2 physical assistance;+2 safety/equipment   Assistive device: Rolling walker (2 wheeled)       General Gait Details: stood at RW x 2 support at left knee with therapist  supporting right foot off floor. Stood x 3-=45"   Marine scientist Rankin (Stroke Patients Only)       Balance Overall balance assessment: Needs assistance Sitting-balance support: No upper extremity supported;Feet supported Sitting balance-Leahy Scale: Good     Standing balance support: Bilateral upper extremity supported;During functional activity Standing balance-Leahy Scale: Poor Standing balance comment: support of left knee and  right foot held from floor for watching WB                            Cognition                                              Exercises General Exercises - Lower Extremity Ankle Circles/Pumps: AROM;Both;10 reps;Supine;20 reps Quad Sets: AROM;Both;20 reps;Supine Long Arc Quad: AROM;Right;Seated;20 reps Straight Leg Raises: Strengthening;Right;5 reps    General Comments        Pertinent Vitals/Pain Faces Pain Scale: Hurts a little bit Pain Location: L hip when reaching for foot Pain Descriptors / Indicators: Discomfort Pain Intervention(s): Monitored during session    Home Living                      Prior Function            PT Goals (current goals can now be found in the  care plan section) Progress towards PT goals: Progressing toward goals    Frequency    Min 2X/week      PT Plan Current plan remains appropriate    Co-evaluation PT/OT/SLP Co-Evaluation/Treatment: Yes Reason for Co-Treatment: For patient/therapist safety          AM-PAC PT "6 Clicks" Mobility   Outcome Measure  Help needed turning from your back to your side while in a flat bed without using bedrails?: None Help needed moving from lying on your back to sitting on the side of a flat bed without using bedrails?: None Help needed moving to and from a bed to a chair (including a wheelchair)?: A Little Help needed standing up from a chair using your arms (e.g., wheelchair  or bedside chair)?: Total Help needed to walk in hospital room?: Total Help needed climbing 3-5 steps with a railing? : Total 6 Click Score: 14    End of Session Equipment Utilized During Treatment: Oxygen;Gait belt Activity Tolerance: Patient tolerated treatment well Patient left: in chair;with call bell/phone within reach;with nursing/sitter in room Nurse Communication: Mobility status;Weight bearing status PT Visit Diagnosis: Muscle weakness (generalized) (M62.81);History of falling (Z91.81)     Time: 7078-6754 PT Time Calculation (min) (ACUTE ONLY): 47 min  Charges:  $Therapeutic Exercise: 8-22 mins $Therapeutic Activity: 8-22 mins                     Blanchard Kelch PT Acute Rehabilitation Services Pager [phone] Office [phone]    Rada Hay 10/30/2018, 4:34 PM

## 2018-10-30 NOTE — Progress Notes (Signed)
NAME:  Allison [name], MRN:  [id number], DOB:  03/12/1951, LOS: 5 ADMISSION DATE:  07-Jan-2019, CONSULTATION DATE:  10/27/2018 REFERRING MD:  Jerral RalphGhimire, CHIEF COMPLAINT:  Dyspnea   Brief History   68 year old female with hypertension and combined systolic/diastolic heart failure admitted [facility] on October 25, 2018 from a nursing home in the setting of acute respiratory failure with hypoxemia from COVID-19 pneumonia.  History of present illness   This is a 68 year old female who has multiple cardiovascular comorbid illnesses including valvular heart disease for which she takes Eliquis, atrial fibrillation who contracted COVID-19 pneumonia and was admitted to our facility for the same.  She had a fall early in June and suffered a leg fracture and underwent open reduction and internal fixation of her right distal femur [facility].  She was transferred to [facility] rehab.  There she tested positive for COVID-19.  She presented to [facility] on June 26 with hemoptysis, subjective fever, malaise, some abdominal pain.  After admission she was treated with steroids, remdesivir, Actemra, and convalescent plasma.  She developed worsening hypoxemia with coughing spells and was transferred to the intensive care unit early in the morning on October 27, 2018 for closer observation.   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, mitral valve repair, aortic valve replacement Nonischemic cardiomyopathy Patent foramen ovale Permanent atrial fibrillation Hypothyroidism Hypertension Hyperlipidemia GERD Fibromyalgia Fatty liver   Significant Hospital Events   6/26 admission 6/28 transfer to ICU for increasing O2 needs  Consults:  PCCM  Procedures:    Significant Diagnostic Tests:  2018 transthoracic echocardiogram showed bioprosthetic aortic and mitral valves, left atrium dilation, LVEF 50 to 55% CXR worsening bilateral  infiltrates compared to 6/27 (personnally interpreted)  Micro Data:  6/24 SARS-COV2> positive 6/26 blood culture > NGTD  Antimicrobials:  6/26 Ceftriaxone > x1 6/26 Azithro > x1 6/26 Remdesivir >  6/26 Actemra x1 627 convalescent plasma   Interim history/subjective:   Improving dyspnea per patient. Complains of pleuritic chest pain.  Objective   Blood pressure 117/62, pulse 89, temperature 98.7 F (37.1 C), temperature source Oral, resp. rate (!) 27, height 5\' 7"  (1.702 m), weight 83.1 kg, SpO2 92 %.    FiO2 (%):  [60 %-100 %] 60 %   Intake/Output Summary (Last 24 hours) at 10/30/2018 1209 Last data filed at 10/30/2018 0700 Gross per 24 hour  Intake -  Output 900 ml  Net -900 ml   Filed Weights   10/28/18 0500 10/29/18 0500 10/30/18 0500  Weight: 86.3 kg 85.6 kg 83.1 kg    Examination:  General:  Sitting up in bed with no distress. Slender woman, appears older than stated age. HENT: NCAT OP clear PULM: diffuse crackles bilaterally. No distress CV: RRR, no mgr, no JVD GI: BS+, soft, nontender MSK: normal bulk and tone Neuro: awake, alert, no distress, MAEW   Resolved Hospital Problem list     Assessment & Plan:  Acute respiratory failure with hypoxemia due to COVID-19 pneumonia Hypoxemia currently worse than symptom of dyspnea Currently showing no signs of ventilatory failure Acute diastolic heart failure leading to acute pulmonary edema Solumedrol, remdesivir per protocol Continue diuresis Maintain in ICU Tolerate periods of hypoxemia, goal at rest is greater than 85% SaO2, with movement ideally above 75% Decision for intubation should be based on a change in mental status or physical evidence of ventilatory failure such as nasal flaring, accessory muscle use, paradoxical breathing Out of  bed to chair as able Prone positioning while in bed Continue to administer O2 via heated high flow  History of aortic/mitral valve replacement, diastolic heart failure.  On Eliquis for atrial fibrillation Mild hemoptysis > resolved Diuresis Telemetry Change heparin to eliquis   Goals of care:  Code status changed to DNR following conversation with Dr Lake Bells.  Best practice:  Diet: regular diet Pain/Anxiety/Delirium protocol (if indicated): n/a VAP protocol (if indicated): n/a DVT prophylaxis: heparin infusion GI prophylaxis: n/a Glucose control: SSI Mobility: bed rest Code Status: DNR Family Communication: daughter updated 6/30 by Dr Lake Bells. Disposition: Potential transfer to floor.  Labs   CBC: Recent Labs  Lab 10/26/18 0300 10/27/18 0255 10/27/18 1638 10/28/18 0030 10/29/18 0306 10/29/18 0450 10/30/18 0505  WBC 2.7* 4.5  --  5.9  --  6.9 8.5  NEUTROABS 1.9 3.5  --  4.8  --  5.6 7.2  HGB 8.7* 8.0* 9.5* 8.6* 9.5* 9.0* 8.8*  HCT 29.7* 27.0* 28.0* 29.2* 28.0* 29.6* 30.2*  MCV 88.1 87.7  --  86.9  --  87.3 88.3  PLT 264 286  --  312  --  326 453    Basic Metabolic Panel: Recent Labs  Lab 10/26/18 0300 10/27/18 0255 10/27/18 0832 10/28/18 0030 10/29/18 0306 10/29/18 0450 10/30/18 0505  NA 142 136 134* 137 137 138 139  K 3.7 4.1 4.0 3.8 3.4* 3.3* 4.3  CL 95* 101  --  100  --  98 101  CO2 27 28  --  28  --  28 30  GLUCOSE 169* 144*  --  117*  --  113* 112*  BUN 15 18  --  17  --  18 20  CREATININE 0.72 0.75  --  0.78  --  0.72 0.78  CALCIUM 8.7* 8.1*  --  8.1*  --  8.2* 8.2*  MG 2.0 2.0  --  2.0  --  2.0 2.1   GFR: Estimated Creatinine Clearance: 73.6 mL/min (by C-G formula based on SCr of 0.78 mg/dL). Recent Labs  Lab Nov 11, 2018 0628 11/11/18 0629 2018-11-11 1650  10/27/18 0255 10/28/18 0030 10/29/18 0450 10/30/18 0505  PROCALCITON  --  0.47  --   --   --   --   --   --   WBC  --  3.8*  --    < > 4.5 5.9 6.9 8.5  LATICACIDVEN 0.9  --  1.0  --   --   --   --   --    < > = values in this interval not displayed.    Liver Function Tests: Recent Labs  Lab 10/26/18 0300 10/27/18 0255 10/28/18 0030 10/29/18 0450  10/30/18 0505  AST 30 33 32 27 23  ALT 14 15 15 15 15   ALKPHOS 89 78 80 87 84  BILITOT 0.3 0.2* 0.4 0.3 0.5  PROT 6.2* 5.3* 5.2* 5.3* 5.3*  ALBUMIN 2.7* 2.4* 2.6* 2.6* 2.8*   No results for input(s): LIPASE, AMYLASE in the last 168 hours. No results for input(s): AMMONIA in the last 168 hours.  ABG    Component Value Date/Time   PHART 7.514 (H) 10/29/2018 0306   PCO2ART 38.5 10/29/2018 0306   PO2ART 46.0 (L) 10/29/2018 0306   HCO3 31.1 (H) 10/29/2018 0306   TCO2 32 10/29/2018 0306   ACIDBASEDEF 6.0 (H) 04/14/2016 0531   O2SAT 86.0 10/29/2018 0306     Coagulation Profile: No results for input(s): INR, PROTIME in the last 168 hours.  Cardiac  Enzymes: No results for input(s): CKTOTAL, CKMB, CKMBINDEX, TROPONINI in the last 168 hours.  HbA1C: Hgb A1c MFr Bld  Date/Time Value Ref Range Status  04/10/2016 04:26 PM 5.9 (H) 4.8 - 5.6 % Final    Comment:    (NOTE)         Pre-diabetes: 5.7 - 6.4         Diabetes: >6.4         Glycemic control for adults with diabetes: <7.0     CBG: No results for input(s): GLUCAP in the last 168 hours.  COVID-19 Labs  Recent Labs    10/28/18 0030 10/28/18 0500 10/29/18 0450 10/30/18 0505  DDIMER  --  1.55* 1.99* 3.49*  FERRITIN 414*  --  350* 238  CRP 5.1*  --  2.9* 2.0*    No results found for: SARSCOV2NAA    Lynnell Catalan, MD [facility] ICU Physician [facility]  Pager: [phone] Mobile: [phone] After hours: [phone].

## 2018-10-30 NOTE — Telephone Encounter (Signed)
Follow message:

## 2018-10-30 NOTE — Progress Notes (Signed)
Spoke to patient's daughter Darden Amber and gave her updates on patient's condition. I answered her questions regarding patient's care and condition. She said she would relay all information to the rest of the family. She will call tomorrow for more updates. She was very grateful for the care given to her mother.

## 2018-10-30 NOTE — Telephone Encounter (Signed)
Error

## 2018-10-30 NOTE — Telephone Encounter (Signed)
Attempted to contact the spouse, to advise that he is not on the [facility] and would need to contact the hospital for updates. The phone rang and the spouse did not answer.

## 2018-10-30 NOTE — Telephone Encounter (Signed)
New Message   Patients spouse is calling to advise that she is in the hospital and is in ICU. She was sob and has contracted covid19. He wants to know if possible that the status of his wife is checked on because he doesn't know if she is getting better or worse. He is concern that she may not make it. Please call.

## 2018-10-30 NOTE — Progress Notes (Signed)
OT Evaluation - late entry  PTA, pt was receiving rehab at [facility] due to fall and RLE fx. Per pt, she wears Bledsoe brace during mobility, but not when in bed. Per last ortho note, pt NWB for another 3 weeks. Pt is able to complete a lateral scoot transfer with drop down recliner arm with min A +2 for safety. Requires set up for UB ADLa dn mod A for LB ADL. Seen on 40L with 100% FiO2 and tolerated well with SpO2 lowest @ 86. Pt able to talk throughout session without labored breathing. Pt taught level 1 theraband exercises. Nsg educated on transfer technique and to encourage pt ot complete theraband ex.  Will follow acutely to facilitate return to [facility] for rehab.    10/29/18 1530  OT Visit Information  Last OT Received On 10/30/18  Assistance Needed +2 (for standing)  PT/OT/SLP Co-Evaluation/Treatment Yes  Reason for Co-Treatment Complexity of the patient's impairments (multi-system involvement)  OT goals addressed during session ADL's and self-care;Strengthening/ROM  History of Present Illness 68 year old female who has multiple cardiovascular comorbid illnesses including valvular heart disease for which she takes Eliquis, atrial fibrillation who contracted COVID-19 pneumonia and was admitted to our facility for the same.  She had a fall early in June and suffered a rt distal peri-prosthetic femur fx and underwent ORIF [facility].  She was transferred to [facility].  There she tested positive for COVID-19. Per chart review, 10/21/18 ortho MD office note states NWB for another 4 weeks.   Precautions  Precautions Fall  Restrictions  Weight Bearing Restrictions Yes  RLE Weight Bearing NWB  Home Living  Family/patient expects to be discharged to: Skilled nursing facility  Prior Function  Level of [place] Needs assistance  Gait / Transfers Assistance Needed fell with rt distal femur fx; was in SNF for rehab PTA; was scooting to w/c with assist and then propels w/c  herself  ADL's / [place] unclear how much assistance she needed (she is vague)  Communication / Swallowing Assistance Needed deaf  Risk analyst utilized;Deaf Minette Brine [phone] video interpreter for sign language )  Pain Assessment  Pain Assessment Faces  Faces Pain Scale 2  Pain Location R LE  Pain Descriptors / Indicators Discomfort  Pain Intervention(s) Limited activity within patient's tolerance  Cognition  Arousal/Alertness Awake/alert  Behavior During Therapy WFL for tasks assessed/performed  Overall Cognitive Status No family/caregiver present to determine baseline cognitive functioning  General Comments Difficulty recalling info (like that she had been at SNF for therapies) and states she thinks she is foggy due to the medicine she has had  Upper Extremity Assessment  Upper Extremity Assessment Generalized weakness  Lower Extremity Assessment  Lower Extremity Assessment Defer to PT evaluation  RLE Deficits / Details knee flexion to ~80 in sitting with bledsoe-type brace; hip flexion 3- with SLR and knee extensor lag, full ankle DF PROM (actively lacks 15 degrees)  LLE Deficits / Details hip extension single leg bridge unable to clear buttock off mattress (although HOB 30 due to respiratory status); ROM WFL  Cervical / Trunk Assessment  Cervical / Trunk Assessment Normal  ADL  Overall ADL's  Needs assistance/impaired  Eating/Feeding Independent  Grooming Set up;Sitting  Upper Body Bathing Set up;Sitting  Lower Body Bathing Moderate assistance;Sit to/from stand  Upper Body Dressing  Set up;Sitting  Lower Body Dressing Moderate assistance;Sitting/lateral leans;Sit to/from stand  Toilet Transfer Minimal assistance (lateral scoot - simulated with drop arm recliner)  Toileting- Clothing Manipulation and Hygiene Minimal assistance;Sitting/lateral lean  Functional mobility during ADLs Minimal assistance;+2 for  safety/equipment (lateral scoot)  Bed Mobility  Overal bed mobility Needs Assistance  Bed Mobility Supine to Sit  Supine to sit Min guard  General bed mobility comments for RLE and balance for safety; also multiple lines/monitors  Transfers  Overall transfer level Needs assistance  Equipment used None  Transfers Lateral/Scoot Transfers   Lateral/Scoot Transfers Min assist;From elevated surface  General transfer comment to drop arm recliner on her left side; assist to prevent WB through RLE  Balance  Overall balance assessment Needs assistance  Sitting-balance support No upper extremity supported;Feet supported  Sitting balance-Leahy Scale Good  Sitting balance - Comments able to fully shift weight laterally for removing bed linens while seated  Exercises  Exercises General Upper Extremity  General Exercises - Upper Extremity  Shoulder Flexion AROM;Strengthening;Both;10 reps;Seated;Theraband  Shoulder Extension AROM;Strengthening;Both;10 reps;Seated;Theraband  Shoulder ABduction Strengthening;Both;10 reps;Seated;Theraband  Elbow Flexion Strengthening;Both;10 reps;Seated;Theraband  Elbow Extension Strengthening;Both;10 reps;Seated;Theraband  Theraband Level (Shoulder Flexion) Level 1 (Yellow)  Theraband Level (Shoulder Extension) Level 1 (Yellow)  Theraband Level (Shoulder Abduction) Level 1 (Yellow)  Theraband Level (Elbow Flexion) Level 1 (Yellow)  Theraband Level (Elbow Extension) Level 1 (Yellow)  OT - End of Session  Equipment Utilized During Treatment Oxygen (40L)  Activity Tolerance Patient tolerated treatment well  Patient left in chair;with call bell/phone within reach;with nursing/sitter in room  Nurse Communication Mobility status;Weight bearing status  OT Assessment  OT Recommendation/Assessment Patient needs continued OT Services  OT Visit Diagnosis Unsteadiness on feet (R26.81);Other abnormalities of gait and mobility (R26.89);Muscle weakness (generalized)  (M62.81);Pain  Pain - Right/Left Right  Pain - part of body Leg  OT Problem List Decreased strength;Decreased activity tolerance;Impaired balance (sitting and/or standing);Decreased safety awareness;Decreased knowledge of use of DME or AE;Decreased knowledge of precautions;Cardiopulmonary status limiting activity;Pain  OT Plan  OT Frequency (ACUTE ONLY) Min 3X/week  OT Treatment/Interventions (ACUTE ONLY) Self-care/ADL training;Therapeutic exercise;Neuromuscular education;Energy conservation;DME and/or AE instruction;Therapeutic activities;Patient/family education;Balance training  AM-PAC OT "6 Clicks" Daily Activity Outcome Measure (Version 2)  Help from another person eating meals? 4  Help from another person taking care of personal grooming? 3  Help from another person toileting, which includes using toliet, bedpan, or urinal? 2  Help from another person bathing (including washing, rinsing, drying)? 2  Help from another person to put on and taking off regular upper body clothing? 3  Help from another person to put on and taking off regular lower body clothing? 2  6 Click Score 16  OT Recommendation  Follow Up Recommendations SNF;Supervision/Assistance - 24 hour  OT Equipment None recommended by OT  Individuals Consulted  Consulted and Agree with Results and Recommendations Patient  Acute Rehab OT Goals  Patient Stated Goal be able to walk again  OT Goal Formulation With patient  Time For Goal Achievement 11/13/18  Potential to Achieve Goals Good  OT Time Calculation  OT Start Time (ACUTE ONLY) 1500  OT Stop Time (ACUTE ONLY) 1545  OT Time Calculation (min) 45 min  OT General Charges  $OT Visit 1 Visit  Written Expression  Dominant Hand Right  Luisa Dago, OT/L   Acute OT Clinical Specialist Acute Rehabilitation Services Pager [phone] Office [phone]

## 2018-10-30 NOTE — Progress Notes (Signed)
PROGRESS NOTE                                                                                                                                                                                                             Patient Demographics:    Allison [name], is a 68 y.o. female, DOB - 08-22-1950, GNF:[id number]  Outpatient Primary MD for the patient is Wenda Low, MD    LOS - 5  Admit date - 10/09/2018    Chief Complaint  Patient presents with   Shortness of Breath       Brief Narrative  Patient is a 68 y.o. 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  She presented to the hospital on 6/26 with approximately 5-6-day history of cough along with minimal hemoptysis, subjective fever, malaise and worsening shortness of breath.  She was subsequently diagnosed with COVID-19-and found to have acute hypoxic respiratory failure secondary to COVID-19 pneumonia.  She was then admitted to the hospitalist service at [facility].   Subjective:   Patient in bed, slightly anxious but overall comfortable, says shortness of breath has improved, complains of mild constipation.   Assessment  & Plan :     1. Acute Hypoxic Resp. Failure due to Acute Covid 19 Viral Pneumonitis during the ongoing 2020 Covid 19 Pandemic -  Has received maximal treatment.  6/25>> Solu-Medrol 6/25>> Remdesivir 6/25>> Actemra 6/26 >> Plasma  Continues to be severely hypoxic on high flow nasal cannula oxygen and ICU, overall does not appear to be in any distress, continue monitoring with supportive care, refuses to get prone.   Will encourage to sit up in chair and use flutter valve for pulmonary toiletry.  PCCM on board.  For now continue IV steroids.  FiO2 (%):  [80 %-100 %] 100 %  ABG     Component Value Date/Time   PHART 7.514 (H) 10/29/2018 0306   PCO2ART 38.5 10/29/2018 0306   PO2ART 46.0 (L) 10/29/2018 0306   HCO3 31.1 (H) 10/29/2018 0306   TCO2 32 10/29/2018 0306   ACIDBASEDEF 6.0 (  H) 04/14/2016 0531   O2SAT 86.0 10/29/2018 0306    COVID-19 Labs  Recent Labs    10/28/18 0030 10/28/18 0500 10/29/18 0450 10/30/18 0505  DDIMER  --  1.55* 1.99* 3.49*  FERRITIN 414*  --  350* 238  CRP 5.1*  --  2.9* 2.0*    No results found for: SARSCOV2NAA   Hepatic Function Latest Ref Rng & Units 10/30/2018 10/29/2018 10/28/2018  Total Protein 6.5 - 8.1 g/dL 5.3(L) 5.3(L) 5.2(L)  Albumin 3.5 - 5.0 g/dL 2.8(L) 2.6(L) 2.6(L)  AST 15 - 41 U/L 23 27 32  ALT 0 - 44 U/L _0 Alk Phosphatase 38 - 126 U/L 84 87 80  Total Bilirubin 0.3 - 1.2 mg/dL 0.5 0.3 0.4        Component Value Date/Time   BNP 86.4 10/30/2018 0505   BNP 310.6 (H) 01/20/2016 1435      2.  Persistent cough related mild bronchial injury causing sputum laced with few streaks of blood - do think this is true hemoptysis this was mild bronchial irritation from persistent cough, this has resolved, resume Eliquis and stop heparin drip.  3.  History of paroxysmal atrial fibrillation status post maze procedure in the past.  Mali vas 2 score will be at least 4.  Resume Eliquis currently in sinus.  4.  Mild acute on chronic diastolic CHF.  EF 55% on last echocardiogram in January 2018.  Proving with diuresis, continue IV Lasix and monitor need daily.  5.  Acute closed displaced periprosthetic fracture of the distal femur involving the right knee replacement-status post ORIF 10/06/18; -continue nonweightbearing in the R.Leg, continue right knee brace, PT and SNF upon DC.  6.  Essential hypertension.  Stable off medications.  7.  History of  bioprosthetic aortic valve replacement, mitral valve repair, tricuspid valve repair.  History of PFO closure.  Supportive care.  8.  Hypothyroidism.  On Synthroid.  9.  Deafness.  Supportive care.  10. Underlying history of asthma.  Stable no wheezing.  11. GERD.  On PPI.  12.  Mild constipation.  Bowel regimen changed.    Condition - Extremely Guarded  Family Communication  :  None  Code Status :  Full  Diet :   Diet Order            Diet Heart Room service appropriate? Yes; Fluid consistency: Thin  Diet effective now               Disposition Plan  :  ICU  Consults  :  PCCM  Procedures  :    PICC - 10/27/18  PUD Prophylaxis : Famotidine  DVT Prophylaxis  :  Eliquis/ Heparin gtt  Lab Results  Component Value Date   PLT 259 10/30/2018    Inpatient Medications  Scheduled Meds:  apixaban  5 mg Oral BID   atorvastatin  20 mg Oral q1800   benzonatate  200 mg Oral TID   bisacodyl  10 mg Rectal Daily   calcium-vitamin D  1 tablet Oral BID   Chlorhexidine Gluconate Cloth  6 each Topical Daily   famotidine  20 mg Oral BID   ferrous sulfate  325 mg Oral Q breakfast   fluticasone  2 spray Each Nare Daily   furosemide  40 mg Intravenous Once   gabapentin  600 mg Oral TID   levothyroxine  137 mcg Oral QAC breakfast   loratadine  10 mg Oral Daily   methylPREDNISolone (SOLU-MEDROL) injection  40 mg Intravenous Q12H   montelukast  10 mg Oral QPM   polyethylene glycol  17 g Oral Daily   Continuous Infusions:  PRN Meds:.acetaminophen, bisacodyl, chlorpheniramine-HYDROcodone, diphenhydrAMINE, guaiFENesin-dextromethorphan, HYDROcodone-acetaminophen, Ipratropium-Albuterol, LORazepam, menthol-cetylpyridinium, [DISCONTINUED] ondansetron **OR** ondansetron (ZOFRAN) IV, simethicone, sodium chloride, sodium chloride flush, tiZANidine  Antibiotics  :    Anti-infectives (From admission, onward)   Start     Dose/Rate Route Frequency Ordered Stop    10/26/18 1600  remdesivir 100 mg in sodium chloride 0.9 % 250 mL IVPB     100 mg 500 mL/hr over 30 Minutes Intravenous Every 24 hours 10/19/2018 1440 10/29/18 1650   10/17/2018 1600  remdesivir 200 mg in sodium chloride 0.9 % 250 mL IVPB     200 mg 500 mL/hr over 30 Minutes Intravenous Once 10/20/2018 1440 10/24/2018 1757   10/17/2018 0830  cefTRIAXone (ROCEPHIN) 2 g in sodium chloride 0.9 % 100 mL IVPB  Status:  Discontinued     2 g 200 mL/hr over 30 Minutes Intravenous Every 24 hours 10/21/2018 0807 10/05/2018 1509   10/11/2018 0830  azithromycin (ZITHROMAX) 500 mg in sodium chloride 0.9 % 250 mL IVPB  Status:  Discontinued     500 mg 250 mL/hr over 60 Minutes Intravenous Every 24 hours 10/16/2018 0807 10/28/2018 1509       Time Spent in minutes  30   Lala Lund M.D on 10/30/2018 at 10:06 AM  To page go to [email or web address] - password [facility]  [facility] -  Office  [phone]    See all Orders from today for further details    Objective:   Vitals:   10/30/18 0500 10/30/18 0600 10/30/18 0700 10/30/18 0804  BP: (!) 116/54 (!) 119/57 128/68 117/62  Pulse: 78 69 70 72  Resp: (!) 21 (!) 23 (!) 24 (!) 28  Temp:      TempSrc:      SpO2: 96% (!) 84% (!) 85% 90%  Weight: 83.1 kg     Height:        Wt Readings from Last 3 Encounters:  10/30/18 83.1 kg  03/19/18 74.8 kg  01/02/18 78.5 kg     Intake/Output Summary (Last 24 hours) at 10/30/2018 1006 Last data filed at 10/30/2018 0700 Gross per 24 hour  Intake --  Output 1700 ml  Net -1700 ml     Physical Exam  Awake Alert, No new F.N deficits,  [place].AT,PERRAL Supple Neck,No JVD, No cervical lymphadenopathy appriciated.  Symmetrical Chest wall movement, Good air movement bilaterally, CTAB RRR,No Gallops, Rubs or new Murmurs, No Parasternal Heave +ve B.Sounds, Abd Soft, No tenderness, No organomegaly appriciated, No rebound - guarding or rigidity. No Cyanosis, Clubbing or edema,  R.Hip-Knee bruised, R knee brace in place   Data  Review:    CBC Recent Labs  Lab 10/26/18 0300 10/27/18 0255 10/27/18 0832 10/28/18 0030 10/29/18 0306 10/29/18 0450 10/30/18 0505  WBC 2.7* 4.5  --  5.9  --  6.9 8.5  HGB 8.7* 8.0* 9.5* 8.6* 9.5* 9.0* 8.8*  HCT 29.7* 27.0* 28.0* 29.2* 28.0* 29.6* 30.2*  PLT 264 286  --  312  --  326 259  MCV 88.1 87.7  --  86.9  --  87.3 88.3  MCH 25.8* 26.0  --  25.6*  --  26.5 25.7*  MCHC 29.3* 29.6*  --  29.5*  --  30.4 29.1*  RDW 16.7* 16.5*  --  16.4*  --  16.9* 17.4*  LYMPHSABS 0.7 0.6*  --  0.7  --  0.8 0.8  MONOABS 0.2 0.3  --  0.4  --  0.5 0.4  EOSABS 0.0 0.0  --  0.0  --  0.0 0.0  BASOSABS 0.0 0.0  --  0.0  --  0.0 0.0    Chemistries  Recent Labs  Lab 10/26/18 0300 10/27/18 0255 10/27/18 0832 10/28/18 0030 10/29/18 0306 10/29/18 0450 10/30/18 0505  NA 142 136 134* 137 137 138 139  K 3.7 4.1 4.0 3.8 3.4* 3.3* 4.3  CL 95* 101  --  100  --  98 101  CO2 27 28  --  28  --  28 30  GLUCOSE 169* 144*  --  117*  --  113* 112*  BUN 15 18  --  17  --  18 20  CREATININE 0.72 0.75  --  0.78  --  0.72 0.78  CALCIUM 8.7* 8.1*  --  8.1*  --  8.2* 8.2*  MG 2.0 2.0  --  2.0  --  2.0 2.1  AST 30 33  --  32  --  27 23  ALT 14 15  --  15  --  15 15  ALKPHOS 89 78  --  80  --  87 84  BILITOT 0.3 0.2*  --  0.4  --  0.3 0.5   ------------------------------------------------------------------------------------------------------------------ No results for input(s): CHOL, HDL, LDLCALC, TRIG, CHOLHDL, LDLDIRECT in the last 72 hours.  Lab Results  Component Value Date   HGBA1C 5.9 (H) 04/10/2016   ------------------------------------------------------------------------------------------------------------------ No results for input(s): TSH, T4TOTAL, T3FREE, THYROIDAB in the last 72 hours.  Invalid input(s): FREET3  Cardiac Enzymes No results for input(s): CKMB, TROPONINI, MYOGLOBIN in the last 168 hours.  Invalid input(s):  CK ------------------------------------------------------------------------------------------------------------------    Component Value Date/Time   BNP 86.4 10/30/2018 0505   BNP 310.6 (H) 01/20/2016 1435    Micro Results Recent Results (from the past 240 hour(s))  Blood Culture (routine x 2)     Status: None   Collection Time: 10/12/2018  6:29 AM   Specimen: BLOOD  Result Value Ref Range Status   Specimen Description BLOOD LEFT ARM  Final   Special Requests   Final    BOTTLES DRAWN AEROBIC AND ANAEROBIC Blood Culture results may not be optimal due to an excessive volume of blood received in culture bottles   Culture   Final    NO GROWTH 5 DAYS Performed at [facility], [place] [address] [place], [place] [address]    Report Status 10/30/2018 FINAL  Final  Blood Culture (routine x 2)     Status: None   Collection Time: 09/30/2018  7:29 AM   Specimen: BLOOD  Result Value Ref Range Status   Specimen Description BLOOD SITE NOT SPECIFIED  Final   Special Requests   Final    BOTTLES DRAWN AEROBIC AND ANAEROBIC Blood Culture adequate volume   Culture   Final    NO GROWTH 5 DAYS Performed at [facility], [place] [address] [place], [place] [address]    Report Status 10/30/2018 FINAL  Final  MRSA PCR Screening     Status: None   Collection Time: 10/28/18 11:00 AM   Specimen: Nasal Mucosa; Nasopharyngeal  Result Value Ref Range Status   MRSA by PCR NEGATIVE NEGATIVE Final    Comment:        The GeneXpert MRSA Assay (FDA approved for NASAL specimens only), is one component of a comprehensive MRSA colonization surveillance program. It is not  intended to diagnose MRSA infection nor to guide or monitor treatment for MRSA infections. Performed at [facility], [place] [address], [place], [place] [address]     Radiology Reports Dg Chest Port 1 View  Result Date: 10/28/2018 CLINICAL DATA:  Acute respiratory failure with hypoxemia EXAM: PORTABLE CHEST 1  VIEW COMPARISON:  Two days ago FINDINGS: Marked diffuse worsening of bilateral airspace disease. Cardiomegaly and vascular pedicle widening. Three valves have been repaired previously. Right PICC with tip at the SVC. No pneumothorax. IMPRESSION: Extensive bilateral airspace disease with significant progression from 2 days ago. This could be worsening pneumonia or superimposed edema. Electronically Signed   By: Monte Fantasia M.D.   On: 10/28/2018 05:07   Dg Chest Port 1 View  Result Date: 10/03/2018 CLINICAL DATA:  Cough for 3 days.  Positive COVID-19 EXAM: PORTABLE CHEST 1 VIEW COMPARISON:  12/20/2017 FINDINGS: Borderline heart size. Three valves have been replaced and there is left atrial clipping. Patchy bilateral lung opacity likely related to COVID-19 positivity. No effusion or pneumothorax. Lung volumes are low. IMPRESSION: Bilateral pneumonia in the setting of COVID-19. Electronically Signed   By: Monte Fantasia M.D.   On: 10/08/2018 06:35   Dg Chest Port 1v Same Day  Result Date: 10/26/2018 CLINICAL DATA:  Shortness of breath EXAM: PORTABLE CHEST 1 VIEW COMPARISON:  Yesterday FINDINGS: Improved lung volumes and mild decrease in opacity. There is still extensive patchy bilateral airspace disease in the setting of COVID-19. Densest opacity in the right upper lobe. Postoperative heart with replaced valves. No effusion or pneumothorax. IMPRESSION: Improved lung volumes and aeration, but still extensive pneumonia asymmetric to the right upper lobe. Electronically Signed   By: Monte Fantasia M.D.   On: 10/26/2018 08:45   [place] Ekg Site Rite  Result Date: 10/27/2018 If Site Rite image not attached, placement could not be confirmed due to current cardiac rhythm.

## 2018-10-30 NOTE — Progress Notes (Addendum)
Initial Nutrition Assessment RD working remotely.  DOCUMENTATION CODES:   Not applicable  INTERVENTION:    Peach Boost Breeze po TID, each supplement provides 250 kcal and 9 grams of protein   Recommend liberalizing diet to Regular to help improve intake of meals  NUTRITION DIAGNOSIS:   Increased nutrient needs related to acute illness(COVID) as evidenced by estimated needs.  GOAL:   Patient will meet greater than or equal to 90% of their needs  MONITOR:   PO intake, Supplement acceptance, Labs, Skin  REASON FOR ASSESSMENT:   Rounds(RN reported poor PO intake)    ASSESSMENT:   68 yo female admitted with COVID-19 PNA. PMH includes deafness, CHF, HTN, HLD, IBS, A fib, recent ORIF at [facility] for R femur fx (d/c to SNF).   On-site RD communicated with patient by whiteboard this morning. She has been eating very poorly since admission. She does not like the Ensure, Magic cup, or [employer] Shake supplements, but agreed to try [employer]. She likes fruits.   Currently on a heart healthy diet, consuming 10% of meals.    Labs reviewed. Calcium 8.2 (L)  Medications reviewed and include calcium with vitamin D, Colace, ferrous sulfate, Solu-medrol, Miralax.  I/O net negative 2.1 L Per RN documentation, patient has mild edema to RLE.  NUTRITION - FOCUSED PHYSICAL EXAM:  deferred  Diet Order:   Diet Order            Diet Heart Room service appropriate? Yes; Fluid consistency: Thin  Diet effective now              EDUCATION NEEDS:   No education needs have been identified at this time  Skin:  Skin Assessment: Reviewed RN Assessment(MASD to perineum)  Last BM:  6/29  Height:   Ht Readings from Last 1 Encounters:  10/23/2018 5\' 7"  (1.702 m)    Weight:   Wt Readings from Last 1 Encounters:  10/30/18 83.1 kg    Ideal Body Weight:  61.4 kg  BMI:  Body mass index is 28.69 kg/m.  Estimated Nutritional Needs:   Kcal:   7035-0093  Protein:  90-105 gm  Fluid:  >/= 1.8 L    Molli Barrows, RD, LDN, [place] Pager [phone] After Hours Pager [phone]

## 2018-10-30 NOTE — Progress Notes (Addendum)
Occupational Therapy Treatment Patient Details Name: Allison [name] MRN: [id number] DOB: 09/25/1950 Today's Date: 10/30/2018    History of present illness 68 year old female who has multiple cardiovascular comorbid illnesses including valvular heart disease for which she takes Eliquis, atrial fibrillation who contracted COVID-19 pneumonia and was admitted to our facility for the same.  She had a fall early in June and suffered a rt distal peri-prosthetic femur fx and underwent ORIF [facility].  She was transferred to [facility] rehab.  There she tested positive for COVID-19. Per chart review, 10/21/18 ortho MD office note states NWB for another 4 weeks.    OT comments  Pt seen on 40L 80% FiO2 after PT worked with pt to transfer to chair using lateral scoot. Pt able to stand x 2 with mod A +2 for transition due to BUE and LLE weakness. Once in standing, min A to maintain. Pt completed parital squats in standing x 5. Desat to 83 with minimal SOB. VSS.  Pt able to converse without difficulty and has a great sense of humor. Completed BUE strengthening and further assessed LB ADL. If pt stays in ICU, she will need a drop arm BSC for toileting. Pt encouraged to complete theraband exercises independently. Pt verbalized understanding. Will continue to follow.  Pt convinced the orthopedic doctor told her she could begin to weight bear through her RLE and asked for this therapist to call and get clarification. MD called and clarified that pt is to continue with NWB until follow up visit. Bledsoe brace only needs to be worn during mobility.   Follow Up Recommendations  SNF;Supervision/Assistance - 24 hour    Equipment Recommendations  None recommended by OT(AE)    Recommendations for Other Services      Precautions / Restrictions Precautions Precautions: Fall Precaution Comments: [name] - only during mobility; does not need to have on when in bed Restrictions RLE Weight  Bearing: Non weight bearing       Mobility Bed Mobility                  Transfers                 General transfer comment: OOB in recliner    Balance                                           ADL either performed or assessed with clinical judgement   ADL Overall ADL's : Needs assistance/impaired                     Lower Body Dressing: Moderate assistance;Sitting/lateral leans;Sit to/from stand Lower Body Dressing Details (indicate cue type and reason): difficulty reaching feet. May benefit from adaptive equipment. Pt complains of soreness in L hip form previous surgeryfx               General ADL Comments: Pt interested in seeing/learning how to use AE     Vision       Perception     Praxis      Cognition Arousal/Alertness: Awake/alert Behavior During Therapy: WFL for tasks assessed/performed(great sense of humor) Overall Cognitive Status: No family/caregiver present to determine baseline cognitive functioning  General Comments: Most likely close to baseline        Exercises Exercises: General Upper Extremity General Exercises - Upper Extremity Shoulder Flexion: AROM;Strengthening;Both;10 reps;Seated;Theraband Theraband Level (Shoulder Flexion): Level 1 (Yellow) Shoulder Extension: AROM;Strengthening;Both;10 reps;Seated;Theraband Theraband Level (Shoulder Extension): Level 1 (Yellow) Shoulder ABduction: Strengthening;Both;10 reps;Seated;Theraband Theraband Level (Shoulder Abduction): Level 1 (Yellow) Shoulder Horizontal ABduction: Strengthening;Both;15 reps;Seated;Theraband Theraband Level (Shoulder Horizontal Abduction): Level 1 (Yellow) Elbow Flexion: Strengthening;Both;10 reps;Seated;Theraband Theraband Level (Elbow Flexion): Level 1 (Yellow) Elbow Extension: Strengthening;Both;10 reps;Seated;Theraband Theraband Level (Elbow Extension): Level 1 (Yellow) Chair  Push Up: Strengthening;10 reps(difficulty clearing bottom from chair)   Shoulder Instructions       General Comments      Pertinent Vitals/ Pain       Pain Assessment: Faces Faces Pain Scale: Hurts a little bit Pain Location: L hip when reaching for foot Pain Descriptors / Indicators: Discomfort Pain Intervention(s): Limited activity within patient's tolerance  Home Living                                          Prior Functioning/Environment              Frequency  Min 3X/week        Progress Toward Goals  OT Goals(current goals can now be found in the care plan section)  Progress towards OT goals: Progressing toward goals  Acute Rehab OT Goals Patient Stated Goal: be able to walk again OT Goal Formulation: With patient Time For Goal Achievement: 11/13/18 Potential to Achieve Goals: Good ADL Goals Pt Will Perform Lower Body Bathing: with set-up;with supervision;with adaptive equipment;sitting/lateral leans Pt Will Perform Lower Body Dressing: with supervision;with set-up;with adaptive equipment;sitting/lateral leans Pt Will Transfer to Toilet: squat pivot transfer;bedside commode;with min guard assist Pt Will Perform Toileting - Clothing Manipulation and hygiene: with min guard assist;sitting/lateral leans;with adaptive equipment Pt/caregiver will Perform Home Exercise Program: Increased strength;Both right and left upper extremity;With written HEP provided;With theraband;Independently Additional ADL Goal #1: Pt will complete ADL task with SpO2 remaining above 90  Plan Discharge plan remains appropriate    Co-evaluation    PT/OT/SLP Co-Evaluation/Treatment: Yes(partial session) Reason for Co-Treatment: To address functional/ADL transfers   OT goals addressed during session: ADL's and self-care;Strengthening/ROM      AM-PAC OT "6 Clicks" Daily Activity     Outcome Measure   Help from another person eating meals?: None Help from another  person taking care of personal grooming?: A Little Help from another person toileting, which includes using toliet, bedpan, or urinal?: A Little Help from another person bathing (including washing, rinsing, drying)?: A Lot Help from another person to put on and taking off regular upper body clothing?: A Little Help from another person to put on and taking off regular lower body clothing?: A Lot 6 Click Score: 17    End of Session Equipment Utilized During Treatment: Gait belt;Oxygen(40L)  OT Visit Diagnosis: Unsteadiness on feet (R26.81);Other abnormalities of gait and mobility (R26.89);Muscle weakness (generalized) (M62.81);Pain Pain - Right/Left: Left Pain - part of body: Hip   Activity Tolerance Patient tolerated treatment well   Patient Left in chair;with call bell/phone within reach;with nursing/sitter in room   Nurse Communication Mobility status;Precautions;Weight bearing status        Time: 8182-9937 OT Time Calculation (min): 50 min  Charges: OT General Charges $OT Visit: 1 Visit OT Treatments $Self Care/Home Management : 8-22 mins $Therapeutic Exercise:  8-22 mins  Luisa Dago, OT/L   Acute OT Clinical Specialist Acute Rehabilitation Services Pager [phone] Office [phone]    [facility] 10/30/2018, 10:39 AM

## 2018-10-30 DEATH — deceased

## 2018-10-31 ENCOUNTER — Inpatient Hospital Stay (HOSPITAL_COMMUNITY): Payer: Medicare Other

## 2018-10-31 DIAGNOSIS — I82409 Acute embolism and thrombosis of unspecified deep veins of unspecified lower extremity: Secondary | ICD-10-CM

## 2018-10-31 LAB — CBC WITH DIFFERENTIAL/PLATELET
Abs Immature Granulocytes: 0.13 10*3/uL — ABNORMAL HIGH (ref 0.00–0.07)
Basophils Absolute: 0 10*3/uL (ref 0.0–0.1)
Basophils Relative: 0 %
Eosinophils Absolute: 0 10*3/uL (ref 0.0–0.5)
Eosinophils Relative: 0 %
HCT: 29.6 % — ABNORMAL LOW (ref 36.0–46.0)
Hemoglobin: 8.9 g/dL — ABNORMAL LOW (ref 12.0–15.0)
Immature Granulocytes: 1 %
Lymphocytes Relative: 9 %
Lymphs Abs: 0.9 10*3/uL (ref 0.7–4.0)
MCH: 26.6 pg (ref 26.0–34.0)
MCHC: 30.1 g/dL (ref 30.0–36.0)
MCV: 88.4 fL (ref 80.0–100.0)
Monocytes Absolute: 0.5 10*3/uL (ref 0.1–1.0)
Monocytes Relative: 5 %
Neutro Abs: 9 10*3/uL — ABNORMAL HIGH (ref 1.7–7.7)
Neutrophils Relative %: 85 %
Platelets: 194 10*3/uL (ref 150–400)
RBC: 3.35 MIL/uL — ABNORMAL LOW (ref 3.87–5.11)
RDW: 17.5 % — ABNORMAL HIGH (ref 11.5–15.5)
WBC: 10.6 10*3/uL — ABNORMAL HIGH (ref 4.0–10.5)
nRBC: 0.3 % — ABNORMAL HIGH (ref 0.0–0.2)

## 2018-10-31 LAB — COMPREHENSIVE METABOLIC PANEL
ALT: 14 U/L (ref 0–44)
AST: 26 U/L (ref 15–41)
Albumin: 2.8 g/dL — ABNORMAL LOW (ref 3.5–5.0)
Alkaline Phosphatase: 86 U/L (ref 38–126)
Anion gap: 11 (ref 5–15)
BUN: 21 mg/dL (ref 8–23)
CO2: 29 mmol/L (ref 22–32)
Calcium: 8.1 mg/dL — ABNORMAL LOW (ref 8.9–10.3)
Chloride: 99 mmol/L (ref 98–111)
Creatinine, Ser: 0.73 mg/dL (ref 0.44–1.00)
GFR calc Af Amer: >60 mL/min (ref >60)
GFR calc non Af Amer: >60 mL/min (ref >60)
Glucose, Bld: 110 mg/dL — ABNORMAL HIGH (ref 70–99)
Potassium: 3.5 mmol/L (ref 3.5–5.1)
Sodium: 139 mmol/L (ref 135–145)
Total Bilirubin: 0.2 mg/dL — ABNORMAL LOW (ref 0.3–1.2)
Total Protein: 5.1 g/dL — ABNORMAL LOW (ref 6.5–8.1)

## 2018-10-31 LAB — D-DIMER, QUANTITATIVE: D-Dimer, Quant: 16.08 ug/mL-FEU — ABNORMAL HIGH (ref 0.00–0.50)

## 2018-10-31 LAB — MAGNESIUM: Magnesium: 2.1 mg/dL (ref 1.7–2.4)

## 2018-10-31 LAB — FERRITIN: Ferritin: 266 ng/mL (ref 11–307)

## 2018-10-31 LAB — C-REACTIVE PROTEIN: CRP: 1.4 mg/dL — ABNORMAL HIGH (ref <1.0)

## 2018-10-31 LAB — BRAIN NATRIURETIC PEPTIDE: B Natriuretic Peptide: 68.8 pg/mL (ref 0.0–100.0)

## 2018-10-31 MED ORDER — FERROUS SULFATE 325 (65 FE) MG PO TABS
325.0000 mg | ORAL_TABLET | Freq: Every day | ORAL | Status: DC
  Administered 2018-11-01 – 2018-11-04 (×4): 325 mg via ORAL
  Filled 2018-10-31 (×5): qty 1

## 2018-10-31 MED ORDER — LORAZEPAM 0.5 MG PO TABS
1.0000 mg | ORAL_TABLET | Freq: Once | ORAL | Status: AC
  Administered 2018-10-31: 03:00:00 1 mg via ORAL
  Filled 2018-10-31: qty 2

## 2018-10-31 MED ORDER — FUROSEMIDE 10 MG/ML IJ SOLN
60.0000 mg | Freq: Once | INTRAMUSCULAR | Status: AC
  Administered 2018-10-31: 60 mg via INTRAVENOUS
  Filled 2018-10-31: qty 6

## 2018-10-31 NOTE — Progress Notes (Signed)
NAME:  Allison [name], MRN:  [id number], DOB:  11/05/1950, LOS: 6 ADMISSION DATE:  10/28/2018, CONSULTATION DATE:  10/27/2018 REFERRING MD:  Sloan Leiter, CHIEF COMPLAINT:  Dyspnea   Brief History   68 year old female with hypertension and combined systolic/diastolic heart failure admitted to [facility] on October 25, 2018 from a nursing home in the setting of acute respiratory failure with hypoxemia from COVID-19 pneumonia.  History of present illness   This is a 68 year old female who has multiple cardiovascular comorbid illnesses including valvular heart disease for which she takes Eliquis, atrial fibrillation who contracted COVID-19 pneumonia and was admitted to our facility for the same.  She had a fall early in June and suffered a leg fracture and underwent open reduction and internal fixation of her right distal femur at [facility].  She was transferred to [facility].  There she tested positive for COVID-19.  She presented to [facility] on June 26 with hemoptysis, subjective fever, malaise, some abdominal pain.  After admission she was treated with steroids, remdesivir, Actemra, and convalescent plasma.  She developed worsening hypoxemia with coughing spells and was transferred to the intensive care unit early in the morning on October 27, 2018 for closer observation.   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, mitral valve repair, aortic valve replacement Nonischemic cardiomyopathy Patent foramen ovale Permanent atrial fibrillation Hypothyroidism Hypertension Hyperlipidemia GERD Fibromyalgia Fatty liver   Significant Hospital Events   6/26 admission 6/28 transfer to ICU for increasing O2 needs  Consults:  PCCM  Procedures:    Significant Diagnostic Tests:  2018 transthoracic echocardiogram showed bioprosthetic aortic and mitral valves, left atrium dilation, LVEF 50 to 55% CXR worsening bilateral  infiltrates compared to 6/27 (personnally interpreted)  Micro Data:  6/24 SARS-COV2> positive 6/26 blood culture > NGTD  Antimicrobials:  6/26 Ceftriaxone > x1 6/26 Azithro > x1 6/26 Remdesivir >  6/26 Actemra x1 627 convalescent plasma   Interim history/subjective:  No complaints. Remains on HFNC with increased FiO2 requirements.  Objective   Blood pressure (!) 122/54, pulse 83, temperature 97.8 F (36.6 C), temperature source Axillary, resp. rate (!) 31, height 5\' 7"  (1.702 m), weight 84 kg, SpO2 (!) 84 %.    FiO2 (%):  [70 %-100 %] 100 %   Intake/Output Summary (Last 24 hours) at 10/31/2018 1153 Last data filed at 10/31/2018 0800 Gross per 24 hour  Intake 240 ml  Output 3100 ml  Net -2860 ml   Filed Weights   10/29/18 0500 10/30/18 0500 10/31/18 0500  Weight: 85.6 kg 83.1 kg 84 kg    Examination:  General:  Sitting up in bed with no distress. Slender woman, appears older than stated age. HENT: NCAT OP clear PULM: diffuse crackles bilaterally. No distress CV: RRR, no mgr, no JVD GI: BS+, soft, nontender MSK: normal bulk and tone Neuro: awake, alert, no distress, MAEW   Resolved Hospital Problem list     Assessment & Plan:  Acute respiratory failure with hypoxemia due to COVID-19 pneumonia Hypoxemia currently worse than symptom of dyspnea Currently showing no signs of ventilatory failure Acute diastolic heart failure leading to acute pulmonary edema Solumedrol, remdesivir per protocol Continue diuresis remains in negative fluid balance. Maintain in ICU Tolerate periods of hypoxemia, goal at rest is greater than 85% SaO2, with movement ideally above 75% Decision for intubation should be based on a change in mental status or physical evidence of ventilatory failure such as nasal flaring,  accessory muscle use, paradoxical breathing Out of bed to chair as able Prone positioning while in bed Continue to administer O2 via heated high flow  History of  aortic/mitral valve replacement, diastolic heart failure. On Eliquis for atrial fibrillation Mild hemoptysis > resolved Diuresis Telemetry Change heparin to eliquis   Goals of care:  Code status changed to DNR following conversation with Dr Kendrick Fries.  Best practice:  Diet: regular diet Pain/Anxiety/Delirium protocol (if indicated): n/a VAP protocol (if indicated): n/a DVT prophylaxis: heparin infusion GI prophylaxis: n/a Glucose control: SSI Mobility: bed rest Code Status: DNR Family Communication: daughter updated 6/30 by Dr Kendrick Fries. Disposition: Potential transfer to floor.  Labs   CBC: Recent Labs  Lab 10/27/18 0255  10/28/18 0030 10/29/18 0306 10/29/18 0450 10/30/18 0505 10/31/18 0200  WBC 4.5  --  5.9  --  6.9 8.5 10.6*  NEUTROABS 3.5  --  4.8  --  5.6 7.2 9.0*  HGB 8.0*   < > 8.6* 9.5* 9.0* 8.8* 8.9*  HCT 27.0*   < > 29.2* 28.0* 29.6* 30.2* 29.6*  MCV 87.7  --  86.9  --  87.3 88.3 88.4  PLT 286  --  312  --  326 259 194   < > = values in this interval not displayed.    Basic Metabolic Panel: Recent Labs  Lab 10/27/18 0255  10/28/18 0030 10/29/18 0306 10/29/18 0450 10/30/18 0505 10/31/18 0200  NA 136   < > 137 137 138 139 139  K 4.1   < > 3.8 3.4* 3.3* 4.3 3.5  CL 101  --  100  --  98 101 99  CO2 28  --  28  --  28 30 29   GLUCOSE 144*  --  117*  --  113* 112* 110*  BUN 18  --  17  --  18 20 21   CREATININE 0.75  --  0.78  --  0.72 0.78 0.73  CALCIUM 8.1*  --  8.1*  --  8.2* 8.2* 8.1*  MG 2.0  --  2.0  --  2.0 2.1 2.1   < > = values in this interval not displayed.   GFR: Estimated Creatinine Clearance: 74 mL/min (by C-G formula based on SCr of 0.73 mg/dL). Recent Labs  Lab 10/11/2018 0628 10/22/2018 0629 10/05/2018 1650  10/28/18 0030 10/29/18 0450 10/30/18 0505 10/31/18 0200  PROCALCITON  --  0.47  --   --   --   --   --   --   WBC  --  3.8*  --    < > 5.9 6.9 8.5 10.6*  LATICACIDVEN 0.9  --  1.0  --   --   --   --   --    < > = values in this  interval not displayed.    Liver Function Tests: Recent Labs  Lab 10/27/18 0255 10/28/18 0030 10/29/18 0450 10/30/18 0505 10/31/18 0200  AST 33 32 27 23 26   ALT 15 15 15 15 14   ALKPHOS 78 80 87 84 86  BILITOT 0.2* 0.4 0.3 0.5 0.2*  PROT 5.3* 5.2* 5.3* 5.3* 5.1*  ALBUMIN 2.4* 2.6* 2.6* 2.8* 2.8*   No results for input(s): LIPASE, AMYLASE in the last 168 hours. No results for input(s): AMMONIA in the last 168 hours.  ABG    Component Value Date/Time   PHART 7.514 (H) 10/29/2018 0306   PCO2ART 38.5 10/29/2018 0306   PO2ART 46.0 (L) 10/29/2018 0306   HCO3 31.1 (H) 10/29/2018  0306   TCO2 32 10/29/2018 0306   ACIDBASEDEF 6.0 (H) 04/14/2016 0531   O2SAT 86.0 10/29/2018 0306     Coagulation Profile: No results for input(s): INR, PROTIME in the last 168 hours.  Cardiac Enzymes: No results for input(s): CKTOTAL, CKMB, CKMBINDEX, TROPONINI in the last 168 hours.  HbA1C: Hgb A1c MFr Bld  Date/Time Value Ref Range Status  04/10/2016 04:26 PM 5.9 (H) 4.8 - 5.6 % Final    Comment:    (NOTE)         Pre-diabetes: 5.7 - 6.4         Diabetes: >6.4         Glycemic control for adults with diabetes: <7.0     CBG: No results for input(s): GLUCAP in the last 168 hours.  COVID-19 Labs  Recent Labs    10/29/18 0450 10/30/18 0505 10/31/18 0200  DDIMER 1.99* 3.49* 16.08*  FERRITIN 350* 238 266  CRP 2.9* 2.0* 1.4*    No results found for: SARSCOV2NAA    Lynnell Catalanavi Kimra Kantor, MD [facility] ICU Physician [facility]  Pager: [phone] Mobile: [phone] After hours: [phone].

## 2018-10-31 NOTE — Progress Notes (Signed)
VASCULAR LAB PRELIMINARY  PRELIMINARY  PRELIMINARY  PRELIMINARY  Bilateral lower extremity venous duplex completed.    Preliminary report:  See CV proc for preliminary results.   Tymara Saur, RVT 10/31/2018, 11:17 AM

## 2018-10-31 NOTE — Progress Notes (Signed)
Paged Dr. Loleta Books via Shea Evans and asked MD for lorazepam per patient's request to help her sleep, also made MD aware that patient is on high flow nasal cannula and non-re-breather to keep o2 sat's up. . MD called back and gave order for 1 mg PO ativan once.

## 2018-10-31 NOTE — Progress Notes (Signed)
Called patient's daughter [name] and updated her on patient's oxygen needs and spoke about patient feeling bored and frustrated. Discussed that ipad is being used to for patient to watch movies and that patient got up and sat in chair for 3 hours today.  All questions answered and now patient communicating with her daughter over face time on nurses work iphone.

## 2018-10-31 NOTE — Progress Notes (Signed)
PROGRESS NOTE                                                                                                                                                                                                             Patient Demographics:    Allison [name], is a 68 y.o. female, DOB - 01-18-1951, MEQ:[id number]  Outpatient Primary MD for the patient is Wenda Low, MD    LOS - 6  Admit date - 10/19/2018    Chief Complaint  Patient presents with  . Shortness of Breath       Brief Narrative  Patient is a 68 y.o. female with PMHx of chronic systolic and diastolic heart failure, atrial fibrillation on Eliquis, s/p bioprosthetic aortic valve replacement, s/p mitral and tricuspid valve repair, closure of patent foreman ovale with maze procedure on 04/13/2016-underwent ORIF for right distal femur fracture at [facility] on 6/7- following which she was discharged to SNF.  She presented to the hospital on 6/26 with approximately 5-6-day history of cough along with minimal hemoptysis, subjective fever, malaise and worsening shortness of breath.  She was subsequently diagnosed with COVID-19-and found to have acute hypoxic respiratory failure secondary to COVID-19 pneumonia.  She was then admitted to the hospitalist service at [facility].   Subjective:   Patient in bed, appears comfortable, denies any headache, no fever, no chest pain or pressure, no shortness of breath , no abdominal pain. No focal weakness.   Assessment  & Plan :     1. Acute Hypoxic Resp. Failure due to Acute Covid 19 Viral Pneumonitis during the ongoing 2020 Covid 19 Pandemic -  Has received maximal treatment.  6/25>> Solu-Medrol 6/25>> Remdesivir 6/25>> Actemra 6/26 >> Plasma  Continues to be severely hypoxic on high flow nasal cannula oxygen and ICU, overall does not appear to be in any distress, continue monitoring with supportive  care, refuses to get prone.  Will encourage to sit up in chair and use flutter valve for pulmonary toiletry.  PCCM on board.  For now continue IV steroids and IV Lasix for diuresis.  FiO2 (%):  [60 %-100 %] 100 %  ABG     Component Value Date/Time   PHART 7.514 (H) 10/29/2018 0306   PCO2ART 38.5 10/29/2018 0306   PO2ART 46.0 (L) 10/29/2018 0306   HCO3 31.1 (  H) 10/29/2018 0306   TCO2 32 10/29/2018 0306   ACIDBASEDEF 6.0 (H) 04/14/2016 0531   O2SAT 86.0 10/29/2018 0306    COVID-19 Labs  Recent Labs    10/29/18 0450 10/30/18 0505 10/31/18 0200  DDIMER 1.99* 3.49* 16.08*  FERRITIN 350* 238 266  CRP 2.9* 2.0* 1.4*    No results found for: SARSCOV2NAA   Hepatic Function Latest Ref Rng & Units 10/31/2018 10/30/2018 10/29/2018  Total Protein 6.5 - 8.1 g/dL 5.1(L) 5.3(L) 5.3(L)  Albumin 3.5 - 5.0 g/dL 2.8(L) 2.8(L) 2.6(L)  AST 15 - 41 U/L '26 23 27  ' ALT 0 - 44 U/L '14 15 15  ' Alk Phosphatase 38 - 126 U/L 86 84 87  Total Bilirubin 0.3 - 1.2 mg/dL 0.2(L) 0.5 0.3        Component Value Date/Time   BNP 68.8 10/31/2018 0200   BNP 310.6 (H) 01/20/2016 1435      2.  Persistent cough related mild bronchial injury causing sputum laced with few streaks of blood - do think this is true hemoptysis this was mild bronchial irritation from persistent cough, this has resolved, resume Eliquis and stop heparin drip.  3.  History of paroxysmal atrial fibrillation status post maze procedure in the past.  Mali vas 2 score will be at least 4.  Resumed on Eliquis from heparin drip, currently in sinus.  4.  Mild acute on chronic diastolic CHF.  EF 55% on last echocardiogram in January 2018.  Proving with diuresis, continue IV Lasix and monitor need daily.  5.  Acute closed displaced periprosthetic fracture of the distal femur involving the right knee replacement-status post ORIF 10/06/18; -continue nonweightbearing in the R.Leg, continue right knee brace, PT and SNF upon DC.  6.  Essential hypertension.   Stable off medications.  7.  History of bioprosthetic aortic valve replacement, mitral valve repair, tricuspid valve repair.  History of PFO closure.  Supportive care.  8.  Hypothyroidism.  On Synthroid.  9.  Deafness.  Supportive care.  10. Underlying history of asthma.  Stable no wheezing.  11. GERD.  On PPI.  12.  Mild constipation.  Bowel regimen changed.  13.  Elevated d-dimer.  Already on anticoagulation but will check lower extremity venous duplex to see if Eliquis needs to be switched to another agent.    Condition - Extremely Guarded  Family Communication  :  None  Code Status :  Full  Diet :   Diet Order            Diet regular Room service appropriate? Yes; Fluid consistency: Thin  Diet effective now               Disposition Plan  :  ICU  Consults  :  PCCM  Procedures  :    PICC - 10/27/18  Lower extremity venous ultrasound ordered 10/31/2018  PUD Prophylaxis : Famotidine  DVT Prophylaxis  :  Eliquis/ Heparin gtt  Lab Results  Component Value Date   PLT 194 10/31/2018    Inpatient Medications  Scheduled Meds: . apixaban  5 mg Oral BID  . atorvastatin  20 mg Oral q1800  . benzonatate  200 mg Oral TID  . calcium-vitamin D  1 tablet Oral BID  . Chlorhexidine Gluconate Cloth  6 each Topical Daily  . clonazepam  0.5 mg Oral BID  . docusate sodium  200 mg Oral BID  . famotidine  20 mg Oral BID  . feeding supplement  1 Container Oral TID  BM  . ferrous sulfate  325 mg Oral Q breakfast  . fluticasone  2 spray Each Nare Daily  . furosemide  60 mg Intravenous Once  . gabapentin  600 mg Oral TID  . levothyroxine  137 mcg Oral QAC breakfast  . loratadine  10 mg Oral Daily  . methylPREDNISolone (SOLU-MEDROL) injection  40 mg Intravenous Q12H  . montelukast  10 mg Oral QPM  . polyethylene glycol  17 g Oral Daily   Continuous Infusions:  PRN Meds:.acetaminophen, bisacodyl, chlorpheniramine-HYDROcodone, diphenhydrAMINE,  guaiFENesin-dextromethorphan, HYDROcodone-acetaminophen, Ipratropium-Albuterol, LORazepam, menthol-cetylpyridinium, [DISCONTINUED] ondansetron **OR** ondansetron (ZOFRAN) IV, simethicone, sodium chloride, tiZANidine  Antibiotics  :    Anti-infectives (From admission, onward)   Start     Dose/Rate Route Frequency Ordered Stop   10/26/18 1600  remdesivir 100 mg in sodium chloride 0.9 % 250 mL IVPB     100 mg 500 mL/hr over 30 Minutes Intravenous Every 24 hours 10/18/2018 1440 10/29/18 1650   10/06/2018 1600  remdesivir 200 mg in sodium chloride 0.9 % 250 mL IVPB     200 mg 500 mL/hr over 30 Minutes Intravenous Once 10/24/2018 1440 09/30/2018 1757   10/07/2018 0830  cefTRIAXone (ROCEPHIN) 2 g in sodium chloride 0.9 % 100 mL IVPB  Status:  Discontinued     2 g 200 mL/hr over 30 Minutes Intravenous Every 24 hours 10/10/2018 0807 10/28/2018 1509   10/08/2018 0830  azithromycin (ZITHROMAX) 500 mg in sodium chloride 0.9 % 250 mL IVPB  Status:  Discontinued     500 mg 250 mL/hr over 60 Minutes Intravenous Every 24 hours 10/14/2018 0807 10/09/2018 1509       Time Spent in minutes  30   Lala Lund M.D on 10/31/2018 at 10:47 AM  To page go to [email or web address] - password [facility]  [facility] -  Office  [phone]    See all Orders from today for further details    Objective:   Vitals:   10/31/18 0400 10/31/18 0500 10/31/18 0600 10/31/18 0840  BP: (!) 119/56 (!) 113/55 (!) 138/58 (!) 122/54  Pulse: 70 73 73 83  Resp: (!) 31 (!) 28 (!) 34 (!) 31  Temp: 97.8 F (36.6 C)     TempSrc: Axillary     SpO2: (!) 74% (!) 78% (!) 86% (!) 84%  Weight:  84 kg    Height:        Wt Readings from Last 3 Encounters:  10/31/18 84 kg  03/19/18 74.8 kg  01/02/18 78.5 kg     Intake/Output Summary (Last 24 hours) at 10/31/2018 1047 Last data filed at 10/31/2018 0800 Gross per 24 hour  Intake 240 ml  Output 3100 ml  Net -2860 ml     Physical Exam  Awake Alert, Oriented X 3, No new F.N deficits, Normal  affect [place].AT,PERRAL Supple Neck,No JVD, No cervical lymphadenopathy appriciated.  Symmetrical Chest wall movement, Good air movement bilaterally, CTAB RRR,No Gallops, Rubs or new Murmurs, No Parasternal Heave +ve B.Sounds, Abd Soft, No tenderness, No organomegaly appriciated, No rebound - guarding or rigidity. No Cyanosis, Clubbing or edema,  R.Hip-Knee bruised, R knee brace in place   Data Review:    CBC Recent Labs  Lab 10/27/18 0255  10/28/18 0030 10/29/18 0306 10/29/18 0450 10/30/18 0505 10/31/18 0200  WBC 4.5  --  5.9  --  6.9 8.5 10.6*  HGB 8.0*   < > 8.6* 9.5* 9.0* 8.8* 8.9*  HCT 27.0*   < > 29.2* 28.0* 29.6* 30.2* 29.6*  PLT 286  --  312  --  326 259 194  MCV 87.7  --  86.9  --  87.3 88.3 88.4  MCH 26.0  --  25.6*  --  26.5 25.7* 26.6  MCHC 29.6*  --  29.5*  --  30.4 29.1* 30.1  RDW 16.5*  --  16.4*  --  16.9* 17.4* 17.5*  LYMPHSABS 0.6*  --  0.7  --  0.8 0.8 0.9  MONOABS 0.3  --  0.4  --  0.5 0.4 0.5  EOSABS 0.0  --  0.0  --  0.0 0.0 0.0  BASOSABS 0.0  --  0.0  --  0.0 0.0 0.0   < > = values in this interval not displayed.    Chemistries  Recent Labs  Lab 10/27/18 0255  10/28/18 0030 10/29/18 0306 10/29/18 0450 10/30/18 0505 10/31/18 0200  NA 136   < > 137 137 138 139 139  K 4.1   < > 3.8 3.4* 3.3* 4.3 3.5  CL 101  --  100  --  98 101 99  CO2 28  --  28  --  '28 30 29  ' GLUCOSE 144*  --  117*  --  113* 112* 110*  BUN 18  --  17  --  '18 20 21  ' CREATININE 0.75  --  0.78  --  0.72 0.78 0.73  CALCIUM 8.1*  --  8.1*  --  8.2* 8.2* 8.1*  MG 2.0  --  2.0  --  2.0 2.1 2.1  AST 33  --  32  --  '27 23 26  ' ALT 15  --  15  --  '15 15 14  ' ALKPHOS 78  --  80  --  87 84 86  BILITOT 0.2*  --  0.4  --  0.3 0.5 0.2*   < > = values in this interval not displayed.   ------------------------------------------------------------------------------------------------------------------ No results for input(s): CHOL, HDL, LDLCALC, TRIG, CHOLHDL, LDLDIRECT in the last 72 hours.   Lab Results  Component Value Date   HGBA1C 5.9 (H) 04/10/2016   ------------------------------------------------------------------------------------------------------------------ No results for input(s): TSH, T4TOTAL, T3FREE, THYROIDAB in the last 72 hours.  Invalid input(s): FREET3  Cardiac Enzymes No results for input(s): CKMB, TROPONINI, MYOGLOBIN in the last 168 hours.  Invalid input(s): CK ------------------------------------------------------------------------------------------------------------------    Component Value Date/Time   BNP 68.8 10/31/2018 0200   BNP 310.6 (H) 01/20/2016 1435    Micro Results Recent Results (from the past 240 hour(s))  Blood Culture (routine x 2)     Status: None   Collection Time: 10/17/2018  6:29 AM   Specimen: BLOOD  Result Value Ref Range Status   Specimen Description BLOOD LEFT ARM  Final   Special Requests   Final    BOTTLES DRAWN AEROBIC AND ANAEROBIC Blood Culture results may not be optimal due to an excessive volume of blood received in culture bottles   Culture   Final    NO GROWTH 5 DAYS Performed at [facility], [place] [address] [place], [place] [address]    Report Status 10/30/2018 FINAL  Final  Blood Culture (routine x 2)     Status: None   Collection Time: 10/20/2018  7:29 AM   Specimen: BLOOD  Result Value Ref Range Status   Specimen Description BLOOD SITE NOT SPECIFIED  Final   Special Requests   Final    BOTTLES DRAWN AEROBIC AND ANAEROBIC Blood Culture adequate volume   Culture  Final    NO GROWTH 5 DAYS Performed at [facility], [place] [address], [place], [place] [address]    Report Status 10/30/2018 FINAL  Final  MRSA PCR Screening     Status: None   Collection Time: 10/28/18 11:00 AM   Specimen: Nasal Mucosa; Nasopharyngeal  Result Value Ref Range Status   MRSA by PCR NEGATIVE NEGATIVE Final    Comment:        The GeneXpert MRSA Assay (FDA approved for NASAL specimens only), is one component of a  comprehensive MRSA colonization surveillance program. It is not intended to diagnose MRSA infection nor to guide or monitor treatment for MRSA infections. Performed at [facility], [place] [address], [place], [place] [address]     Radiology Reports Dg Chest Port 1 View  Result Date: 10/28/2018 CLINICAL DATA:  Acute respiratory failure with hypoxemia EXAM: PORTABLE CHEST 1 VIEW COMPARISON:  Two days ago FINDINGS: Marked diffuse worsening of bilateral airspace disease. Cardiomegaly and vascular pedicle widening. Three valves have been repaired previously. Right PICC with tip at the SVC. No pneumothorax. IMPRESSION: Extensive bilateral airspace disease with significant progression from 2 days ago. This could be worsening pneumonia or superimposed edema. Electronically Signed   By: Monte Fantasia M.D.   On: 10/28/2018 05:07   Dg Chest Port 1 View  Result Date: 10/26/2018 CLINICAL DATA:  Cough for 3 days.  Positive COVID-19 EXAM: PORTABLE CHEST 1 VIEW COMPARISON:  12/20/2017 FINDINGS: Borderline heart size. Three valves have been replaced and there is left atrial clipping. Patchy bilateral lung opacity likely related to COVID-19 positivity. No effusion or pneumothorax. Lung volumes are low. IMPRESSION: Bilateral pneumonia in the setting of COVID-19. Electronically Signed   By: Monte Fantasia M.D.   On: 10/22/2018 06:35   Dg Chest Port 1v Same Day  Result Date: 10/26/2018 CLINICAL DATA:  Shortness of breath EXAM: PORTABLE CHEST 1 VIEW COMPARISON:  Yesterday FINDINGS: Improved lung volumes and mild decrease in opacity. There is still extensive patchy bilateral airspace disease in the setting of COVID-19. Densest opacity in the right upper lobe. Postoperative heart with replaced valves. No effusion or pneumothorax. IMPRESSION: Improved lung volumes and aeration, but still extensive pneumonia asymmetric to the right upper lobe. Electronically Signed   By: Monte Fantasia M.D.   On:  10/26/2018 08:45   [place] Ekg Site Rite  Result Date: 10/27/2018 If Site Rite image not attached, placement could not be confirmed due to current cardiac rhythm.

## 2018-11-01 ENCOUNTER — Inpatient Hospital Stay (HOSPITAL_COMMUNITY): Payer: Medicare Other

## 2018-11-01 LAB — COMPREHENSIVE METABOLIC PANEL
ALT: 13 U/L (ref 0–44)
AST: 26 U/L (ref 15–41)
Albumin: 3.1 g/dL — ABNORMAL LOW (ref 3.5–5.0)
Alkaline Phosphatase: 94 U/L (ref 38–126)
Anion gap: 12 (ref 5–15)
BUN: 22 mg/dL (ref 8–23)
CO2: 32 mmol/L (ref 22–32)
Calcium: 8.3 mg/dL — ABNORMAL LOW (ref 8.9–10.3)
Chloride: 95 mmol/L — ABNORMAL LOW (ref 98–111)
Creatinine, Ser: 0.76 mg/dL (ref 0.44–1.00)
GFR calc Af Amer: >60 mL/min (ref >60)
GFR calc non Af Amer: >60 mL/min (ref >60)
Glucose, Bld: 96 mg/dL (ref 70–99)
Potassium: 3.1 mmol/L — ABNORMAL LOW (ref 3.5–5.1)
Sodium: 139 mmol/L (ref 135–145)
Total Bilirubin: 0.5 mg/dL (ref 0.3–1.2)
Total Protein: 5.3 g/dL — ABNORMAL LOW (ref 6.5–8.1)

## 2018-11-01 LAB — CBC WITH DIFFERENTIAL/PLATELET
Abs Immature Granulocytes: 0.11 10*3/uL — ABNORMAL HIGH (ref 0.00–0.07)
Basophils Absolute: 0 10*3/uL (ref 0.0–0.1)
Basophils Relative: 0 %
Eosinophils Absolute: 0 10*3/uL (ref 0.0–0.5)
Eosinophils Relative: 0 %
HCT: 30.9 % — ABNORMAL LOW (ref 36.0–46.0)
Hemoglobin: 9.1 g/dL — ABNORMAL LOW (ref 12.0–15.0)
Immature Granulocytes: 1 %
Lymphocytes Relative: 8 %
Lymphs Abs: 0.8 10*3/uL (ref 0.7–4.0)
MCH: 26 pg (ref 26.0–34.0)
MCHC: 29.4 g/dL — ABNORMAL LOW (ref 30.0–36.0)
MCV: 88.3 fL (ref 80.0–100.0)
Monocytes Absolute: 0.5 10*3/uL (ref 0.1–1.0)
Monocytes Relative: 5 %
Neutro Abs: 8.4 10*3/uL — ABNORMAL HIGH (ref 1.7–7.7)
Neutrophils Relative %: 86 %
Platelets: 128 10*3/uL — ABNORMAL LOW (ref 150–400)
RBC: 3.5 MIL/uL — ABNORMAL LOW (ref 3.87–5.11)
RDW: 17.6 % — ABNORMAL HIGH (ref 11.5–15.5)
WBC: 9.9 10*3/uL (ref 4.0–10.5)
nRBC: 0.2 % (ref 0.0–0.2)

## 2018-11-01 LAB — D-DIMER, QUANTITATIVE
D-Dimer, Quant: >20 ug/mL-FEU — ABNORMAL HIGH (ref 0.00–0.50)
D-Dimer, Quant: >20 ug/mL-FEU — ABNORMAL HIGH (ref 0.00–0.50)

## 2018-11-01 LAB — MAGNESIUM: Magnesium: 2.1 mg/dL (ref 1.7–2.4)

## 2018-11-01 LAB — BRAIN NATRIURETIC PEPTIDE: B Natriuretic Peptide: 50.5 pg/mL (ref 0.0–100.0)

## 2018-11-01 LAB — C-REACTIVE PROTEIN: CRP: 0.9 mg/dL (ref <1.0)

## 2018-11-01 LAB — FERRITIN: Ferritin: 231 ng/mL (ref 11–307)

## 2018-11-01 MED ORDER — POTASSIUM CHLORIDE 10 MEQ/100ML IV SOLN
10.0000 meq | INTRAVENOUS | Status: AC
  Administered 2018-11-01 (×4): 10 meq via INTRAVENOUS
  Filled 2018-11-01 (×4): qty 100

## 2018-11-01 MED ORDER — FUROSEMIDE 10 MG/ML IJ SOLN
40.0000 mg | Freq: Once | INTRAMUSCULAR | Status: AC
  Administered 2018-11-01: 40 mg via INTRAVENOUS
  Filled 2018-11-01: qty 4

## 2018-11-01 MED ORDER — POTASSIUM CHLORIDE CRYS ER 20 MEQ PO TBCR
40.0000 meq | EXTENDED_RELEASE_TABLET | Freq: Once | ORAL | Status: AC
  Administered 2018-11-01: 40 meq via ORAL
  Filled 2018-11-01: qty 2

## 2018-11-01 MED ORDER — IOHEXOL 350 MG/ML SOLN
100.0000 mL | Freq: Once | INTRAVENOUS | Status: AC | PRN
  Administered 2018-11-01: 100 mL via INTRAVENOUS

## 2018-11-01 NOTE — Progress Notes (Signed)
NAME:  Allison [name], MRN:  [id number], DOB:  1950-06-16, LOS: 7 ADMISSION DATE:  11-Nov-2018, CONSULTATION DATE:  10/27/2018 REFERRING MD:  Sloan Leiter, CHIEF COMPLAINT:  Dyspnea   Brief History   68 year old female with hypertension and combined systolic/diastolic heart failure admitted to [facility] on 2018-11-11 from a nursing home in the setting of acute respiratory failure with hypoxemia from COVID-19 pneumonia.  History of present illness   This is a 68 year old female who has multiple cardiovascular comorbid illnesses including valvular heart disease for which she takes Eliquis, atrial fibrillation who contracted COVID-19 pneumonia and was admitted to our facility for the same.  She had a fall early in June and suffered a leg fracture and underwent open reduction and internal fixation of her right distal femur at [facility].  She was transferred to [facility].  There she tested positive for COVID-19.  She presented to [facility] on June 26 with hemoptysis, subjective fever, malaise, some abdominal pain.  After admission she was treated with steroids, remdesivir, Actemra, and convalescent plasma.  She developed worsening hypoxemia with coughing spells and was transferred to the intensive care unit early in the morning on October 27, 2018 for closer observation.  This morning she says she feels a little short of breath but it is not too bad, perhaps a little better than yesterday.  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, mitral valve repair, aortic valve replacement Nonischemic cardiomyopathy Patent foramen ovale Permanent atrial fibrillation Hypothyroidism Hypertension Hyperlipidemia GERD Fibromyalgia Fatty liver   Significant Hospital Events   6/26 admission 6/28 transfer to ICU for increasing O2 needs  Consults:  PCCM  Procedures:    Significant Diagnostic Tests:  2018 transthoracic  echocardiogram showed bioprosthetic aortic and mitral valves, left atrium dilation, LVEF 50 to 55%  Micro Data:  6/24 SARS-COV2> positive 6/26 blood culture > NGTD  Antimicrobials:  6/26 Ceftriaxone > x1 6/26 Azithro > x1 6/26 Remdesivir >  6/26 Actemra x1 627 convalescent plasma   Interim history/subjective:   More comfortable on 100% NRB mask  Objective   Blood pressure 107/67, pulse 83, temperature 98.3 F (36.8 C), temperature source Oral, resp. rate 20, height 5\' 7"  (1.702 m), weight 83 kg, SpO2 (!) 84 %.    FiO2 (%):  [70 %-100 %] 100 %   Intake/Output Summary (Last 24 hours) at 11/01/2018 0756 Last data filed at 11/01/2018 0400 Gross per 24 hour  Intake 720 ml  Output 2975 ml  Net -2255 ml   Filed Weights   10/30/18 0500 10/31/18 0500 11/01/18 0500  Weight: 83.1 kg 84 kg 83 kg    Examination:  General:  Resting comfortably in chair HENT: NCAT OP clear PULM: Crackle bases B, normal effort CV: RRR, no mgr GI: BS+, soft, nontender MSK: normal bulk and tone Neuro: awake, alert, no distress, MAEW    June 29 chest x-ray images independently reviewed showing worsening infiltrates (interstitial primarily) bilaterally  Resolved Hospital Problem list     Assessment & Plan:  Acute respiratory failure with hypoxemia due to COVID-19 pneumonia Hypoxemia worse 7/2-3 Currently showing no signs of ventilatory failure Acute diastolic heart failure leading to acute pulmonary edema Diurese again today Continue solumedrol Maintain in ICU Tolerate periods of hypoxemia, goal at rest is greater than 85% SaO2, with movement ideally above 75% Decision for intubation should be based on a change in mental status or physical evidence of ventilatory failure such  as nasal flaring, accessory muscle use, paradoxical breathing Out of bed to chair as able Prone positioning while in bed Continue to administer oxygen via NRB mask Agree with checking CT angiogram> could she have PE? W   History of aortic/mitral valve replacement, diastolic heart failure. On Eliquis for atrial fibrillation Mild hemoptysis > resolved Diuresis Telemetry Diuresis Telemetry Continue eliquis for now   Best practice:  Diet: regular diet Pain/Anxiety/Delirium protocol (if indicated): n/a VAP protocol (if indicated): n/a DVT prophylaxis: heparin infusion GI prophylaxis: n/a Glucose control: SSI Mobility: bed rest Code Status: DNR Family Communication: Spoke to Mill[place]ole today, see above Disposition: remain in ICU  Labs   CBC: Recent Labs  Lab 10/28/18 0030 10/29/18 0306 10/29/18 0450 10/30/18 0505 10/31/18 0200 11/01/18 0500  WBC 5.9  --  6.9 8.5 10.6* 9.9  NEUTROABS 4.8  --  5.6 7.2 9.0* 8.4*  HGB 8.6* 9.5* 9.0* 8.8* 8.9* 9.1*  HCT 29.2* 28.0* 29.6* 30.2* 29.6* 30.9*  MCV 86.9  --  87.3 88.3 88.4 88.3  PLT 312  --  326 259 194 128*    Basic Metabolic Panel: Recent Labs  Lab 10/28/18 0030 10/29/18 0306 10/29/18 0450 10/30/18 0505 10/31/18 0200 11/01/18 0500  NA 137 137 138 139 139 139  K 3.8 3.4* 3.3* 4.3 3.5 3.1*  CL 100  --  98 101 99 95*  CO2 28  --  28 30 29  32  GLUCOSE 117*  --  113* 112* 110* 96  BUN 17  --  18 20 21 22   CREATININE 0.78  --  0.72 0.78 0.73 0.76  CALCIUM 8.1*  --  8.2* 8.2* 8.1* 8.3*  MG 2.0  --  2.0 2.1 2.1 2.1   GFR: Estimated Creatinine Clearance: 73.6 mL/min (by C-G formula based on SCr of 0.76 mg/dL). Recent Labs  Lab 05/29/18 1650  10/29/18 0450 10/30/18 0505 10/31/18 0200 11/01/18 0500  WBC  --    < > 6.9 8.5 10.6* 9.9  LATICACIDVEN 1.0  --   --   --   --   --    < > = values in this interval not displayed.    Liver Function Tests: Recent Labs  Lab 10/28/18 0030 10/29/18 0450 10/30/18 0505 10/31/18 0200 11/01/18 0500  AST 32 27 23 26 26   ALT 15 15 15 14 13   ALKPHOS 80 87 84 86 94  BILITOT 0.4 0.3 0.5 0.2* 0.5  PROT 5.2* 5.3* 5.3* 5.1* 5.3*  ALBUMIN 2.6* 2.6* 2.8* 2.8* 3.1*   No results for input(s): LIPASE,  AMYLASE in the last 168 hours. No results for input(s): AMMONIA in the last 168 hours.  ABG    Component Value Date/Time   PHART 7.514 (H) 10/29/2018 0306   PCO2ART 38.5 10/29/2018 0306   PO2ART 46.0 (L) 10/29/2018 0306   HCO3 31.1 (H) 10/29/2018 0306   TCO2 32 10/29/2018 0306   ACIDBASEDEF 6.0 (H) 04/14/2016 0531   O2SAT 86.0 10/29/2018 0306     Coagulation Profile: No results for input(s): INR, PROTIME in the last 168 hours.  Cardiac Enzymes: No results for input(s): CKTOTAL, CKMB, CKMBINDEX, TROPONINI in the last 168 hours.  HbA1C: Hgb A1c MFr Bld  Date/Time Value Ref Range Status  04/10/2016 04:26 PM 5.9 (H) 4.8 - 5.6 % Final    Comment:    (NOTE)         Pre-diabetes: 5.7 - 6.4         Diabetes: >6.4  Glycemic control for adults with diabetes: <7.0     CBG: No results for input(s): GLUCAP in the last 168 hours.   Critical care time: 32 minutes     Heber Yardley, MD [facility] Pager: [phone] Cell: [phone] If no response, call [phone]

## 2018-11-01 NOTE — Progress Notes (Signed)
Attempted video call with husband, [phone]. It is a [employer] accessibility phone and unable to receive text messages. It connects to an interpreter line) He has computer/email but unsure how to connect it. He previously used daughter's cell phone to connect. She is unavailable right now. I expressed my regret at not being able to connect and relayed a few things between the pair. He will get with daughter in the AM for a call and possibly to set up his computer for future calls.

## 2018-11-01 NOTE — Progress Notes (Signed)
Patient only on high flow at this time because she is awake watching tv show on ipad. Over night she was on high flow and non-re-breather mask and o2 sats stayed around mid 80's while she slept. Calm and cooperative this morning with no respiratory distress.

## 2018-11-01 NOTE — Care Management Important Message (Signed)
Important Message  Patient Details  Name: Allison [name] MRN: [id number] Date of Birth: [birth year]   Medicare Important Message Given:  Yes - Important Message mailed due to current National Emergency   Verbal consent obtained due to current National Emergency  Relationship to patient: Child Contact Name: Festus Aloe Call Date: 11/01/18  Time: 1607 Phone: [phone] Outcome: Spoke with contact Important Message mailed to: Emergency contact on file      Gabor Lusk [address] 11/01/2018, 4:08 PM

## 2018-11-01 NOTE — Progress Notes (Signed)
Physical Therapy Treatment Patient Details Name: Allison [name] MRN: [id number] DOB: 12-10-1950 Today's Date: 11/01/2018    History of Present Illness 68 year old female who has multiple cardiovascular comorbid illnesses including valvular heart disease for which she takes Eliquis, atrial fibrillation who contracted COVID-19 pneumonia and was admitted to our facility for the same.  She had a fall early in June and suffered a rt distal peri-prosthetic femur fx and underwent ORIF [facility].  She was transferred to [facility].  There she tested positive for COVID-19. Per chart review, 10/21/18 ortho MD office note states NWB for another 4 weeks.     PT Comments    Patient's sats monitored by finger probe with noted sats down into 70's on NRB(placed by RT). Patient  Assisted to recliner via a scooting transfer. Patient's sats remained in low 80's. RN changed probe to ear lobe and noted sats reading in low 90's and back on heated HFNC(unsure %). Continue mobility efforts  Follow Up Recommendations   snf     Equipment Recommendations  None recommended by PT    Recommendations for Other Services       Precautions / Restrictions Precautions Precaution Comments: /bledsoe brace Restrictions RLE Weight Bearing: Non weight bearing    Mobility  Bed Mobility   Bed Mobility: Supine to Sit     Supine to sit: Min guard     General bed mobility comments: for RLE and balance for safety; also multiple lines/monitors  Transfers Overall transfer level: Needs assistance Equipment used: None Transfers: Lateral/Scoot Transfers           General transfer comment: dropped recliner armrest for lateral scoot, patient dperformed 80% of transfer withcues for no WB on RLE, extra time for trnasitioning with increased SOB. Patient on NRB mask by RT and sats in 77-84%. RR 30. HR 115=  Ambulation/Gait                 Stairs             Wheelchair  Mobility    Modified Rankin (Stroke Patients Only)       Balance                                            Cognition                                              Exercises      General Comments        Pertinent Vitals/Pain Faces Pain Scale: Hurts little more Pain Location: nose Pain Descriptors / Indicators: Burning Pain Intervention(s): Monitored during session    Home Living                      Prior Function            PT Goals (current goals can now be found in the care plan section) Progress towards PT goals: Progressing toward goals    Frequency    Min 2X/week      PT Plan Current plan remains appropriate    Co-evaluation              AM-PAC PT "6 Clicks" Mobility   Outcome Measure  Help  needed turning from your back to your side while in a flat bed without using bedrails?: None Help needed moving from lying on your back to sitting on the side of a flat bed without using bedrails?: None Help needed moving to and from a bed to a chair (including a wheelchair)?: A Little Help needed standing up from a chair using your arms (e.g., wheelchair or bedside chair)?: Total Help needed to walk in hospital room?: Total Help needed climbing 3-5 steps with a railing? : Total 6 Click Score: 14    End of Session Equipment Utilized During Treatment: Oxygen Activity Tolerance: Patient tolerated treatment well;Treatment limited secondary to medical complications (Comment) Patient left: in chair;with call bell/phone within reach;with nursing/sitter in room Nurse Communication: Mobility status;Weight bearing status PT Visit Diagnosis: Muscle weakness (generalized) (M62.81);History of falling (Z91.81)     Time: 1017-5102 PT Time Calculation (min) (ACUTE ONLY): 40 min  Charges:  $Therapeutic Activity: 38-52 mins                     Blanchard Kelch PT Acute Rehabilitation Services Pager [phone] Office  [phone]    Rada Hay 11/01/2018, 2:09 PM

## 2018-11-01 NOTE — Progress Notes (Signed)
RN unable to reach patient's daughter Allison [name] via phone, left voicemail letting her know patient is stable and she could call back with any questions or concerns and we would try to call her again later.

## 2018-11-01 NOTE — Progress Notes (Signed)
PROGRESS NOTE                                                                                                                                                                                                             Patient Demographics:    Allison [name], is a 68 y.o. female, DOB - [birth year], TIW:[id number]  Outpatient Primary MD for the patient is Wenda Low, MD    LOS - 7  Admit date - 10/24/2018    Chief Complaint  Patient presents with   Shortness of Breath       Brief Narrative  Patient is a 68 y.o. 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  She presented to the hospital on 6/26 with approximately 5-6-day history of cough along with minimal hemoptysis, subjective fever, malaise and worsening shortness of breath.  She was subsequently diagnosed with COVID-19-and found to have acute hypoxic respiratory failure secondary to COVID-19 pneumonia.  She was then admitted to the hospitalist service at [facility].   Subjective:   Patient in bed, appears comfortable, denies any headache, no fever, no chest pain or pressure, mild shortness of breath , no abdominal pain. No focal weakness.    Assessment  & Plan :     1. Acute Hypoxic Resp. Failure due to Acute Covid 19 Viral Pneumonitis during the ongoing 2020 Covid 19 Pandemic -  Has received maximal treatment.  6/25>> Solu-Medrol 6/25>> Remdesivir 6/25>> Actemra 6/26 >> Plasma  Continues to be severely hypoxic on high flow nasal cannula oxygen and ICU, overall does not appear to be in any distress, continue monitoring with  supportive care, refuses to get prone.  Will encourage to sit up in chair and use flutter valve for pulmonary toiletry.  PCCM on board.  For now continue IV steroids and IV Lasix for diuresis.  FiO2 (%):  [70 %-100 %] 100 %  ABG     Component Value Date/Time   PHART 7.514 (H) 10/29/2018 0306   PCO2ART 38.5 10/29/2018 0306   PO2ART 46.0 (L) 10/29/2018 0306   HCO3  31.1 (H) 10/29/2018 0306   TCO2 32 10/29/2018 0306   ACIDBASEDEF 6.0 (H) 04/14/2016 0531   O2SAT 86.0 10/29/2018 0306    COVID-19 Labs  Recent Labs    10/30/18 0505 10/31/18 0200 10/31/18 1240 11/01/18 0500  DDIMER 3.49* 16.08* >20.00* >20.00*  FERRITIN 238 266  --  231  CRP 2.0* 1.4*  --  0.9    No results found for: SARSCOV2NAA   Hepatic Function Latest Ref Rng & Units 11/01/2018 10/31/2018 10/30/2018  Total Protein 6.5 - 8.1 g/dL 5.3(L) 5.1(L) 5.3(L)  Albumin 3.5 - 5.0 g/dL 3.1(L) 2.8(L) 2.8(L)  AST 15 - 41 U/L '26 26 23  ' ALT 0 - 44 U/L '13 14 15  ' Alk Phosphatase 38 - 126 U/L 94 86 84  Total Bilirubin 0.3 - 1.2 mg/dL 0.5 0.2(L) 0.5        Component Value Date/Time   BNP 50.5 11/01/2018 0500   BNP 310.6 (H) 01/20/2016 1435      2.  Persistent cough related mild bronchial injury causing sputum laced with few streaks of blood - do think this is true hemoptysis this was mild bronchial irritation from persistent cough, this has resolved, resume Eliquis and stop heparin drip.  3.  History of paroxysmal atrial fibrillation status post maze procedure in the past.  Mali vas 2 score will be at least 4.  Resumed on Eliquis from heparin drip, currently in sinus.  4.  Mild acute on chronic diastolic CHF.  EF 55% on last echocardiogram in January 2018.  Proving with diuresis, continue IV Lasix and monitor need daily.  5.  Acute closed displaced periprosthetic fracture of the distal femur involving the right knee replacement-status post ORIF 10/06/18; -continue nonweightbearing in the R.Leg, continue right knee brace, PT and  SNF upon DC.  6.  Essential hypertension.  Stable off medications.  7.  History of bioprosthetic aortic valve replacement, mitral valve repair, tricuspid valve repair.  History of PFO closure.  Supportive care.  8.  Hypothyroidism.  On Synthroid.  9.  Deafness.  Supportive care.  10. Underlying history of asthma.  Stable no wheezing.  11. GERD.  On PPI.  12.  Mild constipation.  Bowel regimen changed.  13. ++ Elevated d-dimer.  Already on anticoagulation negative lower extremity venous duplex, still has significant hypoxia will check CTA chest.  If positive may need to switch anticoagulation.    Condition - Extremely Guarded  Family Communication  :  None  Code Status :  Full  Diet :   Diet Order            Diet regular Room service appropriate? Yes; Fluid consistency: Thin  Diet effective now               Disposition Plan  :  ICU  Consults  :  PCCM  Procedures  :    PICC - 10/27/18  Lower extremity venous ultrasound ordered 10/31/2018  PUD Prophylaxis : Famotidine  DVT Prophylaxis  :  Eliquis/ Heparin gtt  Lab Results  Component Value Date   PLT 128 (L) 11/01/2018    Inpatient Medications  Scheduled Meds:  apixaban  5 mg Oral BID   atorvastatin  20 mg Oral q1800   benzonatate  200 mg Oral TID   calcium-vitamin D  1 tablet Oral BID   Chlorhexidine Gluconate Cloth  6 each Topical Daily   clonazepam  0.5 mg Oral BID   docusate sodium  200 mg Oral BID   famotidine  20 mg Oral BID   feeding supplement  1 Container Oral TID BM   ferrous sulfate  325 mg Oral Q breakfast   fluticasone  2 spray Each Nare Daily   gabapentin  600 mg Oral TID   levothyroxine  137 mcg Oral QAC breakfast   loratadine  10 mg Oral Daily   methylPREDNISolone (SOLU-MEDROL) injection  40 mg Intravenous Q12H   montelukast  10 mg Oral QPM   polyethylene glycol  17 g Oral Daily   Continuous Infusions:  PRN Meds:.acetaminophen, bisacodyl,  chlorpheniramine-HYDROcodone, diphenhydrAMINE, guaiFENesin-dextromethorphan, HYDROcodone-acetaminophen, Ipratropium-Albuterol, LORazepam, menthol-cetylpyridinium, [DISCONTINUED] ondansetron **OR** ondansetron (ZOFRAN) IV, simethicone, sodium chloride, tiZANidine  Antibiotics  :    Anti-infectives (From admission, onward)   Start     Dose/Rate Route Frequency Ordered Stop   10/26/18 1600  remdesivir 100 mg in sodium chloride 0.9 % 250 mL IVPB     100 mg 500 mL/hr over 30 Minutes Intravenous Every 24 hours 10/23/2018 1440 10/29/18 1650   10/24/2018 1600  remdesivir 200 mg in sodium chloride 0.9 % 250 mL IVPB     200 mg 500 mL/hr over 30 Minutes Intravenous Once 10/23/2018 1440 10/07/2018 1757   10/06/2018 0830  cefTRIAXone (ROCEPHIN) 2 g in sodium chloride 0.9 % 100 mL IVPB  Status:  Discontinued     2 g 200 mL/hr over 30 Minutes Intravenous Every 24 hours 10/21/2018 0807 10/16/2018 1509   10/23/2018 0830  azithromycin (ZITHROMAX) 500 mg in sodium chloride 0.9 % 250 mL IVPB  Status:  Discontinued     500 mg 250 mL/hr over 60 Minutes Intravenous Every 24 hours 10/07/2018 0807 09/30/2018 1509       Time Spent in minutes  [place] M.D on 11/01/2018 at 10:44 AM  To page go to [email or web address] - password [facility]  [facility] -  Office  [phone]    See all Orders from today for further details    Objective:   Vitals:   11/01/18 0800 11/01/18 0814 11/01/18 0900 11/01/18 1000  BP:   (!) 108/53 109/72  Pulse: 88 86 86 86  Resp: 18 (!) '27 16 16  ' Temp: 98.2 F (36.8 C)     TempSrc: Oral     SpO2: (!) 83% (!) 89% 93% (!) 89%  Weight:      Height:        Wt Readings from Last 3 Encounters:  11/01/18 83 kg  03/19/18 74.8 kg  01/02/18 78.5 kg     Intake/Output Summary (Last 24 hours) at 11/01/2018 1044 Last data filed at 11/01/2018 0400 Gross per 24 hour  Intake 480 ml  Output 2475 ml  Net -1995 ml     Physical Exam  Awake Alert, Oriented X 3, No new F.N deficits, Normal  affect [facility].AT,PERRAL Supple Neck,No JVD, No cervical lymphadenopathy appriciated.  Symmetrical Chest wall movement, Good air movement bilaterally, fine rales RRR,No Gallops, Rubs or new Murmurs, No Parasternal Heave +ve B.Sounds, Abd Soft, No tenderness, No organomegaly appriciated, No rebound - guarding or rigidity. No Cyanosis, Clubbing or edema,  R.Hip-Knee bruised, R knee brace in place   Data Review:    CBC Recent Labs  Lab 10/28/18 0030 10/29/18 0306 10/29/18 0450 10/30/18 0505 10/31/18 0200 11/01/18 0500  WBC 5.9  --  6.9 8.5 10.6* 9.9  HGB 8.6* 9.5* 9.0* 8.8* 8.9* 9.1*  HCT 29.2* 28.0* 29.6* 30.2* 29.6* 30.9*  PLT 312  --  326 259 194 128*  MCV 86.9  --  87.3 88.3 88.4 88.3  MCH 25.6*  --  26.5 25.7* 26.6 26.0  MCHC 29.5*  --  30.4 29.1* 30.1 29.4*  RDW 16.4*  --  16.9* 17.4* 17.5* 17.6*  LYMPHSABS 0.7  --  0.8 0.8 0.9 0.8  MONOABS 0.4  --  0.5 0.4 0.5 0.5  EOSABS 0.0  --  0.0 0.0 0.0 0.0  BASOSABS 0.0  --  0.0 0.0 0.0 0.0    Chemistries  Recent Labs  Lab 10/28/18 0030 10/29/18 0306 10/29/18 0450 10/30/18 0505 10/31/18 0200 11/01/18 0500  NA 137 137 138 139 139 139  K 3.8 3.4* 3.3* 4.3 3.5 3.1*  CL 100  --  98 101 99 95*  CO2 28  --  '28 30 29 ' 32  GLUCOSE 117*  --  113* 112* 110* 96  BUN 17  --  '18 20 21 22  ' CREATININE 0.78  --  0.72 0.78 0.73 0.76  CALCIUM 8.1*  --  8.2* 8.2* 8.1* 8.3*  MG 2.0  --  2.0 2.1 2.1 2.1  AST 32  --  '27 23 26 26  ' ALT 15  --  '15 15 14 13  ' ALKPHOS 80  --  87 84 86 94  BILITOT 0.4  --  0.3 0.5 0.2* 0.5   ------------------------------------------------------------------------------------------------------------------ No results for input(s): CHOL, HDL, LDLCALC, TRIG, CHOLHDL, LDLDIRECT in the last 72 hours.  Lab Results  Component Value Date   HGBA1C 5.9 (H) 04/10/2016   ------------------------------------------------------------------------------------------------------------------ No results for input(s): TSH, T4TOTAL,  T3FREE, THYROIDAB in the last 72 hours.  Invalid input(s): FREET3  Cardiac Enzymes No results for input(s): CKMB, TROPONINI, MYOGLOBIN in the last 168 hours.  Invalid input(s): CK ------------------------------------------------------------------------------------------------------------------    Component Value Date/Time   BNP 50.5 11/01/2018 0500   BNP 310.6 (H) 01/20/2016 1435    Micro Results Recent Results (from the past 240 hour(s))  Blood Culture (routine x 2)     Status: None   Collection Time: 10/17/2018  6:29 AM   Specimen: BLOOD  Result Value Ref Range Status   Specimen Description BLOOD LEFT ARM  Final   Special Requests   Final    BOTTLES DRAWN AEROBIC AND ANAEROBIC Blood Culture results may not be optimal due to an excessive volume of blood received in culture bottles   Culture   Final    NO GROWTH 5 DAYS Performed at [facility], [place] [address], [place], [place] [address]    Report Status 10/30/2018 FINAL  Final  Blood Culture (routine x 2)     Status: None   Collection Time: 10/04/2018  7:29 AM   Specimen: BLOOD  Result Value Ref Range Status   Specimen Description BLOOD SITE NOT SPECIFIED  Final   Special Requests   Final    BOTTLES DRAWN AEROBIC AND ANAEROBIC Blood Culture adequate volume   Culture   Final    NO GROWTH 5 DAYS Performed at [facility], [place] [address] [place], [place] [address]    Report Status 10/30/2018 FINAL  Final  MRSA PCR Screening     Status: None   Collection Time: 10/28/18 11:00 AM   Specimen: Nasal Mucosa; Nasopharyngeal  Result Value Ref Range Status   MRSA by PCR NEGATIVE NEGATIVE Final    Comment:        The GeneXpert MRSA Assay (FDA approved for NASAL specimens only), is one component of a comprehensive MRSA colonization surveillance program. It is not intended to diagnose MRSA  infection nor to guide or monitor treatment for MRSA infections. Performed at [facility], [place] [address] [place], [place] [address]     Radiology Reports Dg Chest Port 1 View  Result Date: 10/28/2018 CLINICAL DATA:  Acute respiratory failure with hypoxemia EXAM: PORTABLE CHEST 1 VIEW COMPARISON:  Two days ago FINDINGS: Marked diffuse worsening of bilateral airspace disease. Cardiomegaly and vascular pedicle widening. Three valves have been repaired previously. Right PICC with tip at the SVC. No pneumothorax. IMPRESSION: Extensive bilateral airspace disease with significant progression from 2 days ago. This could be worsening pneumonia or superimposed edema. Electronically Signed   By: Monte Fantasia M.D.   On: 10/28/2018 05:07   Dg Chest Port 1 View  Result Date: 10/14/2018 CLINICAL DATA:  Cough for 3 days.  Positive COVID-19 EXAM: PORTABLE CHEST 1 VIEW COMPARISON:  12/20/2017 FINDINGS: Borderline heart size. Three valves have been replaced and there is left atrial clipping. Patchy bilateral lung opacity likely related to COVID-19 positivity. No effusion or pneumothorax. Lung volumes are low. IMPRESSION: Bilateral pneumonia in the setting of COVID-19. Electronically Signed   By: Monte Fantasia M.D.   On: 10/11/2018 06:35   Dg Chest Port 1v Same Day  Result Date: 10/26/2018 CLINICAL DATA:  Shortness of breath EXAM: PORTABLE CHEST 1 VIEW COMPARISON:  Yesterday FINDINGS: Improved lung volumes and mild decrease in opacity. There is still extensive patchy bilateral airspace disease in the setting of COVID-19. Densest opacity in the right upper lobe. Postoperative heart with replaced valves. No effusion or pneumothorax. IMPRESSION: Improved lung volumes and aeration, but still extensive pneumonia asymmetric to the right upper lobe. Electronically Signed   By: Monte Fantasia M.D.   On: 10/26/2018 08:45   Vas [place] Lower Extremity Venous (dvt)  Result Date: 10/31/2018  Lower Venous Study Indications: Covid +.  Limitations: Patient with recent leg fracture and ORIF. Comparison Study: Negative study from  10/05/2017 is available Performing Technologist: Sharion Dove RVS  Examination Guidelines: A complete evaluation includes B-mode imaging, spectral Doppler, color Doppler, and power Doppler as needed of all accessible portions of each vessel. Bilateral testing is considered an integral part of a complete examination. Limited examinations for reoccurring indications may be performed as noted.  +---------+---------------+---------+-----------+----------+-------+  RIGHT     Compressibility Phasicity Spontaneity Properties Summary  +---------+---------------+---------+-----------+----------+-------+  CFV       Full            Yes       Yes                             +---------+---------------+---------+-----------+----------+-------+  SFJ       Full                                                      +---------+---------------+---------+-----------+----------+-------+  FV Prox   Full                                                      +---------+---------------+---------+-----------+----------+-------+  FV Mid    Full                                                      +---------+---------------+---------+-----------+----------+-------+  FV Distal Full                                                      +---------+---------------+---------+-----------+----------+-------+  PFV       Full                                                      +---------+---------------+---------+-----------+----------+-------+  POP                       Yes       Yes                             +---------+---------------+---------+-----------+----------+-------+  PTV       Full                                                      +---------+---------------+---------+-----------+----------+-------+  PERO      Full                                                      +---------+---------------+---------+-----------+----------+-------+   +---------+---------------+---------+-----------+----------+-------+  LEFT       Compressibility Phasicity Spontaneity Properties Summary  +---------+---------------+---------+-----------+----------+-------+  CFV       Full            Yes       Yes                             +---------+---------------+---------+-----------+----------+-------+  SFJ       Full                                                      +---------+---------------+---------+-----------+----------+-------+  FV Prox   Full                                                      +---------+---------------+---------+-----------+----------+-------+  FV Mid    Full                                                      +---------+---------------+---------+-----------+----------+-------+  FV Distal Full                                                      +---------+---------------+---------+-----------+----------+-------+  PFV       Full                                                      +---------+---------------+---------+-----------+----------+-------+  POP                       Yes       Yes                             +---------+---------------+---------+-----------+----------+-------+  PTV       Full                                                      +---------+---------------+---------+-----------+----------+-------+  PERO      Full                                                      +---------+---------------+---------+-----------+----------+-------+     Summary: Right: There is no evidence of deep vein thrombosis in the lower extremity. However, portions of this examination were limited- see technologist comments above. Left: There is no evidence of deep vein thrombosis in the lower extremity. However, portions of this examination were limited- see technologist comments above.  *See table(s) above for measurements and observations. Electronically signed by Monica Martinez MD on 10/31/2018 at 2:21:27 PM.    Final    [place] Ekg Site Rite  Result Date: 10/27/2018 If [facility] image not attached, placement could not be  confirmed due to current cardiac rhythm.

## 2018-11-02 LAB — COMPREHENSIVE METABOLIC PANEL
ALT: 14 U/L (ref 0–44)
AST: 29 U/L (ref 15–41)
Albumin: 3.1 g/dL — ABNORMAL LOW (ref 3.5–5.0)
Alkaline Phosphatase: 111 U/L (ref 38–126)
Anion gap: 11 (ref 5–15)
BUN: 19 mg/dL (ref 8–23)
CO2: 31 mmol/L (ref 22–32)
Calcium: 8.3 mg/dL — ABNORMAL LOW (ref 8.9–10.3)
Chloride: 94 mmol/L — ABNORMAL LOW (ref 98–111)
Creatinine, Ser: 0.69 mg/dL (ref 0.44–1.00)
GFR calc Af Amer: >60 mL/min (ref >60)
GFR calc non Af Amer: >60 mL/min (ref >60)
Glucose, Bld: 95 mg/dL (ref 70–99)
Potassium: 3.6 mmol/L (ref 3.5–5.1)
Sodium: 136 mmol/L (ref 135–145)
Total Bilirubin: 0.9 mg/dL (ref 0.3–1.2)
Total Protein: 5.8 g/dL — ABNORMAL LOW (ref 6.5–8.1)

## 2018-11-02 LAB — CBC WITH DIFFERENTIAL/PLATELET
Abs Immature Granulocytes: 0.11 10*3/uL — ABNORMAL HIGH (ref 0.00–0.07)
Basophils Absolute: 0 10*3/uL (ref 0.0–0.1)
Basophils Relative: 0 %
Eosinophils Absolute: 0.1 10*3/uL (ref 0.0–0.5)
Eosinophils Relative: 1 %
HCT: 32.8 % — ABNORMAL LOW (ref 36.0–46.0)
Hemoglobin: 10 g/dL — ABNORMAL LOW (ref 12.0–15.0)
Immature Granulocytes: 1 %
Lymphocytes Relative: 6 %
Lymphs Abs: 0.6 10*3/uL — ABNORMAL LOW (ref 0.7–4.0)
MCH: 27 pg (ref 26.0–34.0)
MCHC: 30.5 g/dL (ref 30.0–36.0)
MCV: 88.6 fL (ref 80.0–100.0)
Monocytes Absolute: 0.4 10*3/uL (ref 0.1–1.0)
Monocytes Relative: 4 %
Neutro Abs: 10.1 10*3/uL — ABNORMAL HIGH (ref 1.7–7.7)
Neutrophils Relative %: 88 %
Platelets: 77 10*3/uL — ABNORMAL LOW (ref 150–400)
RBC: 3.7 MIL/uL — ABNORMAL LOW (ref 3.87–5.11)
RDW: 17.9 % — ABNORMAL HIGH (ref 11.5–15.5)
WBC: 11.4 10*3/uL — ABNORMAL HIGH (ref 4.0–10.5)
nRBC: 0.2 % (ref 0.0–0.2)

## 2018-11-02 LAB — D-DIMER, QUANTITATIVE: D-Dimer, Quant: >20 ug/mL-FEU — ABNORMAL HIGH (ref 0.00–0.50)

## 2018-11-02 LAB — C-REACTIVE PROTEIN: CRP: 0.8 mg/dL (ref <1.0)

## 2018-11-02 LAB — BRAIN NATRIURETIC PEPTIDE: B Natriuretic Peptide: 42 pg/mL (ref 0.0–100.0)

## 2018-11-02 LAB — FERRITIN: Ferritin: 199 ng/mL (ref 11–307)

## 2018-11-02 LAB — MAGNESIUM: Magnesium: 2.3 mg/dL (ref 1.7–2.4)

## 2018-11-02 MED ORDER — LIP MEDEX EX OINT
TOPICAL_OINTMENT | CUTANEOUS | Status: DC | PRN
  Filled 2018-11-02: qty 7

## 2018-11-02 MED ORDER — WHITE PETROLATUM EX OINT: TOPICAL_OINTMENT | CUTANEOUS | Status: DC | PRN

## 2018-11-02 NOTE — Progress Notes (Signed)
Update given to patient's daughter via the telephone.

## 2018-11-02 NOTE — Progress Notes (Signed)
NAME:  Allison [name], MRN:  [id number], DOB:  03-27-1951, LOS: 8 ADMISSION DATE:  2018-11-19, CONSULTATION DATE:  10/27/2018 REFERRING MD:  Jerral Ralph, CHIEF COMPLAINT:  Dyspnea   Brief History   68 year old female with hypertension and combined systolic/diastolic heart failure admitted to [facility] on 19-Nov-2018 from a nursing home in the setting of acute respiratory failure with hypoxemia from COVID-19 pneumonia.  History of present illness   This is a 68 year old female who has multiple cardiovascular comorbid illnesses including valvular heart disease for which she takes Eliquis, atrial fibrillation who contracted COVID-19 pneumonia and was admitted to our facility for the same.  She had a fall early in June and suffered a leg fracture and underwent open reduction and internal fixation of her right distal femur at [facility].  She was transferred to [facility].  There she tested positive for COVID-19.  She presented to [facility] on June 26 with hemoptysis, subjective fever, malaise, some abdominal pain.  After admission she was treated with steroids, remdesivir, Actemra, and convalescent plasma.  She developed worsening hypoxemia with coughing spells and was transferred to the intensive care unit early in the morning on October 27, 2018 for closer observation.  This morning she says she feels a little short of breath but it is not too bad, perhaps a little better than yesterday.  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, mitral valve repair, aortic valve replacement Nonischemic cardiomyopathy Patent foramen ovale Permanent atrial fibrillation Hypothyroidism Hypertension Hyperlipidemia GERD Fibromyalgia Fatty liver   Significant Hospital Events   6/26 admission 6/28 transfer to ICU for increasing O2 needs, mild hemoptysis, briefly changed from eliquis to heparin (24 hours) 7/2 Goals of care discussion  with daughter and patient: DNR, she doesn't want to go on life support 7/3-7/4 slow decline in oxygenation, denies dyspnea, diuresing  Consults:  PCCM  Procedures:    Significant Diagnostic Tests:  2018 transthoracic echocardiogram showed bioprosthetic aortic and mitral valves, left atrium dilation, LVEF 50 to 55% 7/3 CT angiogram chest> no pulmonary embolism, diffuse airspace disease bilaterally, trace pleural effusions  Micro Data:  6/24 SARS-COV2> positive 6/26 blood culture > NGTD  Antimicrobials:  6/26 Ceftriaxone > x1 6/26 Azithro > x1 6/26 Remdesivir >  6/26 Actemra x1 627 convalescent plasma  Interim history/subjective:   Diuresed well yesterday Remains hypoxemic, denies dyspnea Says she is profoundly "bored".  Objective   Blood pressure 119/68, pulse 92, temperature 98.2 F (36.8 C), temperature source Oral, resp. rate (!) 27, height 5\' 7"  (1.702 m), weight 82.8 kg, SpO2 (!) 78 %.    FiO2 (%):  [100 %] 100 %   Intake/Output Summary (Last 24 hours) at 11/02/2018 0753 Last data filed at 11/02/2018 7290 Gross per 24 hour  Intake 798.28 ml  Output 4050 ml  Net -3251.72 ml   Filed Weights   10/31/18 0500 11/01/18 0500 11/02/18 0500  Weight: 84 kg 83 kg 82.8 kg    Examination:  General:  Resting comfortably in bed HENT: NCAT OP clear PULM: Crackles bases B, normal effort CV: RRR, no mgr GI: BS+, soft, nontender MSK: normal bulk and tone Neuro: awake, alert, no distress, MAEW   June 29 chest x-ray images independently reviewed showing worsening infiltrates (interstitial primarily) bilaterally  Resolved Hospital Problem list   Mild hemoptysis > resolved  Assessment & Plan:  Acute respiratory failure with hypoxemia due to COVID-19 pneumonia Hypoxemia has been slowly worsening over  7/1-7/4 Currently showing no signs of ventilatory failure Acute diastolic heart failure leading to acute pulmonary edema > euvolemic on exam 7/4 Hold diuresis today Continue  Solu-Medrol Maintain in ICU Continue to watch fluid status closely Continue to administer oxygen via heated high flow Tolerate periods of hypoxemia, goal at rest is greater than 85% SaO2, with movement ideally above 75% Decision for intubation should be based on a change in mental status or physical evidence of ventilatory failure such as nasal flaring, accessory muscle use, paradoxical breathing Out of bed to chair as able Prone positioning while in bed  History of aortic/mitral valve replacement, diastolic heart failure. On Eliquis for atrial fibrillation Telemetry Continue Eliquis   Best practice:  Diet: regular diet Pain/Anxiety/Delirium protocol (if indicated): n/a VAP protocol (if indicated): n/a DVT prophylaxis: heparin infusion GI prophylaxis: n/a Glucose control: SSI Mobility: bed rest Code Status: DNR Family Communication: I called her daughter [name] for an update, had to leave a detailed message Disposition: remain in ICU  Labs   CBC: Recent Labs  Lab 10/28/18 0030 10/29/18 0306 10/29/18 0450 10/30/18 0505 10/31/18 0200 11/01/18 0500  WBC 5.9  --  6.9 8.5 10.6* 9.9  NEUTROABS 4.8  --  5.6 7.2 9.0* 8.4*  HGB 8.6* 9.5* 9.0* 8.8* 8.9* 9.1*  HCT 29.2* 28.0* 29.6* 30.2* 29.6* 30.9*  MCV 86.9  --  87.3 88.3 88.4 88.3  PLT 312  --  326 259 194 128*    Basic Metabolic Panel: Recent Labs  Lab 10/29/18 0450 10/30/18 0505 10/31/18 0200 11/01/18 0500 11/02/18 0515  NA 138 139 139 139 136  K 3.3* 4.3 3.5 3.1* 3.6  CL 98 101 99 95* 94*  CO2 28 30 29  32 31  GLUCOSE 113* 112* 110* 96 95  BUN 18 20 21 22 19   CREATININE 0.72 0.78 0.73 0.76 0.69  CALCIUM 8.2* 8.2* 8.1* 8.3* 8.3*  MG 2.0 2.1 2.1 2.1 2.3   GFR: Estimated Creatinine Clearance: 73.4 mL/min (by C-G formula based on SCr of 0.69 mg/dL). Recent Labs  Lab 10/29/18 0450 10/30/18 0505 10/31/18 0200 11/01/18 0500  WBC 6.9 8.5 10.6* 9.9    Liver Function Tests: Recent Labs  Lab 10/29/18 0450  10/30/18 0505 10/31/18 0200 11/01/18 0500 11/02/18 0515  AST 27 23 26 26 29   ALT 15 15 14 13 14   ALKPHOS 87 84 86 94 111  BILITOT 0.3 0.5 0.2* 0.5 0.9  PROT 5.3* 5.3* 5.1* 5.3* 5.8*  ALBUMIN 2.6* 2.8* 2.8* 3.1* 3.1*   No results for input(s): LIPASE, AMYLASE in the last 168 hours. No results for input(s): AMMONIA in the last 168 hours.  ABG    Component Value Date/Time   PHART 7.514 (H) 10/29/2018 0306   PCO2ART 38.5 10/29/2018 0306   PO2ART 46.0 (L) 10/29/2018 0306   HCO3 31.1 (H) 10/29/2018 0306   TCO2 32 10/29/2018 0306   ACIDBASEDEF 6.0 (H) 04/14/2016 0531   O2SAT 86.0 10/29/2018 0306     Coagulation Profile: No results for input(s): INR, PROTIME in the last 168 hours.  Cardiac Enzymes: No results for input(s): CKTOTAL, CKMB, CKMBINDEX, TROPONINI in the last 168 hours.  HbA1C: Hgb A1c MFr Bld  Date/Time Value Ref Range Status  04/10/2016 04:26 PM 5.9 (H) 4.8 - 5.6 % Final    Comment:    (NOTE)         Pre-diabetes: 5.7 - 6.4         Diabetes: >6.4  Glycemic control for adults with diabetes: <7.0     CBG: No results for input(s): GLUCAP in the last 168 hours.   Critical care time: 31 minutes     Heber CarolinaBrent Lenix Benoist, MD [place] [facility] Pager: [phone] Cell: [phone] If no response, call [phone]

## 2018-11-02 NOTE — Progress Notes (Signed)
PROGRESS NOTE                                                                                                                                                                                                             Patient Demographics:    Allison [name], is a 68 y.o. female, DOB - 10/03/1950, ZOX:[id number]  Outpatient Primary MD for the patient is [name], [name], MD    LOS - 8  Chief Complaint  Patient presents with   Shortness of Breath       Brief Narrative: Patient is a 68 y.o. 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  She presented to the hospital on 6/26 with approximately 5-6-day history of cough along with minimal hemoptysis, subjective fever, malaise and worsening shortness of breath.  She was subsequently diagnosed with COVID-19-and found to have acute hypoxic respiratory failure secondary to COVID-19 pneumonia.  She was then admitted to the hospitalist [facility].  In spite of receiving treatment with steroids, Remdesivir, Actemra and convalescent plasma-her hypoxia continued to worsen-and was subsequently transferred to the ICU for further close monitoring.   Subjective:    Allison [name] today remains very hypoxic and on significant high amounts of FiO2.  She however appears comfortable.   Note-rounded with video interpreter via bedside iPad.     Assessment  & Plan :   Acute hypoxemic respiratory failure secondary to COVID-19 pneumonia: Continues to have very high oxygen requirements-but not in any distress.  Has received maximal medical therapy with Actemra,  steroids, Remdesivir and convalescent plasma.  No evidence of volume overload today-does not require diuretics.  COVID-19 Labs:  Recent Labs    10/31/18 0200 10/31/18 1240 11/01/18 0500 11/02/18 0515  DDIMER 16.08* >20.00* >20.00* >20.00*  FERRITIN 266  --  231 199  CRP 1.4*  --  0.9 0.8    COVID-19 Medications: 6/26>> convalescent plasma 6/25>> Solu-Medrol 6/25>> Remdesivir 6/25>> Actemra   Hemoptysis: Suspect secondary to extensive pneumonia/bronchial injury from coughing-resolved with supportive care.  Significantly elevated d-dimer: CTA chest negative for large PE-already  on anticoagulation.  PAF: Remains in sinus rhythm-Eliquis initially held-and was briefly on IV heparin due to hemoptysis-however once hemoptysis resolved-has been restarted on Eliquis.    Chronic combined systolic and diastolic heart failure: Volume status appears stable-do not think patient needs diuretics at this time.  Hypertension: Blood pressure controlled-resume antihypertensives when able  S/p bioprosthetic aortic valve replacement, s/p mitral valve replacement, s/p tricuspid valve repair-PFO closure in 2017  Bronchial asthma: Appears stable-continue bronchodilators and montelukast  Hypothyroidism: Continue with Synthroid  GERD: Continue Pepcid  Recent Right femure fracture: s/p ORIF on 6/7 at [facility]-outpatient ortho note as of 6/22 reviewed-NWB for 4 more weeks-will order PT/OT consult   Deafness: Bedside video/iPad interpreter using sign language utilized  FiO2 (%):  [100 %] 100 %  ABG:    Component Value Date/Time   PHART 7.514 (H) 10/29/2018 0306   PCO2ART 38.5 10/29/2018 0306   PO2ART 46.0 (L) 10/29/2018 0306   HCO3 31.1 (H) 10/29/2018 0306   TCO2 32 10/29/2018 0306   ACIDBASEDEF 6.0 (H) 04/14/2016 0531   O2SAT 86.0 10/29/2018 0306    Condition - Extremely Guarded  Family Communication  : PCCM will update family  Code Status : DNR  Diet :  Diet Order            Diet  regular Room service appropriate? Yes; Fluid consistency: Thin  Diet effective now               Disposition Plan  :  Remain inpatient-remain in the ICU as very high oxygen requirement.  Consults  : None  Procedures  :  None  GI prophylaxis: H2 Blocker  DVT Prophylaxis  : Eliquis  Lab Results  Component Value Date   PLT 77 (L) 11/02/2018    Inpatient Medications  Scheduled Meds:  apixaban  5 mg Oral BID   atorvastatin  20 mg Oral q1800   benzonatate  200 mg Oral TID   calcium-vitamin D  1 tablet Oral BID   Chlorhexidine Gluconate Cloth  6 each Topical Daily   clonazepam  0.5 mg Oral BID   docusate sodium  200 mg Oral BID   famotidine  20 mg Oral BID   feeding supplement  1 Container Oral TID BM   ferrous sulfate  325 mg Oral Q breakfast   fluticasone  2 spray Each Nare Daily   gabapentin  600 mg Oral TID   levothyroxine  137 mcg Oral QAC breakfast   loratadine  10 mg Oral Daily   methylPREDNISolone (SOLU-MEDROL) injection  40 mg Intravenous Q12H   montelukast  10 mg Oral QPM   polyethylene glycol  17 g Oral Daily   Continuous Infusions:  PRN Meds:.acetaminophen, bisacodyl, chlorpheniramine-HYDROcodone, diphenhydrAMINE, guaiFENesin-dextromethorphan, HYDROcodone-acetaminophen, Ipratropium-Albuterol, LORazepam, menthol-cetylpyridinium, [DISCONTINUED] ondansetron **OR** ondansetron (ZOFRAN) IV, simethicone, sodium chloride, tiZANidine  Antibiotics  :    Anti-infectives (From admission, onward)   Start     Dose/Rate Route Frequency Ordered Stop   10/26/18 1600  remdesivir 100 mg in sodium chloride 0.9 % 250 mL IVPB     100 mg 500 mL/hr over 30 Minutes Intravenous Every 24 hours 2018-11-09 1440 10/29/18 1650   11/09/2018 1600  remdesivir 200 mg in sodium chloride 0.9 % 250 mL IVPB     200 mg 500 mL/hr over 30 Minutes Intravenous Once Nov 09, 2018 1440 11-09-18 1757   11-09-2018 0830  cefTRIAXone (ROCEPHIN) 2 g in sodium chloride 0.9 % 100 mL IVPB  Status:   Discontinued     2  g 200 mL/hr over 30 Minutes Intravenous Every 24 hours 10/02/2018 0807 10/26/2018 1509   10/20/2018 0830  azithromycin (ZITHROMAX) 500 mg in sodium chloride 0.9 % 250 mL IVPB  Status:  Discontinued     500 mg 250 mL/hr over 60 Minutes Intravenous Every 24 hours 10/11/2018 0807 10/04/2018 1509       Time Spent in minutes  35    Jeoffrey MassedShanker Ellie Bryand M.D on 11/02/2018 at 12:38 PM  To page go to [email or web address] - use universal password  [facility] -  Office  [phone]  See all Orders from today for further details   Admit date - 10/18/2018    8    Objective:   Vitals:   11/02/18 0500 11/02/18 0600 11/02/18 0730 11/02/18 1136  BP: 109/67 119/68 112/70   Pulse: 90 92 94 (!) 106  Resp: (!) 29 (!) 27 (!) 28 (!) 37  Temp:      TempSrc:      SpO2: (!) 76% (!) 78% (!) 84% (!) 87%  Weight: 82.8 kg     Height:        Wt Readings from Last 3 Encounters:  11/02/18 82.8 kg  03/19/18 74.8 kg  01/02/18 78.5 kg     Intake/Output Summary (Last 24 hours) at 11/02/2018 1238 Last data filed at 11/02/2018 0612 Gross per 24 hour  Intake 678.28 ml  Output 4050 ml  Net -3371.72 ml     Physical Exam General appearance:Awake, alert, not in any distress.  Eyes:no scleral icterus. HEENT: Atraumatic and Normocephalic Neck: supple, no JVD. Resp:Good air entry bilaterally,no rales or rhonchi CVS: S1 S2 regular, no murmurs.  GI: Bowel sounds present, Non tender and not distended with no gaurding, rigidity or rebound. Extremities: B/L Lower Ext shows no edema, both legs are warm to touch Neurology:  Non focal Psychiatric: Normal judgment and insight. Normal mood. Musculoskeletal:No digital cyanosis Skin:No Rash, warm and dry Wounds:N/A   Data Review:    CBC Recent Labs  Lab 10/29/18 0450 10/30/18 0505 10/31/18 0200 11/01/18 0500 11/02/18 0515  WBC 6.9 8.5 10.6* 9.9 11.4*  HGB 9.0* 8.8* 8.9* 9.1* 10.0*  HCT 29.6* 30.2* 29.6* 30.9* 32.8*  PLT 326 259 194 128* 77*    MCV 87.3 88.3 88.4 88.3 88.6  MCH 26.5 25.7* 26.6 26.0 27.0  MCHC 30.4 29.1* 30.1 29.4* 30.5  RDW 16.9* 17.4* 17.5* 17.6* 17.9*  LYMPHSABS 0.8 0.8 0.9 0.8 0.6*  MONOABS 0.5 0.4 0.5 0.5 0.4  EOSABS 0.0 0.0 0.0 0.0 0.1  BASOSABS 0.0 0.0 0.0 0.0 0.0    Chemistries  Recent Labs  Lab 10/29/18 0450 10/30/18 0505 10/31/18 0200 11/01/18 0500 11/02/18 0515  NA 138 139 139 139 136  K 3.3* 4.3 3.5 3.1* 3.6  CL 98 101 99 95* 94*  CO2 28 30 29  32 31  GLUCOSE 113* 112* 110* 96 95  BUN 18 20 21 22 19   CREATININE 0.72 0.78 0.73 0.76 0.69  CALCIUM 8.2* 8.2* 8.1* 8.3* 8.3*  MG 2.0 2.1 2.1 2.1 2.3  AST 27 23 26 26 29   ALT 15 15 14 13 14   ALKPHOS 87 84 86 94 111  BILITOT 0.3 0.5 0.2* 0.5 0.9   ------------------------------------------------------------------------------------------------------------------ No results for input(s): CHOL, HDL, LDLCALC, TRIG, CHOLHDL, LDLDIRECT in the last 72 hours.  Lab Results  Component Value Date   HGBA1C 5.9 (H) 04/10/2016   ------------------------------------------------------------------------------------------------------------------ No results for input(s): TSH, T4TOTAL, T3FREE, THYROIDAB in the last 72 hours.  Invalid input(s): FREET3 ------------------------------------------------------------------------------------------------------------------  Recent Labs    11/01/18 0500 11/02/18 0515  FERRITIN 231 199    Coagulation profile No results for input(s): INR, PROTIME in the last 168 hours.  Recent Labs    11/01/18 0500 11/02/18 0515  DDIMER >20.00* >20.00*    Cardiac Enzymes No results for input(s): CKMB, TROPONINI, MYOGLOBIN in the last 168 hours.  Invalid input(s): CK ------------------------------------------------------------------------------------------------------------------    Component Value Date/Time   BNP 42.0 11/02/2018 0515   BNP 310.6 (H) 01/20/2016 1435    Micro Results Recent Results (from the past 240  hour(s))  Blood Culture (routine x 2)     Status: None   Collection Time: 11/09/2018  6:29 AM   Specimen: BLOOD  Result Value Ref Range Status   Specimen Description BLOOD LEFT ARM  Final   Special Requests   Final    BOTTLES DRAWN AEROBIC AND ANAEROBIC Blood Culture results may not be optimal due to an excessive volume of blood received in culture bottles   Culture   Final    NO GROWTH 5 DAYS Performed at [facility], [address] [place], [place] [address]    Report Status 10/30/2018 FINAL  Final  Blood Culture (routine x 2)     Status: None   Collection Time: 11/16/2018  7:29 AM   Specimen: BLOOD  Result Value Ref Range Status   Specimen Description BLOOD SITE NOT SPECIFIED  Final   Special Requests   Final    BOTTLES DRAWN AEROBIC AND ANAEROBIC Blood Culture adequate volume   Culture   Final    NO GROWTH 5 DAYS Performed at [facility], [address], [place], [place] [address]    Report Status 10/30/2018 FINAL  Final  MRSA PCR Screening     Status: None   Collection Time: 10/28/18 11:00 AM   Specimen: Nasal Mucosa; Nasopharyngeal  Result Value Ref Range Status   MRSA by PCR NEGATIVE NEGATIVE Final    Comment:        The GeneXpert MRSA Assay (FDA approved for NASAL specimens only), is one component of a comprehensive MRSA colonization surveillance program. It is not intended to diagnose MRSA infection nor to guide or monitor treatment for MRSA infections. Performed at [facility], [address], [place], [place] [address]     Radiology Reports Ct Angio Chest Pe W Or Wo Contrast  Result Date: 11/01/2018 CLINICAL DATA:  COVID positive, hypoxic EXAM: CT ANGIOGRAPHY CHEST WITH CONTRAST TECHNIQUE: Multidetector CT imaging of the chest was performed using the standard protocol during bolus administration of intravenous contrast. Multiplanar CT image reconstructions and MIPs were obtained to evaluate the vascular anatomy. CONTRAST:   OMNIPAQUE IOHEXOL 350 MG/ML SOLN COMPARISON:  Chest x-ray 10/28/2018; prior CT scan of the chest 09/25/2017 FINDINGS: Cardiovascular: Limited evaluation of the pulmonary arteries beyond the lobar level secondary to extensive streak artifact from bilateral shoulder arthroplasty prostheses and respiratory motion. No evidence of large central filling defect to suggest acute pulmonary embolus. The main pulmonary artery is enlarged at 3.4 cm. Left atrial appendage ligation clip present. Surgical changes of prior aortic and mitral valve replacement. Normal caliber aorta. Mild cardiomegaly. No pericardial effusion. Mediastinum/Nodes: Limited evaluation of the mediastinum secondary to streak artifact from bilateral shoulder arthroplasties. Increased low right paratracheal lymph node now measuring 1.5 cm compared to 1.3 cm previously. Unremarkable esophagus. Lungs/Pleura: Severe diffuse bilateral ground-glass attenuation airspace opacities with areas of more confluent infiltrate in both lower lobes. Scattered areas of  normal aeration confirms that there is no underlying pulmonary edema. Small right and trace left pleural effusions. No pneumothorax. Upper Abdomen: No acute abnormality. Musculoskeletal: Healed median sternotomy. No acute fracture or aggressive appearing lytic or blastic osseous lesion. Review of the MIP images confirms the above findings. IMPRESSION: 1. Negative for acute pulmonary embolus to the proximal lobar level. Evaluation of the more distal pulmonary arterial tree is limited by a combination of extensive respiratory motion artifact and streak artifact related to bilateral shoulder joint arthroplasty prostheses. 2. Severe bilateral ground-glass and more confluent airspace opacities consistent with severe bilateral viral pneumonia versus ARDS. 3. Small right and trace left pleural effusions. 4. Mild cardiomegaly with evidence of prior aortic and mitral valve replacement. 5. Enlarged main pulmonary artery  at 3.4 cm suggests underlying pulmonary arterial hypertension. 6. Slightly enlarged right paratracheal lymph node is likely reactive. Electronically Signed   By: Malachy Moan M.D.   On: 11/01/2018 13:07   Dg Chest Port 1 View  Result Date: 10/28/2018 CLINICAL DATA:  Acute respiratory failure with hypoxemia EXAM: PORTABLE CHEST 1 VIEW COMPARISON:  Two days ago FINDINGS: Marked diffuse worsening of bilateral airspace disease. Cardiomegaly and vascular pedicle widening. Three valves have been repaired previously. Right PICC with tip at the SVC. No pneumothorax. IMPRESSION: Extensive bilateral airspace disease with significant progression from 2 days ago. This could be worsening pneumonia or superimposed edema. Electronically Signed   By: Marnee Spring M.D.   On: 10/28/2018 05:07   Dg Chest Port 1 View  Result Date: 10/12/2018 CLINICAL DATA:  Cough for 3 days.  Positive COVID-19 EXAM: PORTABLE CHEST 1 VIEW COMPARISON:  12/20/2017 FINDINGS: Borderline heart size. Three valves have been replaced and there is left atrial clipping. Patchy bilateral lung opacity likely related to COVID-19 positivity. No effusion or pneumothorax. Lung volumes are low. IMPRESSION: Bilateral pneumonia in the setting of COVID-19. Electronically Signed   By: Marnee Spring M.D.   On: 10/05/2018 06:35   Dg Chest Port 1v Same Day  Result Date: 10/26/2018 CLINICAL DATA:  Shortness of breath EXAM: PORTABLE CHEST 1 VIEW COMPARISON:  Yesterday FINDINGS: Improved lung volumes and mild decrease in opacity. There is still extensive patchy bilateral airspace disease in the setting of COVID-19. Densest opacity in the right upper lobe. Postoperative heart with replaced valves. No effusion or pneumothorax. IMPRESSION: Improved lung volumes and aeration, but still extensive pneumonia asymmetric to the right upper lobe. Electronically Signed   By: Marnee Spring M.D.   On: 10/26/2018 08:45   Vas [place] Lower Extremity Venous (dvt)  Result  Date: 10/31/2018  Lower Venous Study Indications: Covid +.  Limitations: Patient with recent leg fracture and ORIF. Comparison Study: Negative study from 10/05/2017 is available Performing Technologist: Sherren Kerns RVS  Examination Guidelines: A complete evaluation includes B-mode imaging, spectral Doppler, color Doppler, and power Doppler as needed of all accessible portions of each vessel. Bilateral testing is considered an integral part of a complete examination. Limited examinations for reoccurring indications may be performed as noted.  +---------+---------------+---------+-----------+----------+-------+  RIGHT     Compressibility Phasicity Spontaneity Properties Summary  +---------+---------------+---------+-----------+----------+-------+  CFV       Full            Yes       Yes                             +---------+---------------+---------+-----------+----------+-------+  SFJ       Full                                                      +---------+---------------+---------+-----------+----------+-------+  FV Prox   Full                                                      +---------+---------------+---------+-----------+----------+-------+  FV Mid    Full                                                      +---------+---------------+---------+-----------+----------+-------+  FV Distal Full                                                      +---------+---------------+---------+-----------+----------+-------+  PFV       Full                                                      +---------+---------------+---------+-----------+----------+-------+  POP                       Yes       Yes                             +---------+---------------+---------+-----------+----------+-------+  PTV       Full                                                      +---------+---------------+---------+-----------+----------+-------+  PERO      Full                                                       +---------+---------------+---------+-----------+----------+-------+   +---------+---------------+---------+-----------+----------+-------+  LEFT      Compressibility Phasicity Spontaneity Properties Summary  +---------+---------------+---------+-----------+----------+-------+  CFV       Full            Yes       Yes                             +---------+---------------+---------+-----------+----------+-------+  SFJ       Full                                                      +---------+---------------+---------+-----------+----------+-------+  FV Prox   Full                                                      +---------+---------------+---------+-----------+----------+-------+  FV Mid    Full                                                      +---------+---------------+---------+-----------+----------+-------+  FV Distal Full                                                      +---------+---------------+---------+-----------+----------+-------+  PFV       Full                                                      +---------+---------------+---------+-----------+----------+-------+  POP                       Yes       Yes                             +---------+---------------+---------+-----------+----------+-------+  PTV       Full                                                      +---------+---------------+---------+-----------+----------+-------+  PERO      Full                                                      +---------+---------------+---------+-----------+----------+-------+     Summary: Right: There is no evidence of deep vein thrombosis in the lower extremity. However, portions of this examination were limited- see technologist comments above. Left: There is no evidence of deep vein thrombosis in the lower extremity. However, portions of this examination were limited- see technologist comments above.  *See table(s) above for measurements and observations. Electronically signed by Sherald Hesshristopher  Clark MD on 10/31/2018 at 2:21:27 PM.    Final    [place] Ekg Site Rite  Result Date: 10/27/2018 [facility], placement could not be confirmed due to current cardiac rhythm.

## 2018-11-03 LAB — CBC WITH DIFFERENTIAL/PLATELET
Abs Immature Granulocytes: 0.13 10*3/uL — ABNORMAL HIGH (ref 0.00–0.07)
Basophils Absolute: 0 10*3/uL (ref 0.0–0.1)
Basophils Relative: 0 %
Eosinophils Absolute: 0 10*3/uL (ref 0.0–0.5)
Eosinophils Relative: 0 %
HCT: 31.3 % — ABNORMAL LOW (ref 36.0–46.0)
Hemoglobin: 9.4 g/dL — ABNORMAL LOW (ref 12.0–15.0)
Immature Granulocytes: 1 %
Lymphocytes Relative: 8 %
Lymphs Abs: 0.8 10*3/uL (ref 0.7–4.0)
MCH: 26.8 pg (ref 26.0–34.0)
MCHC: 30 g/dL (ref 30.0–36.0)
MCV: 89.2 fL (ref 80.0–100.0)
Monocytes Absolute: 0.5 10*3/uL (ref 0.1–1.0)
Monocytes Relative: 5 %
Neutro Abs: 8.8 10*3/uL — ABNORMAL HIGH (ref 1.7–7.7)
Neutrophils Relative %: 86 %
Platelets: 50 10*3/uL — ABNORMAL LOW (ref 150–400)
RBC: 3.51 MIL/uL — ABNORMAL LOW (ref 3.87–5.11)
RDW: 18.2 % — ABNORMAL HIGH (ref 11.5–15.5)
WBC: 10.4 10*3/uL (ref 4.0–10.5)
nRBC: 0.3 % — ABNORMAL HIGH (ref 0.0–0.2)

## 2018-11-03 LAB — C-REACTIVE PROTEIN: CRP: 0.8 mg/dL (ref <1.0)

## 2018-11-03 LAB — DIC (DISSEMINATED INTRAVASCULAR COAGULATION)PANEL
D-Dimer, Quant: >20 ug/mL-FEU — ABNORMAL HIGH (ref 0.00–0.50)
Fibrinogen: 146 mg/dL — ABNORMAL LOW (ref 210–475)
INR: 1.6 — ABNORMAL HIGH (ref 0.8–1.2)
Platelets: 42 10*3/uL — ABNORMAL LOW (ref 150–400)
Prothrombin Time: 18.4 seconds — ABNORMAL HIGH (ref 11.4–15.2)
Smear Review: NONE SEEN
aPTT: 24 seconds (ref 24–36)

## 2018-11-03 LAB — COMPREHENSIVE METABOLIC PANEL
ALT: 13 U/L (ref 0–44)
AST: 32 U/L (ref 15–41)
Albumin: 3 g/dL — ABNORMAL LOW (ref 3.5–5.0)
Alkaline Phosphatase: 123 U/L (ref 38–126)
Anion gap: 11 (ref 5–15)
BUN: 23 mg/dL (ref 8–23)
CO2: 30 mmol/L (ref 22–32)
Calcium: 8.4 mg/dL — ABNORMAL LOW (ref 8.9–10.3)
Chloride: 95 mmol/L — ABNORMAL LOW (ref 98–111)
Creatinine, Ser: 0.67 mg/dL (ref 0.44–1.00)
GFR calc Af Amer: >60 mL/min (ref >60)
GFR calc non Af Amer: >60 mL/min (ref >60)
Glucose, Bld: 93 mg/dL (ref 70–99)
Potassium: 3.4 mmol/L — ABNORMAL LOW (ref 3.5–5.1)
Sodium: 136 mmol/L (ref 135–145)
Total Bilirubin: 0.7 mg/dL (ref 0.3–1.2)
Total Protein: 5.5 g/dL — ABNORMAL LOW (ref 6.5–8.1)

## 2018-11-03 LAB — D-DIMER, QUANTITATIVE: D-Dimer, Quant: >20 ug/mL-FEU — ABNORMAL HIGH (ref 0.00–0.50)

## 2018-11-03 LAB — FERRITIN: Ferritin: 166 ng/mL (ref 11–307)

## 2018-11-03 LAB — MAGNESIUM: Magnesium: 2.3 mg/dL (ref 1.7–2.4)

## 2018-11-03 MED ORDER — POTASSIUM CHLORIDE CRYS ER 20 MEQ PO TBCR
40.0000 meq | EXTENDED_RELEASE_TABLET | Freq: Once | ORAL | Status: AC
  Administered 2018-11-03: 09:00:00 40 meq via ORAL
  Filled 2018-11-03: qty 2

## 2018-11-03 MED ORDER — HALOPERIDOL LACTATE 5 MG/ML IJ SOLN
5.0000 mg | Freq: Once | INTRAMUSCULAR | Status: AC
  Administered 2018-11-03: 5 mg via INTRAVENOUS
  Filled 2018-11-03: qty 1

## 2018-11-03 MED ORDER — DEXMEDETOMIDINE HCL IN NACL 400 MCG/100ML IV SOLN
0.4000 ug/kg/h | INTRAVENOUS | Status: DC
  Administered 2018-11-03: 0.4 ug/kg/h via INTRAVENOUS
  Administered 2018-11-04 (×2): 1 ug/kg/h via INTRAVENOUS
  Administered 2018-11-05 (×2): 1.2 ug/kg/h via INTRAVENOUS
  Filled 2018-11-03 (×2): qty 100
  Filled 2018-11-03: qty 200
  Filled 2018-11-03 (×3): qty 100

## 2018-11-03 NOTE — Progress Notes (Signed)
PROGRESS NOTE                                                                                                                                                                                                             Patient Demographics:    Allison [name], is a 68 y.o. female, DOB - 05-17-1950, ZOX:[id number]  Outpatient Primary MD for the patient is [name], MD    LOS - 9  Chief Complaint  Patient presents with   Shortness of Breath       Brief Narrative: Patient is a 68 y.o. 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  She presented to the hospital on 6/26 with approximately 5-6-day history of cough along with minimal hemoptysis, subjective fever, malaise and worsening shortness of breath.  She was subsequently diagnosed with COVID-19-and found to have acute hypoxic respiratory failure secondary to COVID-19 pneumonia.  She was then admitted to the hospitalist service at [facility].  In spite of receiving treatment with steroids, Remdesivir, Actemra and convalescent plasma-her hypoxia continued to worsen-and was subsequently transferred to the ICU for further close monitoring.   Subjective:    Allison [name] today continues to remain on high flow oxygen.  No major issues overnight.  Inquiring about discharge plans.  Note-rounded with video interpreter via bedside iPad.     Assessment  & Plan :   Acute hypoxemic respiratory failure secondary to COVID-19 pneumonia: Continues to have very high oxygen requirements-but is comfortable and not in any distress.  Continue steroids.   Patient is s/p Remdesivir, Actemra and convalescent plasma.  No evidence of volume overload-does not probably furosemide today.  COVID-19 Labs:  Recent Labs    11/01/18 0500 11/02/18 0515 11/03/18 0115 11/03/18 0810  DDIMER >20.00* >20.00* >20.00* >20.00*  FERRITIN 231 199 166  --   CRP 0.9 0.8 0.8  --     COVID-19 Medications: 6/26>> convalescent plasma 6/25>> Solu-Medrol 6/25>> Remdesivir 6/25>> Actemra  Hemoptysis: Suspect secondary to extensive pneumonia/bronchial injury from coughing-resolved with supportive care.  Significantly elevated d-dimer: CTA chest negative for  large PE-lower extremity Doppler negative for DVT-already on anticoagulation.  Thrombocytopenia: Etiology unclear-holding Eliquis for today-could be secondary to COVID 19-awaiting DIC panel.  PAF: Remains in sinus rhythm-Eliquis initially held-and was briefly on IV heparin due to hemoptysis-however once hemoptysis resolved-has been restarted on Eliquis but now on hold due to worsening thrombocytopenia.    Chronic combined systolic and diastolic heart failure: Volume status appears stable-do not think patient needs diuretics at this time.  Hypertension: Blood pressure controlled-resume antihypertensives when able  S/p bioprosthetic aortic valve replacement, s/p mitral valve replacement, s/p tricuspid valve repair-PFO closure in 2017  Bronchial asthma: Appears stable-continue bronchodilators and montelukast  Hypothyroidism: Continue with Synthroid  GERD: Continue Pepcid  Recent Right femure fracture: s/p ORIF on 6/7 at [facility]-outpatient ortho note as of 6/22 reviewed-NWB for 4 more weeks-will order PT/OT consult   Deafness: Bedside video/iPad interpreter using sign language utilized  FiO2 (%):  [100 %] 100 %  ABG:    Component Value Date/Time   PHART 7.514 (H) 10/29/2018 0306   PCO2ART 38.5 10/29/2018 0306   PO2ART 46.0 (L) 10/29/2018 0306   HCO3 31.1 (H) 10/29/2018 0306   TCO2 32 10/29/2018 0306     ACIDBASEDEF 6.0 (H) 04/14/2016 0531   O2SAT 86.0 10/29/2018 0306    Condition - Extremely Guarded  Family Communication  : left message for daughter  Code Status : DNR  Diet :  Diet Order            Diet regular Room service appropriate? Yes; Fluid consistency: Thin  Diet effective now               Disposition Plan  :  Remain inpatient-remain in the ICU as very high oxygen requirement.  Consults  : None  Procedures  :  None  GI prophylaxis: H2 Blocker  DVT Prophylaxis  : Eliquis  Lab Results  Component Value Date   PLT 42 (L) 11/03/2018    Inpatient Medications  Scheduled Meds:  atorvastatin  20 mg Oral q1800   benzonatate  200 mg Oral TID   calcium-vitamin D  1 tablet Oral BID   Chlorhexidine Gluconate Cloth  6 each Topical Daily   clonazepam  0.5 mg Oral BID   docusate sodium  200 mg Oral BID   famotidine  20 mg Oral BID   feeding supplement  1 Container Oral TID BM   ferrous sulfate  325 mg Oral Q breakfast   fluticasone  2 spray Each Nare Daily   gabapentin  600 mg Oral TID   levothyroxine  137 mcg Oral QAC breakfast   loratadine  10 mg Oral Daily   methylPREDNISolone (SOLU-MEDROL) injection  40 mg Intravenous Q12H   montelukast  10 mg Oral QPM   polyethylene glycol  17 g Oral Daily   Continuous Infusions:  PRN Meds:.acetaminophen, bisacodyl, chlorpheniramine-HYDROcodone, diphenhydrAMINE, guaiFENesin-dextromethorphan, HYDROcodone-acetaminophen, Ipratropium-Albuterol, lip balm, LORazepam, menthol-cetylpyridinium, [DISCONTINUED] ondansetron **OR** ondansetron (ZOFRAN) IV, simethicone, sodium chloride, tiZANidine  Antibiotics  :    Anti-infectives (From admission, onward)   Start     Dose/Rate Route Frequency Ordered Stop   10/26/18 1600  remdesivir 100 mg in sodium chloride 0.9 % 250 mL IVPB     100 mg 500 mL/hr over 30 Minutes Intravenous Every 24 hours 2018-10-26 1440 10/29/18 1650   10-26-2018 1600  remdesivir 200 mg in sodium  chloride 0.9 % 250 mL IVPB     200 mg 500 mL/hr over 30 Minutes Intravenous Once 10/26/18 1440 2018-10-26 1757  11/06/18 0830  cefTRIAXone (ROCEPHIN) 2 g in sodium chloride 0.9 % 100 mL IVPB  Status:  Discontinued     2 g 200 mL/hr over 30 Minutes Intravenous Every 24 hours 11-06-18 0807 11-06-18 1509   2018/11/06 0830  azithromycin (ZITHROMAX) 500 mg in sodium chloride 0.9 % 250 mL IVPB  Status:  Discontinued     500 mg 250 mL/hr over 60 Minutes Intravenous Every 24 hours 2018/11/06 0807 11-06-18 1509       Time Spent in minutes  35    Jeoffrey Massed M.D on 11/03/2018 at 1:10 PM  To page go to [email or web address] - use universal password  [facility] -  Office  [phone]  See all Orders from today for further details   Admit date - 11/06/2018    9    Objective:   Vitals:   11/03/18 0800 11/03/18 0900 11/03/18 1000 11/03/18 1155  BP: 107/64 101/68 98/86   Pulse: 91   78  Resp: (!) 27   (!) 53  Temp: 98.1 F (36.7 C)     TempSrc: Oral     SpO2: (!) 81%   (!) 83%  Weight:      Height:        Wt Readings from Last 3 Encounters:  11/03/18 80.7 kg  03/19/18 74.8 kg  01/02/18 78.5 kg     Intake/Output Summary (Last 24 hours) at 11/03/2018 1310 Last data filed at 11/03/2018 0300 Gross per 24 hour  Intake 900 ml  Output 800 ml  Net 100 ml     Physical Exam General appearance:Awake, alert, not in any distress.  Eyes:no scleral icterus. HEENT: Atraumatic and Normocephalic Neck: supple, no JVD. Resp:Good air entry bilaterally-fine basilar rales CVS: S1 S2 regular, no murmurs.  GI: Bowel sounds present, Non tender and not distended with no gaurding, rigidity or rebound. Extremities: B/L Lower Ext shows no edema, both legs are warm to touch Neurology:  Non focal Psychiatric: Normal judgment and insight. Normal mood. Musculoskeletal:No digital cyanosis Skin:No Rash, warm and dry Wounds:N/A   Data Review:    CBC Recent Labs  Lab 10/30/18 0505 10/31/18 0200  11/01/18 0500 11/02/18 0515 11/03/18 0115 11/03/18 0810  WBC 8.5 10.6* 9.9 11.4* 10.4  --   HGB 8.8* 8.9* 9.1* 10.0* 9.4*  --   HCT 30.2* 29.6* 30.9* 32.8* 31.3*  --   PLT 259 194 128* 77* 50* 42*  MCV 88.3 88.4 88.3 88.6 89.2  --   MCH 25.7* 26.6 26.0 27.0 26.8  --   MCHC 29.1* 30.1 29.4* 30.5 30.0  --   RDW 17.4* 17.5* 17.6* 17.9* 18.2*  --   LYMPHSABS 0.8 0.9 0.8 0.6* 0.8  --   MONOABS 0.4 0.5 0.5 0.4 0.5  --   EOSABS 0.0 0.0 0.0 0.1 0.0  --   BASOSABS 0.0 0.0 0.0 0.0 0.0  --     Chemistries  Recent Labs  Lab 10/30/18 0505 10/31/18 0200 11/01/18 0500 11/02/18 0515 11/03/18 0115  NA 139 139 139 136 136  K 4.3 3.5 3.1* 3.6 3.4*  CL 101 99 95* 94* 95*  CO2 30 29 32 31 30  GLUCOSE 112* 110* 96 95 93  BUN CREATININE 0.78 0.73 0.76 0.69 0.67  CALCIUM 8.2* 8.1* 8.3* 8.3* 8.4*  MG 2.1 2.1 2.1 2.3 2.3  AST 32  ALT ALKPHOS 84 86 94 111 123  BILITOT 0.5  0.2* 0.5 0.9 0.7   ------------------------------------------------------------------------------------------------------------------ No results for input(s): CHOL, HDL, LDLCALC, TRIG, CHOLHDL, LDLDIRECT in the last 72 hours.  Lab Results  Component Value Date   HGBA1C 5.9 (H) 04/10/2016   ------------------------------------------------------------------------------------------------------------------ No results for input(s): TSH, T4TOTAL, T3FREE, THYROIDAB in the last 72 hours.  Invalid input(s): FREET3 ------------------------------------------------------------------------------------------------------------------ Recent Labs    11/02/18 0515 11/03/18 0115  FERRITIN 199 166    Coagulation profile Recent Labs  Lab 11/03/18 0810  INR 1.6*    Recent Labs    11/03/18 0115 11/03/18 0810  DDIMER >20.00* >20.00*    Cardiac Enzymes No results for input(s): CKMB, TROPONINI, MYOGLOBIN in the last 168 hours.  Invalid input(s):  CK ------------------------------------------------------------------------------------------------------------------    Component Value Date/Time   BNP 42.0 11/02/2018 0515   BNP 310.6 (H) 01/20/2016 1435    Micro Results Recent Results (from the past 240 hour(s))  Blood Culture (routine x 2)     Status: None   Collection Time: 10/01/2018  6:29 AM   Specimen: BLOOD  Result Value Ref Range Status   Specimen Description BLOOD LEFT ARM  Final   Special Requests   Final    BOTTLES DRAWN AEROBIC AND ANAEROBIC Blood Culture results may not be optimal due to an excessive volume of blood received in culture bottles   Culture   Final    NO GROWTH 5 DAYS Performed at [facility], [address] [place], [place] [address]    Report Status 10/30/2018 FINAL  Final  Blood Culture (routine x 2)     Status: None   Collection Time: 10/26/2018  7:29 AM   Specimen: BLOOD  Result Value Ref Range Status   Specimen Description BLOOD SITE NOT SPECIFIED  Final   Special Requests   Final    BOTTLES DRAWN AEROBIC AND ANAEROBIC Blood Culture adequate volume   Culture   Final    NO GROWTH 5 DAYS Performed at [facility], [address] [place], [place] [address]    Report Status 10/30/2018 FINAL  Final  MRSA PCR Screening     Status: None   Collection Time: 10/28/18 11:00 AM   Specimen: Nasal Mucosa; Nasopharyngeal  Result Value Ref Range Status   MRSA by PCR NEGATIVE NEGATIVE Final    Comment:        The GeneXpert MRSA Assay (FDA approved for NASAL specimens only), is one component of a comprehensive MRSA colonization surveillance program. It is not intended to diagnose MRSA infection nor to guide or monitor treatment for MRSA infections. Performed at [facility], [address] [place], [place] [address]     Radiology Reports Ct Angio Chest Pe W Or Wo Contrast  Result Date: 11/01/2018 CLINICAL DATA:  COVID positive, hypoxic EXAM: CT ANGIOGRAPHY CHEST  WITH CONTRAST TECHNIQUE: Multidetector CT imaging of the chest was performed using the standard protocol during bolus administration of intravenous contrast. Multiplanar CT image reconstructions and MIPs were obtained to evaluate the vascular anatomy. CONTRAST:  100mL OMNIPAQUE IOHEXOL 350 MG/ML SOLN COMPARISON:  Chest x-ray 10/28/2018; prior CT scan of the chest 09/25/2017 FINDINGS: Cardiovascular: Limited evaluation of the pulmonary arteries beyond the lobar level secondary to extensive streak artifact from bilateral shoulder arthroplasty prostheses and respiratory motion. No evidence of large central filling defect to suggest acute pulmonary embolus. The main pulmonary artery is enlarged at 3.4 cm. Left atrial appendage ligation clip present. Surgical changes of prior aortic and mitral valve replacement. Normal caliber aorta. Mild  cardiomegaly. No pericardial effusion. Mediastinum/Nodes: Limited evaluation of the mediastinum secondary to streak artifact from bilateral shoulder arthroplasties. Increased low right paratracheal lymph node now measuring 1.5 cm compared to 1.3 cm previously. Unremarkable esophagus. Lungs/Pleura: Severe diffuse bilateral ground-glass attenuation airspace opacities with areas of more confluent infiltrate in both lower lobes. Scattered areas of normal aeration confirms that there is no underlying pulmonary edema. Small right and trace left pleural effusions. No pneumothorax. Upper Abdomen: No acute abnormality. Musculoskeletal: Healed median sternotomy. No acute fracture or aggressive appearing lytic or blastic osseous lesion. Review of the MIP images confirms the above findings. IMPRESSION: 1. Negative for acute pulmonary embolus to the proximal lobar level. Evaluation of the more distal pulmonary arterial tree is limited by a combination of extensive respiratory motion artifact and streak artifact related to bilateral shoulder joint arthroplasty prostheses. 2. Severe bilateral  ground-glass and more confluent airspace opacities consistent with severe bilateral viral pneumonia versus ARDS. 3. Small right and trace left pleural effusions. 4. Mild cardiomegaly with evidence of prior aortic and mitral valve replacement. 5. Enlarged main pulmonary artery at 3.4 cm suggests underlying pulmonary arterial hypertension. 6. Slightly enlarged right paratracheal lymph node is likely reactive. Electronically Signed   By: Malachy Moan M.D.   On: 11/01/2018 13:07   Dg Chest Port 1 View  Result Date: 10/28/2018 CLINICAL DATA:  Acute respiratory failure with hypoxemia EXAM: PORTABLE CHEST 1 VIEW COMPARISON:  Two days ago FINDINGS: Marked diffuse worsening of bilateral airspace disease. Cardiomegaly and vascular pedicle widening. Three valves have been repaired previously. Right PICC with tip at the SVC. No pneumothorax. IMPRESSION: Extensive bilateral airspace disease with significant progression from 2 days ago. This could be worsening pneumonia or superimposed edema. Electronically Signed   By: Marnee Spring M.D.   On: 10/28/2018 05:07   Dg Chest Port 1 View  Result Date: 11-03-18 CLINICAL DATA:  Cough for 3 days.  Positive COVID-19 EXAM: PORTABLE CHEST 1 VIEW COMPARISON:  12/20/2017 FINDINGS: Borderline heart size. Three valves have been replaced and there is left atrial clipping. Patchy bilateral lung opacity likely related to COVID-19 positivity. No effusion or pneumothorax. Lung volumes are low. IMPRESSION: Bilateral pneumonia in the setting of COVID-19. Electronically Signed   By: Marnee Spring M.D.   On: November 03, 2018 06:35   Dg Chest Port 1v Same Day  Result Date: 10/26/2018 CLINICAL DATA:  Shortness of breath EXAM: PORTABLE CHEST 1 VIEW COMPARISON:  Yesterday FINDINGS: Improved lung volumes and mild decrease in opacity. There is still extensive patchy bilateral airspace disease in the setting of COVID-19. Densest opacity in the right upper lobe. Postoperative heart with  replaced valves. No effusion or pneumothorax. IMPRESSION: Improved lung volumes and aeration, but still extensive pneumonia asymmetric to the right upper lobe. Electronically Signed   By: Marnee Spring M.D.   On: 10/26/2018 08:45   Vas [place] Lower Extremity Venous (dvt)  Result Date: 10/31/2018  Lower Venous Study Indications: Covid +.  Limitations: Patient with recent leg fracture and ORIF. Comparison Study: Negative study from 10/05/2017 is available Performing Technologist: Sherren Kerns RVS  Examination Guidelines: A complete evaluation includes B-mode imaging, spectral Doppler, color Doppler, and power Doppler as needed of all accessible portions of each vessel. Bilateral testing is considered an integral part of a complete examination. Limited examinations for reoccurring indications may be performed as noted.  +---------+---------------+---------+-----------+----------+-------+  RIGHT     Compressibility Phasicity Spontaneity Properties Summary  +---------+---------------+---------+-----------+----------+-------+  CFV       Full  Yes       Yes                             +---------+---------------+---------+-----------+----------+-------+  SFJ       Full                                                      +---------+---------------+---------+-----------+----------+-------+  FV Prox   Full                                                      +---------+---------------+---------+-----------+----------+-------+  FV Mid    Full                                                      +---------+---------------+---------+-----------+----------+-------+  FV Distal Full                                                      +---------+---------------+---------+-----------+----------+-------+  PFV       Full                                                      +---------+---------------+---------+-----------+----------+-------+  POP                       Yes       Yes                              +---------+---------------+---------+-----------+----------+-------+  PTV       Full                                                      +---------+---------------+---------+-----------+----------+-------+  PERO      Full                                                      +---------+---------------+---------+-----------+----------+-------+   +---------+---------------+---------+-----------+----------+-------+  LEFT      Compressibility Phasicity Spontaneity Properties Summary  +---------+---------------+---------+-----------+----------+-------+  CFV       Full            Yes       Yes                             +---------+---------------+---------+-----------+----------+-------+  SFJ       Full                                                      +---------+---------------+---------+-----------+----------+-------+  FV Prox   Full                                                      +---------+---------------+---------+-----------+----------+-------+  FV Mid    Full                                                      +---------+---------------+---------+-----------+----------+-------+  FV Distal Full                                                      +---------+---------------+---------+-----------+----------+-------+  PFV       Full                                                      +---------+---------------+---------+-----------+----------+-------+  POP                       Yes       Yes                             +---------+---------------+---------+-----------+----------+-------+  PTV       Full                                                      +---------+---------------+---------+-----------+----------+-------+  PERO      Full                                                      +---------+---------------+---------+-----------+----------+-------+     Summary: Right: There is no evidence of deep vein thrombosis in the lower extremity. However, portions of this examination were limited- see  technologist comments above. Left: There is no evidence of deep vein thrombosis in the lower extremity. However, portions of this examination were limited- see technologist comments above.  *See table(s) above for measurements and observations. Electronically signed by Sherald Hesshristopher Clark MD on 10/31/2018 at 2:21:27 PM.    Final    [place] Ekg Site Rite  Result Date: 10/27/2018 If [facility] image not attached, placement could not be confirmed due to current cardiac rhythm.

## 2018-11-03 NOTE — Progress Notes (Signed)
   Charge RN reports easy desats Patient refused to prone Patient refused intubation  Plan  - attempt bipap  (LOS 9 days)   SIGNATURE    Dr. Brand Males, M.D., F.C.C.P,  Pulmonary and Critical Care Medicine Staff Physician, [place] Director - Interstitial Lung Disease  Program  Pulmonary [place] at [place], [place], [address]  Pager: [phone], If no answer or between  15:00h - 7:00h: call [phone] Telephone: [phone]  3:07 PM 11/03/2018

## 2018-11-03 NOTE — Progress Notes (Signed)
Patient's daughter, [name] was called and given an update on patient's disposition as well as plan of care. Patient keep pulling off oxygen saying she's tired of wearing it. [name] states she will try to facetime her mom later today to help encourage her to wear the oxygen.

## 2018-11-03 NOTE — Progress Notes (Signed)
NAME:  Allison [name], MRN:  [id number], DOB:  29-Jun-1950, LOS: 9 ADMISSION DATE:  Nov 07, 2018, CONSULTATION DATE:  10/27/2018 REFERRING MD:  Jerral Ralph, CHIEF COMPLAINT:  Dyspnea   Brief History   68 year old female with hypertension and combined systolic/diastolic heart failure admitted to [facility] on 2018/11/07 from a nursing home in the setting of acute respiratory failure with hypoxemia from COVID-19 pneumonia.  History of present illness   This is a 68 year old female who has multiple cardiovascular comorbid illnesses including valvular heart disease for which she takes Eliquis, atrial fibrillation who contracted COVID-19 pneumonia and was admitted to our facility for the same.  She had a fall early in June and suffered a leg fracture and underwent open reduction and internal fixation of her right distal femur at [facility].  She was transferred to [facility].  There she tested positive for COVID-19.  She presented to [facility] on June 26 with hemoptysis, subjective fever, malaise, some abdominal pain.  After admission she was treated with steroids, remdesivir, Actemra, and convalescent plasma.  She developed worsening hypoxemia with coughing spells and was transferred to the intensive care unit early in the morning on October 27, 2018 for closer observation.  This morning she says she feels a little short of breath but it is not too bad, perhaps a little better than yesterday.  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 Remains hypoxemic, denies dyspnea. Says she is profoundly "bored".  Consults:  PCCM  Procedures:    Significant Diagnostic Tests:  2018 transthoracic echocardiogram showed bioprosthetic aortic and mitral valves, left atrium dilation, LVEF 50 to 55% 7/3 CT angiogram chest> no pulmonary embolism, diffuse airspace disease bilaterally, trace pleural effusions  Micro Data:  6/24 SARS-COV2> positive 6/26 blood culture > NGTD  Antimicrobials:  6/26 Ceftriaxone > x1 6/26 Azithro > x1 6/26 Remdesivir >  6/26 Actemra x1 627 convalescent plasma  Interim history/subjective:   November 03, 2018: She is hard of hearing and history taken through sign language interpreter.  Nurses at bedside physician of tried hospitalist report no change.  She is tachypneic but not paradoxical.  She is on high flow nasal cannula.  This is at 100% and 45 L.  Objective   Blood pressure 98/86, pulse (!) 119, temperature 98.1 F (36.7 C), temperature source Oral, resp. rate (!) 28, height 5\' 7"  (1.702 m), weight 80.7 kg, SpO2 (!) 86 %.    FiO2 (%):  [100 %] 100 %   Intake/Output Summary (Last 24 hours) at 11/03/2018 1422 Last data filed at 11/03/2018 0300 Gross per 24 hour  Intake 900 ml  Output 800 ml  Net 100 ml   Filed Weights   11/01/18 0500 11/02/18 0500 11/03/18 0344  Weight: 83 kg 82.8 kg 80.7 kg    Examination:  General: Sitting in the bed.  Respiratory rate 30-40 but not paradoxical hENT: NCAT OP clear PULM: "Happy hypoxemic" with tachypnea and hypoxemia but appears comfortable  CV: RRR,  no mgr GI: BS+, soft, nontender MSK: normal bulk and tone Neuro: awake, alert, no distress, MAEW   LABS    PULMONARY Recent Labs  Lab 10/29/18 0306  PHART 7.514*  PCO2ART 38.5  PO2ART 46.0*  HCO3 31.1*  TCO2 32  O2SAT 86.0    CBC  Recent Labs  Lab 11/01/18 0500 11/02/18 0515 11/03/18 0115 11/03/18 0810  HGB 9.1* 10.0* 9.4*  --   HCT 30.9* 32.8* 31.3*  --   WBC 9.9 11.4* 10.4  --   PLT 128* 77* 50* 42*    COAGULATION Recent Labs  Lab 11/03/18 0810  INR 1.6*    CARDIAC  No results for input(s): TROPONINI in the last 168 hours. No results for input(s): PROBNP in the last 168 hours.   CHEMISTRY Recent Labs  Lab 10/30/18 0505 10/31/18 0200 11/01/18 0500 11/02/18 0515 11/03/18 0115  NA 139 139 139 136 136  K 4.3 3.5 3.1* 3.6 3.4*  CL 101 99 95* 94* 95*  CO2 30 29 32 31 30  GLUCOSE 112* 110* 96 95 93  BUN 20 21 22 19 23   CREATININE 0.78 0.73 1.610.76 0.69 0.67  CALCIUM 8.2* 8.1* 8.3* 8.3* 8.4*  MG 2.1 2.1 2.1 2.3 2.3   Estimated Creatinine Clearance: 72.5 mL/min (by C-G formula based on SCr of 0.67 mg/dL).   LIVER Recent Labs  Lab 10/30/18 0505 10/31/18 0200 11/01/18 0500 11/02/18 0515 11/03/18 0115 11/03/18 0810  AST 23 26 26 29  32  --   ALT 15 14 13 14 13   --   ALKPHOS 84 86 94 111 123  --   BILITOT 0.5 0.2* 0.5 0.9 0.7  --   PROT 5.3* 5.1* 5.3* 5.8* 5.5*  --   ALBUMIN 2.8* 2.8* 3.1* 3.1* 3.0*  --   INR  --   --   --   --   --  1.6*     INFECTIOUS No results for input(s): LATICACIDVEN, PROCALCITON in the last 168 hours.   ENDOCRINE CBG (last 3)  No results for input(s): GLUCAP in the last 72 hours.       IMAGING x48h  - image(s) personally visualized  -   highlighted in bold No results found.   Resolved Hospital Problem list   Mild hemoptysis > resolved  Assessment & Plan:  Acute respiratory failure with hypoxemia due to COVID-19 pneumonia Hypoxemia has been slowly worsening over 7/1-7/4 - Currently showing no signs of ventilatory failure Acute diastolic heart failure leading to acute pulmonary edema > euvolemic on exam 7/4 an don 11/03/2018   11/03/2018 - Unchanged very severey hypoxemia but tolerating it like a "happy hypoxemic"  PLAN Continue HFNC via heated  method - olerate periods of hypoxemia, goal at rest is greater than 85% SaO2, with movement ideally above 75%   Continue Solu-Medrol  Maintain in ICU  INtubated if worse - TDecision for intubation should be based on a change in mental status or physical evidence of ventilatory failure such as nasal flaring, accessory muscle use, paradoxical breathing  Out of bed to chair as able  Prone positioning while in bed  History of aortic/mitral valve replacement, diastolic heart failure. On Eliquis for atrial fibrillation Telemetry Continue Eliquis   Best practice:  Diet: regular diet Pain/Anxiety/Delirium protocol (if indicated): n/a VAP protocol (if indicated): n/a DVT prophylaxis: heparin infusion GI prophylaxis: n/a Glucose control: SSI Mobility: bed rest Code Status: DNR Family Communication: Tri[facility] MD to updated Disposition: remain in ICU  ATTESTATION & SIGNATURE   The patient Arnetia Bronk is critically ill with multiple organ systems failure and requires high complexity decision making for assessment and support, frequent evaluation and titration of therapies, application of advanced monitoring technologies and extensive interpretation of multiple databases.   Critical Care Time devoted to patient care services described in this note is  30  Minutes. This time reflects time of care of this signee Dr Brand Males. This critical care time does not reflect procedure time, or teaching time or supervisory time of PA/NP/Med student/Med Resident etc but could involve care discussion time     Dr. Brand Males, M.D., [facility].C.P Pulmonary and Critical Care Medicine Staff Physician [facility] Pager: [phone], If no answer or between  15:00h - 7:00h: call [phone]  11/03/2018 2:26 PM

## 2018-11-03 NOTE — Progress Notes (Signed)
The patient has become more alert, calm and cooperative after two hour interval of BiPAP. Patient states she never wants to wear that mask again unless she "had" to. I explained to patient how the BiPAP orders were written and that she will more than likely need to wear it again. At this time she is agreeable, however, needs more education and encouraging.

## 2018-11-04 LAB — COMPREHENSIVE METABOLIC PANEL
ALT: 12 U/L (ref 0–44)
AST: 29 U/L (ref 15–41)
Albumin: 3 g/dL — ABNORMAL LOW (ref 3.5–5.0)
Alkaline Phosphatase: 118 U/L (ref 38–126)
Anion gap: 16 — ABNORMAL HIGH (ref 5–15)
BUN: 24 mg/dL — ABNORMAL HIGH (ref 8–23)
CO2: 27 mmol/L (ref 22–32)
Calcium: 8.2 mg/dL — ABNORMAL LOW (ref 8.9–10.3)
Chloride: 98 mmol/L (ref 98–111)
Creatinine, Ser: 0.55 mg/dL (ref 0.44–1.00)
GFR calc Af Amer: >60 mL/min (ref >60)
GFR calc non Af Amer: >60 mL/min (ref >60)
Glucose, Bld: 123 mg/dL — ABNORMAL HIGH (ref 70–99)
Potassium: 3.9 mmol/L (ref 3.5–5.1)
Sodium: 141 mmol/L (ref 135–145)
Total Bilirubin: 0.7 mg/dL (ref 0.3–1.2)
Total Protein: 5 g/dL — ABNORMAL LOW (ref 6.5–8.1)

## 2018-11-04 LAB — SAVE SMEAR(SSMR), FOR PROVIDER SLIDE REVIEW

## 2018-11-04 LAB — CBC
HCT: 30.4 % — ABNORMAL LOW (ref 36.0–46.0)
Hemoglobin: 9.2 g/dL — ABNORMAL LOW (ref 12.0–15.0)
MCH: 27.3 pg (ref 26.0–34.0)
MCHC: 30.3 g/dL (ref 30.0–36.0)
MCV: 90.2 fL (ref 80.0–100.0)
Platelets: 20 10*3/uL — CL (ref 150–400)
RBC: 3.37 MIL/uL — ABNORMAL LOW (ref 3.87–5.11)
RDW: 18.6 % — ABNORMAL HIGH (ref 11.5–15.5)
WBC: 10 10*3/uL (ref 4.0–10.5)
nRBC: 0.3 % — ABNORMAL HIGH (ref 0.0–0.2)

## 2018-11-04 LAB — LACTATE DEHYDROGENASE: LDH: 857 U/L — ABNORMAL HIGH (ref 98–192)

## 2018-11-04 LAB — FERRITIN: Ferritin: 133 ng/mL (ref 11–307)

## 2018-11-04 LAB — PREPARE RBC (CROSSMATCH)

## 2018-11-04 LAB — C-REACTIVE PROTEIN: CRP: 0.9 mg/dL (ref <1.0)

## 2018-11-04 LAB — GLUCOSE, CAPILLARY: Glucose-Capillary: 248 mg/dL — ABNORMAL HIGH (ref 70–99)

## 2018-11-04 MED ORDER — ORAL CARE MOUTH RINSE
15.0000 mL | Freq: Two times a day (BID) | OROMUCOSAL | Status: DC
  Administered 2018-11-04 (×2): 15 mL via OROMUCOSAL

## 2018-11-04 MED ORDER — FUROSEMIDE 10 MG/ML IJ SOLN
20.0000 mg | Freq: Once | INTRAMUSCULAR | Status: AC
  Administered 2018-11-04: 20 mg via INTRAVENOUS
  Filled 2018-11-04: qty 2

## 2018-11-04 MED ORDER — PHYTONADIONE 5 MG PO TABS
10.0000 mg | ORAL_TABLET | Freq: Once | ORAL | Status: DC
  Filled 2018-11-04: qty 2

## 2018-11-04 MED ORDER — CHLORHEXIDINE GLUCONATE 0.12 % MT SOLN
15.0000 mL | Freq: Two times a day (BID) | OROMUCOSAL | Status: DC
  Administered 2018-11-04 (×2): 15 mL via OROMUCOSAL
  Filled 2018-11-04 (×3): qty 15

## 2018-11-04 MED ORDER — SODIUM CHLORIDE 0.9% IV SOLUTION
Freq: Once | INTRAVENOUS | Status: AC
  Administered 2018-11-04: 20:00:00 via INTRAVENOUS

## 2018-11-04 NOTE — Progress Notes (Signed)
Next of kin updated on plan of care. Family expressed gratitude for the communication.

## 2018-11-04 NOTE — Progress Notes (Signed)
   RN reports hemoptysis  Recent Labs  Lab 11/01/18 0500 11/02/18 0515 11/03/18 0115 11/03/18 0810 11/04/18 0450  PLT 128* 77* 50* 42* 20*     Plan - Platelets x 1       SIGNATURE    Dr. Brand Males, M.D., F.C.C.P,  Pulmonary and Critical Care Medicine Staff Physician, [facility] Director - Interstitial Lung Disease  Program  Pulmonary [place] [facility] [place], [place], [address]  Pager: [phone], If no answer or between  15:00h - 7:00h: call [phone] Telephone: [phone]  7:03 PM 11/04/2018

## 2018-11-04 NOTE — Progress Notes (Signed)
Patient has no obvious signs of bleeding. No back or abdominal pain.

## 2018-11-04 NOTE — Progress Notes (Addendum)
PROGRESS NOTE                                                                                                                                                                                                             Patient Demographics:    Allison [name], is a 68 y.o. female, DOB - [birth year], VXB:[id number]  Outpatient Primary MD for the patient is [name], MD    LOS - 10  Chief Complaint  Patient presents with   Shortness of Breath       Brief Narrative: Patient is a 68 y.o. 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  She presented to the hospital on 6/26 with approximately 5-6-day history of cough along with minimal hemoptysis, subjective fever, malaise and worsening shortness of breath.  She was subsequently diagnosed with COVID-19-and found to have acute hypoxic respiratory failure secondary to COVID-19 pneumonia.  She was then admitted to the hospitalist service at [facility].  In spite of receiving treatment with steroids, Remdesivir, Actemra and convalescent plasma-her hypoxia continued to worsen-and was subsequently transferred to the ICU for further close monitoring.   Subjective:    Allison [name] today remains essentially unchanged-still requiring significant amount of oxygen.  Note-rounded with video interpreter via bedside iPad.     Assessment  & Plan :   Acute hypoxemic respiratory failure secondary to COVID-19 pneumonia: Continues to have very high oxygen requirements-briefly required BiPAP overnight.  Although significantly hypoxic-not in any distress and  appears comfortable.  Continue intravenous steroids.  Patient is s/p course of Remdesivir, Actemra x1 and convalescent plasma.  We will give 1 dose of Lasix today.    COVID-19 Labs:  Recent Labs    11/02/18 0515 11/03/18 0115 11/03/18 0810 11/04/18 0450  DDIMER >20.00* >20.00* >20.00*  --   FERRITIN 199 166  --  133  LDH  --   --   --  857*  CRP 0.8 0.8  --  0.9    COVID-19 Medications: 6/26>> convalescent plasma 6/25>> Solu-Medrol 6/25>> Remdesivir 6/25>> Actemra  Thrombocytopenia: Platelet counts continue  to decrease-no evidence of bleeding on exam or by history-suspicion for development of DIC based on DIC panel (elevated d-dimer, low fibrinogen).  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  Low clinical suspicion for ITP.  Since hemoglobin is normal-unlikely to have TTP.  Spoke at length with Dr. Truett Perna (Hem-Onc call)-reviewed chart with me over the phone-recommendations are to await HIT antibody-continue to monitor closely off anticoagulation for now.  If patient develops bleeding-we will transfuse platelets, if fibrinogen falls less than 100-we will transfuse cryoprecipitate.  Dr. Truett Perna will continue to follow patient remotely-and if platelet count decreases further-he will do a in person consult over the next few days.  Hemoptysis: Suspect secondary to extensive pneumonia/bronchial injury from coughing-resolved with supportive care.  Significantly elevated d-dimer: CTA chest negative for large PE-lower extremity Doppler negative for DVT-already on anticoagulation.  PAF: Remains in sinus rhythm-see above regarding thrombocytopenia-hold anticoagulation today.   Chronic combined systolic and diastolic heart failure: Volume status appears stable-even persistent hypoxemia-we will give IV Lasix x1 today.  Hypertension: Blood pressure controlled-resume antihypertensives when able  S/p bioprosthetic aortic valve replacement, s/p mitral valve  replacement, s/p tricuspid valve repair-PFO closure in 2017  Bronchial asthma: Appears stable-continue bronchodilators and montelukast  Hypothyroidism: Continue with Synthroid  GERD: Continue Pepcid  Recent Right femure fracture: s/p ORIF on 6/7 at [facility]-outpatient ortho note as of 6/22 reviewed-NWB for 4 more weeks-will order PT/OT consult   Deafness: Bedside video/iPad interpreter using sign language utilized  Vent Mode: BIPAP FiO2 (%):  [50 %-100 %] 50 % Set Rate:  [8 bmp-15 bmp] 15 bmp PEEP:  [10 cmH20] 10 cmH20  ABG:    Component Value Date/Time   PHART 7.514 (H) 10/29/2018 0306   PCO2ART 38.5 10/29/2018 0306   PO2ART 46.0 (L) 10/29/2018 0306   HCO3 31.1 (H) 10/29/2018 0306   TCO2 32 10/29/2018 0306   ACIDBASEDEF 6.0 (H) 04/14/2016 0531   O2SAT 86.0 10/29/2018 0306    Condition - Extremely Guarded  Family Communication  : Unable to leave voicemail for daughter Joni Reining, subsequently called daughter Amy-and left a voicemail as well   Addendum 5:20 PM Able to reach patient's daughter Nicole-explained worsening oxygenation over the past 2 days requiring BiPAP.  Also explained regarding dropping platelet count-potential diagnosis of DIC, HIT.  Clearly explained that patient is critically ill and at the age of worsening and even death.  DNR reconfirmed.  Joni Reining will reach out to other family members and inform them of these developments.  Code Status : DNR  Diet :  Diet Order            Diet regular Room service appropriate? Yes; Fluid consistency: Thin  Diet effective now               Disposition Plan  :  Remain inpatient-remain in the ICU as very high oxygen requirement.  Consults  : None  Procedures  :  None  GI prophylaxis: H2 Blocker  DVT Prophylaxis  : Eliquis-on hold  Lab Results  Component Value Date   PLT 20 (LL) 11/04/2018    Inpatient Medications  Scheduled Meds:  atorvastatin  20 mg Oral q1800   benzonatate  200 mg Oral TID    calcium-vitamin D  1 tablet Oral BID   chlorhexidine  15 mL Mouth Rinse BID   Chlorhexidine Gluconate Cloth  6 each Topical Daily   clonazepam  0.5 mg Oral BID   docusate sodium  200  mg Oral BID   famotidine  20 mg Oral BID   feeding supplement  1 Container Oral TID BM   ferrous sulfate  325 mg Oral Q breakfast   fluticasone  2 spray Each Nare Daily   gabapentin  600 mg Oral TID   levothyroxine  137 mcg Oral QAC breakfast   loratadine  10 mg Oral Daily   mouth rinse  15 mL Mouth Rinse q12n4p   methylPREDNISolone (SOLU-MEDROL) injection  40 mg Intravenous Q12H   montelukast  10 mg Oral QPM   polyethylene glycol  17 g Oral Daily   Continuous Infusions:  dexmedetomidine (PRECEDEX) IV infusion 1 mcg/kg/hr (11/04/18 1058)   PRN Meds:.acetaminophen, bisacodyl, chlorpheniramine-HYDROcodone, diphenhydrAMINE, guaiFENesin-dextromethorphan, HYDROcodone-acetaminophen, Ipratropium-Albuterol, lip balm, LORazepam, menthol-cetylpyridinium, [DISCONTINUED] ondansetron **OR** ondansetron (ZOFRAN) IV, simethicone, sodium chloride, tiZANidine  Antibiotics  :    Anti-infectives (From admission, onward)   Start     Dose/Rate Route Frequency Ordered Stop   10/26/18 1600  remdesivir 100 mg in sodium chloride 0.9 % 250 mL IVPB     100 mg 500 mL/hr over 30 Minutes Intravenous Every 24 hours 11/20/18 1440 10/29/18 1650   11-20-2018 1600  remdesivir 200 mg in sodium chloride 0.9 % 250 mL IVPB     200 mg 500 mL/hr over 30 Minutes Intravenous Once 2018-11-20 1440 11-20-2018 1757   November 20, 2018 0830  cefTRIAXone (ROCEPHIN) 2 g in sodium chloride 0.9 % 100 mL IVPB  Status:  Discontinued     2 g 200 mL/hr over 30 Minutes Intravenous Every 24 hours 20-Nov-2018 0807 November 20, 2018 1509   Nov 20, 2018 0830  azithromycin (ZITHROMAX) 500 mg in sodium chloride 0.9 % 250 mL IVPB  Status:  Discontinued     500 mg 250 mL/hr over 60 Minutes Intravenous Every 24 hours November 20, 2018 0807 November 20, 2018 1509       Time Spent in minutes  35     Jeoffrey Massed M.D on 11/04/2018 at 11:25 AM  To page go to [email or web address] - use universal password  [facility] -  Office  [phone]  See all Orders from today for further details   Admit date - 11/20/2018    10    Objective:   Vitals:   11/04/18 0734 11/04/18 0800 11/04/18 0900 11/04/18 1020  BP: (!) 101/54 (!) 95/50 93/65   Pulse: 92 93 (!) 103   Resp: (!) 36 (!) 29    Temp:   (!) 97.4 F (36.3 C)   TempSrc:   Axillary   SpO2: (!) 89% (!) 85% (!) 78% (!) 65%  Weight:      Height:        Wt Readings from Last 3 Encounters:  11/04/18 81.4 kg  03/19/18 74.8 kg  01/02/18 78.5 kg     Intake/Output Summary (Last 24 hours) at 11/04/2018 1125 Last data filed at 11/04/2018 1000 Gross per 24 hour  Intake 575.69 ml  Output 2175 ml  Net -1599.31 ml     Physical Exam General appearance:Awake, alert, not in any distress.  Eyes:no scleral icterus. HEENT: Atraumatic and Normocephalic Neck: supple, no JVD. Resp:Good air entry bilaterally, bibasilar rales CVS: S1 S2 regular, no murmurs.  GI: Bowel sounds present, Non tender and not distended with no gaurding, rigidity or rebound. Extremities: B/L Lower Ext shows no edema, both legs are warm to touch Neurology:  Non focal Musculoskeletal:No digital cyanosis Skin:No Rash, warm and dry Wounds:N/A   Data Review:    CBC Recent Labs  Lab 10/30/18 0505 10/31/18 0200 11/01/18  0500 11/02/18 0515 11/03/18 0115 11/03/18 0810 11/04/18 0450  WBC 8.5 10.6* 9.9 11.4* 10.4  --  10.0  HGB 8.8* 8.9* 9.1* 10.0* 9.4*  --  9.2*  HCT 30.2* 29.6* 30.9* 32.8* 31.3*  --  30.4*  PLT 259 194 128* 77* 50* 42* 20*  MCV 88.3 88.4 88.3 88.6 89.2  --  90.2  MCH 25.7* 26.6 26.0 27.0 26.8  --  27.3  MCHC 29.1* 30.1 29.4* 30.5 30.0  --  30.3  RDW 17.4* 17.5* 17.6* 17.9* 18.2*  --  18.6*  LYMPHSABS 0.8 0.9 0.8 0.6* 0.8  --   --   MONOABS 0.4 0.5 0.5 0.4 0.5  --   --   EOSABS 0.0 0.0 0.0 0.1 0.0  --   --   BASOSABS 0.0 0.0 0.0  0.0 0.0  --   --     Chemistries  Recent Labs  Lab 10/30/18 0505 10/31/18 0200 11/01/18 0500 11/02/18 0515 11/03/18 0115 11/04/18 0450  NA 139 139 139 136 136 141  K 4.3 3.5 3.1* 3.6 3.4* 3.9  CL 101 99 95* 94* 95* 98  CO2 30 29 32 31 30 27   GLUCOSE 112* 110* 96 95 93 123*  BUN 20 21 22 19 23  24*  CREATININE 0.78 0.73 0.76 0.69 0.67 0.55  CALCIUM 8.2* 8.1* 8.3* 8.3* 8.4* 8.2*  MG 2.1 2.1 2.1 2.3 2.3  --   AST 23 26 26 29  32 29  ALT 15 14 13 14 13 12   ALKPHOS 84 86 94 111 123 118  BILITOT 0.5 0.2* 0.5 0.9 0.7 0.7   ------------------------------------------------------------------------------------------------------------------ No results for input(s): CHOL, HDL, LDLCALC, TRIG, CHOLHDL, LDLDIRECT in the last 72 hours.  Lab Results  Component Value Date   HGBA1C 5.9 (H) 04/10/2016   ------------------------------------------------------------------------------------------------------------------ No results for input(s): TSH, T4TOTAL, T3FREE, THYROIDAB in the last 72 hours.  Invalid input(s): FREET3 ------------------------------------------------------------------------------------------------------------------ Recent Labs    11/03/18 0115 11/04/18 0450  FERRITIN 166 133    Coagulation profile Recent Labs  Lab 11/03/18 0810  INR 1.6*    Recent Labs    11/03/18 0115 11/03/18 0810  DDIMER >20.00* >20.00*    Cardiac Enzymes No results for input(s): CKMB, TROPONINI, MYOGLOBIN in the last 168 hours.  Invalid input(s): CK ------------------------------------------------------------------------------------------------------------------    Component Value Date/Time   BNP 42.0 11/02/2018 0515   BNP 310.6 (H) 01/20/2016 1435    Micro Results Recent Results (from the past 240 hour(s))  MRSA PCR Screening     Status: None   Collection Time: 10/28/18 11:00 AM   Specimen: Nasal Mucosa; Nasopharyngeal  Result Value Ref Range Status   MRSA by PCR NEGATIVE  NEGATIVE Final    Comment:        The GeneXpert MRSA Assay (FDA approved for NASAL specimens only), is one component of a comprehensive MRSA colonization surveillance program. It is not intended to diagnose MRSA infection nor to guide or monitor treatment for MRSA infections. Performed at [facility], [address] [place], [place] [id number]     Radiology Reports Ct Angio Chest Pe W Or Wo Contrast  Result Date: 11/01/2018 CLINICAL DATA:  COVID positive, hypoxic EXAM: CT ANGIOGRAPHY CHEST WITH CONTRAST TECHNIQUE: Multidetector CT imaging of the chest was performed using the standard protocol during bolus administration of intravenous contrast. Multiplanar CT image reconstructions and MIPs were obtained to evaluate the vascular anatomy. CONTRAST:  100mL OMNIPAQUE IOHEXOL 350 MG/ML SOLN COMPARISON:  Chest x-ray 10/28/2018; prior CT scan  of the chest 09/25/2017 FINDINGS: Cardiovascular: Limited evaluation of the pulmonary arteries beyond the lobar level secondary to extensive streak artifact from bilateral shoulder arthroplasty prostheses and respiratory motion. No evidence of large central filling defect to suggest acute pulmonary embolus. The main pulmonary artery is enlarged at 3.4 cm. Left atrial appendage ligation clip present. Surgical changes of prior aortic and mitral valve replacement. Normal caliber aorta. Mild cardiomegaly. No pericardial effusion. Mediastinum/Nodes: Limited evaluation of the mediastinum secondary to streak artifact from bilateral shoulder arthroplasties. Increased low right paratracheal lymph node now measuring 1.5 cm compared to 1.3 cm previously. Unremarkable esophagus. Lungs/Pleura: Severe diffuse bilateral ground-glass attenuation airspace opacities with areas of more confluent infiltrate in both lower lobes. Scattered areas of normal aeration confirms that there is no underlying pulmonary edema. Small right and trace left pleural effusions. No  pneumothorax. Upper Abdomen: No acute abnormality. Musculoskeletal: Healed median sternotomy. No acute fracture or aggressive appearing lytic or blastic osseous lesion. Review of the MIP images confirms the above findings. IMPRESSION: 1. Negative for acute pulmonary embolus to the proximal lobar level. Evaluation of the more distal pulmonary arterial tree is limited by a combination of extensive respiratory motion artifact and streak artifact related to bilateral shoulder joint arthroplasty prostheses. 2. Severe bilateral ground-glass and more confluent airspace opacities consistent with severe bilateral viral pneumonia versus ARDS. 3. Small right and trace left pleural effusions. 4. Mild cardiomegaly with evidence of prior aortic and mitral valve replacement. 5. Enlarged main pulmonary artery at 3.4 cm suggests underlying pulmonary arterial hypertension. 6. Slightly enlarged right paratracheal lymph node is likely reactive. Electronically Signed   By: Malachy MoanHeath  McCullough M.D.   On: 11/01/2018 13:07   Dg Chest Port 1 View  Result Date: 10/28/2018 CLINICAL DATA:  Acute respiratory failure with hypoxemia EXAM: PORTABLE CHEST 1 VIEW COMPARISON:  Two days ago FINDINGS: Marked diffuse worsening of bilateral airspace disease. Cardiomegaly and vascular pedicle widening. Three valves have been repaired previously. Right PICC with tip at the SVC. No pneumothorax. IMPRESSION: Extensive bilateral airspace disease with significant progression from 2 days ago. This could be worsening pneumonia or superimposed edema. Electronically Signed   By: Marnee SpringJonathon  Watts M.D.   On: 10/28/2018 05:07   Dg Chest Port 1 View  Result Date: 2019-02-01 CLINICAL DATA:  Cough for 3 days.  Positive COVID-19 EXAM: PORTABLE CHEST 1 VIEW COMPARISON:  12/20/2017 FINDINGS: Borderline heart size. Three valves have been replaced and there is left atrial clipping. Patchy bilateral lung opacity likely related to COVID-19 positivity. No effusion or  pneumothorax. Lung volumes are low. IMPRESSION: Bilateral pneumonia in the setting of COVID-19. Electronically Signed   By: Marnee SpringJonathon  Watts M.D.   On: 02020-10-03 06:35   Dg Chest Port 1v Same Day  Result Date: 10/26/2018 CLINICAL DATA:  Shortness of breath EXAM: PORTABLE CHEST 1 VIEW COMPARISON:  Yesterday FINDINGS: Improved lung volumes and mild decrease in opacity. There is still extensive patchy bilateral airspace disease in the setting of COVID-19. Densest opacity in the right upper lobe. Postoperative heart with replaced valves. No effusion or pneumothorax. IMPRESSION: Improved lung volumes and aeration, but still extensive pneumonia asymmetric to the right upper lobe. Electronically Signed   By: Marnee SpringJonathon  Watts M.D.   On: 10/26/2018 08:45   Vas [place] Lower Extremity Venous (dvt)  Result Date: 10/31/2018  Lower Venous Study Indications: Covid +.  Limitations: Patient with recent leg fracture and ORIF. Comparison Study: Negative study from 10/05/2017 is available Performing Technologist: Sherren Kernsandace Kanady RVS  Examination  Guidelines: A complete evaluation includes B-mode imaging, spectral Doppler, color Doppler, and power Doppler as needed of all accessible portions of each vessel. Bilateral testing is considered an integral part of a complete examination. Limited examinations for reoccurring indications may be performed as noted.  +---------+---------------+---------+-----------+----------+-------+  RIGHT     Compressibility Phasicity Spontaneity Properties Summary  +---------+---------------+---------+-----------+----------+-------+  CFV       Full            Yes       Yes                             +---------+---------------+---------+-----------+----------+-------+  SFJ       Full                                                      +---------+---------------+---------+-----------+----------+-------+  FV Prox   Full                                                       +---------+---------------+---------+-----------+----------+-------+  FV Mid    Full                                                      +---------+---------------+---------+-----------+----------+-------+  FV Distal Full                                                      +---------+---------------+---------+-----------+----------+-------+  PFV       Full                                                      +---------+---------------+---------+-----------+----------+-------+  POP                       Yes       Yes                             +---------+---------------+---------+-----------+----------+-------+  PTV       Full                                                      +---------+---------------+---------+-----------+----------+-------+  PERO      Full                                                      +---------+---------------+---------+-----------+----------+-------+   +---------+---------------+---------+-----------+----------+-------+  LEFT      Compressibility Phasicity Spontaneity Properties Summary  +---------+---------------+---------+-----------+----------+-------+  CFV       Full            Yes       Yes                             +---------+---------------+---------+-----------+----------+-------+  SFJ       Full                                                      +---------+---------------+---------+-----------+----------+-------+  FV Prox   Full                                                      +---------+---------------+---------+-----------+----------+-------+  FV Mid    Full                                                      +---------+---------------+---------+-----------+----------+-------+  FV Distal Full                                                      +---------+---------------+---------+-----------+----------+-------+  PFV       Full                                                      +---------+---------------+---------+-----------+----------+-------+  POP                        Yes       Yes                             +---------+---------------+---------+-----------+----------+-------+  PTV       Full                                                      +---------+---------------+---------+-----------+----------+-------+  PERO      Full                                                      +---------+---------------+---------+-----------+----------+-------+     Summary: Right: There is no evidence of deep vein thrombosis in the lower extremity. However, portions of this examination were limited- see technologist comments above. Left: There is no evidence of deep vein thrombosis  in the lower extremity. However, portions of this examination were limited- see technologist comments above.  *See table(s) above for measurements and observations. Electronically signed by Sherald Hess MD on 10/31/2018 at 2:21:27 PM.    Final    [place] Ekg Site Rite  Result Date: 10/27/2018 If [facility] image not attached, placement could not be confirmed due to current cardiac rhythm.

## 2018-11-04 NOTE — Progress Notes (Addendum)
Occupational Therapy Treatment Patient Details Name: Allison [name] MRN: [id number] DOB: [birth year] Today's Date: 11/04/2018    History of present illness 68 year old female who has multiple cardiovascular comorbid illnesses including valvular heart disease for which she takes Eliquis, atrial fibrillation who contracted COVID-19 pneumonia and was admitted to our facility for the same.  She had a fall early in June and suffered a rt distal peri-prosthetic femur fx and underwent [facility].  She was transferred to [facility].  There she tested positive for COVID-19. Per chart review, 10/21/18 ortho MD office note states NWB for another 4 weeks.    OT comments  Upon arrival, pt awake and supine in bed with BiPAP in place; RN doffing BiPAP and placing pt on HFNC with 40L O2 FiO2 at 60%. Optimizing position in bed for exercises and self feeding. Pt motivated to participate in therapy and performing exercises for BUEs with level 1 therband (10 reps each). SpO2 ranging between 86%-79% during exercises. Pt taking rest break between each set. Will continue to follow acutely as admitted and continue to recommend dc to SNF.    Follow Up Recommendations  SNF;Supervision/Assistance - 24 hour    Equipment Recommendations  None recommended by OT(AE)    Recommendations for Other Services      Precautions / Restrictions Precautions Precautions: Fall Precaution Comments: bledsoe brace Restrictions Weight Bearing Restrictions: Yes RLE Weight Bearing: Non weight bearing       Mobility Bed Mobility Overal bed mobility: Needs Assistance             General bed mobility comments: Max A +2 to elevate in bed and optimize position for exercises  Transfers                 General transfer comment: Deferred due to vitals    Balance                                           ADL either performed or assessed with clinical judgement   ADL  Overall ADL's : Needs assistance/impaired Eating/Feeding: Set up;Supervision/ safety;Bed level Eating/Feeding Details (indicate cue type and reason): Pt performing self feeding in bed                                    General ADL Comments: Focused session on bed level exercises as pt with low SpO2 on 40L O2 and FiO2 60%     Vision       Perception     Praxis      Cognition Arousal/Alertness: Awake/alert Behavior During Therapy: WFL for tasks assessed/performed(great sense of humor) Overall Cognitive Status: No family/caregiver present to determine baseline cognitive functioning                                 General Comments: Most likely close to baseline        Exercises Exercises: General Upper Extremity General Exercises - Upper Extremity Shoulder Flexion: AROM;Strengthening;Both;10 reps;Theraband;Supine Theraband Level (Shoulder Flexion): Level 1 (Yellow) Shoulder Extension: AROM;Strengthening;Both;10 reps;Theraband;Supine Theraband Level (Shoulder Extension): Level 1 (Yellow) Shoulder ABduction: Strengthening;Both;10 reps;Theraband;Supine Theraband Level (Shoulder Abduction): Level 1 (Yellow) Shoulder Horizontal ABduction: Strengthening;Both;15 reps;Theraband;Supine Theraband Level (Shoulder Horizontal Abduction): Level 1 (Yellow) Elbow Flexion:  Strengthening;Both;10 reps;Theraband;Supine Theraband Level (Elbow Flexion): Level 1 (Yellow) Elbow Extension: Strengthening;Both;10 reps;Theraband;Supine Theraband Level (Elbow Extension): Level 1 (Yellow)   Shoulder Instructions       General Comments SpO2 ranging between 86-79% on 40L with FiO2 60%.    Pertinent Vitals/ Pain       Pain Assessment: Faces Faces Pain Scale: Hurts a little bit Pain Location: generalized Pain Descriptors / Indicators: Sore Pain Intervention(s): Monitored during session;Limited activity within patient's tolerance;Repositioned  Home Living                                           Prior Functioning/Environment              Frequency  Min 3X/week        Progress Toward Goals  OT Goals(current goals can now be found in the care plan section)  Progress towards OT goals: Progressing toward goals  Acute Rehab OT Goals Patient Stated Goal: be able to walk again OT Goal Formulation: With patient Time For Goal Achievement: 11/13/18 Potential to Achieve Goals: Good ADL Goals Pt Will Perform Lower Body Bathing: with set-up;with supervision;with adaptive equipment;sitting/lateral leans Pt Will Perform Lower Body Dressing: with supervision;with set-up;with adaptive equipment;sitting/lateral leans Pt Will Transfer to Toilet: squat pivot transfer;bedside commode;with min guard assist Pt Will Perform Toileting - Clothing Manipulation and hygiene: with min guard assist;sitting/lateral leans;with adaptive equipment Pt/caregiver will Perform Home Exercise Program: Increased strength;Both right and left upper extremity;With written HEP provided;With theraband;Independently Additional ADL Goal #1: Pt will complete ADL task with SpO2 remaining above 90  Plan Discharge plan remains appropriate    Co-evaluation                 AM-PAC OT "6 Clicks" Daily Activity     Outcome Measure   Help from another person eating meals?: None Help from another person taking care of personal grooming?: A Little Help from another person toileting, which includes using toliet, bedpan, or urinal?: A Little Help from another person bathing (including washing, rinsing, drying)?: A Lot Help from another person to put on and taking off regular upper body clothing?: A Little Help from another person to put on and taking off regular lower body clothing?: A Lot 6 Click Score: 17    End of Session Equipment Utilized During Treatment: Gait belt;Oxygen(35L)  OT Visit Diagnosis: Unsteadiness on feet (R26.81);Other abnormalities of gait and  mobility (R26.89);Muscle weakness (generalized) (M62.81);Pain Pain - Right/Left: Left Pain - part of body: Hip   Activity Tolerance Patient tolerated treatment well   Patient Left in chair;with call bell/phone within reach;with nursing/sitter in room   Nurse Communication Mobility status;Precautions;Weight bearing status        Time: 4709-6283 OT Time Calculation (min): 20 min  Charges: OT General Charges $OT Visit: 1 Visit OT Treatments $Therapeutic Exercise: 8-22 mins  [place], OTR/L Acute Rehab Pager: [phone] Office: [place] 11/04/2018, 4:54 PM

## 2018-11-04 NOTE — Progress Notes (Signed)
Pt on facetime with her daughter at change of shift. Daughter updated at that time and had no further questions.

## 2018-11-04 NOTE — Progress Notes (Signed)
RN has attempted twice to contact next of kin for daily update. Family has been unavailable. Will call again later this afternoon.

## 2018-11-04 NOTE — Progress Notes (Signed)
NAME:  Allison [name], MRN:  [id number], DOB:  02/23/1951, LOS: 10 ADMISSION DATE:  06/26/18, CONSULTATION DATE:  10/27/2018 REFERRING MD:  Jerral RalphGhimire, CHIEF COMPLAINT:  Dyspnea   Brief History   [age] year old female with hypertension and combined systolic/diastolic heart failure admitted to [facility] on October 25, 2018 from a nursing home in the setting of acute respiratory failure with hypoxemia from COVID-19 pneumonia.  History of present illness   This is a 68 year old female who has multiple cardiovascular comorbid illnesses including valvular heart disease for which she takes Eliquis, atrial fibrillation who contracted COVID-19 pneumonia and was admitted to our facility for the same.  She had a fall early in June and suffered a leg fracture and underwent open reduction and internal fixation of her right distal femur at [facility].  She was transferred to [facility].  There she tested positive for COVID-19.  She presented to [facility]  on June 26 with hemoptysis, subjective fever, malaise, some abdominal pain.  After admission she was treated with steroids, remdesivir, Actemra, and convalescent plasma.  She developed worsening hypoxemia with coughing spells and was transferred to the intensive care unit early in the morning on October 27, 2018 for closer observation.  This morning she says she feels a little short of breath but it is not too bad, perhaps a little better than yesterday.  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 Remains hypoxemic, denies dyspnea. Says she is profoundly "bored". 7/5  November 03, 2018: She is hard of hearing and history taken through sign language interpreter.  Nurses at bedside physician of tried hospitalist report no change.  She is tachypneic but not paradoxical.  She is on high flow nasal cannula.  This is at 100% and 45 L.  Consults:  PCCM  Procedures:    Significant Diagnostic Tests:  2018 transthoracic echocardiogram showed bioprosthetic aortic and mitral valves, left atrium dilation, LVEF 50 to 55% 7/3 CT angiogram chest> no pulmonary embolism, diffuse airspace disease bilaterally, trace pleural effusions  Micro Data:  6/24 SARS-COV2> positive 6/26 blood culture > NGTD  Antimicrobials:  6/26 Ceftriaxone > x1 6/26 Azithro > x1 6/26 Remdesivir >  6/26 Actemra x1 627 convalescent plasma  Interim history/subjective:    7/6 - improved energy aftet brief x 2h x 2times bipap yesterday. STill on 100% 45 L and with crackles Eating  Objective   Blood pressure (!) 96/52, pulse 73, temperature (!) 97.4 F (36.3 C), temperature source Axillary, resp. rate (!) 27, height 5\' 7"  (1.702 m), weight 81.4 kg, SpO2 94 %.    Vent Mode: BIPAP FiO2 (%):  [50 %-100 %] 60 % Set Rate:  [8 bmp-15 bmp] 15 bmp PEEP:  [10 cmH20] 10 cmH20   Intake/Output Summary (Last 24 hours) at 11/04/2018 1430 Last data filed at 11/04/2018 1000 Gross per 24 hour  Intake 325.69 ml  Output 1975 ml  Net -1649.31 ml   Filed Weights   11/02/18 0500 11/03/18 0344 11/04/18 0500  Weight: 82.8 kg 80.7 kg 81.4 kg     Examination: General Appearance:  Looks criticall ill Head:  Normocephalic, without obvious abnormality, atraumatic Eyes:  PERRL - yes, conjunctiva/corneas - clear     Ears:  Normal external ear canals, both ears Nose:  G tube - no  but has [place] o2 Throat:  ETT TUBE - no , OG tube - n0o Neck:  Supple,  No enlargement/tenderness/nodules Lungs: mild tachypneic, not paradoxical, crackles Heart:  S1 and S2 normal, no murmur, CVP - no.  Pressors - no Abdomen:  Soft, no masses, no organomegaly Genitalia / Rectal:  Not done Extremities:  Extremities- intac Skin:  ntact in exposed areas . Sacral area - not examined Neurologic:  Sedation - none -> RASS - 1 . Moves all 4s - yes. CAM-ICU - neg . Orientation - x3+. [place] +      LABS    PULMONARY Recent Labs  Lab 10/29/18 0306  PHART 7.514*  PCO2ART 38.5  PO2ART 46.0*  HCO3 31.1*  TCO2 32  O2SAT 86.0    CBC Recent Labs  Lab 11/02/18 0515 11/03/18 0115 11/03/18 0810 11/04/18 0450  HGB 10.0* 9.4*  --  9.2*  HCT 32.8* 31.3*  --  30.4*  WBC 11.4* 10.4  --  10.0  PLT 77* 50* 42* 20*    COAGULATION Recent Labs  Lab 11/03/18 0810  INR 1.6*    CARDIAC  No results for input(s): TROPONINI in the last 168 hours. No results for input(s): PROBNP in the last 168 hours.   CHEMISTRY Recent Labs  Lab 10/30/18 0505 10/31/18 0200 11/01/18 0500 11/02/18 0515 11/03/18 0115 11/04/18 0450  NA 139 139 139 136 136 141  K 4.3 3.5 3.1* 3.6 3.4* 3.9  CL 101 99 95* 94* 95* 98  CO2 30 29 32 31 30 27   GLUCOSE 112* 110* 96 95 93 123*  BUN 20 21 22 19 23  24*  CREATININE 0.78 0.73 0.76 0.69 0.67 0.55  CALCIUM 8.2* 8.1* 8.3* 8.3* 8.4* 8.2*  MG 2.1 2.1 2.1 2.3 2.3  --    Estimated Creatinine Clearance: 72.8 mL/min (by C-G formula based on SCr of 0.55 mg/dL).   LIVER Recent Labs  Lab 10/31/18 0200 11/01/18 0500 11/02/18 0515 11/03/18 0115 11/03/18 0810 11/04/18 0450  AST 26 26 29  32  --  29  ALT 14 13 14 13   --  12  ALKPHOS 86 94 111 123  --  118  BILITOT 0.2* 0.5 0.9 0.7  --  0.7  PROT 5.1* 5.3* 5.8* 5.5*  --  5.0*  ALBUMIN 2.8* 3.1* 3.1* 3.0*  --  3.0*  INR  --   --   --   --  1.6*  --      INFECTIOUS No results for input(s): LATICACIDVEN,  PROCALCITON in the last 168 hours.   ENDOCRINE CBG (last 3)  Recent Labs    11/04/18 0745  GLUCAP 248*         IMAGING x48h  - image(s) personally visualized  -   highlighted in bold No results found.   Resolved Hospital Problem list   Mild hemoptysis > resolved  Assessment & Plan:  Acute respiratory failure with hypoxemia due to COVID-19 pneumonia Hypoxemia has been slowly worsening over 7/1-7/4 - Currently showing no signs of ventilatory failure Acute diastolic heart failure leading to acute pulmonary edema > euvolemic on exam 7/4 an don 11/03/2018   11/04/2018 - needing hfnc but seems more energetic after bip[a  PLAN Continue HFNC via heated method - olerate periods of hypoxemia, goal at rest is greater than 85% SaO2, with movement ideally above 75%   Continue Solu-Medrol  Maintain in ICU  BiPAP QHS - seems to be helping  Out of bed to chair as able  Prone positioning while in bed - refuses  DNI  History of aortic/mitral valve replacement, diastolic heart failure. On Eliquis for atrial fibrillation Telemetry Continue Eliquis   Best practice:  Diet: regular diet Pain/Anxiety/Delirium protocol (if indicated): n/a VAP protocol (if indicated): n/a DVT prophylaxis: heparin infusion GI prophylaxis: n/a Glucose control: SSI Mobility: bed rest Code Status: DNR Family Communication: [facility] MD to update Disposition: remain in IUICU   CCMw ill sign off     ATTESTATION & SIGNATURE    Dr. Kalman Shan, M.D., [facility].C.P Pulmonary and Critical Care Medicine Staff Physician [facility] Pager: [phone], If no answer or between  15:00h - 7:00h: call [phone]  11/04/2018 2:30 PM

## 2018-11-05 ENCOUNTER — Inpatient Hospital Stay (HOSPITAL_COMMUNITY): Payer: Medicare Other

## 2018-11-05 DIAGNOSIS — M7989 Other specified soft tissue disorders: Secondary | ICD-10-CM

## 2018-11-05 LAB — CBC
HCT: 34.3 % — ABNORMAL LOW (ref 36.0–46.0)
Hemoglobin: 10.7 g/dL — ABNORMAL LOW (ref 12.0–15.0)
MCH: 28.2 pg (ref 26.0–34.0)
MCHC: 31.2 g/dL (ref 30.0–36.0)
MCV: 90.5 fL (ref 80.0–100.0)
Platelets: 31 10*3/uL — ABNORMAL LOW (ref 150–400)
RBC: 3.79 MIL/uL — ABNORMAL LOW (ref 3.87–5.11)
RDW: 17.9 % — ABNORMAL HIGH (ref 11.5–15.5)
WBC: 14.5 10*3/uL — ABNORMAL HIGH (ref 4.0–10.5)
nRBC: 0.2 % (ref 0.0–0.2)

## 2018-11-05 LAB — COMPREHENSIVE METABOLIC PANEL
ALT: 12 U/L (ref 0–44)
AST: 27 U/L (ref 15–41)
Albumin: 2.8 g/dL — ABNORMAL LOW (ref 3.5–5.0)
Alkaline Phosphatase: 151 U/L — ABNORMAL HIGH (ref 38–126)
Anion gap: 10 (ref 5–15)
BUN: 25 mg/dL — ABNORMAL HIGH (ref 8–23)
CO2: 29 mmol/L (ref 22–32)
Calcium: 8.1 mg/dL — ABNORMAL LOW (ref 8.9–10.3)
Chloride: 98 mmol/L (ref 98–111)
Creatinine, Ser: 0.56 mg/dL (ref 0.44–1.00)
GFR calc Af Amer: >60 mL/min (ref >60)
GFR calc non Af Amer: >60 mL/min (ref >60)
Glucose, Bld: 129 mg/dL — ABNORMAL HIGH (ref 70–99)
Potassium: 3.9 mmol/L (ref 3.5–5.1)
Sodium: 137 mmol/L (ref 135–145)
Total Bilirubin: 1.2 mg/dL (ref 0.3–1.2)
Total Protein: 5.3 g/dL — ABNORMAL LOW (ref 6.5–8.1)

## 2018-11-05 LAB — TYPE AND SCREEN
ABO/RH(D): O POS
Antibody Screen: NEGATIVE
Unit division: 0

## 2018-11-05 LAB — BPAM RBC
Blood Product Expiration Date: 202008032359
ISSUE DATE / TIME: 202007062251
Unit Type and Rh: 5100

## 2018-11-05 LAB — HEPARIN INDUCED PLATELET AB (HIT ANTIBODY): Heparin Induced Plt Ab: 0.068 OD (ref 0.000–0.400)

## 2018-11-05 LAB — FERRITIN: Ferritin: 148 ng/mL (ref 11–307)

## 2018-11-05 LAB — C-REACTIVE PROTEIN: CRP: 1.4 mg/dL — ABNORMAL HIGH (ref <1.0)

## 2018-11-05 LAB — LACTATE DEHYDROGENASE: LDH: 787 U/L — ABNORMAL HIGH (ref 98–192)

## 2018-11-05 MED ORDER — MORPHINE 100MG IN NS 100ML (1MG/ML) PREMIX INFUSION
1.0000 mg/h | INTRAVENOUS | Status: DC
  Filled 2018-11-05: qty 100

## 2018-11-05 MED ORDER — ONDANSETRON HCL 4 MG/2ML IJ SOLN: 4.0000 mg | Freq: Four times a day (QID) | INTRAMUSCULAR | Status: DC | PRN

## 2018-11-05 MED ORDER — MORPHINE SULFATE (PF) 2 MG/ML IV SOLN: 1.0000 mg | INTRAVENOUS | Status: DC | PRN

## 2018-11-05 MED ORDER — LORAZEPAM 0.5 MG PO TABS: 1.0000 mg | ORAL_TABLET | ORAL | Status: DC | PRN

## 2018-11-05 MED ORDER — SODIUM CHLORIDE 0.9% FLUSH: 3.0000 mL | INTRAVENOUS | Status: DC | PRN

## 2018-11-05 MED ORDER — HALOPERIDOL LACTATE 2 MG/ML PO CONC: 0.5000 mg | ORAL | Status: DC | PRN

## 2018-11-05 MED ORDER — SODIUM CHLORIDE 0.9 % IV SOLN: 250.0000 mL | INTRAVENOUS | Status: DC | PRN

## 2018-11-05 MED ORDER — BIOTENE DRY MOUTH MT LIQD: 15.0000 mL | OROMUCOSAL | Status: DC | PRN

## 2018-11-05 MED ORDER — MORPHINE SULFATE (PF) 2 MG/ML IV SOLN
1.0000 mg | Freq: Once | INTRAVENOUS | Status: AC
  Administered 2018-11-05: 06:00:00 1 mg via INTRAVENOUS
  Filled 2018-11-05: qty 1

## 2018-11-05 MED ORDER — MORPHINE BOLUS VIA INFUSION
2.0000 mg | INTRAVENOUS | Status: DC | PRN
  Filled 2018-11-05: qty 2

## 2018-11-05 MED ORDER — HALOPERIDOL LACTATE 5 MG/ML IJ SOLN: 0.5000 mg | INTRAMUSCULAR | Status: DC | PRN

## 2018-11-05 MED ORDER — BOOST / RESOURCE BREEZE PO LIQD CUSTOM
1.0000 | Freq: Three times a day (TID) | ORAL | Status: DC
  Administered 2018-11-05: 11:00:00 1 via ORAL
  Filled 2018-11-05 (×2): qty 1

## 2018-11-05 MED ORDER — LORAZEPAM 2 MG/ML IJ SOLN: 1.0000 mg | INTRAMUSCULAR | Status: DC | PRN

## 2018-11-05 MED ORDER — LORAZEPAM 2 MG/ML PO CONC: 1.0000 mg | ORAL | Status: DC | PRN

## 2018-11-05 MED ORDER — DIPHENHYDRAMINE HCL 50 MG/ML IJ SOLN: 12.5000 mg | INTRAMUSCULAR | Status: DC | PRN

## 2018-11-05 MED ORDER — GLYCOPYRROLATE 0.2 MG/ML IJ SOLN: 0.2000 mg | INTRAMUSCULAR | Status: DC | PRN

## 2018-11-05 MED ORDER — ONDANSETRON 4 MG PO TBDP: 4.0000 mg | ORAL_TABLET | Freq: Four times a day (QID) | ORAL | Status: DC | PRN

## 2018-11-05 MED ORDER — POLYVINYL ALCOHOL 1.4 % OP SOLN: 1.0000 [drp] | Freq: Four times a day (QID) | OPHTHALMIC | Status: DC | PRN

## 2018-11-05 MED ORDER — GLYCOPYRROLATE 1 MG PO TABS
1.0000 mg | ORAL_TABLET | ORAL | Status: DC | PRN
  Filled 2018-11-05: qty 1

## 2018-11-05 MED ORDER — ACETAMINOPHEN 650 MG RE SUPP: 650.0000 mg | Freq: Four times a day (QID) | RECTAL | Status: DC | PRN

## 2018-11-05 MED ORDER — ACETAMINOPHEN 325 MG PO TABS: 650.0000 mg | ORAL_TABLET | Freq: Four times a day (QID) | ORAL | Status: DC | PRN

## 2018-11-05 MED ORDER — HALOPERIDOL 0.5 MG PO TABS: 0.5000 mg | ORAL_TABLET | ORAL | Status: DC | PRN

## 2018-11-05 MED ORDER — LORAZEPAM 2 MG/ML IJ SOLN
1.0000 mg | INTRAMUSCULAR | Status: DC | PRN
  Administered 2018-11-05: 11:00:00 1 mg via INTRAVENOUS
  Filled 2018-11-05: qty 1

## 2018-11-05 MED ORDER — SODIUM CHLORIDE 0.9% FLUSH: 3.0000 mL | Freq: Two times a day (BID) | INTRAVENOUS | Status: DC

## 2018-11-06 LAB — PREPARE PLATELET PHERESIS: Unit division: 0

## 2018-11-06 LAB — BPAM PLATELET PHERESIS
Blood Product Expiration Date: 202007082359
ISSUE DATE / TIME: 202007062244
Unit Type and Rh: 5100

## 2018-11-30 NOTE — Progress Notes (Signed)
Worsening-feels uncomfortable-much more fatigued and tired compared to yesterday-unable to come off the BiPAP-even on maximal BiPAP support-O2 saturation in the 70s to 80s range.  Chest x-ray done this morning shows significant bilateral infiltrates.  Unfortunately she has received maximal medical treatment at this point and really no further role to escalate care.  I had a long conversation with this patient-with iPad translator at bedside-I explained to her that she was deteriorating rapidly in spite of maximal medical care-and at this point there really is no further role in escalation of care.  I explained that with this trajectory-that she will likely pass in a matter of a few hours to a few days.  She is very uncomfortable with the BiPAP mask-and has required PRN dosing of Ativan.  She is also requesting water or some other beverage to drink.  She is also in visible distress with mild tachypnea-and looks much more weak and fatigued compared to just yesterday.  I explained the concept of comfort care-which she seems to understand well.  She asked me to talk to her daughter/family and explain the above.  I subsequently asked the nurse to see if we could get her off BiPAP-give her Ativan-and give her water for comfort.  I subsequently reached out to [place] (daughter)-explained rapidly deteriorating trajectory-in spite of maximal supportive care.  Explained that she is now uncomfortable-and seems to be in more distress compared to yesterday.  Also explained that her O2 saturations are only in the 70-80% range in spite of being on maximal support with BiPAP.  After extensive discussion-her daughter understood this grim situation-and the rationale to transition towards full comfort measures.  Family will decide if they want to come and visit the patient-this facility does have exceptions to visitation restrictions in terminal situations like this.  We will stop most of her medications and transition her to full  comfort measures.  Full note to follow shortly

## 2018-11-30 NOTE — Progress Notes (Signed)
Pt taken off bipap and placed on 40L Heated HFNC at 100% fio2 as pt is transitioning to comfort care.

## 2018-11-30 NOTE — Progress Notes (Signed)
   cotninued worsening DNR/DNI  Plan  - [facility] MD working on terminal care  - ccm will sign off     SIGNATURE    Dr. Brand Males, M.D., F.C.C.P,  Pulmonary and Critical Care Medicine Staff Physician, [place] Director - Interstitial Lung Disease  Program  Pulmonary [place] [facility] [place], [place], [address]  Pager: [phone], If no answer or between  15:00h - 7:00h: call [phone] Telephone: [phone]  12:32 PM Dec 05, 2018

## 2018-11-30 NOTE — Progress Notes (Signed)
Pronounced death with Heather at 1157. Notifed daughter, [name], of her mother's passing.  Hiram Gash RN

## 2018-11-30 NOTE — Death Summary Note (Signed)
DEATH SUMMARY   Patient Details  Name: Allison [name] MRN: [id number] DOB: 07/15/1950  Admission/Discharge Information   Admit Date:  10/29/2018  Date of Death: Date of Death: Feb 20, 2019  Time of Death: Time of Death: 1156  Length of Stay: 11  Referring Physician: Georgann [name], [name], MD   Reason(s) for Hospitalization  Acute hypoxic respiratory failure due to ARDS from COVID-19 pneumonia  Diagnoses  Preliminary cause of death:  Secondary Diagnoses (including complications and co-morbidities):  Principal Problem:   Pneumonia due to COVID-19 virus Active Problems:   Hypothyroidism   Anticoagulated   Deafness   Chronic combined systolic and diastolic CHF (congestive heart failure) (HCC)   Iron deficiency anemia   Sepsis (HCC)   Acute respiratory failure with hypoxemia (HCC)   AF (paroxysmal atrial fibrillation) (HCC)   Hyponatremia   Brief Hospital Course (including significant findings, care, treatment, and services provided and events leading to death)   Brief Narrative: Patient is a 68 y.o. 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  She presented to the hospital on 6/26 with approximately 5-6-day history of cough along with minimal hemoptysis, subjective fever, malaise and worsening shortness of breath.  She was subsequently diagnosed with COVID-19-and found to have acute hypoxic respiratory failure secondary to COVID-19 pneumonia.  She was then [facility].  In spite of receiving treatment with steroids, Remdesivir, Actemra and convalescent plasma-her hypoxia continued to worsen-with ARDS  pathophysiology and was subsequently transferred to the ICU for further close monitoring.  In spite of maximal supportive care-patient continued to deteriorate, after discussion with family-she was transitioned to full comfort measures-and subsequently expired at 1156 hrs. on 7/7.  Hospital course by problem list: Acute hypoxemic respiratory failure secondary to ARDS due to COVID-19 pneumonia: Unfortunately continued to deteriorate in spite of treatment with steroids, Remdesivir, convalescent plasma and Actemra x1.  She was transferred to the ICU due to high FiO2 requirements-over the past few days her FiO2 requirements continued to worsen-and was started on BiPAP.  Repeat chest x-ray on 7/7 continue to show extensive/worsening bilateral infiltrates.  Due to continued clinical worsening-and persistent hypoxemia in spite of being on maximal BiPAP support-after extensive discussion with the patient and then with patient's daughter Allison [name](Allison [name]) over the phone-patient was transitioned to full comfort measures.   Thrombocytopenia: Hospital course was complicated by development of thrombocytopenia-high suspicion for DIC based on laboratory values.  HIT was also on the differential as patient was on IV heparin infusion for 2 days on 6/8 and 6/9.  HIT antibody was ordered and is still pending.  Case was discussed with hematology-Dr. Hughes BetterSherrill-we will advise supportive care.  On 7/6 evening she started developing hemoptysis and was transfused 1 unit of platelets.  Due to severe thrombocytopenia-Eliquis was held.  Significantly elevated d-dimer: CTA chest negative for large PE-lower extremity Doppler negative for DVT.  Suspect this was probably secondary to DIC.  PAF: Rate controlled-in sinus rhythm-initially on anticoagulation-but due to development of severe thrombocytopenia-anticoagulation was held.  Chronic combined systolic and diastolic heart failure: Was compensated-did get as needed Lasix to keep patient in  negative balance.  Hypertension: Was stable.  S/p bioprosthetic aortic valve replacement, s/p mitral valve replacement, s/p  tricuspid valve repair-PFO closure in 2017  Bronchial asthma: Stable.  Hypothyroidism:  Was on Synthroid  GERD: Was maintained on Pepcid  Recent Right femure fracture: s/p ORIF on 6/7 at [facility]  Deafness:   Pertinent Labs and Studies  Significant Diagnostic Studies Ct Angio Chest Pe W Or Wo Contrast  Result Date: 11/01/2018 CLINICAL DATA:  COVID positive, hypoxic EXAM: CT ANGIOGRAPHY CHEST WITH CONTRAST TECHNIQUE: Multidetector CT imaging of the chest was performed using the standard protocol during bolus administration of intravenous contrast. Multiplanar CT image reconstructions and MIPs were obtained to evaluate the vascular anatomy. CONTRAST:  OMNIPAQUE IOHEXOL 350 MG/ML SOLN COMPARISON:  Chest x-ray 10/28/2018; prior CT scan of the chest 09/25/2017 FINDINGS: Cardiovascular: Limited evaluation of the pulmonary arteries beyond the lobar level secondary to extensive streak artifact from bilateral shoulder arthroplasty prostheses and respiratory motion. No evidence of large central filling defect to suggest acute pulmonary embolus. The main pulmonary artery is enlarged at 3.4 cm. Left atrial appendage ligation clip present. Surgical changes of prior aortic and mitral valve replacement. Normal caliber aorta. Mild cardiomegaly. No pericardial effusion. Mediastinum/Nodes: Limited evaluation of the mediastinum secondary to streak artifact from bilateral shoulder arthroplasties. Increased low right paratracheal lymph node now measuring 1.5 cm compared to 1.3 cm previously. Unremarkable esophagus. Lungs/Pleura: Severe diffuse bilateral ground-glass attenuation airspace opacities with areas of more confluent infiltrate in both lower lobes. Scattered areas of normal aeration confirms that there is no underlying pulmonary edema. Small right and trace left pleural effusions.  No pneumothorax. Upper Abdomen: No acute abnormality. Musculoskeletal: Healed median sternotomy. No acute fracture or aggressive appearing lytic or blastic osseous lesion. Review of the MIP images confirms the above findings. IMPRESSION: 1. Negative for acute pulmonary embolus to the proximal lobar level. Evaluation of the more distal pulmonary arterial tree is limited by a combination of extensive respiratory motion artifact and streak artifact related to bilateral shoulder joint arthroplasty prostheses. 2. Severe bilateral ground-glass and more confluent airspace opacities consistent with severe bilateral viral pneumonia versus ARDS. 3. Small right and trace left pleural effusions. 4. Mild cardiomegaly with evidence of prior aortic and mitral valve replacement. 5. Enlarged main pulmonary artery at 3.4 cm suggests underlying pulmonary arterial hypertension. 6. Slightly enlarged right paratracheal lymph node is likely reactive. Electronically Signed   By: Malachy Moan M.D.   On: 11/01/2018 13:07   Dg Chest Port 1 View  Result Date: 10/28/2018 CLINICAL DATA:  Acute respiratory failure with hypoxemia EXAM: PORTABLE CHEST 1 VIEW COMPARISON:  Two days ago FINDINGS: Marked diffuse worsening of bilateral airspace disease. Cardiomegaly and vascular pedicle widening. Three valves have been repaired previously. Right PICC with tip at the SVC. No pneumothorax. IMPRESSION: Extensive bilateral airspace disease with significant progression from 2 days ago. This could be worsening pneumonia or superimposed edema. Electronically Signed   By: Marnee Spring M.D.   On: 10/28/2018 05:07   Dg Chest Port 1 View  Result Date: November 24, 2018 CLINICAL DATA:  Cough for 3 days.  Positive COVID-19 EXAM: PORTABLE CHEST 1 VIEW COMPARISON:  12/20/2017 FINDINGS: Borderline heart size. Three valves have been replaced and there is left atrial clipping. Patchy bilateral lung opacity likely related to COVID-19 positivity. No effusion or  pneumothorax. Lung volumes are low. IMPRESSION: Bilateral pneumonia in the setting of COVID-19. Electronically Signed   By: Marnee Spring M.D.   On: 11/24/18 06:35   Dg Chest Port 1v Same Day  Result Date: 11/11/2018 CLINICAL DATA:  Shortness of breath, COVID-19 positive,  hypoxia EXAM: PORTABLE CHEST 1 VIEW COMPARISON:  10/28/2018 FINDINGS: Continued worsening of severe bilateral diffuse airspace disease throughout both lungs. Suspect small posterior layering effusions. No pneumothorax. Right PICC line tip SVC RA junction. Previous cardiac valve surgery and median sternotomy. Bilateral shoulder arthroplasties noted. IMPRESSION: Worsening severe bilateral airspace disease compatible with pneumonia, edema, or ARDS. Electronically Signed   By: Judie PetitM.  Shick M.D.   On: 11/16/2018 08:37   Dg Chest Port 1v Same Day  Result Date: 10/26/2018 CLINICAL DATA:  Shortness of breath EXAM: PORTABLE CHEST 1 VIEW COMPARISON:  Yesterday FINDINGS: Improved lung volumes and mild decrease in opacity. There is still extensive patchy bilateral airspace disease in the setting of COVID-19. Densest opacity in the right upper lobe. Postoperative heart with replaced valves. No effusion or pneumothorax. IMPRESSION: Improved lung volumes and aeration, but still extensive pneumonia asymmetric to the right upper lobe. Electronically Signed   By: Marnee SpringJonathon  Watts M.D.   On: 10/26/2018 08:45   Vas [place] Lower Extremity Venous (dvt)  Result Date: 11/11/2018  Lower Venous Study Indications: Swelling.  Risk Factors: COVID. Limitations: Patient pain tolerance. Comparison Study: 10/31/18 - Negative for DVT Performing Technologist: Chanda BusingGregory Collins RVT  Examination Guidelines: A complete evaluation includes B-mode imaging, spectral Doppler, color Doppler, and power Doppler as needed of all accessible portions of each vessel. Bilateral testing is considered an integral part of a complete examination. Limited examinations for reoccurring indications may  be performed as noted.  +---------+---------------+---------+-----------+----------+-------+ RIGHT    CompressibilityPhasicitySpontaneityPropertiesSummary +---------+---------------+---------+-----------+----------+-------+ CFV      Full           Yes      Yes                          +---------+---------------+---------+-----------+----------+-------+ SFJ      Full                                                 +---------+---------------+---------+-----------+----------+-------+ FV Prox  Full                                                 +---------+---------------+---------+-----------+----------+-------+ FV Mid   Full                                                 +---------+---------------+---------+-----------+----------+-------+ FV DistalFull                                                 +---------+---------------+---------+-----------+----------+-------+ PFV      Full                                                 +---------+---------------+---------+-----------+----------+-------+ POP      Full           Yes  Yes                          +---------+---------------+---------+-----------+----------+-------+ PTV      Full                                                 +---------+---------------+---------+-----------+----------+-------+ PERO     Full                                                 +---------+---------------+---------+-----------+----------+-------+   +---------+---------------+---------+-----------+----------+-------+ LEFT     CompressibilityPhasicitySpontaneityPropertiesSummary +---------+---------------+---------+-----------+----------+-------+ CFV      Full           Yes      Yes                          +---------+---------------+---------+-----------+----------+-------+ SFJ      Full                                                  +---------+---------------+---------+-----------+----------+-------+ FV Prox  Full                                                 +---------+---------------+---------+-----------+----------+-------+ FV Mid   Full                                                 +---------+---------------+---------+-----------+----------+-------+ FV DistalFull                                                 +---------+---------------+---------+-----------+----------+-------+ PFV      Full                                                 +---------+---------------+---------+-----------+----------+-------+ POP      Full           Yes      Yes                          +---------+---------------+---------+-----------+----------+-------+ PTV      Full                                                 +---------+---------------+---------+-----------+----------+-------+ PERO     Full                                                 +---------+---------------+---------+-----------+----------+-------+  Summary: Right: There is no evidence of deep vein thrombosis in the lower extremity. No cystic structure found in the popliteal fossa. Left: There is no evidence of deep vein thrombosis in the lower extremity. No cystic structure found in the popliteal fossa.  *See table(s) above for measurements and observations.    Preliminary    Vas [place] Lower Extremity Venous (dvt)  Result Date: 10/31/2018  Lower Venous Study Indications: Covid +.  Limitations: Patient with recent leg fracture and ORIF. Comparison Study: Negative study from 10/05/2017 is available Performing Technologist: Sherren Kerns RVS  Examination Guidelines: A complete evaluation includes B-mode imaging, spectral Doppler, color Doppler, and power Doppler as needed of all accessible portions of each vessel. Bilateral testing is considered an integral part of a complete examination. Limited examinations for reoccurring indications may be  performed as noted.  +---------+---------------+---------+-----------+----------+-------+ RIGHT    CompressibilityPhasicitySpontaneityPropertiesSummary +---------+---------------+---------+-----------+----------+-------+ CFV      Full           Yes      Yes                          +---------+---------------+---------+-----------+----------+-------+ SFJ      Full                                                 +---------+---------------+---------+-----------+----------+-------+ FV Prox  Full                                                 +---------+---------------+---------+-----------+----------+-------+ FV Mid   Full                                                 +---------+---------------+---------+-----------+----------+-------+ FV DistalFull                                                 +---------+---------------+---------+-----------+----------+-------+ PFV      Full                                                 +---------+---------------+---------+-----------+----------+-------+ POP                     Yes      Yes                          +---------+---------------+---------+-----------+----------+-------+ PTV      Full                                                 +---------+---------------+---------+-----------+----------+-------+ PERO     Full                                                 +---------+---------------+---------+-----------+----------+-------+   +---------+---------------+---------+-----------+----------+-------+  LEFT     CompressibilityPhasicitySpontaneityPropertiesSummary +---------+---------------+---------+-----------+----------+-------+ CFV      Full           Yes      Yes                          +---------+---------------+---------+-----------+----------+-------+ SFJ      Full                                                  +---------+---------------+---------+-----------+----------+-------+ FV Prox  Full                                                 +---------+---------------+---------+-----------+----------+-------+ FV Mid   Full                                                 +---------+---------------+---------+-----------+----------+-------+ FV DistalFull                                                 +---------+---------------+---------+-----------+----------+-------+ PFV      Full                                                 +---------+---------------+---------+-----------+----------+-------+ POP                     Yes      Yes                          +---------+---------------+---------+-----------+----------+-------+ PTV      Full                                                 +---------+---------------+---------+-----------+----------+-------+ PERO     Full                                                 +---------+---------------+---------+-----------+----------+-------+     Summary: Right: There is no evidence of deep vein thrombosis in the lower extremity. However, portions of this examination were limited- see technologist comments above. Left: There is no evidence of deep vein thrombosis in the lower extremity. However, portions of this examination were limited- see technologist comments above.  *See table(s) above for measurements and observations. Electronically signed by Monica Martinez MD on 10/31/2018 at 2:21:27 PM.    Final    [place] Ekg Site Rite  Result Date: 10/27/2018 If [facility] image not attached, placement could not be confirmed due to current cardiac rhythm.   Microbiology Recent  Results (from the past 240 hour(s))  MRSA PCR Screening     Status: None   Collection Time: 10/28/18 11:00 AM   Specimen: Nasal Mucosa; Nasopharyngeal  Result Value Ref Range Status   MRSA by PCR NEGATIVE NEGATIVE Final    Comment:        The GeneXpert MRSA Assay  (FDA approved for NASAL specimens only), is one component of a comprehensive MRSA colonization surveillance program. It is not intended to diagnose MRSA infection nor to guide or monitor treatment for MRSA infections. Performed at [facility], [address] [place], [place] [address]     Lab Basic Metabolic Panel: Recent Labs  Lab 10/30/18 0505 10/31/18 0200 11/01/18 0500 11/02/18 0515 11/03/18 0115 11/04/18 0450 12-02-18 0522  NA 139 139 139 136 136 141 137  K 4.3 3.5 3.1* 3.6 3.4* 3.9 3.9  CL 101 99 95* 94* 95* 98 98  CO2 30 29 32 GLUCOSE 112* 110* 96 95 93 123* 129*  BUN 24* 25*  CREATININE 0.78 0.73 0.76 0.69 0.67 0.55 0.56  CALCIUM 8.2* 8.1* 8.3* 8.3* 8.4* 8.2* 8.1*  MG 2.1 2.1 2.1 2.3 2.3  --   --    Liver Function Tests: Recent Labs  Lab 11/01/18 0500 11/02/18 0515 11/03/18 0115 11/04/18 0450 Dec 02, 2018 0522  AST 26 29 32 29 27  ALT ALKPHOS 94 111 123 118 151*  BILITOT 0.5 0.9 0.7 0.7 1.2  PROT 5.3* 5.8* 5.5* 5.0* 5.3*  ALBUMIN 3.1* 3.1* 3.0* 3.0* 2.8*   No results for input(s): LIPASE, AMYLASE in the last 168 hours. No results for input(s): AMMONIA in the last 168 hours. CBC: Recent Labs  Lab 10/30/18 0505 10/31/18 0200 11/01/18 0500 11/02/18 0515 11/03/18 0115 11/03/18 0810 11/04/18 0450 2018/12/02 0522  WBC 8.5 10.6* 9.9 11.4* 10.4  --  10.0 14.5*  NEUTROABS 7.2 9.0* 8.4* 10.1* 8.8*  --   --   --   HGB 8.8* 8.9* 9.1* 10.0* 9.4*  --  9.2* 10.7*  HCT 30.2* 29.6* 30.9* 32.8* 31.3*  --  30.4* 34.3*  MCV 88.3 88.4 88.3 88.6 89.2  --  90.2 90.5  PLT 259 194 128* 77* 50* 42* 20* 31*   Cardiac Enzymes: No results for input(s): CKTOTAL, CKMB, CKMBINDEX, TROPONINI in the last 168 hours. Sepsis Labs: Recent Labs  Lab 11/02/18 0515 11/03/18 0115 11/04/18 0450 2018-12-02 0522  WBC 11.4* 10.4 10.0 14.5*    Procedures/Operations     Yoneko Talerico December 02, 2018, 2:30 PM

## 2018-11-30 NOTE — Progress Notes (Signed)
Nutrition Follow-up RD working remotely.  DOCUMENTATION CODES:   Not applicable  INTERVENTION:    Peach Boost Breeze po QID, each supplement provides 250 kcal and 9 grams of protein   NUTRITION DIAGNOSIS:   Increased nutrient needs related to acute illness(COVID) as evidenced by estimated needs. Ongoing.   GOAL:   Patient will meet greater than or equal to 90% of their needs Progressing.   MONITOR:   PO intake, Supplement acceptance, Labs, Skin  REASON FOR ASSESSMENT:   Rounds(RN reported poor PO intake)    ASSESSMENT:   68 yo female admitted with COVID-19 PNA. PMH includes deafness, CHF, HTN, HLD, IBS, A fib, recent ORIF at [facility] for R femur fx (d/c to SNF).   Pt remains in the ICU on 45 L HFNC. Per CCM pt refuses intubation and prone positioning. Pt using BiPAP at night and seems to be helping per CCM.   Per RN pt loves her "splash" boost breeze, will increase as PO intake remains inadequate. Meal Completion: 10-25%   Labs reviewed. Calcium 8.2 (L)  Medications reviewed and include calcium with vitamin D, Colace, ferrous sulfate, Solu-medrol, Miralax.  I/O net negative 12 L Per RN documentation, patient has mild edema to RLE.  NUTRITION - FOCUSED PHYSICAL EXAM:  deferred  Diet Order:   Diet Order            Diet regular Room service appropriate? Yes; Fluid consistency: Thin  Diet effective now              EDUCATION NEEDS:   No education needs have been identified at this time  Skin:  Skin Assessment: Reviewed RN Assessment(MASD to perineum)  Last BM:  7/5  Height:   Ht Readings from Last 1 Encounters:  10/14/2018 5\' 7"  (1.702 m)    Weight:   Wt Readings from Last 1 Encounters:  Nov 27, 2018 82.4 kg    Ideal Body Weight:  61.4 kg  BMI:  Body mass index is 28.45 kg/m.  Estimated Nutritional Needs:   Kcal:  5009-3818  Protein:  90-105 gm  Fluid:  >/= 1.8 L  Maylon Peppers RD, LDN, CNSC [phone] Pager [phone]  After Hours Pager

## 2018-11-30 NOTE — Progress Notes (Signed)
Bilateral lower extremity venous duplex has been completed. Preliminary results can be found in CV Proc through chart review.   11/22/2018 10:29 AM Carlos Levering RVT

## 2018-11-30 DEATH — deceased
